# Patient Record
Sex: Female | Born: 1970 | Race: White | Hispanic: No | Marital: Married | Smoking: Never smoker
Health system: Southern US, Community
[De-identification: ages and names within clinical notes are randomized; demographics above are authoritative.]

## PROBLEM LIST (undated history)

## (undated) DIAGNOSIS — C189 Malignant neoplasm of colon, unspecified: Secondary | ICD-10-CM

## (undated) DIAGNOSIS — R51 Headache: Secondary | ICD-10-CM

## (undated) DIAGNOSIS — Z973 Presence of spectacles and contact lenses: Secondary | ICD-10-CM

## (undated) DIAGNOSIS — T8859XA Other complications of anesthesia, initial encounter: Secondary | ICD-10-CM

## (undated) DIAGNOSIS — G620 Drug-induced polyneuropathy: Secondary | ICD-10-CM

## (undated) DIAGNOSIS — K219 Gastro-esophageal reflux disease without esophagitis: Secondary | ICD-10-CM

## (undated) DIAGNOSIS — T4145XA Adverse effect of unspecified anesthetic, initial encounter: Secondary | ICD-10-CM

## (undated) DIAGNOSIS — R519 Headache, unspecified: Secondary | ICD-10-CM

## (undated) DIAGNOSIS — E559 Vitamin D deficiency, unspecified: Secondary | ICD-10-CM

## (undated) DIAGNOSIS — K716 Toxic liver disease with hepatitis, not elsewhere classified: Secondary | ICD-10-CM

## (undated) DIAGNOSIS — Z9889 Other specified postprocedural states: Secondary | ICD-10-CM

## (undated) DIAGNOSIS — N182 Chronic kidney disease, stage 2 (mild): Secondary | ICD-10-CM

## (undated) DIAGNOSIS — N939 Abnormal uterine and vaginal bleeding, unspecified: Secondary | ICD-10-CM

## (undated) DIAGNOSIS — T451X5A Adverse effect of antineoplastic and immunosuppressive drugs, initial encounter: Secondary | ICD-10-CM

## (undated) DIAGNOSIS — N840 Polyp of corpus uteri: Secondary | ICD-10-CM

## (undated) DIAGNOSIS — C801 Malignant (primary) neoplasm, unspecified: Secondary | ICD-10-CM

## (undated) DIAGNOSIS — E782 Mixed hyperlipidemia: Secondary | ICD-10-CM

## (undated) DIAGNOSIS — F411 Generalized anxiety disorder: Secondary | ICD-10-CM

## (undated) DIAGNOSIS — F419 Anxiety disorder, unspecified: Secondary | ICD-10-CM

## (undated) DIAGNOSIS — R112 Nausea with vomiting, unspecified: Secondary | ICD-10-CM

## (undated) HISTORY — PX: TONSILLECTOMY: SUR1361

## (undated) HISTORY — PX: BREAST BIOPSY: SHX20

## (undated) HISTORY — DX: Mixed hyperlipidemia: E78.2

## (undated) HISTORY — DX: Hypomagnesemia: E83.42

## (undated) HISTORY — PX: KNEE ARTHROSCOPY: SUR90

## (undated) HISTORY — DX: Toxic liver disease with hepatitis, not elsewhere classified: K71.6

## (undated) HISTORY — DX: Vitamin D deficiency, unspecified: E55.9

## (undated) HISTORY — DX: Anxiety disorder, unspecified: F41.9

## (undated) HISTORY — PX: CHOLECYSTECTOMY: SHX55

---

## 1998-02-08 ENCOUNTER — Other Ambulatory Visit: Admission: RE | Admit: 1998-02-08 | Discharge: 1998-02-08 | Payer: Self-pay | Admitting: Family Medicine

## 1999-10-07 ENCOUNTER — Encounter: Payer: Self-pay | Admitting: Gastroenterology

## 1999-10-07 ENCOUNTER — Ambulatory Visit (HOSPITAL_COMMUNITY): Admission: RE | Admit: 1999-10-07 | Discharge: 1999-10-07 | Payer: Self-pay | Admitting: Gastroenterology

## 2000-07-26 ENCOUNTER — Other Ambulatory Visit: Admission: RE | Admit: 2000-07-26 | Discharge: 2000-07-26 | Payer: Self-pay | Admitting: Family Medicine

## 2001-05-15 ENCOUNTER — Encounter: Payer: Self-pay | Admitting: Gastroenterology

## 2001-05-15 ENCOUNTER — Ambulatory Visit (HOSPITAL_COMMUNITY): Admission: RE | Admit: 2001-05-15 | Discharge: 2001-05-15 | Payer: Self-pay | Admitting: Gastroenterology

## 2001-07-02 ENCOUNTER — Observation Stay (HOSPITAL_COMMUNITY): Admission: RE | Admit: 2001-07-02 | Discharge: 2001-07-03 | Payer: Self-pay | Admitting: General Surgery

## 2001-09-19 ENCOUNTER — Other Ambulatory Visit: Admission: RE | Admit: 2001-09-19 | Discharge: 2001-09-19 | Payer: Self-pay | Admitting: Obstetrics & Gynecology

## 2002-04-09 ENCOUNTER — Inpatient Hospital Stay (HOSPITAL_COMMUNITY): Admission: AD | Admit: 2002-04-09 | Discharge: 2002-04-09 | Payer: Self-pay | Admitting: Obstetrics & Gynecology

## 2002-04-09 ENCOUNTER — Encounter: Payer: Self-pay | Admitting: Obstetrics and Gynecology

## 2002-04-20 ENCOUNTER — Inpatient Hospital Stay (HOSPITAL_COMMUNITY): Admission: AD | Admit: 2002-04-20 | Discharge: 2002-04-23 | Payer: Self-pay | Admitting: Obstetrics & Gynecology

## 2002-04-24 ENCOUNTER — Encounter: Admission: RE | Admit: 2002-04-24 | Discharge: 2002-05-24 | Payer: Self-pay | Admitting: Obstetrics & Gynecology

## 2002-05-27 ENCOUNTER — Other Ambulatory Visit: Admission: RE | Admit: 2002-05-27 | Discharge: 2002-05-27 | Payer: Self-pay | Admitting: Obstetrics & Gynecology

## 2006-01-12 ENCOUNTER — Encounter: Admission: RE | Admit: 2006-01-12 | Discharge: 2006-01-12 | Payer: Self-pay | Admitting: Obstetrics & Gynecology

## 2006-01-25 ENCOUNTER — Encounter: Admission: RE | Admit: 2006-01-25 | Discharge: 2006-01-25 | Payer: Self-pay | Admitting: Obstetrics & Gynecology

## 2011-02-23 ENCOUNTER — Other Ambulatory Visit: Payer: Self-pay | Admitting: Obstetrics & Gynecology

## 2011-02-23 DIAGNOSIS — Z1231 Encounter for screening mammogram for malignant neoplasm of breast: Secondary | ICD-10-CM

## 2011-03-06 ENCOUNTER — Ambulatory Visit
Admission: RE | Admit: 2011-03-06 | Discharge: 2011-03-06 | Disposition: A | Discharge status: Home / Self Care | Payer: PRIVATE HEALTH INSURANCE | Source: Ambulatory Visit | Attending: Obstetrics & Gynecology | Admitting: Obstetrics & Gynecology

## 2011-03-06 DIAGNOSIS — Z1231 Encounter for screening mammogram for malignant neoplasm of breast: Secondary | ICD-10-CM

## 2012-03-15 ENCOUNTER — Other Ambulatory Visit: Payer: Self-pay

## 2012-03-15 DIAGNOSIS — Z1231 Encounter for screening mammogram for malignant neoplasm of breast: Secondary | ICD-10-CM

## 2012-03-19 ENCOUNTER — Ambulatory Visit: Payer: PRIVATE HEALTH INSURANCE

## 2012-04-12 ENCOUNTER — Ambulatory Visit
Admission: RE | Admit: 2012-04-12 | Discharge: 2012-04-12 | Disposition: A | Discharge status: Home / Self Care | Payer: PRIVATE HEALTH INSURANCE | Source: Ambulatory Visit

## 2012-04-12 DIAGNOSIS — Z1231 Encounter for screening mammogram for malignant neoplasm of breast: Secondary | ICD-10-CM

## 2013-03-06 ENCOUNTER — Other Ambulatory Visit: Payer: Self-pay

## 2013-03-06 DIAGNOSIS — Z1231 Encounter for screening mammogram for malignant neoplasm of breast: Secondary | ICD-10-CM

## 2013-04-14 ENCOUNTER — Ambulatory Visit
Admission: RE | Admit: 2013-04-14 | Discharge: 2013-04-14 | Disposition: A | Discharge status: Home / Self Care | Payer: PRIVATE HEALTH INSURANCE | Source: Ambulatory Visit

## 2013-04-14 DIAGNOSIS — Z1231 Encounter for screening mammogram for malignant neoplasm of breast: Secondary | ICD-10-CM

## 2014-01-02 DIAGNOSIS — C801 Malignant (primary) neoplasm, unspecified: Secondary | ICD-10-CM

## 2014-01-02 DIAGNOSIS — C184 Malignant neoplasm of transverse colon: Secondary | ICD-10-CM

## 2014-01-02 HISTORY — DX: Malignant neoplasm of transverse colon: C18.4

## 2014-01-02 HISTORY — DX: Malignant (primary) neoplasm, unspecified: C80.1

## 2014-01-02 HISTORY — PX: COLON SURGERY: SHX602

## 2014-10-19 ENCOUNTER — Other Ambulatory Visit: Payer: Self-pay

## 2014-10-19 HISTORY — PX: COLONOSCOPY: SHX174

## 2014-10-19 LAB — HM COLONOSCOPY

## 2014-10-26 ENCOUNTER — Other Ambulatory Visit (HOSPITAL_COMMUNITY): Payer: Self-pay | Admitting: Gastroenterology

## 2014-10-26 DIAGNOSIS — C184 Malignant neoplasm of transverse colon: Secondary | ICD-10-CM

## 2014-10-30 ENCOUNTER — Encounter (HOSPITAL_COMMUNITY)
Admission: RE | Admit: 2014-10-30 | Discharge: 2014-10-30 | Disposition: A | Discharge status: Home / Self Care | Payer: PRIVATE HEALTH INSURANCE | Source: Ambulatory Visit | Attending: Gastroenterology | Admitting: Gastroenterology

## 2014-10-30 DIAGNOSIS — C184 Malignant neoplasm of transverse colon: Secondary | ICD-10-CM | POA: Insufficient documentation

## 2014-10-30 LAB — GLUCOSE, CAPILLARY: Glucose-Capillary: 101 mg/dL — ABNORMAL HIGH (ref 65–99)

## 2014-10-30 MED ORDER — FLUDEOXYGLUCOSE F - 18 (FDG) INJECTION
10.5000 | Freq: Once | INTRAVENOUS | Status: DC | PRN
  Administered 2014-10-30: 10.5 via INTRAVENOUS
  Filled 2014-10-30: qty 10.5

## 2014-11-04 ENCOUNTER — Ambulatory Visit (HOSPITAL_COMMUNITY): Payer: PRIVATE HEALTH INSURANCE

## 2014-11-08 DIAGNOSIS — C189 Malignant neoplasm of colon, unspecified: Secondary | ICD-10-CM | POA: Insufficient documentation

## 2014-11-16 HISTORY — PX: COLON SURGERY: SHX602

## 2014-12-11 ENCOUNTER — Ambulatory Visit (HOSPITAL_BASED_OUTPATIENT_CLINIC_OR_DEPARTMENT_OTHER): Payer: PRIVATE HEALTH INSURANCE

## 2014-12-11 ENCOUNTER — Encounter: Payer: Self-pay | Admitting: *Deleted

## 2014-12-11 ENCOUNTER — Ambulatory Visit (HOSPITAL_BASED_OUTPATIENT_CLINIC_OR_DEPARTMENT_OTHER): Payer: PRIVATE HEALTH INSURANCE | Admitting: Oncology

## 2014-12-11 ENCOUNTER — Encounter: Payer: Self-pay | Admitting: Oncology

## 2014-12-11 ENCOUNTER — Telehealth: Payer: Self-pay | Admitting: Oncology

## 2014-12-11 VITALS — BP 133/75 | HR 77 | Temp 98.8°F | Resp 16 | Ht 65.0 in | Wt 216.9 lb

## 2014-12-11 DIAGNOSIS — D509 Iron deficiency anemia, unspecified: Secondary | ICD-10-CM

## 2014-12-11 DIAGNOSIS — Z803 Family history of malignant neoplasm of breast: Secondary | ICD-10-CM | POA: Diagnosis not present

## 2014-12-11 DIAGNOSIS — C779 Secondary and unspecified malignant neoplasm of lymph node, unspecified: Secondary | ICD-10-CM

## 2014-12-11 DIAGNOSIS — C184 Malignant neoplasm of transverse colon: Secondary | ICD-10-CM | POA: Diagnosis not present

## 2014-12-11 DIAGNOSIS — Z808 Family history of malignant neoplasm of other organs or systems: Secondary | ICD-10-CM

## 2014-12-11 DIAGNOSIS — C189 Malignant neoplasm of colon, unspecified: Secondary | ICD-10-CM | POA: Insufficient documentation

## 2014-12-11 LAB — CBC WITH DIFFERENTIAL/PLATELET
BASO%: 0.6 % (ref 0.0–2.0)
Basophils Absolute: 0 10*3/uL (ref 0.0–0.1)
EOS ABS: 0.3 10*3/uL (ref 0.0–0.5)
EOS%: 5.7 % (ref 0.0–7.0)
HEMATOCRIT: 40.7 % (ref 34.8–46.6)
HEMOGLOBIN: 12.9 g/dL (ref 11.6–15.9)
LYMPH#: 2.3 10*3/uL (ref 0.9–3.3)
LYMPH%: 41.3 % (ref 14.0–49.7)
MCH: 25.7 pg (ref 25.1–34.0)
MCHC: 31.7 g/dL (ref 31.5–36.0)
MCV: 81.2 fL (ref 79.5–101.0)
MONO#: 0.5 10*3/uL (ref 0.1–0.9)
MONO%: 8.8 % (ref 0.0–14.0)
NEUT%: 43.6 % (ref 38.4–76.8)
NEUTROS ABS: 2.4 10*3/uL (ref 1.5–6.5)
PLATELETS: 279 10*3/uL (ref 145–400)
RBC: 5.01 10*6/uL (ref 3.70–5.45)
RDW: 13.9 % (ref 11.2–14.5)
WBC: 5.5 10*3/uL (ref 3.9–10.3)

## 2014-12-11 LAB — COMPREHENSIVE METABOLIC PANEL
ALBUMIN: 3.9 g/dL (ref 3.5–5.0)
ALT: 15 U/L (ref 0–55)
ANION GAP: 13 meq/L — AB (ref 3–11)
AST: 16 U/L (ref 5–34)
Alkaline Phosphatase: 89 U/L (ref 40–150)
BILIRUBIN TOTAL: 0.32 mg/dL (ref 0.20–1.20)
BUN: 6.8 mg/dL — ABNORMAL LOW (ref 7.0–26.0)
CHLORIDE: 108 meq/L (ref 98–109)
CO2: 21 mEq/L — ABNORMAL LOW (ref 22–29)
CREATININE: 1 mg/dL (ref 0.6–1.1)
Calcium: 9.6 mg/dL (ref 8.4–10.4)
EGFR: 73 mL/min/{1.73_m2} — AB (ref >90)
Glucose: 113 mg/dl (ref 70–140)
Potassium: 4.1 mEq/L (ref 3.5–5.1)
Sodium: 142 mEq/L (ref 136–145)
TOTAL PROTEIN: 7.9 g/dL (ref 6.4–8.3)

## 2014-12-11 NOTE — Progress Notes (Signed)
[place] New Patient Consult   Referring MD: Lenise Arena   Brenda Becker 44 y.o.  [birth year]    Reason for Referral: Colon cancer   HPI: She reports being diagnosed with anemia at the time of a company wellness check in May of this year. She was referred to her gynecologist and the anemia improved with iron therapy. She was found to have a Hemoccult positive stool at another wellness check and was referred to Dr. Lyndel Safe. A colonoscopy revealed a mass in the mid transverse colon (we do not have the colonoscopy report available today). The pathology from a biopsy ([id number]) revealed ulcerated colonic mucosa with at least intramucosal adenocarcinoma. She underwent CT scans at [facility] ( I do not have these reports available today) and a PET scan 10/30/2014 at [facility] confirmed an annular hypermetabolic mass in the transverse colon consistent with a known primary colon cancer. Mild adjacent pericolonic lymphadenopathy was seen with hypermetabolism consistent with lymph node metastases. A right uterine fibroid had mild metabolic activity. No evidence of distant metastatic disease.  She was referred to Dr. Morton Stall at [facility] and was taken to the operating room 11/16/2014. A bulky tumor was noted just distal to the mid point of the transverse colon with bulky lymph nodes in the mesentery. A plaque-like area in the peritoneum was adjacent to a hard lymph node felt to potentially represent a lymph node breaking through the visceral peritoneum overlying the mesentery. Anastomosis was created between the proximal transverse and sigmoid colon.  The pathology at [facility] [phone]) confirmed a well-differentiated adenocarcinoma extending into the pericolonic fat. Tumor extended to within 0.1 cm of the serosa. Resection margins were negative. Tenderness 17 lymph nodes are positive for metastatic adenocarcinoma. The separate sutured serosal area identified at the time of  surgery represented a metastatic lymph node. Lymphovascular and perineural invasion were not identified. Tumor deposits were present.  Her bowels are functioning following surgery. She has noted constipation since beginning iron therapy.  Past medical history: 1. G2 P1, 1 miscarriage   Past surgical history: 1. Cholecystectomy age 11 2. Tonsillectomy age 62 3.  Right knee arthroscopy for a meniscus tear   Medications: Reviewed  Allergies: Not on File  Family history: Her paternal grandmother had breast cancer. A paternal aunt had head and neck cancer. A paternal aunt had ovarian cancer in her 50s. No other family history of cancer.  Social History:   She lives with her husband and daughter in [place]. She works as a Engineer, building services. She does not use tobacco or alcohol. No transfusion history. No risk factor for HIV or hepatitis.     ROS:   Positives include: Blood in the stool on one occasion prior to the colonoscopy, exertional dyspnea when going up steps at work prior to being diagnosed with colon cancer  A complete ROS was otherwise negative.  Physical Exam:  Blood pressure 133/75, pulse 77, temperature 98.8 F (37.1 C), temperature source Oral, resp. rate 16, height _0  (1.651 m), weight 216 lb 14.4 oz (98.385 kg), SpO2 100 %.  HEENT: Oropharynx without visible mass, neck without mass Lungs: Clear bilaterally Cardiac: Regular rate and rhythm Abdomen: No hepatosplenomegaly, no mass, nontender, healed surgical incision  Vascular: No leg edema Lymph nodes: No cervical, supraclavicular, axillary, or inguinal nodes Neurologic: Alert and oriented, the motor exam appears intact in the upper and lower extremities Skin: No rash, multiple tattoos Musculoskeletal: No spine tenderness   LAB:  CBC  Lab Results  Component Value Date   WBC 5.5 12/11/2014   HGB 12.9 12/11/2014   HCT 40.7 12/11/2014   MCV 81.2 12/11/2014   PLT 279 12/11/2014   NEUTROABS 2.4 12/11/2014         Imaging:  As per history of present illness, PET scan from 10/30/2014 reviewed   Assessment/Plan:   1. 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   Microcytic anemia-likely iron deficiency anemia secondary to #1 and ongoing menses  3.   Status post cholecystectomy  4.    Family history of breast and ovarian cancer   Disposition:   Brenda Becker has been diagnosed with stage III colon cancer. I discussed the prognosis and reviewed the details of the surgical pathology report with her today. She understands the colon surgery may have been curative, but she is at high risk of developing recurrent colon cancer over the next few years. I explained the data supporting adjuvant chemotherapy in this setting. I recommend adjuvant 5-fluorouracil and oxaliplatin chemotherapy.  We reviewed the treatment schedule and toxicities associated with the FOLFOX and CAPOX regimens. She feels most comfortable with CAPOX. I reviewed the potential toxicities associated with this regimen including the chance for nausea/vomiting, mucositis, diarrhea, an allergic reaction, and hematologic toxicity. We discussed the sun sensitivity, hyperpigmentation, and hand/foot syndrome associated with capecitabine. We reviewed the various types of neuropathy seen with oxaliplatin. She agrees to proceed.  Brenda Becker will be referred for placement of a Port-A-Cath prior to beginning a first cycle of CAPOX on 12/25/2014. She will return for an office visit and CBC on 01/07/2014. We obtained a baseline CEA today.  She will be referred to the genetics screening clinic. We will request MSI and mismatch repair protein IHC testing on the resected colon cancer.  We discussed diet and exercise  maneuvers that may decrease the chance of developing colon cancer. We also discussed preliminary data suggesting a benefit for the use of aspirin therapy in patients with resected colon cancer, though this is not yet a standard recommendation.    Approximately 50 minutes were spent with the patient today. The majority of the time was used for counseling and coordination of care.  [place], [place] 12/11/2014, 4:20 PM

## 2014-12-11 NOTE — Telephone Encounter (Signed)
Gave and pritned appt sched and avs for pt for DEc and Jan

## 2014-12-11 NOTE — Progress Notes (Signed)
Oncology Nurse Navigator Documentation  Oncology Nurse Navigator Flowsheets 12/11/2014  Referral date to RadOnc/MedOnc 12/11/2014  Navigator Encounter Type Initial MedOnc  Patient Visit Type Medonc  Treatment Phase Treatment  Barriers/Navigation Needs Education  Education Understanding Cancer/ Treatment Options;Preparing for Upcoming Surgery/ Treatment;Newly Diagnosed Cancer Education  Interventions Education Method  Education Method Verbal;Written  Support Groups/Services GI;Other  Time Spent with Patient 15   Met with patient and her sister briefly after her  new patient visit. Explained the role of the GI Nurse Navigator and provided New Patient Packet with information on: 1. Colon cancer and PAC  2. Support groups 3. Advanced Directives 4. Fall Safety Plan Answered questions, reviewed current treatment plan using TEACH back and provided emotional support. Went over her CBC results with her and instructed her per Dr. Sherrill to continue her ferrous sulfate daily till she gets tx started. She is recovering, but he wants her on it longer. Encouraged her to call with any questions or concerns she may have.  Susan Coward, RN, BSN GI Oncology Navigator [facility] 

## 2014-12-12 LAB — CEA

## 2014-12-14 ENCOUNTER — Other Ambulatory Visit: Payer: Self-pay | Admitting: Oncology

## 2014-12-14 MED ORDER — CAPECITABINE 500 MG PO TABS: 1000.0000 mg/m2 | ORAL_TABLET | Freq: Two times a day (BID) | ORAL | Status: DC

## 2014-12-15 ENCOUNTER — Encounter: Payer: Self-pay | Admitting: *Deleted

## 2014-12-15 NOTE — Progress Notes (Signed)
Faxed request to [facility] [phone] requesting MSI/IHC testing on case #[id number], dated 11/20/14 at request of Dr. [name]

## 2014-12-17 ENCOUNTER — Encounter: Payer: Self-pay | Admitting: *Deleted

## 2014-12-17 ENCOUNTER — Telehealth: Payer: Self-pay | Admitting: *Deleted

## 2014-12-17 ENCOUNTER — Telehealth: Payer: Self-pay | Admitting: Pharmacist

## 2014-12-17 ENCOUNTER — Other Ambulatory Visit: Payer: Self-pay | Admitting: Oncology

## 2014-12-17 ENCOUNTER — Ambulatory Visit: Payer: PRIVATE HEALTH INSURANCE

## 2014-12-17 ENCOUNTER — Encounter: Payer: Self-pay | Admitting: Pharmacist

## 2014-12-17 MED ORDER — CAPECITABINE 500 MG PO TABS: 1000.0000 mg/m2 | ORAL_TABLET | Freq: Two times a day (BID) | ORAL | Status: DC

## 2014-12-17 NOTE — Telephone Encounter (Signed)
Tc to [place] to give them ICD 10 code for filling Xeloda.

## 2014-12-17 NOTE — Progress Notes (Signed)
Consent for xeloda use signed in education class.

## 2014-12-17 NOTE — Progress Notes (Signed)
Oral Chemotherapy Pharmacist Encounter   I spoke with patient for overview of new oral chemotherapy medication: Xeloda. Pt is doing well. The prescriptions have been sent to the [facility] for benefit analysis and approval.   Counseled patient on administration, dosing, side effects, safe handling, and monitoring. Side effects include but not limited to: Fatigue, diarrhea, mouth sores, and skin issues such as hand/foot syndrome.  Brenda Becker voiced understanding and appreciation.   All questions answered.  Will follow up with patient regarding insurance and pharmacy. Will follow up in 1-2 weeks for adherence and toxicity management.   Thank you,  Montel Clock, PharmD, [facility]

## 2014-12-17 NOTE — Telephone Encounter (Signed)
12/15 - Xeloda Rx requires specialty pharmacy per Insurance. Faxed to [facility].

## 2014-12-18 ENCOUNTER — Other Ambulatory Visit: Payer: Self-pay | Admitting: Radiology

## 2014-12-18 ENCOUNTER — Telehealth: Payer: Self-pay | Admitting: *Deleted

## 2014-12-18 DIAGNOSIS — C184 Malignant neoplasm of transverse colon: Secondary | ICD-10-CM

## 2014-12-18 MED ORDER — LIDOCAINE-PRILOCAINE 2.5-2.5 % EX CREA: TOPICAL_CREAM | CUTANEOUS | Status: DC

## 2014-12-18 MED ORDER — PROCHLORPERAZINE MALEATE 10 MG PO TABS: 10.0000 mg | ORAL_TABLET | Freq: Four times a day (QID) | ORAL | Status: DC | PRN

## 2014-12-18 NOTE — Telephone Encounter (Signed)
Left message on voicemail for pt to call office to review medications: Pt should discontinue Prilosec while on Xeloda. OK to take Tums, Zantac or Pepcid PRN.  Dr. [name] ordered Compazine PRN for nausea. EMLA cream for port. Prescriptions e-scribed to local pharmacy.

## 2014-12-21 ENCOUNTER — Encounter (HOSPITAL_COMMUNITY): Payer: Self-pay

## 2014-12-21 ENCOUNTER — Other Ambulatory Visit: Payer: Self-pay | Admitting: Radiology

## 2014-12-21 ENCOUNTER — Ambulatory Visit (HOSPITAL_COMMUNITY)
Admission: RE | Admit: 2014-12-21 | Discharge: 2014-12-21 | Disposition: A | Discharge status: Home / Self Care | Payer: PRIVATE HEALTH INSURANCE | Source: Ambulatory Visit | Attending: Oncology | Admitting: Oncology

## 2014-12-21 DIAGNOSIS — Z538 Procedure and treatment not carried out for other reasons: Secondary | ICD-10-CM | POA: Insufficient documentation

## 2014-12-21 DIAGNOSIS — C184 Malignant neoplasm of transverse colon: Secondary | ICD-10-CM

## 2014-12-21 HISTORY — DX: Malignant (primary) neoplasm, unspecified: C80.1

## 2014-12-21 MED ORDER — SODIUM CHLORIDE 0.9 % IV SOLN
INTRAVENOUS | Status: DC
Start: 2014-12-21 — End: 2014-12-22

## 2014-12-21 MED ORDER — CEFAZOLIN SODIUM-DEXTROSE 2-3 GM-% IV SOLR: 2.0000 g | Freq: Once | INTRAVENOUS | Status: DC

## 2014-12-21 NOTE — Telephone Encounter (Signed)
Pt confirmed she did receive information re: medication.  "Brenda Becker spoke to me in chemo class about stopping Prilosec"  Pt has also p/u compazine and emla cream "just waiting to hear about the chemo pill"  Pt verbalized understanding to call if any questions.

## 2014-12-21 NOTE — Progress Notes (Signed)
Due to emergent add on cases with over 2 hour delay in schedule, the patient was given the option to reschedule her port a catheter placement for 12/20 at 9 am, she would like to reschedule. She was given NPO instructions and will arrive at 7 am for 9 am port placement procedure.    Tsosie Billing PA-C Interventional Radiology  12/21/14  1:43 PM

## 2014-12-22 ENCOUNTER — Encounter: Payer: Self-pay | Admitting: Oncology

## 2014-12-22 ENCOUNTER — Other Ambulatory Visit: Payer: Self-pay | Admitting: Oncology

## 2014-12-22 ENCOUNTER — Encounter (HOSPITAL_COMMUNITY): Payer: Self-pay

## 2014-12-22 ENCOUNTER — Ambulatory Visit (HOSPITAL_COMMUNITY)
Admission: RE | Admit: 2014-12-22 | Discharge: 2014-12-22 | Disposition: A | Discharge status: Home / Self Care | Payer: PRIVATE HEALTH INSURANCE | Source: Ambulatory Visit | Attending: Oncology | Admitting: Oncology

## 2014-12-22 DIAGNOSIS — Z79899 Other long term (current) drug therapy: Secondary | ICD-10-CM | POA: Diagnosis not present

## 2014-12-22 DIAGNOSIS — C184 Malignant neoplasm of transverse colon: Secondary | ICD-10-CM

## 2014-12-22 DIAGNOSIS — C189 Malignant neoplasm of colon, unspecified: Secondary | ICD-10-CM | POA: Insufficient documentation

## 2014-12-22 HISTORY — DX: Adverse effect of unspecified anesthetic, initial encounter: T41.45XA

## 2014-12-22 HISTORY — DX: Other specified postprocedural states: R11.2

## 2014-12-22 HISTORY — DX: Other specified postprocedural states: Z98.890

## 2014-12-22 HISTORY — PX: PORTA CATH INSERTION: CATH118285

## 2014-12-22 HISTORY — DX: Other complications of anesthesia, initial encounter: T88.59XA

## 2014-12-22 LAB — CBC WITH DIFFERENTIAL/PLATELET
BASOS ABS: 0 10*3/uL (ref 0.0–0.1)
Basophils Relative: 1 %
Eosinophils Absolute: 0.3 10*3/uL (ref 0.0–0.7)
Eosinophils Relative: 5 %
HCT: 42.1 % (ref 36.0–46.0)
HEMOGLOBIN: 13.5 g/dL (ref 12.0–15.0)
LYMPHS ABS: 2.5 10*3/uL (ref 0.7–4.0)
LYMPHS PCT: 39 %
MCH: 25.9 pg — AB (ref 26.0–34.0)
MCHC: 32.1 g/dL (ref 30.0–36.0)
MCV: 80.7 fL (ref 78.0–100.0)
Monocytes Absolute: 0.6 10*3/uL (ref 0.1–1.0)
Monocytes Relative: 9 %
NEUTROS PCT: 46 %
Neutro Abs: 2.9 10*3/uL (ref 1.7–7.7)
PLATELETS: 251 10*3/uL (ref 150–400)
RBC: 5.22 MIL/uL — AB (ref 3.87–5.11)
RDW: 14.2 % (ref 11.5–15.5)
WBC: 6.3 10*3/uL (ref 4.0–10.5)

## 2014-12-22 LAB — PROTIME-INR
INR: 0.94 (ref 0.00–1.49)
PROTHROMBIN TIME: 12.8 s (ref 11.6–15.2)

## 2014-12-22 MED ORDER — CEFAZOLIN SODIUM-DEXTROSE 2-3 GM-% IV SOLR
2.0000 g | Freq: Once | INTRAVENOUS | Status: AC
  Administered 2014-12-22: 2 g via INTRAVENOUS

## 2014-12-22 MED ORDER — MIDAZOLAM HCL 2 MG/2ML IJ SOLN
INTRAMUSCULAR | Status: AC | PRN
  Administered 2014-12-22 (×4): 1 mg via INTRAVENOUS

## 2014-12-22 MED ORDER — HEPARIN SOD (PORK) LOCK FLUSH 100 UNIT/ML IV SOLN
INTRAVENOUS | Status: AC
  Filled 2014-12-22: qty 5

## 2014-12-22 MED ORDER — LIDOCAINE-EPINEPHRINE 2 %-1:100000 IJ SOLN
INTRAMUSCULAR | Status: AC
  Filled 2014-12-22: qty 1

## 2014-12-22 MED ORDER — FENTANYL CITRATE (PF) 100 MCG/2ML IJ SOLN
INTRAMUSCULAR | Status: AC
  Filled 2014-12-22: qty 2

## 2014-12-22 MED ORDER — SODIUM CHLORIDE 0.9 % IV SOLN
INTRAVENOUS | Status: DC
  Administered 2014-12-22: 08:00:00 via INTRAVENOUS

## 2014-12-22 MED ORDER — FENTANYL CITRATE (PF) 100 MCG/2ML IJ SOLN
INTRAMUSCULAR | Status: AC | PRN
  Administered 2014-12-22: 25 ug via INTRAVENOUS
  Administered 2014-12-22: 50 ug via INTRAVENOUS
  Administered 2014-12-22: 25 ug via INTRAVENOUS

## 2014-12-22 MED ORDER — MIDAZOLAM HCL 2 MG/2ML IJ SOLN
INTRAMUSCULAR | Status: AC
  Filled 2014-12-22: qty 4

## 2014-12-22 MED ORDER — CEFAZOLIN SODIUM-DEXTROSE 2-3 GM-% IV SOLR
INTRAVENOUS | Status: AC
  Filled 2014-12-22: qty 50

## 2014-12-22 MED ORDER — HEPARIN SOD (PORK) LOCK FLUSH 100 UNIT/ML IV SOLN
INTRAVENOUS | Status: AC | PRN
  Administered 2014-12-22: 500 [IU]

## 2014-12-22 NOTE — H&P (Signed)
Chief Complaint: Patient was seen in consultation today for port placement at the request of Sherrill,Gary B  Referring Physician(s): Sherrill,Gary B  History of Present Illness: Brenda Becker is a 44 y.o. female with colon cancer. She is set to start chemotherapy later this week and is scheduled for port placement today PMHx, meds, labs, allergies reviewed. Has been NPO this morning Feels well, no recent fevers chills, illness  Past Medical History  Diagnosis Date  . Cancer ([place])     colon ca  . Complication of anesthesia     N/V  . PONV (postoperative nausea and vomiting)     vomit with Gallbladder surgery    Past Surgical History  Procedure Laterality Date  . Tonsillectomy    . Colon surgery    . Cholecystectomy    . Knee arthroscopy Right     Allergies: Review of patient's allergies indicates no known allergies.  Medications: Prior to Admission medications   Medication Sig Start Date End Date Taking? Authorizing Provider  docusate sodium (COLACE) 100 MG capsule Take 100 mg by mouth daily as needed for mild constipation.   Yes Historical Provider, MD  norethindrone-ethinyl estradiol (JUNEL FE,GILDESS FE,LOESTRIN FE) 1-20 MG-MCG tablet Take 1 tablet by mouth daily.   Yes Historical Provider, MD  polyethylene glycol (MIRALAX / GLYCOLAX) packet Take 17 g by mouth as needed. 11/21/14  Yes Historical Provider, MD  atorvastatin (LIPITOR) 20 MG tablet Take 20 mg by mouth. At bettime    Historical Provider, MD  capecitabine (XELODA) 500 MG tablet Take 4 tablets (2,000 mg total) by mouth 2 (two) times daily after a meal. Take for 14 days then off for 7 days. Repeat every 21 days 12/25/14   Ladell Pier, MD  ferrous sulfate 325 (65 FE) MG tablet Take 325 mg by mouth. 3 times a week    Historical Provider, MD  lidocaine-prilocaine (EMLA) cream Apply to port site one hour prior to use. Do not rub in. Cover with plastic. 12/18/14   Ladell Pier, MD  prochlorperazine  (COMPAZINE) 10 MG tablet Take 1 tablet (10 mg total) by mouth every 6 (six) hours as needed for nausea or vomiting. 12/18/14   Ladell Pier, MD  ranitidine (ZANTAC) 150 MG capsule Take 150 mg by mouth 1 day or 1 dose.    Historical Provider, MD     History reviewed. No pertinent family history.  Social History   Social History  . Marital Status: Married    Spouse Name: N/A  . Number of Children: N/A  . Years of Education: N/A   Social History Main Topics  . Smoking status: Never Smoker   . Smokeless tobacco: None  . Alcohol Use: No  . Drug Use: None  . Sexual Activity: Not Asked   Other Topics Concern  . None   Social History Narrative     Review of Systems: A 12 point ROS discussed and pertinent positives are indicated in the HPI above.  All other systems are negative.  Review of Systems  Vital Signs: BP 134/89 mmHg  Pulse 73  Temp(Src) 99.1 F (37.3 C) (Oral)  Resp 16  SpO2 100%  LMP 11/26/2014  Physical Exam  Constitutional: She is oriented to person, place, and time. She appears well-developed and well-nourished. No distress.  HENT:  Head: Normocephalic.  Mouth/Throat: Oropharynx is clear and moist.  Neck: Normal range of motion. No tracheal deviation present.  Cardiovascular: Normal rate, regular rhythm and normal heart  sounds.   Pulmonary/Chest: Effort normal and breath sounds normal. No respiratory distress.  Abdominal: Soft. She exhibits no distension. There is no tenderness.  Neurological: She is alert and oriented to person, place, and time.  Psychiatric: She has a normal mood and affect. Judgment normal.    Mallampati Score:  MD Evaluation Airway: WNL Heart: WNL Abdomen: WNL Chest/ Lungs: WNL ASA  Classification: 2 Mallampati/Airway Score: One  Imaging: No results found.  Labs:  CBC:  Recent Labs  12/11/14 1558 12/22/14 0725  WBC 5.5 6.3  HGB 12.9 13.5  HCT 40.7 42.1  PLT 279 251    COAGS:  Recent Labs  12/22/14 0725    INR 0.94    BMP:  Recent Labs  12/11/14 1558  NA 142  K 4.1  CO2 21*  GLUCOSE 113  BUN 6.8*  CALCIUM 9.6  CREATININE 1.0    LIVER FUNCTION TESTS:  Recent Labs  12/11/14 1558  BILITOT 0.32  AST 16  ALT 15  ALKPHOS 89  PROT 7.9  ALBUMIN 3.9    TUMOR MARKERS:  Recent Labs  12/11/14 1557  CEA <0.5    Assessment and Plan: Colon cancer For Port placement Labs reviewed, ok Risks and Benefits discussed with the patient including, but not limited to bleeding, infection, pneumothorax, or fibrin sheath development and need for additional procedures. All of the patient's questions were answered, patient is agreeable to proceed. Consent signed and in chart.    Thank you for this interesting consult.  A copy of this report was sent to the requesting provider on this date.  SignedAscencion Dike 12/22/2014, 8:23 AM   I spent a total of 18 minutes in face to face in clinical consultation, greater than 50% of which was counseling/coordinating care for port palcement       \

## 2014-12-22 NOTE — Progress Notes (Signed)
Called pt. To advise FMLA paperwork ready for pick-up, no answer left message to call me back. Paperwork will be in the front lobby. Forwarded to medical records.

## 2014-12-22 NOTE — Progress Notes (Signed)
Faxed xeloda pa form to [facility] @ [phone] phone # [phone]

## 2014-12-22 NOTE — Progress Notes (Signed)
Pt actually discharged at 91 from short stay rm 11with husband driving.

## 2014-12-22 NOTE — Procedures (Signed)
R IJ Port cathter placement with US and fluoroscopy No complication No blood loss. See complete dictation in [facility] PACS.  

## 2014-12-22 NOTE — Discharge Instructions (Signed)
Moderate Conscious Sedation, Adult Sedation is the use of medicines to promote relaxation and relieve discomfort and anxiety. Moderate conscious sedation is a type of sedation. Under moderate conscious sedation you are less alert than normal but are still able to respond to instructions or stimulation. Moderate conscious sedation is used during short medical and dental procedures. It is milder than deep sedation or general anesthesia and allows you to return to your regular activities sooner. LET [facility] CARE PROVIDER KNOW ABOUT:   Any allergies you have.  All medicines you are taking, including vitamins, herbs, eye drops, creams, and over-the-counter medicines.  Use of steroids (by mouth or creams).  Previous problems you or members of your family have had with the use of anesthetics.  Any blood disorders you have.  Previous surgeries you have had.  Medical conditions you have.  Possibility of pregnancy, if this applies.  Use of cigarettes, alcohol, or illegal drugs. RISKS AND COMPLICATIONS Generally, this is a safe procedure. However, as with any procedure, problems can occur. Possible problems include:  Oversedation.  Trouble breathing on your own. You may need to have a breathing tube until you are awake and breathing on your own.  Allergic reaction to any of the medicines used for the procedure. BEFORE THE PROCEDURE  You may have blood tests done. These tests can help show how well your kidneys and liver are working. They can also show how well your blood clots.  A physical exam will be done.  Only take medicines as directed by your health care provider. You may need to stop taking medicines (such as blood thinners, aspirin, or nonsteroidal anti-inflammatory drugs) before the procedure.   Do not eat or drink at least 6 hours before the procedure or as directed by your health care provider.  Arrange for a responsible adult, family member, or friend to take you home  after the procedure. He or she should stay with you for at least 24 hours after the procedure, until the medicine has worn off. PROCEDURE   An intravenous (IV) catheter will be inserted into one of your veins. Medicine will be able to flow directly into your body through this catheter. You may be given medicine through this tube to help prevent pain and help you relax.  The medical or dental procedure will be done. AFTER THE PROCEDURE  You will stay in a recovery area until the medicine has worn off. Your blood pressure and pulse will be checked.   Depending on the procedure you had, you may be allowed to go home when you can tolerate liquids and your pain is under control.   This information is not intended to replace advice given to you by your health care provider. Make sure you discuss any questions you have with your health care provider.   Document Released: 09/13/2000 Document Revised: 01/09/2014 Document Reviewed: 08/26/2012 Elsevier Interactive Patient Education 2016 [employer]  Moderate Conscious Sedation, Adult, Care After Refer to this sheet in the next few weeks. These instructions provide you with information on caring for yourself after your procedure. Your health care provider may also give you more specific instructions. Your treatment has been planned according to current medical practices, but problems sometimes occur. Call your health care provider if you have any problems or questions after your procedure. WHAT TO EXPECT AFTER THE PROCEDURE  After your procedure:  You may feel sleepy, clumsy, and have poor balance for several hours.  Vomiting may occur if you eat too  soon after the procedure. HOME CARE INSTRUCTIONS  Do not participate in any activities where you could become injured for at least 24 hours. Do not:  Drive.  Swim.  Ride a bicycle.  Operate heavy machinery.  Cook.  Use power tools.  Climb ladders.  Work from a high place.  Do not  make important decisions or sign legal documents until you are improved.  If you vomit, drink water, juice, or soup when you can drink without vomiting. Make sure you have little or no nausea before eating solid foods.  Only take over-the-counter or prescription medicines for pain, discomfort, or fever as directed by your health care provider.  Make sure you and your family fully understand everything about the medicines given to you, including what side effects may occur.  You should not drink alcohol, take sleeping pills, or take medicines that cause drowsiness for at least 24 hours.  If you smoke, do not smoke without supervision.  If you are feeling better, you may resume normal activities 24 hours after you were sedated.  Keep all appointments with your health care provider. SEEK MEDICAL CARE IF:  Your skin is pale or bluish in color.  You continue to feel nauseous or vomit.  Your pain is getting worse and is not helped by medicine.  You have bleeding or swelling.  You are still sleepy or feeling clumsy after 24 hours. SEEK IMMEDIATE MEDICAL CARE IF:  You develop a rash.  You have difficulty breathing.  You develop any type of allergic problem.  You have a fever. MAKE SURE YOU:  Understand these instructions.  Will watch your condition.  Will get help right away if you are not doing well or get worse.   This information is not intended to replace advice given to you by your health care provider. Make sure you discuss any questions you have with your health care provider.   Document Released: 10/09/2012 Document Revised: 01/09/2014 Document Reviewed: 10/09/2012 Elsevier Interactive Patient Education 2016 [place] Insertion, Care After Refer to this sheet in the next few weeks. These instructions provide you with information on caring for yourself after your procedure. Your health care provider may also give you more specific instructions.  Your treatment has been planned according to current medical practices, but problems sometimes occur. Call your health care provider if you have any problems or questions after your procedure. WHAT TO EXPECT AFTER THE PROCEDURE After your procedure, it is typical to have the following:   Discomfort at the port insertion site. Ice packs to the area will help.  Bruising on the skin over the port. This will subside in 3-4 days. HOME CARE INSTRUCTIONS  After your port is placed, you will get a manufacturer's information card. The card has information about your port. Keep this card with you at all times.   Know what kind of port you have. There are many types of ports available.   Wear a medical alert bracelet in case of an emergency. This can help alert health care workers that you have a port.   The port can stay in for as long as your health care provider believes it is necessary.   A home health care nurse may give medicines and take care of the port.   You or a family member can get special training and directions for giving medicine and taking care of the port at home.  SEEK MEDICAL CARE IF:   Your port does not  flush or you are unable to get a blood return.   You have a fever or chills. SEEK IMMEDIATE MEDICAL CARE IF:  You have new fluid or pus coming from your incision.   You notice a bad smell coming from your incision site.   You have swelling, pain, or more redness at the incision or port site.   You have chest pain or shortness of breath.   This information is not intended to replace advice given to you by your health care provider. Make sure you discuss any questions you have with your health care provider.   Document Released: 10/09/2012 Document Revised: 12/24/2012 Document Reviewed: 10/09/2012 Elsevier Interactive Patient Education 2016 [place] An implanted port is a type of central line that is placed under the skin.  Central lines are used to provide IV access when treatment or nutrition needs to be given through a person's veins. Implanted ports are used for long-term IV access. An implanted port may be placed because:   You need IV medicine that would be irritating to the small veins in your hands or arms.   You need long-term IV medicines, such as antibiotics.   You need IV nutrition for a long period.   You need frequent blood draws for lab tests.   You need dialysis.  Implanted ports are usually placed in the chest area, but they can also be placed in the upper arm, the abdomen, or the leg. An implanted port has two main parts:   Reservoir. The reservoir is round and will appear as a small, raised area under your skin. The reservoir is the part where a needle is inserted to give medicines or draw blood.   Catheter. The catheter is a thin, flexible tube that extends from the reservoir. The catheter is placed into a large vein. Medicine that is inserted into the reservoir goes into the catheter and then into the vein.  HOW WILL I CARE FOR MY INCISION SITE? Do not get the incision site wet. Bathe or shower as directed by your health care provider.  HOW IS MY PORT ACCESSED? Special steps must be taken to access the port:   Before the port is accessed, a numbing cream can be placed on the skin. This helps numb the skin over the port site.   Your health care provider uses a sterile technique to access the port.  Your health care provider must put on a mask and sterile gloves.  The skin over your port is cleaned carefully with an antiseptic and allowed to dry.  The port is gently pinched between sterile gloves, and a needle is inserted into the port.  Only "non-coring" port needles should be used to access the port. Once the port is accessed, a blood return should be checked. This helps ensure that the port is in the vein and is not clogged.   If your port needs to remain accessed for a  constant infusion, a clear (transparent) bandage will be placed over the needle site. The bandage and needle will need to be changed every week, or as directed by your health care provider.   Keep the bandage covering the needle clean and dry. Do not get it wet. Follow your health care provider's instructions on how to take a shower or bath while the port is accessed.   If your port does not need to stay accessed, no bandage is needed over the port.  WHAT IS FLUSHING? Flushing helps  keep the port from getting clogged. Follow your health care provider's instructions on how and when to flush the port. Ports are usually flushed with saline solution or a medicine called heparin. The need for flushing will depend on how the port is used.   If the port is used for intermittent medicines or blood draws, the port will need to be flushed:   After medicines have been given.   After blood has been drawn.   As part of routine maintenance.   If a constant infusion is running, the port may not need to be flushed.  HOW LONG WILL MY PORT STAY IMPLANTED? The port can stay in for as long as your health care provider thinks it is needed. When it is time for the port to come out, surgery will be done to remove it. The procedure is similar to the one performed when the port was put in.  WHEN SHOULD I SEEK IMMEDIATE MEDICAL CARE? When you have an implanted port, you should seek immediate medical care if:   You notice a bad smell coming from the incision site.   You have swelling, redness, or drainage at the incision site.   You have more swelling or pain at the port site or the surrounding area.   You have a fever that is not controlled with medicine.   This information is not intended to replace advice given to you by your health care provider. Make sure you discuss any questions you have with your health care provider.   Document Released: 12/19/2004 Document Revised: 10/09/2012 Document  Reviewed: 08/26/2012 Elsevier Interactive Patient Education [employer].

## 2014-12-23 ENCOUNTER — Telehealth: Payer: Self-pay

## 2014-12-23 ENCOUNTER — Encounter: Payer: Self-pay | Admitting: Oncology

## 2014-12-23 NOTE — Telephone Encounter (Signed)
Pt called stating that when she went to [facility] outpatient pharmacy they told her Dr [name] needs to call her insurance co. [employer]. I followed up on this and her Rx went to [facility] and her PA form is in review. I called pt back with this information

## 2014-12-23 NOTE — Progress Notes (Signed)
Called pt to introduce myself as Estate manager/land agent and to see if she had any financial questions or concerns. No answer, left voicemail with contact information if she would like to return call.

## 2014-12-25 ENCOUNTER — Other Ambulatory Visit: Payer: Self-pay | Admitting: Oncology

## 2014-12-25 ENCOUNTER — Telehealth: Payer: Self-pay | Admitting: *Deleted

## 2014-12-25 ENCOUNTER — Ambulatory Visit: Payer: PRIVATE HEALTH INSURANCE

## 2014-12-25 NOTE — Progress Notes (Signed)
Patient has not received Xeloda delivery as of yet. MD/RN working with outpatient pharmacy to override and dispense doses to cover patient until her delivery is received at home. Due to wait time and as per discussion with Dr. Gearldine Shown desk RN, patient rescheduled to Wednesday, 12/29 at 2 pm. New schedule given. Delivery is supposed to occur on Wednesday. She is told to call [facility] if she does not have Xeloda by then as she lives 30 minutes away and wants to avoid this situation in the future. Rn reviewed EMLA cream application for future PAC access. She verbalizes understanding and discharged home NAD with spouse.

## 2014-12-25 NOTE — Telephone Encounter (Signed)
Pt called to [facility] states she does not have the " 4 pills I am supposed to take in the morning and then in the evening - do I still need to come in for my treatment today ?"  Noted pt is to receive oxiplatin with po xeloda.  This RN informed pt to plan on coming in as scheduled.  MD immediately notified- collaborative nurse will follow up per concern.

## 2014-12-29 ENCOUNTER — Telehealth: Payer: Self-pay | Admitting: *Deleted

## 2014-12-29 ENCOUNTER — Telehealth: Payer: Self-pay | Admitting: Pharmacist

## 2014-12-29 NOTE — Telephone Encounter (Signed)
  Oncology Nurse Navigator Documentation    Navigator Encounter Type: Telephone (12/29/14 0856) : Spoke with [facility] to follow up on status of IHC/MSI testing requested on 12/22/14. She will check and call back w/status.

## 2014-12-29 NOTE — Telephone Encounter (Signed)
12/27 - Spoke with [facility]. Rx for Xeloda has been shipped out today and will be delivered on 12/30/14.  Thank you,  Montel Clock, PharmD, [facility]

## 2014-12-30 ENCOUNTER — Encounter: Payer: Self-pay | Admitting: *Deleted

## 2014-12-30 ENCOUNTER — Ambulatory Visit (HOSPITAL_BASED_OUTPATIENT_CLINIC_OR_DEPARTMENT_OTHER): Payer: PRIVATE HEALTH INSURANCE

## 2014-12-30 VITALS — BP 159/87 | HR 86 | Temp 97.9°F | Resp 16

## 2014-12-30 DIAGNOSIS — Z5111 Encounter for antineoplastic chemotherapy: Secondary | ICD-10-CM

## 2014-12-30 DIAGNOSIS — C184 Malignant neoplasm of transverse colon: Secondary | ICD-10-CM

## 2014-12-30 MED ORDER — OXALIPLATIN CHEMO INJECTION 100 MG/20ML
130.0000 mg/m2 | Freq: Once | INTRAVENOUS | Status: AC
  Administered 2014-12-30: 275 mg via INTRAVENOUS
  Filled 2014-12-30: qty 55

## 2014-12-30 MED ORDER — DEXTROSE 5 % IV SOLN
Freq: Once | INTRAVENOUS | Status: AC
  Administered 2014-12-30: 15:00:00 via INTRAVENOUS

## 2014-12-30 MED ORDER — SODIUM CHLORIDE 0.9 % IJ SOLN
10.0000 mL | INTRAMUSCULAR | Status: DC | PRN
  Administered 2014-12-30: 10 mL
  Filled 2014-12-30: qty 10

## 2014-12-30 MED ORDER — HEPARIN SOD (PORK) LOCK FLUSH 100 UNIT/ML IV SOLN
500.0000 [IU] | Freq: Once | INTRAVENOUS | Status: AC | PRN
  Administered 2014-12-30: 500 [IU]
  Filled 2014-12-30: qty 5

## 2014-12-30 MED ORDER — DEXAMETHASONE SODIUM PHOSPHATE 100 MG/10ML IJ SOLN
Freq: Once | INTRAMUSCULAR | Status: AC
  Administered 2014-12-30: 15:00:00 via INTRAVENOUS
  Filled 2014-12-30: qty 4

## 2014-12-30 NOTE — Patient Instructions (Signed)
[facility] Discharge Instructions for Patients Receiving Chemotherapy  Today you received the following chemotherapy agents Oxaliplatin  To help prevent nausea and vomiting after your treatment, we encourage you to take your nausea medication Compazine 10 mg every 6 hours as needed.   If you develop nausea and vomiting that is not controlled by your nausea medication, call the clinic.   BELOW ARE SYMPTOMS THAT SHOULD BE REPORTED IMMEDIATELY:  *FEVER GREATER THAN 100.5 F  *CHILLS WITH OR WITHOUT FEVER  NAUSEA AND VOMITING THAT IS NOT CONTROLLED WITH YOUR NAUSEA MEDICATION  *UNUSUAL SHORTNESS OF BREATH  *UNUSUAL BRUISING OR BLEEDING  TENDERNESS IN MOUTH AND THROAT WITH OR WITHOUT PRESENCE OF ULCERS  *URINARY PROBLEMS  *BOWEL PROBLEMS  UNUSUAL RASH Items with * indicate a potential emergency and should be followed up as soon as possible.  Feel free to call the clinic you have any questions or concerns. The clinic phone number is [phone].  Please show the CHEMO ALERT CARD at check-in to the Emergency Department and triage nurse.   

## 2014-12-30 NOTE — Progress Notes (Signed)
Oncology Nurse Navigator Documentation  Oncology Nurse Navigator Flowsheets 12/30/2014  Referral date to RadOnc/MedOnc -  Navigator Encounter Type Treatment  Patient Visit Type Medonc  Treatment Phase First Chemo Tx-[id number]  Barriers/Navigation Needs Family concerns  Education Other;Concerns with Insurance Coverage  Interventions Referrals;F/U with MD regarding schedule change  Referrals Oral chemo pharmacist  Education Method Verbal  Support Groups/Services -  Time Spent with Patient 15  Marisal reports her co pay for 1 cycle of Xeloda would have been $500 if she had not met her out of pocket deductible. Unsure what will happen at next fill. Will relay this to our oral chemo pharmacist to investigate. OK from Dr. [name] to move her 1/13 chemo to 1/20. Wishes to stay on Friday to help her be able to work. POF to scheduler.

## 2014-12-31 ENCOUNTER — Other Ambulatory Visit: Payer: PRIVATE HEALTH INSURANCE

## 2014-12-31 ENCOUNTER — Encounter: Payer: Self-pay | Admitting: Genetic Counselor

## 2014-12-31 ENCOUNTER — Telehealth: Payer: Self-pay | Admitting: Oncology

## 2014-12-31 ENCOUNTER — Ambulatory Visit (HOSPITAL_BASED_OUTPATIENT_CLINIC_OR_DEPARTMENT_OTHER): Payer: PRIVATE HEALTH INSURANCE | Admitting: Genetic Counselor

## 2014-12-31 DIAGNOSIS — Z800 Family history of malignant neoplasm of digestive organs: Secondary | ICD-10-CM | POA: Diagnosis not present

## 2014-12-31 DIAGNOSIS — Z808 Family history of malignant neoplasm of other organs or systems: Secondary | ICD-10-CM

## 2014-12-31 DIAGNOSIS — C184 Malignant neoplasm of transverse colon: Secondary | ICD-10-CM

## 2014-12-31 DIAGNOSIS — Z315 Encounter for genetic counseling: Secondary | ICD-10-CM

## 2014-12-31 DIAGNOSIS — Z840 Family history of diseases of the skin and subcutaneous tissue: Secondary | ICD-10-CM

## 2014-12-31 DIAGNOSIS — Z803 Family history of malignant neoplasm of breast: Secondary | ICD-10-CM | POA: Diagnosis not present

## 2014-12-31 DIAGNOSIS — Z8049 Family history of malignant neoplasm of other genital organs: Secondary | ICD-10-CM | POA: Diagnosis not present

## 2014-12-31 DIAGNOSIS — Z8042 Family history of malignant neoplasm of prostate: Secondary | ICD-10-CM

## 2014-12-31 NOTE — Progress Notes (Signed)
REFERRING PROVIDER: Ladell Pier, MD [address] [place], [place] [address]  PRIMARY PROVIDER:  No PCP Per Patient  PRIMARY REASON FOR VISIT:  1. 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 Family history of colon cancer   3. Family history of stomach cancer   4. Family history of cancer of female genital organ   5. Family history of breast cancer in female   47. Family history of prostate cancer   7. Family history of nonmelanoma skin cancer   8. Family history of throat cancer      HISTORY OF PRESENT ILLNESS:   Ms. Bearse, a 44 y.o. female, was seen for a [place] cancer genetics consultation at the request of Dr. [name] due to a personal history of colon cancer at 74 and family history of colon, stomach, and other cancers.  Ms. Drumgoole presents to clinic today with her mother to discuss the possibility of a hereditary predisposition to cancer, genetic testing, and to further clarify her future cancer risks, as well as potential cancer risks for family members.   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 This was treated with surgical resection.  Tumor studies were performed at [facility] where the colonic tumor was reportedly MSI stable and had normal immunohistochemistry upon staining.       CANCER HISTORY:   No history exists.     HORMONAL RISK FACTORS:  Menarche was at age 29.  First live birth at age 71.  OCP use for approximately 20+ years.  Ovaries intact: yes.  Hysterectomy: no.  Menopausal status: premenopausal.  HRT use: 0 years. Colonoscopy: yes, this was her first; abnormal for colon tumor. Mammogram within the last year: yes. Number of breast biopsies: 1, but follow-up was normal. Up to date with pelvic exams:  yes. Any excessive radiation exposure in the past:  no  Past Medical History  Diagnosis Date  . Cancer ([place])     colon ca  . Complication of  anesthesia     N/V  . PONV (postoperative nausea and vomiting)     vomit with Gallbladder surgery    Past Surgical History  Procedure Laterality Date  . Tonsillectomy    . Colon surgery    . Cholecystectomy    . Knee arthroscopy Right     Social History   Social History  . Marital Status: Married    Spouse Name: N/A  . Number of Children: N/A  . Years of Education: N/A   Social History Main Topics  . Smoking status: Never Smoker   . Smokeless tobacco: Never Used  . Alcohol Use: No  . Drug Use: None  . Sexual Activity: Not Asked   Other Topics Concern  . None   Social History Narrative     FAMILY HISTORY:  We obtained a detailed, 4-generation family history.  Significant diagnoses are listed below: Family History  Problem Relation Age of Onset  . Other Father     doesn't go to doctor or get colonoscopies  . Other Brother     stomach issues; has had early colonoscopy  . Other Maternal Aunt     won't go to doctor; probably has not had colonoscopy  . Cancer Cousin     "some kind of stomach cancer" dx. early 42s  . Skin cancer Maternal Grandmother     non-melanoma; dx. late 60s  . Prostate cancer Maternal Grandfather  dx. late 59s  . Stomach cancer Other     dx. "older"  . Colon cancer Other     dx. 38s  . Cancer Other     "female cancer" dx. "later age";   Marland Kitchen Breast cancer Other     dx. 50s-60s  . Breast cancer Paternal Grandmother     dx. 56s  . Cervical cancer Paternal Aunt 36    treated at [facility]  . Throat cancer Paternal Aunt 42    smoker    Ms. Zawistowski has one daughter, Lanelle Bal, who is currently 30 years of age.  Ms. Bahe has no full siblings but has one maternal half-sister and two maternal half-brothers, all between the ages of 70 and 29.  None of her siblings have ever been diagnosed with cancer.  One of Ms. Matsuo's brothers has a history of stomach issues and so has had a colonoscopy before the age of 27.  Ms. Wolfman mother is currently  47 and has never been diagnosed with cancer and has never had any colon polyps on colonoscopy.  Ms. Balducci father is currently 61 and has never been diagnosed with cancer, but he does not go to the doctor very often, so has not had any colonoscopies.  Ms. Mascaro mother has one full sister who is currently 38.  This sister also will not go to the doctor and so has probably not had a colonoscopy.  This sister has three sons and three daughters.  One of her sons died of "some kind of stomach cancer" in his early 56s.  Ms. Noyes maternal grandmother died in her 57s; she had a history of non-melanoma skin cancer in her late 85s.  She had around seven siblings.  Of these siblings, Ms. Bronkema mother was aware that one, a brother, had a history of colon cancer in his 90s.  Another, a sister, had a history of breast cancer in her 55s-60s and a "female cancer" diagnosed at a "later age".  Ms. Sealey maternal grandfather died at 51 of a stroke; he also had a history of prostate cancer diagnosed in his 83s.  This grandfather had no full siblings, but had multiple maternal half-siblings and one brother was diagnosed with stomach cancer at an "older" age.  Ms. Guynes and her mother had no further information for other maternal great aunts/uncles or great grandparents.  They are unaware of any previous genetic testing for family members.    Ms. Beyersdorf father has one full brother and three full sisters.  One of his sisters has passed away; she was a smoker and was diagnosed with throat cancer at the age of 36.  She had two daughters, neither of whom have had cancer.  This sister had an identical twin sister who has a history of cervical cancer diagnosed at the age of 30; she was treated at [facility] for this cancer.  She had two sons who have not had cancer.  The third paternal aunt is currently 80-60 and has never had cancer; the paternal uncle is currently 44 and has not had cancer.  Ms. Venard paternal  grandparents both passed away in their 38s.  Her paternal grandmother died of breast cancer.  She has no further information for any paternal great aunts/uncles or great grandparents.    Patient's maternal ancestors are of [place] and Native American descent, and paternal ancestors are of Caucasian descent. There is no reported Ashkenazi Jewish ancestry. There is no known consanguinity.  GENETIC COUNSELING ASSESSMENT: Jenniferann L  Cino is a 44 y.o. female with a personal and family history of cancer which is somewhat suggestive of a hereditary colon cancer syndrome and predisposition to cancer. We, therefore, discussed and recommended the following at today's visit.   DISCUSSION: We reviewed the characteristics, features and inheritance patterns of hereditary cancer syndromes, particularly those caused by mutations within the Lynch syndrome, APC, and MUTYH genes. We also discussed genetic testing, including the appropriate family members to test, the process of testing, insurance coverage and turn-around-time for results. We discussed the implications of a negative, positive and/or variant of uncertain significant result. We recommended Ms. Fouch pursue genetic testing for the 19-gene Colorectal Cancer Panel with MSH2 Exons 1-7 Inversion Analysis through [facility].  The Colorectal Cancer Panel offered by [facility] includes sequencing and/or duplication/deletion testing of the following 19 genes: APC, ATM, AXIN2, BMPR1A, CDH1, CHEK2, EPCAM, MLH1, MSH2, MSH6, MUTYH, PMS2, POLD1, POLE, PTEN, SCG5/GREM1, SMAD4, STK11, and TP53.     Based on Ms. Edelstein's personal and family history of cancer, she meets medical criteria for genetic testing. Despite that she meets criteria, she may still have an out of pocket cost. We discussed that if her out of pocket cost for testing is over $100, the laboratory will call and confirm whether she wants to proceed with testing.  If the out of pocket cost of testing is less  than $100 she will be billed by the genetic testing laboratory.   PLAN: After considering the risks, benefits, and limitations, Ms. Lashway  provided informed consent to pursue genetic testing and the blood sample was sent to [facility] for analysis of the 19-gene Colorectal Cancer Panel with MSH2 Exons 1-7 Inversion Analysis. Results should be available within approximately 3 weeks' time, at which point they will be disclosed by telephone to Ms. Argote, as will any additional recommendations warranted by these results. Ms. Huckeba will receive a summary of her genetic counseling visit and a copy of her results once available. This information will also be available in Epic. We encouraged Ms. Ziehm to remain in contact with cancer genetics annually so that we can continuously update the family history and inform her of any changes in cancer genetics and testing that may be of benefit for her family. Ms. Sprenkle questions were answered to her satisfaction today. Our contact information was provided should additional questions or concerns arise.  Thank you for the referral and allowing [place] to share in the care of your patient.   Jeanine Luz, MS Genetic Counselor [email or web address] Phone: [phone]  The patient was seen for a total of 60 minutes in face-to-face genetic counseling.  This patient was discussed with Drs. Magrinat, Lindi Adie and/or Burr Medico who agrees with the above.    _______________________________________________________________________ For Office Staff:  Number of people involved in session: 2 Was an Intern/ student involved with case: no

## 2014-12-31 NOTE — Telephone Encounter (Signed)
Pt came in says her chemo appt needs to be fixed. Says she was told that she would have 1 week off between the chemo and oral drugs so 1/13 appt should be 1/20. Searched for order and found order from RN requesting 1/13 be moved to 1/20. Moved appt and calendar given to pt.

## 2015-01-06 ENCOUNTER — Other Ambulatory Visit: Payer: Self-pay | Admitting: Oncology

## 2015-01-06 DIAGNOSIS — C184 Malignant neoplasm of transverse colon: Secondary | ICD-10-CM

## 2015-01-08 ENCOUNTER — Other Ambulatory Visit: Payer: PRIVATE HEALTH INSURANCE

## 2015-01-08 ENCOUNTER — Ambulatory Visit: Payer: PRIVATE HEALTH INSURANCE | Admitting: Nurse Practitioner

## 2015-01-08 ENCOUNTER — Ambulatory Visit (HOSPITAL_BASED_OUTPATIENT_CLINIC_OR_DEPARTMENT_OTHER): Payer: PRIVATE HEALTH INSURANCE | Admitting: Nurse Practitioner

## 2015-01-08 ENCOUNTER — Telehealth: Payer: Self-pay | Admitting: Oncology

## 2015-01-08 ENCOUNTER — Other Ambulatory Visit (HOSPITAL_BASED_OUTPATIENT_CLINIC_OR_DEPARTMENT_OTHER): Payer: PRIVATE HEALTH INSURANCE

## 2015-01-08 VITALS — BP 146/83 | HR 89 | Temp 99.0°F | Resp 18 | Ht 65.0 in | Wt 216.1 lb

## 2015-01-08 DIAGNOSIS — C779 Secondary and unspecified malignant neoplasm of lymph node, unspecified: Secondary | ICD-10-CM

## 2015-01-08 DIAGNOSIS — D509 Iron deficiency anemia, unspecified: Secondary | ICD-10-CM

## 2015-01-08 DIAGNOSIS — Z803 Family history of malignant neoplasm of breast: Secondary | ICD-10-CM | POA: Diagnosis not present

## 2015-01-08 DIAGNOSIS — C184 Malignant neoplasm of transverse colon: Secondary | ICD-10-CM

## 2015-01-08 DIAGNOSIS — Z8041 Family history of malignant neoplasm of ovary: Secondary | ICD-10-CM

## 2015-01-08 LAB — CBC WITH DIFFERENTIAL/PLATELET
BASO%: 0.8 % (ref 0.0–2.0)
Basophils Absolute: 0.1 10*3/uL (ref 0.0–0.1)
EOS%: 2 % (ref 0.0–7.0)
Eosinophils Absolute: 0.1 10*3/uL (ref 0.0–0.5)
HCT: 41.9 % (ref 34.8–46.6)
HGB: 13.8 g/dL (ref 11.6–15.9)
LYMPH%: 34.1 % (ref 14.0–49.7)
MCH: 25.7 pg (ref 25.1–34.0)
MCHC: 32.9 g/dL (ref 31.5–36.0)
MCV: 77.9 fL — ABNORMAL LOW (ref 79.5–101.0)
MONO#: 0.8 10*3/uL (ref 0.1–0.9)
MONO%: 12.2 % (ref 0.0–14.0)
NEUT#: 3.3 10*3/uL (ref 1.5–6.5)
NEUT%: 50.9 % (ref 38.4–76.8)
NRBC: 0 % (ref 0–0)
PLATELETS: 216 10*3/uL (ref 145–400)
RBC: 5.38 10*6/uL (ref 3.70–5.45)
RDW: 14 % (ref 11.2–14.5)
WBC: 6.5 10*3/uL (ref 3.9–10.3)
lymph#: 2.2 10*3/uL (ref 0.9–3.3)

## 2015-01-08 NOTE — Progress Notes (Signed)
  [facility] OFFICE PROGRESS NOTE   Diagnosis:   Colon cancer  INTERVAL HISTORY:   Brenda Becker returns as scheduled. She began cycle 1 CAPOX 12/30/2014. She had nausea/vomiting beginning day 3. The nausea lasted about 3 days. No mouth sores. No diarrhea. Cold sensitivity is improving. No persistent neuropathy symptoms. No hand or foot pain or redness.  Objective:  Vital signs in last 24 hours:  Blood pressure 146/83, pulse 89, temperature 99 F (37.2 C), temperature source Oral, resp. rate 18, height 5\' 5"  (1.651 m), weight 216 lb 1.6 oz (98.022 kg), last menstrual period 11/26/2014, SpO2 100 %.    HEENT: No thrush or ulcers. Resp: Lungs clear bilaterally. Cardio: Regular rate and rhythm. GI: Abdomen soft and nontender. No hepatomegaly. Vascular: No leg edema. Calves soft and nontender. Neuro: Vibratory sense intact over the fingertips per tuning fork exam.  Skin: Palms with mild erythema. No skin breakdown. Port-A-Cath without erythema.    Lab Results:  Lab Results  Component Value Date   WBC 6.5 01/08/2015   HGB 13.8 01/08/2015   HCT 41.9 01/08/2015   MCV 77.9* 01/08/2015   PLT 216 01/08/2015   NEUTROABS 3.3 01/08/2015    Imaging:  No results found.  Medications: I have reviewed the patient's current medications.  Assessment/Plan: 1. 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 2. Microcytic anemia-likely iron deficiency anemia secondary to #1 and ongoing menses 3. Status post cholecystectomy 4. Family history of breast and ovarian cancer. She has seen the genetics counselor. 5. Delayed nausea following cycle 1 CAPOX. Aloxi and Emend added beginning with cycle 2.    Disposition: Brenda Becker  appears stable. She is completing cycle 1 CAPOX. She had delayed nausea following the oxaliplatin. We will adjust the premedications to include Aloxi and Emend beginning with cycle 2.  She will return for follow-up visit and cycle 2 CAPOX on 01/22/2015. She will contact the office in the interim with any problems.  Plan reviewed with Dr. [name].    [name]   01/08/2015  11:39 AM

## 2015-01-08 NOTE — Telephone Encounter (Signed)
Gave patient avs report and appointments for January and February  °

## 2015-01-15 ENCOUNTER — Ambulatory Visit: Payer: PRIVATE HEALTH INSURANCE | Admitting: Oncology

## 2015-01-15 ENCOUNTER — Ambulatory Visit: Payer: PRIVATE HEALTH INSURANCE

## 2015-01-22 ENCOUNTER — Ambulatory Visit (HOSPITAL_BASED_OUTPATIENT_CLINIC_OR_DEPARTMENT_OTHER): Payer: PRIVATE HEALTH INSURANCE

## 2015-01-22 ENCOUNTER — Other Ambulatory Visit: Payer: PRIVATE HEALTH INSURANCE

## 2015-01-22 ENCOUNTER — Ambulatory Visit (HOSPITAL_BASED_OUTPATIENT_CLINIC_OR_DEPARTMENT_OTHER): Payer: PRIVATE HEALTH INSURANCE | Admitting: Nurse Practitioner

## 2015-01-22 ENCOUNTER — Ambulatory Visit: Payer: PRIVATE HEALTH INSURANCE

## 2015-01-22 ENCOUNTER — Other Ambulatory Visit (HOSPITAL_BASED_OUTPATIENT_CLINIC_OR_DEPARTMENT_OTHER): Payer: PRIVATE HEALTH INSURANCE

## 2015-01-22 ENCOUNTER — Telehealth: Payer: Self-pay | Admitting: Nurse Practitioner

## 2015-01-22 ENCOUNTER — Telehealth: Payer: Self-pay | Admitting: Genetic Counselor

## 2015-01-22 VITALS — BP 127/80 | HR 92 | Temp 98.6°F | Resp 18 | Ht 65.0 in | Wt 220.3 lb

## 2015-01-22 DIAGNOSIS — C779 Secondary and unspecified malignant neoplasm of lymph node, unspecified: Secondary | ICD-10-CM

## 2015-01-22 DIAGNOSIS — C184 Malignant neoplasm of transverse colon: Secondary | ICD-10-CM

## 2015-01-22 DIAGNOSIS — R110 Nausea: Secondary | ICD-10-CM

## 2015-01-22 DIAGNOSIS — D509 Iron deficiency anemia, unspecified: Secondary | ICD-10-CM | POA: Diagnosis not present

## 2015-01-22 DIAGNOSIS — Z5111 Encounter for antineoplastic chemotherapy: Secondary | ICD-10-CM | POA: Diagnosis not present

## 2015-01-22 LAB — COMPREHENSIVE METABOLIC PANEL
ALBUMIN: 3.9 g/dL (ref 3.5–5.0)
ALK PHOS: 94 U/L (ref 40–150)
ALT: 27 U/L (ref 0–55)
AST: 22 U/L (ref 5–34)
Anion Gap: 9 mEq/L (ref 3–11)
BUN: 7.5 mg/dL (ref 7.0–26.0)
CO2: 26 mEq/L (ref 22–29)
Calcium: 9.6 mg/dL (ref 8.4–10.4)
Chloride: 107 mEq/L (ref 98–109)
Creatinine: 0.9 mg/dL (ref 0.6–1.1)
EGFR: 81 mL/min/{1.73_m2} — AB (ref >90)
GLUCOSE: 106 mg/dL (ref 70–140)
POTASSIUM: 4.4 meq/L (ref 3.5–5.1)
SODIUM: 141 meq/L (ref 136–145)
Total Bilirubin: 0.53 mg/dL (ref 0.20–1.20)
Total Protein: 8.1 g/dL (ref 6.4–8.3)

## 2015-01-22 LAB — CBC WITH DIFFERENTIAL/PLATELET
BASO%: 0.8 % (ref 0.0–2.0)
BASOS ABS: 0.1 10*3/uL (ref 0.0–0.1)
EOS ABS: 0.2 10*3/uL (ref 0.0–0.5)
EOS%: 3.4 % (ref 0.0–7.0)
HCT: 43.2 % (ref 34.8–46.6)
HEMOGLOBIN: 13.8 g/dL (ref 11.6–15.9)
LYMPH%: 38.4 % (ref 14.0–49.7)
MCH: 25.4 pg (ref 25.1–34.0)
MCHC: 32 g/dL (ref 31.5–36.0)
MCV: 79.4 fL — AB (ref 79.5–101.0)
MONO#: 0.6 10*3/uL (ref 0.1–0.9)
MONO%: 9.4 % (ref 0.0–14.0)
NEUT#: 3 10*3/uL (ref 1.5–6.5)
NEUT%: 48 % (ref 38.4–76.8)
Platelets: 221 10*3/uL (ref 145–400)
RBC: 5.45 10*6/uL (ref 3.70–5.45)
RDW: 16 % — ABNORMAL HIGH (ref 11.2–14.5)
WBC: 6.3 10*3/uL (ref 3.9–10.3)
lymph#: 2.4 10*3/uL (ref 0.9–3.3)

## 2015-01-22 MED ORDER — SODIUM CHLORIDE 0.9 % IV SOLN
Freq: Once | INTRAVENOUS | Status: AC
  Administered 2015-01-22: 12:00:00 via INTRAVENOUS
  Filled 2015-01-22: qty 5

## 2015-01-22 MED ORDER — DEXTROSE 5 % IV SOLN
130.0000 mg/m2 | Freq: Once | INTRAVENOUS | Status: AC
  Administered 2015-01-22: 275 mg via INTRAVENOUS
  Filled 2015-01-22: qty 55

## 2015-01-22 MED ORDER — PALONOSETRON HCL INJECTION 0.25 MG/5ML
INTRAVENOUS | Status: AC
  Filled 2015-01-22: qty 5

## 2015-01-22 MED ORDER — HEPARIN SOD (PORK) LOCK FLUSH 100 UNIT/ML IV SOLN
500.0000 [IU] | Freq: Once | INTRAVENOUS | Status: AC | PRN
  Administered 2015-01-22: 500 [IU]
  Filled 2015-01-22: qty 5

## 2015-01-22 MED ORDER — DEXTROSE 5 % IV SOLN
Freq: Once | INTRAVENOUS | Status: AC
  Administered 2015-01-22: 12:00:00 via INTRAVENOUS

## 2015-01-22 MED ORDER — SODIUM CHLORIDE 0.9 % IJ SOLN
10.0000 mL | INTRAMUSCULAR | Status: DC | PRN
  Administered 2015-01-22: 10 mL
  Filled 2015-01-22: qty 10

## 2015-01-22 MED ORDER — PALONOSETRON HCL INJECTION 0.25 MG/5ML
0.2500 mg | Freq: Once | INTRAVENOUS | Status: AC
  Administered 2015-01-22: 0.25 mg via INTRAVENOUS

## 2015-01-22 NOTE — Progress Notes (Signed)
  [facility] OFFICE PROGRESS NOTE   Diagnosis:  Colon cancer  INTERVAL HISTORY:   Brenda Becker returns as scheduled. She completed cycle 1 CAPOX beginning 12/30/2014. She had delayed nausea following cycle #1. No nausea or vomiting at present. No mouth sores. No diarrhea. No hand or foot pain or redness. She denies cold sensitivity. No numbness in the hands or feet. She has a good appetite.  Objective:  Vital signs in last 24 hours:  Blood pressure 127/80, pulse 92, temperature 98.6 F (37 C), temperature source Oral, resp. rate 18, height 5\' 5"  (1.651 m), weight 220 lb 4.8 oz (99.927 kg), SpO2 100 %.    HEENT: No thrush or ulcers. Resp: Lungs clear bilaterally. Cardio: Regular rate and rhythm. GI: Abdomen soft and nontender. No hepatomegaly. Vascular: No leg edema. Calves soft and nontender. Neuro: Vibratory sense intact over the fingertips per tuning fork exam.  Port-A-Cath without erythema.    Lab Results:  Lab Results  Component Value Date   WBC 6.3 01/22/2015   HGB 13.8 01/22/2015   HCT 43.2 01/22/2015   MCV 79.4* 01/22/2015   PLT 221 01/22/2015   NEUTROABS 3.0 01/22/2015    Imaging:  No results found.  Medications: I have reviewed the patient's current medications.  Assessment/Plan: 1. 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  Cycle 2 CAPOX 01/22/2015 2. Microcytic anemia-likely iron deficiency anemia secondary to #1 and ongoing menses 3. Status post cholecystectomy 4. Family history of breast and ovarian cancer. She has seen the genetics counselor. 5. Delayed nausea following cycle 1 CAPOX. Aloxi and Emend added beginning with cycle 2.   Disposition: Brenda Becker appears stable. She  has completed 1 cycle of CAPOX. Plan to proceed with cycle 2 today as scheduled. She will receive Aloxi and Emend with treatment today due to delayed nausea following cycle 1. She has Compazine for as needed use at home. She understands to contact the office if she has poorly controlled nausea/vomiting.  She will return for follow-up visit and cycle 3 CAPOX in 3 weeks.    [name]   01/22/2015  11:28 AM

## 2015-01-22 NOTE — Telephone Encounter (Signed)
Discussed with [name] that her genetic test results were negative for pathogenic mutations within any of 19 genes that would cause her to be at an increased risk for colorectal polyps, colorectal cancers, or other related cancers.  Additionally, no variants of uncertain significance (VUSes) were found.  Discussed that this is most likely a reassuring result, since neither of [name]'s parents have had cancer and they are both in their late 31s.  Additionally, many of the other cancers in the family were diagnosed at later ages.  Encouraged [name] to keep in touch with [place], to update the family history if anyone else is diagnosed with cancer in the future and we can re-evaluate her risks and potential additional testing options at that point in time.  Discussed that her siblings and parents should have colonoscopy screening.  Her daughter can begin colonoscopy screening at the age of 33 based on [name]'s age of diagnosis.  Encouraged [name] to call or email me with any questions that she may have.

## 2015-01-22 NOTE — Patient Instructions (Signed)
[facility] Discharge Instructions for Patients Receiving Chemotherapy  Today you received the following chemotherapy agents Oxaliplatin  To help prevent nausea and vomiting after your treatment, we encourage you to take your nausea medication Compazine 10 mg every 6 hours as needed.   If you develop nausea and vomiting that is not controlled by your nausea medication, call the clinic.   BELOW ARE SYMPTOMS THAT SHOULD BE REPORTED IMMEDIATELY:  *FEVER GREATER THAN 100.5 F  *CHILLS WITH OR WITHOUT FEVER  NAUSEA AND VOMITING THAT IS NOT CONTROLLED WITH YOUR NAUSEA MEDICATION  *UNUSUAL SHORTNESS OF BREATH  *UNUSUAL BRUISING OR BLEEDING  TENDERNESS IN MOUTH AND THROAT WITH OR WITHOUT PRESENCE OF ULCERS  *URINARY PROBLEMS  *BOWEL PROBLEMS  UNUSUAL RASH Items with * indicate a potential emergency and should be followed up as soon as possible.  Feel free to call the clinic you have any questions or concerns. The clinic phone number is [phone].  Please show the CHEMO ALERT CARD at check-in to the Emergency Department and triage nurse.   

## 2015-01-22 NOTE — Telephone Encounter (Signed)
Appointments added per pof and patient will get a new avs in chemo °

## 2015-01-25 ENCOUNTER — Ambulatory Visit: Payer: Self-pay | Admitting: Genetic Counselor

## 2015-01-25 DIAGNOSIS — Z809 Family history of malignant neoplasm, unspecified: Secondary | ICD-10-CM

## 2015-01-25 DIAGNOSIS — C184 Malignant neoplasm of transverse colon: Secondary | ICD-10-CM

## 2015-01-25 DIAGNOSIS — Z1379 Encounter for other screening for genetic and chromosomal anomalies: Secondary | ICD-10-CM | POA: Insufficient documentation

## 2015-01-25 HISTORY — DX: Family history of malignant neoplasm, unspecified: Z80.9

## 2015-01-25 NOTE — Progress Notes (Signed)
GENETIC TEST RESULT  HPI: Brenda Becker was previously seen in the [facility] clinic due to a personal history of colon cancer at 59, family history of colon, stomach, and other cancers, and concerns regarding a hereditary predisposition to cancer. Please refer to our prior cancer genetics clinic note from December 31, 2014 for more information regarding Brenda Becker's medical, social and family histories, and our assessment and recommendations, at the time. Brenda Becker recent genetic test results were disclosed to her, as were recommendations warranted by these results. These results and recommendations are discussed in more detail below.  GENETIC TEST RESULTS: At the time of Brenda Becker's visit on 12/31/14, we recommended she pursue genetic testing of the 19-gene Colorectal Cancer Panel through [facility] Brenda Pigeon, MD).  The Colorectal Cancer Panel offered by [facility] includes sequencing and/or duplication/deletion testing of the following 19 genes: APC, ATM, AXIN2, BMPR1A, CDH1, CHEK2, EPCAM, MLH1, MSH2, MSH6, MUTYH, PMS2, POLD1, POLE, PTEN, SCG5/GREM1, SMAD4, STK11, and TP53.  Those results are now back, the report date for which is January 19, 2015.  MSH2 Exons 1-7 Inversion Analysis was also performed.  Date of report is January 21, 2015. Genetic testing was normal, and did not reveal a deleterious mutation in these genes.  Additionally, no variants of uncertain significance (VUSes) were found.  The test report will be scanned into EPIC and will be located under the Results Review tab in the Pathology>Molecular Pathology section.   We discussed with Brenda Becker that since the current genetic testing is not perfect, it is possible there may be a gene mutation in one of these genes that current testing cannot detect, but that chance is small. We also discussed, that it is possible that another gene that has not yet been discovered, or that we have not yet tested, is  responsible for the cancer diagnoses in the family, and it is, therefore, important to remain in touch with cancer genetics in the future so that we can continue to offer Brenda Becker the most up to date genetic testing.   CANCER SCREENING RECOMMENDATIONS: While we still do not have an explanation for the personal and family history of cancer, this result is reassuring and indicates that Brenda Becker likely does not have an increased risk for a future cancer due to a mutation in one of these genes. This normal test also suggests that Brenda Becker's cancer was most likely not due to an inherited predisposition associated with one of these genes.  Additionally, many of Brenda Becker's relatives who have been diagnosed with cancer are her more distant relatives and her parents are currently in their late 67s and have never had cancer.  Most cancers happen by chance and this negative test suggests that her cancer falls into this category.  We, therefore, recommended she continue to follow the cancer management and screening guidelines provided by her oncology and primary healthcare providers.   RECOMMENDATIONS FOR FAMILY MEMBERS: Women in this family might be at some increased risk of developing cancer, over the general population risk, simply due to the family history of cancer. We recommended women in this family have a yearly mammogram beginning at age 18, or 5 years younger than the earliest onset of cancer, an an annual clinical breast exam, and perform monthly breast self-exams. Women in this family should also have a gynecological exam as recommended by their primary provider. All family members should have a colonoscopy by age 27, except for Brenda Becker's first degree relatives (parents,  siblings, children) who can begin colonoscopy screening at an earlier age based on her young age of diagnosis.  Many of these relatives are may already be having colonoscopy screening and they should do so every 5 years or  more often with positive findings.  Brenda Becker daughter, Brenda Becker, who is currently 45 years old, can begin colonoscopy screening at the age of 28 based on current guidelines and her mother's age of diagnosis.  She will need to make her future primary doctor aware of this family history, so she can continue to receive the most appropriate cancer screening even as guidelines evolve in the future.      FOLLOW-UP: Lastly, we discussed with Brenda Becker that cancer genetics is a rapidly advancing field and it is possible that new genetic tests will be appropriate for her and/or her family members in the future. We encouraged her to remain in contact with cancer genetics on an annual basis so we can update her personal and family histories and let her know of advances in cancer genetics that may benefit this family.   Our contact number was provided. Brenda Becker questions were answered to her satisfaction, and she knows she is welcome to call us at anytime with additional questions or concerns.   Brenda Luz, MS Genetic Counselor [email or web address] Phone: [phone]

## 2015-01-27 ENCOUNTER — Other Ambulatory Visit: Payer: Self-pay | Admitting: Oncology

## 2015-01-27 DIAGNOSIS — C184 Malignant neoplasm of transverse colon: Secondary | ICD-10-CM

## 2015-02-07 ENCOUNTER — Other Ambulatory Visit: Payer: Self-pay | Admitting: Oncology

## 2015-02-12 ENCOUNTER — Other Ambulatory Visit (HOSPITAL_BASED_OUTPATIENT_CLINIC_OR_DEPARTMENT_OTHER): Payer: PRIVATE HEALTH INSURANCE

## 2015-02-12 ENCOUNTER — Ambulatory Visit (HOSPITAL_BASED_OUTPATIENT_CLINIC_OR_DEPARTMENT_OTHER): Payer: PRIVATE HEALTH INSURANCE

## 2015-02-12 ENCOUNTER — Ambulatory Visit (HOSPITAL_BASED_OUTPATIENT_CLINIC_OR_DEPARTMENT_OTHER): Payer: PRIVATE HEALTH INSURANCE | Admitting: Oncology

## 2015-02-12 ENCOUNTER — Telehealth: Payer: Self-pay | Admitting: Oncology

## 2015-02-12 VITALS — BP 141/78 | HR 87 | Temp 98.6°F | Resp 17 | Ht 65.0 in | Wt 224.5 lb

## 2015-02-12 DIAGNOSIS — R110 Nausea: Secondary | ICD-10-CM | POA: Diagnosis not present

## 2015-02-12 DIAGNOSIS — C779 Secondary and unspecified malignant neoplasm of lymph node, unspecified: Secondary | ICD-10-CM

## 2015-02-12 DIAGNOSIS — Z5111 Encounter for antineoplastic chemotherapy: Secondary | ICD-10-CM

## 2015-02-12 DIAGNOSIS — C184 Malignant neoplasm of transverse colon: Secondary | ICD-10-CM

## 2015-02-12 DIAGNOSIS — D509 Iron deficiency anemia, unspecified: Secondary | ICD-10-CM

## 2015-02-12 LAB — COMPREHENSIVE METABOLIC PANEL
ALT: 20 U/L (ref 0–55)
AST: 20 U/L (ref 5–34)
Albumin: 3.6 g/dL (ref 3.5–5.0)
Alkaline Phosphatase: 84 U/L (ref 40–150)
Anion Gap: 13 mEq/L — ABNORMAL HIGH (ref 3–11)
BUN: 9 mg/dL (ref 7.0–26.0)
CHLORIDE: 107 meq/L (ref 98–109)
CO2: 21 mEq/L — ABNORMAL LOW (ref 22–29)
Calcium: 9.4 mg/dL (ref 8.4–10.4)
Creatinine: 0.9 mg/dL (ref 0.6–1.1)
EGFR: 83 mL/min/{1.73_m2} — AB (ref >90)
GLUCOSE: 144 mg/dL — AB (ref 70–140)
POTASSIUM: 3.9 meq/L (ref 3.5–5.1)
SODIUM: 141 meq/L (ref 136–145)
Total Bilirubin: 0.49 mg/dL (ref 0.20–1.20)
Total Protein: 7.6 g/dL (ref 6.4–8.3)

## 2015-02-12 LAB — CBC WITH DIFFERENTIAL/PLATELET
BASO%: 0.8 % (ref 0.0–2.0)
BASOS ABS: 0 10*3/uL (ref 0.0–0.1)
EOS ABS: 0.1 10*3/uL (ref 0.0–0.5)
EOS%: 2.6 % (ref 0.0–7.0)
HEMATOCRIT: 42.1 % (ref 34.8–46.6)
HEMOGLOBIN: 13.5 g/dL (ref 11.6–15.9)
LYMPH%: 36.5 % (ref 14.0–49.7)
MCH: 26.1 pg (ref 25.1–34.0)
MCHC: 31.9 g/dL (ref 31.5–36.0)
MCV: 81.8 fL (ref 79.5–101.0)
MONO#: 0.4 10*3/uL (ref 0.1–0.9)
MONO%: 8.3 % (ref 0.0–14.0)
NEUT#: 2.5 10*3/uL (ref 1.5–6.5)
NEUT%: 51.8 % (ref 38.4–76.8)
Platelets: 215 10*3/uL (ref 145–400)
RBC: 5.15 10*6/uL (ref 3.70–5.45)
RDW: 19.4 % — AB (ref 11.2–14.5)
WBC: 4.8 10*3/uL (ref 3.9–10.3)
lymph#: 1.8 10*3/uL (ref 0.9–3.3)

## 2015-02-12 MED ORDER — PALONOSETRON HCL INJECTION 0.25 MG/5ML
INTRAVENOUS | Status: AC
  Filled 2015-02-12: qty 5

## 2015-02-12 MED ORDER — OXALIPLATIN CHEMO INJECTION 100 MG/20ML
130.0000 mg/m2 | Freq: Once | INTRAVENOUS | Status: AC
  Administered 2015-02-12: 275 mg via INTRAVENOUS
  Filled 2015-02-12: qty 40

## 2015-02-12 MED ORDER — DEXTROSE 5 % IV SOLN
Freq: Once | INTRAVENOUS | Status: AC
  Administered 2015-02-12: 10:00:00 via INTRAVENOUS

## 2015-02-12 MED ORDER — SODIUM CHLORIDE 0.9 % IJ SOLN
10.0000 mL | INTRAMUSCULAR | Status: DC | PRN
  Administered 2015-02-12: 10 mL
  Filled 2015-02-12: qty 10

## 2015-02-12 MED ORDER — HEPARIN SOD (PORK) LOCK FLUSH 100 UNIT/ML IV SOLN
500.0000 [IU] | Freq: Once | INTRAVENOUS | Status: AC | PRN
  Administered 2015-02-12: 500 [IU]
  Filled 2015-02-12: qty 5

## 2015-02-12 MED ORDER — SODIUM CHLORIDE 0.9 % IV SOLN
Freq: Once | INTRAVENOUS | Status: AC
  Administered 2015-02-12: 11:00:00 via INTRAVENOUS
  Filled 2015-02-12: qty 5

## 2015-02-12 MED ORDER — PALONOSETRON HCL INJECTION 0.25 MG/5ML
0.2500 mg | Freq: Once | INTRAVENOUS | Status: AC
  Administered 2015-02-12: 0.25 mg via INTRAVENOUS

## 2015-02-12 NOTE — Patient Instructions (Signed)
Cancer Center Discharge Instructions for Patients Receiving Chemotherapy  Today you received the following chemotherapy agents Oxaliplatin  To help prevent nausea and vomiting after your treatment, we encourage you to take your nausea medication Compazine 10 mg every 6 hours as needed.   If you develop nausea and vomiting that is not controlled by your nausea medication, call the clinic.   BELOW ARE SYMPTOMS THAT SHOULD BE REPORTED IMMEDIATELY:  *FEVER GREATER THAN 100.5 F  *CHILLS WITH OR WITHOUT FEVER  NAUSEA AND VOMITING THAT IS NOT CONTROLLED WITH YOUR NAUSEA MEDICATION  *UNUSUAL SHORTNESS OF BREATH  *UNUSUAL BRUISING OR BLEEDING  TENDERNESS IN MOUTH AND THROAT WITH OR WITHOUT PRESENCE OF ULCERS  *URINARY PROBLEMS  *BOWEL PROBLEMS  UNUSUAL RASH Items with * indicate a potential emergency and should be followed up as soon as possible.  Feel free to call the clinic you have any questions or concerns. The clinic phone number is [phone].  Please show the CHEMO ALERT CARD at check-in to the Emergency Department and triage nurse.   

## 2015-02-12 NOTE — Progress Notes (Signed)
  [facility] OFFICE PROGRESS NOTE   Diagnosis: Colon cancer  INTERVAL HISTORY:   Brenda Becker returns as scheduled. She completed another cycle of CAPOX beginning 01/22/2015. She reports tolerating the chemotherapy well. Nausea was improved following this cycle. No mouth sores, diarrhea, or hand/foot pain. Hold sensitivity lasted 7-8 days following chemotherapy. No other neuropathy symptoms.  Objective:  Vital signs in last 24 hours:  Blood pressure 141/78, pulse 87, temperature 98.6 F (37 C), temperature source Oral, resp. rate 17, height 5\' 5"  (1.651 m), weight 224 lb 8 oz (101.833 kg), SpO2 100 %.    HEENT: No thrush or ulcers Resp: Lungs clear bilaterally Cardio: Regular rate and rhythm GI: No hepatosplenomegaly, nontender Vascular: No leg edema Neuro: The vibratory sense is intact at the fingertips bilaterally  Skin: Palms and soles without erythema or skin breakdown   Portacath/PICC-without erythema  Lab Results:  Lab Results  Component Value Date   WBC 4.8 02/12/2015   HGB 13.5 02/12/2015   HCT 42.1 02/12/2015   MCV 81.8 02/12/2015   PLT 215 02/12/2015   NEUTROABS 2.5 02/12/2015     Medications: I have reviewed the patient's current medications.  Assessment/Plan: 1. 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  Cycle 2 CAPOX 01/22/2015  Cycle 3 CAPOX 02/12/2015 2. Microcytic anemia-likely iron deficiency anemia secondary to #1 and ongoing menses 3. Status post cholecystectomy 4. Family history of breast and ovarian cancer. She has seen the genetics counselor. 5. Delayed nausea following cycle 1 CAPOX. Aloxi and Emend added beginning with cycle 2.   Disposition:  Brenda Becker has  completed 2 cycles of CAPOX. She has tolerated chemotherapy well. The plan is to proceed with cycle 3 today. She will return for an office visit and chemotherapy in 3 weeks.  Brenda Coder, MD  02/12/2015  10:23 AM

## 2015-02-12 NOTE — Telephone Encounter (Signed)
Gave patient avs report and appointments for March  °

## 2015-02-17 ENCOUNTER — Other Ambulatory Visit: Payer: Self-pay | Admitting: Oncology

## 2015-02-18 ENCOUNTER — Encounter: Payer: Self-pay | Admitting: Oncology

## 2015-02-18 NOTE — Progress Notes (Signed)
recd via fax.

## 2015-02-19 ENCOUNTER — Encounter: Payer: Self-pay | Admitting: Oncology

## 2015-02-19 NOTE — Progress Notes (Signed)
.  placed for dr [name] to sign

## 2015-02-22 ENCOUNTER — Encounter: Payer: Self-pay | Admitting: Oncology

## 2015-02-22 NOTE — Progress Notes (Signed)
Faxed [facility] [phone] and let patient know copy for her. Sent to medical records and noted sharepoint

## 2015-02-23 ENCOUNTER — Telehealth: Payer: Self-pay | Admitting: *Deleted

## 2015-02-23 NOTE — Telephone Encounter (Signed)
Spoke with patient. Per Dr. [name]- instructed her to skip pills that she missed and continue on as normal. Do not add on extra days at the end. Rest seven days as usual. Pt repeated instructions and verbalized understanding.

## 2015-02-23 NOTE — Telephone Encounter (Signed)
"  I missed day eight of my Xeloda.  Called the pharmacy but was instructed to call Dr. [name].  Today is day twelve of this cycle.  What do I need to do with the four pills I missed?  Do I skip, take at end, rest six days or rest seven days.  Return number [phone].

## 2015-02-25 ENCOUNTER — Other Ambulatory Visit: Payer: Self-pay | Admitting: *Deleted

## 2015-02-25 DIAGNOSIS — C184 Malignant neoplasm of transverse colon: Secondary | ICD-10-CM

## 2015-02-25 MED ORDER — CAPECITABINE 500 MG PO TABS: ORAL_TABLET | ORAL | Status: DC

## 2015-02-28 ENCOUNTER — Other Ambulatory Visit: Payer: Self-pay | Admitting: Oncology

## 2015-03-05 ENCOUNTER — Telehealth: Payer: Self-pay | Admitting: *Deleted

## 2015-03-05 ENCOUNTER — Ambulatory Visit (HOSPITAL_BASED_OUTPATIENT_CLINIC_OR_DEPARTMENT_OTHER): Payer: PRIVATE HEALTH INSURANCE

## 2015-03-05 ENCOUNTER — Ambulatory Visit (HOSPITAL_BASED_OUTPATIENT_CLINIC_OR_DEPARTMENT_OTHER): Payer: PRIVATE HEALTH INSURANCE | Admitting: Nurse Practitioner

## 2015-03-05 ENCOUNTER — Other Ambulatory Visit (HOSPITAL_BASED_OUTPATIENT_CLINIC_OR_DEPARTMENT_OTHER): Payer: PRIVATE HEALTH INSURANCE

## 2015-03-05 VITALS — BP 146/81 | HR 85 | Temp 98.5°F | Resp 18 | Ht 65.0 in | Wt 229.6 lb

## 2015-03-05 DIAGNOSIS — C779 Secondary and unspecified malignant neoplasm of lymph node, unspecified: Secondary | ICD-10-CM

## 2015-03-05 DIAGNOSIS — C184 Malignant neoplasm of transverse colon: Secondary | ICD-10-CM

## 2015-03-05 DIAGNOSIS — Z5111 Encounter for antineoplastic chemotherapy: Secondary | ICD-10-CM

## 2015-03-05 DIAGNOSIS — D509 Iron deficiency anemia, unspecified: Secondary | ICD-10-CM | POA: Diagnosis not present

## 2015-03-05 LAB — COMPREHENSIVE METABOLIC PANEL
ALT: 22 U/L (ref 0–55)
ANION GAP: 9 meq/L (ref 3–11)
AST: 23 U/L (ref 5–34)
Albumin: 3.6 g/dL (ref 3.5–5.0)
Alkaline Phosphatase: 80 U/L (ref 40–150)
BUN: 7.5 mg/dL (ref 7.0–26.0)
CHLORIDE: 108 meq/L (ref 98–109)
CO2: 24 meq/L (ref 22–29)
CREATININE: 0.9 mg/dL (ref 0.6–1.1)
Calcium: 9.1 mg/dL (ref 8.4–10.4)
EGFR: 82 mL/min/{1.73_m2} — ABNORMAL LOW (ref >90)
Glucose: 114 mg/dl (ref 70–140)
Potassium: 3.7 mEq/L (ref 3.5–5.1)
Sodium: 141 mEq/L (ref 136–145)
Total Bilirubin: 0.52 mg/dL (ref 0.20–1.20)
Total Protein: 7.4 g/dL (ref 6.4–8.3)

## 2015-03-05 LAB — CBC WITH DIFFERENTIAL/PLATELET
BASO%: 1 % (ref 0.0–2.0)
Basophils Absolute: 0 10*3/uL (ref 0.0–0.1)
EOS%: 3.6 % (ref 0.0–7.0)
Eosinophils Absolute: 0.2 10*3/uL (ref 0.0–0.5)
HCT: 40.4 % (ref 34.8–46.6)
HGB: 13.2 g/dL (ref 11.6–15.9)
LYMPH%: 39.3 % (ref 14.0–49.7)
MCH: 27.7 pg (ref 25.1–34.0)
MCHC: 32.7 g/dL (ref 31.5–36.0)
MCV: 84.8 fL (ref 79.5–101.0)
MONO#: 0.6 10*3/uL (ref 0.1–0.9)
MONO%: 12.6 % (ref 0.0–14.0)
NEUT#: 2.2 10*3/uL (ref 1.5–6.5)
NEUT%: 43.5 % (ref 38.4–76.8)
PLATELETS: 190 10*3/uL (ref 145–400)
RBC: 4.77 10*6/uL (ref 3.70–5.45)
RDW: 24.5 % — ABNORMAL HIGH (ref 11.2–14.5)
WBC: 4.9 10*3/uL (ref 3.9–10.3)
lymph#: 1.9 10*3/uL (ref 0.9–3.3)

## 2015-03-05 MED ORDER — HEPARIN SOD (PORK) LOCK FLUSH 100 UNIT/ML IV SOLN
500.0000 [IU] | Freq: Once | INTRAVENOUS | Status: AC | PRN
  Administered 2015-03-05: 500 [IU]
  Filled 2015-03-05: qty 5

## 2015-03-05 MED ORDER — SODIUM CHLORIDE 0.9 % IJ SOLN
10.0000 mL | INTRAMUSCULAR | Status: DC | PRN
  Administered 2015-03-05: 10 mL
  Filled 2015-03-05: qty 10

## 2015-03-05 MED ORDER — PALONOSETRON HCL INJECTION 0.25 MG/5ML
INTRAVENOUS | Status: AC
  Filled 2015-03-05: qty 5

## 2015-03-05 MED ORDER — FOSAPREPITANT DIMEGLUMINE INJECTION 150 MG
Freq: Once | INTRAVENOUS | Status: AC
  Administered 2015-03-05: 11:00:00 via INTRAVENOUS
  Filled 2015-03-05: qty 5

## 2015-03-05 MED ORDER — OXALIPLATIN CHEMO INJECTION 100 MG/20ML
130.0000 mg/m2 | Freq: Once | INTRAVENOUS | Status: AC
  Administered 2015-03-05: 275 mg via INTRAVENOUS
  Filled 2015-03-05: qty 55

## 2015-03-05 MED ORDER — DEXTROSE 5 % IV SOLN
Freq: Once | INTRAVENOUS | Status: AC
  Administered 2015-03-05: 11:00:00 via INTRAVENOUS

## 2015-03-05 MED ORDER — PALONOSETRON HCL INJECTION 0.25 MG/5ML
0.2500 mg | Freq: Once | INTRAVENOUS | Status: AC
  Administered 2015-03-05: 0.25 mg via INTRAVENOUS

## 2015-03-05 NOTE — Patient Instructions (Signed)
[facility] Discharge Instructions for Patients Receiving Chemotherapy  Today you received the following chemotherapy agents:  Oxaliplatin  To help prevent nausea and vomiting after your treatment, we encourage you to take your nausea medication as prescribed.   If you develop nausea and vomiting that is not controlled by your nausea medication, call the clinic.   BELOW ARE SYMPTOMS THAT SHOULD BE REPORTED IMMEDIATELY:  *FEVER GREATER THAN 100.5 F  *CHILLS WITH OR WITHOUT FEVER  NAUSEA AND VOMITING THAT IS NOT CONTROLLED WITH YOUR NAUSEA MEDICATION  *UNUSUAL SHORTNESS OF BREATH  *UNUSUAL BRUISING OR BLEEDING  TENDERNESS IN MOUTH AND THROAT WITH OR WITHOUT PRESENCE OF ULCERS  *URINARY PROBLEMS  *BOWEL PROBLEMS  UNUSUAL RASH Items with * indicate a potential emergency and should be followed up as soon as possible.  Feel free to call the clinic you have any questions or concerns. The clinic phone number is [phone].  Please show the CHEMO ALERT CARD at check-in to the Emergency Department and triage nurse.   

## 2015-03-05 NOTE — Progress Notes (Signed)
  [place] OFFICE PROGRESS NOTE   Diagnosis:  Colon cancer  INTERVAL HISTORY:   Brenda Becker returns as scheduled. She completed cycle 3 CAPOX beginning 02/12/2015. She denies nausea/vomiting. No mouth sores. No diarrhea. No hand or foot pain or redness. Cold sensitivity lasted 6 or 7 days. No persistent neuropathy symptoms.  Objective:  Vital signs in last 24 hours:  Blood pressure 146/81, pulse 85, temperature 98.5 F (36.9 C), temperature source Oral, resp. rate 18, height 5\' 5"  (1.651 m), weight 229 lb 9.6 oz (104.146 kg), SpO2 100 %.    HEENT: No thrush or ulcers. Resp: Lungs clear bilaterally. Cardio: Regular rate and rhythm. GI: Abdomen soft and nontender. No hepatomegaly. Vascular: No leg edema. Neuro: Vibratory sense intact over the fingertips per tuning fork exam.  Skin: Palms with very mild erythema. No skin breakdown.  Port-A-Cath without erythema.  Lab Results:  Lab Results  Component Value Date   WBC 4.9 03/05/2015   HGB 13.2 03/05/2015   HCT 40.4 03/05/2015   MCV 84.8 03/05/2015   PLT 190 03/05/2015   NEUTROABS 2.2 03/05/2015    Imaging:  No results found.  Medications: I have reviewed the patient's current medications.  Assessment/Plan: 1. 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  Cycle 2 CAPOX 01/22/2015  Cycle 3 CAPOX 02/12/2015  Cycle 4 CAPOX 03/05/2015 2. Microcytic anemia-likely iron deficiency anemia secondary to #1 and ongoing menses 3. Status post cholecystectomy 4. Family history of breast and ovarian cancer. She has seen the genetics counselor. 5. Delayed nausea following cycle 1 CAPOX. Aloxi and Emend added beginning with cycle 2.    Disposition: [name] appears stable. She has completed 3 cycles of adjuvant CAPOX. Plan to proceed with cycle 4 today as scheduled. She will return for a follow-up visit and cycle 5 in 3 weeks.    [name]   03/05/2015  10:39 AM

## 2015-03-05 NOTE — Telephone Encounter (Signed)
Per Lattie Haw APP I have moved 3/24 appts to earlier in the day

## 2015-03-21 ENCOUNTER — Other Ambulatory Visit: Payer: Self-pay | Admitting: Oncology

## 2015-03-26 ENCOUNTER — Ambulatory Visit: Payer: PRIVATE HEALTH INSURANCE | Admitting: Nurse Practitioner

## 2015-03-26 ENCOUNTER — Ambulatory Visit: Payer: PRIVATE HEALTH INSURANCE

## 2015-03-26 ENCOUNTER — Other Ambulatory Visit (HOSPITAL_BASED_OUTPATIENT_CLINIC_OR_DEPARTMENT_OTHER): Payer: PRIVATE HEALTH INSURANCE

## 2015-03-26 ENCOUNTER — Telehealth: Payer: Self-pay | Admitting: Oncology

## 2015-03-26 ENCOUNTER — Telehealth: Payer: Self-pay | Admitting: Nurse Practitioner

## 2015-03-26 ENCOUNTER — Other Ambulatory Visit: Payer: PRIVATE HEALTH INSURANCE

## 2015-03-26 ENCOUNTER — Ambulatory Visit (HOSPITAL_BASED_OUTPATIENT_CLINIC_OR_DEPARTMENT_OTHER): Payer: PRIVATE HEALTH INSURANCE

## 2015-03-26 ENCOUNTER — Ambulatory Visit (HOSPITAL_BASED_OUTPATIENT_CLINIC_OR_DEPARTMENT_OTHER): Payer: PRIVATE HEALTH INSURANCE | Admitting: Nurse Practitioner

## 2015-03-26 VITALS — BP 135/82 | HR 82 | Temp 98.5°F | Resp 19 | Ht 65.0 in | Wt 233.6 lb

## 2015-03-26 DIAGNOSIS — C184 Malignant neoplasm of transverse colon: Secondary | ICD-10-CM

## 2015-03-26 DIAGNOSIS — Z5111 Encounter for antineoplastic chemotherapy: Secondary | ICD-10-CM

## 2015-03-26 DIAGNOSIS — D509 Iron deficiency anemia, unspecified: Secondary | ICD-10-CM | POA: Diagnosis not present

## 2015-03-26 DIAGNOSIS — C779 Secondary and unspecified malignant neoplasm of lymph node, unspecified: Secondary | ICD-10-CM

## 2015-03-26 LAB — COMPREHENSIVE METABOLIC PANEL
ALT: 25 U/L (ref 0–55)
AST: 26 U/L (ref 5–34)
Albumin: 3.7 g/dL (ref 3.5–5.0)
Alkaline Phosphatase: 78 U/L (ref 40–150)
Anion Gap: 9 mEq/L (ref 3–11)
BILIRUBIN TOTAL: 0.63 mg/dL (ref 0.20–1.20)
BUN: 9.3 mg/dL (ref 7.0–26.0)
CHLORIDE: 109 meq/L (ref 98–109)
CO2: 25 meq/L (ref 22–29)
CREATININE: 0.9 mg/dL (ref 0.6–1.1)
Calcium: 9.4 mg/dL (ref 8.4–10.4)
EGFR: 83 mL/min/{1.73_m2} — ABNORMAL LOW (ref >90)
GLUCOSE: 110 mg/dL (ref 70–140)
Potassium: 3.9 mEq/L (ref 3.5–5.1)
SODIUM: 143 meq/L (ref 136–145)
TOTAL PROTEIN: 7.5 g/dL (ref 6.4–8.3)

## 2015-03-26 LAB — CBC WITH DIFFERENTIAL/PLATELET
BASO%: 0.9 % (ref 0.0–2.0)
Basophils Absolute: 0 10*3/uL (ref 0.0–0.1)
EOS ABS: 0.2 10*3/uL (ref 0.0–0.5)
EOS%: 3.9 % (ref 0.0–7.0)
HCT: 40.5 % (ref 34.8–46.6)
HEMOGLOBIN: 13.4 g/dL (ref 11.6–15.9)
LYMPH#: 1.8 10*3/uL (ref 0.9–3.3)
LYMPH%: 36.8 % (ref 14.0–49.7)
MCH: 29.7 pg (ref 25.1–34.0)
MCHC: 33.1 g/dL (ref 31.5–36.0)
MCV: 89.6 fL (ref 79.5–101.0)
MONO#: 0.6 10*3/uL (ref 0.1–0.9)
MONO%: 12.9 % (ref 0.0–14.0)
NEUT%: 45.5 % (ref 38.4–76.8)
NEUTROS ABS: 2.2 10*3/uL (ref 1.5–6.5)
Platelets: 165 10*3/uL (ref 145–400)
RBC: 4.53 10*6/uL (ref 3.70–5.45)
RDW: 26.4 % — ABNORMAL HIGH (ref 11.2–14.5)
WBC: 4.8 10*3/uL (ref 3.9–10.3)

## 2015-03-26 MED ORDER — SODIUM CHLORIDE 0.9 % IV SOLN
Freq: Once | INTRAVENOUS | Status: AC
  Administered 2015-03-26: 10:00:00 via INTRAVENOUS
  Filled 2015-03-26: qty 5

## 2015-03-26 MED ORDER — HEPARIN SOD (PORK) LOCK FLUSH 100 UNIT/ML IV SOLN
500.0000 [IU] | Freq: Once | INTRAVENOUS | Status: AC | PRN
  Administered 2015-03-26: 500 [IU]
  Filled 2015-03-26: qty 5

## 2015-03-26 MED ORDER — OXALIPLATIN CHEMO INJECTION 100 MG/20ML
130.0000 mg/m2 | Freq: Once | INTRAVENOUS | Status: AC
  Administered 2015-03-26: 275 mg via INTRAVENOUS
  Filled 2015-03-26: qty 55

## 2015-03-26 MED ORDER — DEXTROSE 5 % IV SOLN
Freq: Once | INTRAVENOUS | Status: AC
  Administered 2015-03-26: 10:00:00 via INTRAVENOUS

## 2015-03-26 MED ORDER — PALONOSETRON HCL INJECTION 0.25 MG/5ML
0.2500 mg | Freq: Once | INTRAVENOUS | Status: AC
  Administered 2015-03-26: 0.25 mg via INTRAVENOUS

## 2015-03-26 MED ORDER — PALONOSETRON HCL INJECTION 0.25 MG/5ML
INTRAVENOUS | Status: AC
  Filled 2015-03-26: qty 5

## 2015-03-26 MED ORDER — SODIUM CHLORIDE 0.9 % IJ SOLN
10.0000 mL | INTRAMUSCULAR | Status: DC | PRN
  Administered 2015-03-26: 10 mL
  Filled 2015-03-26: qty 10

## 2015-03-26 NOTE — Telephone Encounter (Signed)
per pof to sch-sent MW emailt o sch trmt-pt to look on MY CHART for update

## 2015-03-26 NOTE — Patient Instructions (Signed)
[facility] Discharge Instructions for Patients Receiving Chemotherapy  Today you received the following chemotherapy agents oxaliplatin   To help prevent nausea and vomiting after your treatment, we encourage you to take your nausea medication as directed  If you develop nausea and vomiting that is not controlled by your nausea medication, call the clinic.   BELOW ARE SYMPTOMS THAT SHOULD BE REPORTED IMMEDIATELY:  *FEVER GREATER THAN 100.5 F  *CHILLS WITH OR WITHOUT FEVER  NAUSEA AND VOMITING THAT IS NOT CONTROLLED WITH YOUR NAUSEA MEDICATION  *UNUSUAL SHORTNESS OF BREATH  *UNUSUAL BRUISING OR BLEEDING  TENDERNESS IN MOUTH AND THROAT WITH OR WITHOUT PRESENCE OF ULCERS  *URINARY PROBLEMS  *BOWEL PROBLEMS  UNUSUAL RASH Items with * indicate a potential emergency and should be followed up as soon as possible.  Feel free to call the clinic you have any questions or concerns. The clinic phone number is [phone].  

## 2015-03-26 NOTE — Telephone Encounter (Signed)
Gave and printed appt sched and avs for pt for Brenda Becker and may °

## 2015-03-26 NOTE — Progress Notes (Signed)
  [place] OFFICE PROGRESS NOTE   Diagnosis:  Colon cancer  INTERVAL HISTORY:   Ms. Neally returns as scheduled. She completed cycle 4 CAPOX beginning 03/05/2015. She denies nausea/vomiting. No mouth sores. No diarrhea. The cold sensitivity lasted just under 2 weeks. No persistent neuropathy symptoms. No hand or foot pain or redness.  Objective:  Vital signs in last 24 hours:  Blood pressure 135/82, pulse 82, temperature 98.5 F (36.9 C), temperature source Oral, resp. rate 19, height 5\' 5"  (1.651 m), weight 233 lb 9.6 oz (105.96 kg), SpO2 99 %.    HEENT: No thrush or ulcers. Resp: Lungs clear bilaterally. Cardio: Regular rate and rhythm. GI: Abdomen soft and nontender. No hepatomegaly. Vascular: No leg edema. Neuro: Vibratory sense intact to very minimally decreased over the fingertips per tuning fork exam.  Skin: No rash.  Port-A-Cath without erythema.  Lab Results:  Lab Results  Component Value Date   WBC 4.8 03/26/2015   HGB 13.4 03/26/2015   HCT 40.5 03/26/2015   MCV 89.6 03/26/2015   PLT 165 03/26/2015   NEUTROABS 2.2 03/26/2015    Imaging:  No results found.  Medications: I have reviewed the patient's current medications.  Assessment/Plan: 1. 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  Cycle 2 CAPOX 01/22/2015  Cycle 3 CAPOX 02/12/2015  Cycle 4 CAPOX 03/05/2015 2. Microcytic anemia-likely iron deficiency anemia secondary to #1 and ongoing menses 3. Status post cholecystectomy 4. Family history of breast and ovarian cancer. She has seen the genetics counselor. 5. Delayed nausea following cycle 1 CAPOX. Aloxi and Emend added beginning with cycle 2.   Disposition: Ms. Cantrelle  appears stable. She has completed 4 cycles of adjuvant CAPOX. Plan to proceed with cycle 5 today as scheduled. She will return for a follow-up visit and cycle 6 in 3 weeks. She will contact the office in the interim with any problems.    [name]   03/26/2015  9:38 AM

## 2015-04-02 ENCOUNTER — Other Ambulatory Visit: Payer: Self-pay | Admitting: Oncology

## 2015-04-11 ENCOUNTER — Other Ambulatory Visit: Payer: Self-pay | Admitting: Oncology

## 2015-04-16 ENCOUNTER — Encounter: Payer: Self-pay | Admitting: *Deleted

## 2015-04-16 ENCOUNTER — Ambulatory Visit (HOSPITAL_BASED_OUTPATIENT_CLINIC_OR_DEPARTMENT_OTHER): Payer: PRIVATE HEALTH INSURANCE | Admitting: Oncology

## 2015-04-16 ENCOUNTER — Other Ambulatory Visit (HOSPITAL_BASED_OUTPATIENT_CLINIC_OR_DEPARTMENT_OTHER): Payer: PRIVATE HEALTH INSURANCE

## 2015-04-16 ENCOUNTER — Ambulatory Visit (HOSPITAL_BASED_OUTPATIENT_CLINIC_OR_DEPARTMENT_OTHER): Payer: PRIVATE HEALTH INSURANCE

## 2015-04-16 VITALS — BP 143/94 | HR 82 | Temp 99.0°F | Resp 18 | Ht 65.0 in | Wt 235.5 lb

## 2015-04-16 DIAGNOSIS — C184 Malignant neoplasm of transverse colon: Secondary | ICD-10-CM

## 2015-04-16 DIAGNOSIS — C779 Secondary and unspecified malignant neoplasm of lymph node, unspecified: Secondary | ICD-10-CM

## 2015-04-16 DIAGNOSIS — Z5111 Encounter for antineoplastic chemotherapy: Secondary | ICD-10-CM | POA: Diagnosis not present

## 2015-04-16 LAB — CBC WITH DIFFERENTIAL/PLATELET
BASO%: 0.3 % (ref 0.0–2.0)
BASOS ABS: 0 10*3/uL (ref 0.0–0.1)
EOS%: 4.6 % (ref 0.0–7.0)
Eosinophils Absolute: 0.2 10*3/uL (ref 0.0–0.5)
HEMATOCRIT: 40.6 % (ref 34.8–46.6)
HEMOGLOBIN: 13.4 g/dL (ref 11.6–15.9)
LYMPH#: 1.7 10*3/uL (ref 0.9–3.3)
LYMPH%: 39.4 % (ref 14.0–49.7)
MCH: 31.2 pg (ref 25.1–34.0)
MCHC: 32.9 g/dL (ref 31.5–36.0)
MCV: 95 fL (ref 79.5–101.0)
MONO#: 0.5 10*3/uL (ref 0.1–0.9)
MONO%: 11.8 % (ref 0.0–14.0)
NEUT%: 43.9 % (ref 38.4–76.8)
NEUTROS ABS: 1.9 10*3/uL (ref 1.5–6.5)
Platelets: 135 10*3/uL — ABNORMAL LOW (ref 145–400)
RBC: 4.28 10*6/uL (ref 3.70–5.45)
RDW: 23.6 % — AB (ref 11.2–14.5)
WBC: 4.2 10*3/uL (ref 3.9–10.3)

## 2015-04-16 LAB — COMPREHENSIVE METABOLIC PANEL
ALBUMIN: 3.6 g/dL (ref 3.5–5.0)
ALK PHOS: 71 U/L (ref 40–150)
ALT: 15 U/L (ref 0–55)
AST: 20 U/L (ref 5–34)
Anion Gap: 11 mEq/L (ref 3–11)
BILIRUBIN TOTAL: 0.73 mg/dL (ref 0.20–1.20)
BUN: 6.4 mg/dL — AB (ref 7.0–26.0)
CALCIUM: 9.5 mg/dL (ref 8.4–10.4)
CO2: 22 mEq/L (ref 22–29)
CREATININE: 0.9 mg/dL (ref 0.6–1.1)
Chloride: 108 mEq/L (ref 98–109)
EGFR: 79 mL/min/{1.73_m2} — ABNORMAL LOW (ref >90)
GLUCOSE: 145 mg/dL — AB (ref 70–140)
Potassium: 3.6 mEq/L (ref 3.5–5.1)
Sodium: 140 mEq/L (ref 136–145)
TOTAL PROTEIN: 7.3 g/dL (ref 6.4–8.3)

## 2015-04-16 MED ORDER — SODIUM CHLORIDE 0.9 % IV SOLN
Freq: Once | INTRAVENOUS | Status: DC
Start: 2015-04-16 — End: 2015-04-16

## 2015-04-16 MED ORDER — PALONOSETRON HCL INJECTION 0.25 MG/5ML
INTRAVENOUS | Status: AC
  Filled 2015-04-16: qty 5

## 2015-04-16 MED ORDER — SODIUM CHLORIDE 0.9 % IV SOLN
Freq: Once | INTRAVENOUS | Status: AC
  Administered 2015-04-16: 09:00:00 via INTRAVENOUS

## 2015-04-16 MED ORDER — SODIUM CHLORIDE 0.9 % IV SOLN
Freq: Once | INTRAVENOUS | Status: AC
  Administered 2015-04-16: 10:00:00 via INTRAVENOUS
  Filled 2015-04-16: qty 5

## 2015-04-16 MED ORDER — PALONOSETRON HCL INJECTION 0.25 MG/5ML
0.2500 mg | Freq: Once | INTRAVENOUS | Status: AC
  Administered 2015-04-16: 0.25 mg via INTRAVENOUS

## 2015-04-16 MED ORDER — DEXTROSE 5 % IV SOLN
130.0000 mg/m2 | Freq: Once | INTRAVENOUS | Status: AC
  Administered 2015-04-16: 275 mg via INTRAVENOUS
  Filled 2015-04-16: qty 55

## 2015-04-16 MED ORDER — SODIUM CHLORIDE 0.9 % IJ SOLN
10.0000 mL | INTRAMUSCULAR | Status: DC | PRN
  Administered 2015-04-16: 10 mL
  Filled 2015-04-16: qty 10

## 2015-04-16 MED ORDER — DEXTROSE 5 % IV SOLN
Freq: Once | INTRAVENOUS | Status: AC
  Administered 2015-04-16: 10:00:00 via INTRAVENOUS

## 2015-04-16 MED ORDER — HEPARIN SOD (PORK) LOCK FLUSH 100 UNIT/ML IV SOLN
500.0000 [IU] | Freq: Once | INTRAVENOUS | Status: AC | PRN
  Administered 2015-04-16: 500 [IU]
  Filled 2015-04-16: qty 5

## 2015-04-16 NOTE — Progress Notes (Signed)
Oncology Nurse Navigator Documentation  Oncology Nurse Navigator Flowsheets 04/16/2015  Navigator Location [facility]  Navigator Encounter Type Treatment  Treatment Initiated Date 12/30/2014  Patient Visit Type MedOnc  Treatment Phase Active Tx  Barriers/Navigation Needs No barriers at this time--xeloda copay after 2nd month was zero  Education -confirmed that fatigue can be cumulative in nature  Interventions None required  Referrals -  Education Method -  Support Groups/Services GI Support Group;Other--[facility] Support Calendar   Acuity Level 1  Time Spent with Patient -

## 2015-04-16 NOTE — Progress Notes (Signed)
  [facility] OFFICE PROGRESS NOTE   Diagnosis: Colon cancer  INTERVAL HISTORY:   Brenda Becker returns as scheduled. She completed another cycle of CAPOX beginning 03/26/2015. She has cold sensitivity for 2 weeks following oxaliplatin. This has resolved. No neuropathy symptoms at present. She had dyspnea lasting for a few minutes immediately after leaving the cancer Center 03/26/2015. The dyspnea spontaneously resolved. She had a similar episode after the previous cycle of chemotherapy when walking her dog in the cold weather. Brenda Becker reports feeling "dizzy "yesterday morning for a few minutes after getting out of bed. She had ataxia that resolved after lane down. No neurologic symptoms today.  No mouth sores, diarrhea, or hand/foot pain.  Objective:  Vital signs in last 24 hours:  Blood pressure 143/94, pulse 82, temperature 99 F (37.2 C), temperature source Oral, resp. rate 18, height 5\' 5"  (1.651 m), weight 235 lb 8 oz (106.822 kg), SpO2 99 %.    HEENT: No thrush or ulcers Resp: Lungs clear bilaterally Cardio: Regular rate and rhythm GI: No hepatosplenomegaly, nontender Vascular: No leg edema Neuro: Mild decrease in vibratory sense at the fingertips bilaterally  Skin: Mild hyperpigmentation of the hands, no erythema   Portacath/PICC-without erythema  Lab Results:  Lab Results  Component Value Date   WBC 4.2 04/16/2015   HGB 13.4 04/16/2015   HCT 40.6 04/16/2015   MCV 95.0 04/16/2015   PLT 135* 04/16/2015   NEUTROABS 1.9 04/16/2015    Medications: I have reviewed the patient's current medications.  Assessment/Plan: 1. 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  Cycle 2 CAPOX  01/22/2015  Cycle 3 CAPOX 02/12/2015  Cycle 4 CAPOX 03/05/2015  Cycle 5 CAPOX 03/26/2015 2. History of Microcytic anemia-likely iron deficiency anemia secondary to #1 and ongoing menses 3. Status post cholecystectomy 4. Family history of breast and ovarian cancer. She has seen the genetics counselor. 5. Delayed nausea following cycle 1 CAPOX. Aloxi and Emend added beginning with cycle 2. 6. Transient episodes of dyspnea following cycle 4 and cycle 5 CAPOX, likely neurotoxicity from oxaliplatin 7. Mild loss of vibratory sense at the fingertips   Disposition:  Brenda Becker has completed 5 cycles of CAPOX. The plan is to proceed with cycle 6 today. The episodes of transient dyspnea are likely related to oxaliplatin neurotoxicity. She will try to stay warm as she leaves the Cancer center today. I cannot relate the single episode of "dizziness "to the chemotherapy. She will contact us if this happens again.  Brenda Becker will return for an office visit and chemotherapy in 3 weeks. We will hold the oxaliplatin if she develops progressive neuropathy symptoms.  Betsy Coder, MD  04/16/2015  8:43 AM

## 2015-04-16 NOTE — Patient Instructions (Signed)
Oxaliplatin Injection What is this medicine? OXALIPLATIN (ox AL i PLA tin) is a chemotherapy drug. It targets fast dividing cells, like cancer cells, and causes these cells to die. This medicine is used to treat cancers of the colon and rectum, and many other cancers. This medicine may be used for other purposes; ask your health care provider or pharmacist if you have questions. What should I tell my health care provider before I take this medicine? They need to know if you have any of these conditions: -kidney disease -an unusual or allergic reaction to oxaliplatin, other chemotherapy, other medicines, foods, dyes, or preservatives -pregnant or trying to get pregnant -breast-feeding How should I use this medicine? This drug is given as an infusion into a vein. It is administered in a hospital or clinic by a specially trained health care professional. Talk to your pediatrician regarding the use of this medicine in children. Special care may be needed. Overdosage: If you think you have taken too much of this medicine contact a poison control center or emergency room at once. NOTE: This medicine is only for you. Do not share this medicine with others. What if I miss a dose? It is important not to miss a dose. Call your doctor or health care professional if you are unable to keep an appointment. What may interact with this medicine? -medicines to increase blood counts like filgrastim, pegfilgrastim, sargramostim -probenecid -some antibiotics like amikacin, gentamicin, neomycin, polymyxin B, streptomycin, tobramycin -zalcitabine Talk to your doctor or health care professional before taking any of these medicines: -acetaminophen -aspirin -ibuprofen -ketoprofen -naproxen This list may not describe all possible interactions. Give your health care provider a list of all the medicines, herbs, non-prescription drugs, or dietary supplements you use. Also tell them if you smoke, drink alcohol, or use  illegal drugs. Some items may interact with your medicine. What should I watch for while using this medicine? Your condition will be monitored carefully while you are receiving this medicine. You will need important blood work done while you are taking this medicine. This medicine can make you more sensitive to cold. Do not drink cold drinks or use ice. Cover exposed skin before coming in contact with cold temperatures or cold objects. When out in cold weather wear warm clothing and cover your mouth and nose to warm the air that goes into your lungs. Tell your doctor if you get sensitive to the cold. This drug may make you feel generally unwell. This is not uncommon, as chemotherapy can affect healthy cells as well as cancer cells. Report any side effects. Continue your course of treatment even though you feel ill unless your doctor tells you to stop. In some cases, you may be given additional medicines to help with side effects. Follow all directions for their use. Call your doctor or health care professional for advice if you get a fever, chills or sore throat, or other symptoms of a cold or flu. Do not treat yourself. This drug decreases your body's ability to fight infections. Try to avoid being around people who are sick. This medicine may increase your risk to bruise or bleed. Call your doctor or health care professional if you notice any unusual bleeding. Be careful brushing and flossing your teeth or using a toothpick because you may get an infection or bleed more easily. If you have any dental work done, tell your dentist you are receiving this medicine. Avoid taking products that contain aspirin, acetaminophen, ibuprofen, naproxen, or ketoprofen unless instructed  may get an infection or bleed more easily. If you have any dental work done, tell your dentist you are receiving this medicine.  Avoid taking products that contain aspirin, acetaminophen, ibuprofen, naproxen, or ketoprofen unless instructed by your doctor. These medicines may hide a fever.  Do not become pregnant while taking this medicine. Women should inform their doctor if they wish to become pregnant or think they might be pregnant. There is a potential for serious side  effects to an unborn child. Talk to your health care professional or pharmacist for more information. Do not breast-feed an infant while taking this medicine.  Call your doctor or health care professional if you get diarrhea. Do not treat yourself.  What side effects may I notice from receiving this medicine?  Side effects that you should report to your doctor or health care professional as soon as possible:  -allergic reactions like skin rash, itching or hives, swelling of the face, lips, or tongue  -low blood counts - This drug may decrease the number of white blood cells, red blood cells and platelets. You may be at increased risk for infections and bleeding.  -signs of infection - fever or chills, cough, sore throat, pain or difficulty passing urine  -signs of decreased platelets or bleeding - bruising, pinpoint red spots on the skin, black, tarry stools, nosebleeds  -signs of decreased red blood cells - unusually weak or tired, fainting spells, lightheadedness  -breathing problems  -chest pain, pressure  -cough  -diarrhea  -jaw tightness  -mouth sores  -nausea and vomiting  -pain, swelling, redness or irritation at the injection site  -pain, tingling, numbness in the hands or feet  -problems with balance, talking, walking  -redness, blistering, peeling or loosening of the skin, including inside the mouth  -trouble passing urine or change in the amount of urine  Side effects that usually do not require medical attention (report to your doctor or health care professional if they continue or are bothersome):  -changes in vision  -constipation  -hair loss  -loss of appetite  -metallic taste in the mouth or changes in taste  -stomach pain  This list may not describe all possible side effects. Call your doctor for medical advice about side effects. You may report side effects to FDA at 1-800-FDA-1088.  Where should I keep my medicine?  This drug is given in a hospital or clinic and will not be stored at home.  NOTE:  This sheet is a summary. It may not cover all possible information. If you have questions about this medicine, talk to your doctor, pharmacist, or health care provider.      2016, Elsevier/Gold Standard. (2007-07-16 17:22:47)

## 2015-05-02 ENCOUNTER — Other Ambulatory Visit: Payer: Self-pay | Admitting: Oncology

## 2015-05-05 ENCOUNTER — Encounter: Payer: Self-pay | Admitting: Oncology

## 2015-05-05 NOTE — Progress Notes (Signed)
Fax from 12/11/14 I sent to medical records--fmla

## 2015-05-07 ENCOUNTER — Other Ambulatory Visit (HOSPITAL_BASED_OUTPATIENT_CLINIC_OR_DEPARTMENT_OTHER): Payer: PRIVATE HEALTH INSURANCE

## 2015-05-07 ENCOUNTER — Ambulatory Visit: Payer: PRIVATE HEALTH INSURANCE

## 2015-05-07 ENCOUNTER — Ambulatory Visit (HOSPITAL_BASED_OUTPATIENT_CLINIC_OR_DEPARTMENT_OTHER): Payer: PRIVATE HEALTH INSURANCE | Admitting: Nurse Practitioner

## 2015-05-07 VITALS — BP 142/82 | HR 83 | Temp 98.7°F | Resp 17 | Ht 65.0 in | Wt 237.7 lb

## 2015-05-07 DIAGNOSIS — C184 Malignant neoplasm of transverse colon: Secondary | ICD-10-CM

## 2015-05-07 DIAGNOSIS — G629 Polyneuropathy, unspecified: Secondary | ICD-10-CM

## 2015-05-07 LAB — COMPREHENSIVE METABOLIC PANEL
ALK PHOS: 69 U/L (ref 40–150)
ALT: 19 U/L (ref 0–55)
AST: 24 U/L (ref 5–34)
Albumin: 3.7 g/dL (ref 3.5–5.0)
Anion Gap: 7 mEq/L (ref 3–11)
BUN: 5.7 mg/dL — AB (ref 7.0–26.0)
CHLORIDE: 110 meq/L — AB (ref 98–109)
CO2: 25 mEq/L (ref 22–29)
CREATININE: 0.9 mg/dL (ref 0.6–1.1)
Calcium: 9.3 mg/dL (ref 8.4–10.4)
EGFR: 80 mL/min/{1.73_m2} — ABNORMAL LOW (ref >90)
Glucose: 151 mg/dl — ABNORMAL HIGH (ref 70–140)
Potassium: 4.1 mEq/L (ref 3.5–5.1)
SODIUM: 141 meq/L (ref 136–145)
TOTAL PROTEIN: 7.4 g/dL (ref 6.4–8.3)
Total Bilirubin: 0.61 mg/dL (ref 0.20–1.20)

## 2015-05-07 LAB — CBC WITH DIFFERENTIAL/PLATELET
BASO%: 0.7 % (ref 0.0–2.0)
Basophils Absolute: 0 10*3/uL (ref 0.0–0.1)
EOS%: 3.1 % (ref 0.0–7.0)
Eosinophils Absolute: 0.2 10*3/uL (ref 0.0–0.5)
HCT: 40.9 % (ref 34.8–46.6)
HGB: 13.3 g/dL (ref 11.6–15.9)
LYMPH%: 40.8 % (ref 14.0–49.7)
MCH: 31.7 pg (ref 25.1–34.0)
MCHC: 32.4 g/dL (ref 31.5–36.0)
MCV: 97.7 fL (ref 79.5–101.0)
MONO#: 0.6 10*3/uL (ref 0.1–0.9)
MONO%: 12.8 % (ref 0.0–14.0)
NEUT%: 42.6 % (ref 38.4–76.8)
NEUTROS ABS: 2.1 10*3/uL (ref 1.5–6.5)
Platelets: 111 10*3/uL — ABNORMAL LOW (ref 145–400)
RBC: 4.18 10*6/uL (ref 3.70–5.45)
RDW: 20.2 % — ABNORMAL HIGH (ref 11.2–14.5)
WBC: 4.8 10*3/uL (ref 3.9–10.3)
lymph#: 2 10*3/uL (ref 0.9–3.3)

## 2015-05-07 NOTE — Progress Notes (Signed)
  [facility] OFFICE PROGRESS NOTE   Diagnosis:  Colon cancer  INTERVAL HISTORY:   Brenda Becker returns as scheduled. She completed cycle 6 CAPOX beginning 04/16/2015. She denies nausea/vomiting. No mouth sores. No diarrhea. She has persistent cold sensitivity. She has a persistent cold sensation in both feet unrelated to cold exposure. She notes tingling with movement of her toes. No numbness or tingling in her hands. No dyspnea following the last treatment.  Objective:  Vital signs in last 24 hours:  Blood pressure 142/82, pulse 83, temperature 98.7 F (37.1 C), temperature source Oral, resp. rate 17, height 5\' 5"  (1.651 m), weight 237 lb 11.2 oz (107.82 kg), SpO2 100 %.    HEENT: No thrush or ulcers. Resp: Lungs clear bilaterally. Cardio: Regular rate and rhythm. GI: Abdomen soft and nontender. No hepatomegaly. Vascular: No leg edema. Neuro: Vibratory sense mildly decreased over the fingertips per tuning fork exam. Vibratory sense moderately to severely decreased over the toes per tuning fork exam.  Skin: No rash.  Port-A-Cath without erythema.  Lab Results:  Lab Results  Component Value Date   WBC 4.8 05/07/2015   HGB 13.3 05/07/2015   HCT 40.9 05/07/2015   MCV 97.7 05/07/2015   PLT 111* 05/07/2015   NEUTROABS 2.1 05/07/2015    Imaging:  No results found.  Medications: I have reviewed the patient's current medications.  Assessment/Plan: 1. 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 History of Microcytic anemia-likely iron deficiency anemia secondary to #1 and ongoing menses 3. Status post cholecystectomy 4. Family history of breast and ovarian cancer. She has seen the genetics counselor. 5. Delayed nausea following cycle 1 CAPOX. Aloxi and Emend added beginning with cycle 2. 6. Transient episodes of dyspnea following cycle 4 and cycle 5 CAPOX, likely neurotoxicity from oxaliplatin 7. Mild loss of vibratory sense at the fingertips 05/07/2015; moderate to severe loss of vibratory sense at the toes/persistent cold sensation in both feet 05/07/2015    Disposition: [name] has completed 6 cycles of CAPOX chemotherapy. She has progressive neuropathy symptoms today. We will hold oxaliplatin with cycle 7. She will complete the Xeloda portion of cycle 7 as planned.  She will return for a follow-up visit and cycle 8 in 3 weeks. We will consider resuming the oxaliplatin with cycle 8 pending evaluation of neuropathy symptoms.    [name]   05/07/2015  10:17 AM

## 2015-05-14 ENCOUNTER — Other Ambulatory Visit: Payer: Self-pay | Admitting: Oncology

## 2015-05-14 ENCOUNTER — Other Ambulatory Visit: Payer: Self-pay | Admitting: *Deleted

## 2015-05-18 ENCOUNTER — Encounter: Payer: Self-pay | Admitting: Oncology

## 2015-05-18 NOTE — Progress Notes (Signed)
Fax sent 02/2015 I sent to medical records

## 2015-05-23 ENCOUNTER — Other Ambulatory Visit: Payer: Self-pay | Admitting: Oncology

## 2015-05-28 ENCOUNTER — Ambulatory Visit: Payer: PRIVATE HEALTH INSURANCE

## 2015-05-28 ENCOUNTER — Encounter: Payer: Self-pay | Admitting: *Deleted

## 2015-05-28 ENCOUNTER — Telehealth: Payer: Self-pay | Admitting: Oncology

## 2015-05-28 ENCOUNTER — Other Ambulatory Visit (HOSPITAL_BASED_OUTPATIENT_CLINIC_OR_DEPARTMENT_OTHER): Payer: PRIVATE HEALTH INSURANCE

## 2015-05-28 ENCOUNTER — Ambulatory Visit (HOSPITAL_BASED_OUTPATIENT_CLINIC_OR_DEPARTMENT_OTHER): Payer: PRIVATE HEALTH INSURANCE | Admitting: Oncology

## 2015-05-28 VITALS — BP 156/89 | HR 77 | Temp 98.7°F | Resp 18 | Ht 65.0 in | Wt 238.9 lb

## 2015-05-28 DIAGNOSIS — C184 Malignant neoplasm of transverse colon: Secondary | ICD-10-CM | POA: Diagnosis not present

## 2015-05-28 DIAGNOSIS — G620 Drug-induced polyneuropathy: Secondary | ICD-10-CM | POA: Diagnosis not present

## 2015-05-28 LAB — COMPREHENSIVE METABOLIC PANEL
ALT: 18 U/L (ref 0–55)
AST: 19 U/L (ref 5–34)
Albumin: 3.9 g/dL (ref 3.5–5.0)
Alkaline Phosphatase: 76 U/L (ref 40–150)
Anion Gap: 11 mEq/L (ref 3–11)
BUN: 5.1 mg/dL — AB (ref 7.0–26.0)
CHLORIDE: 107 meq/L (ref 98–109)
CO2: 24 meq/L (ref 22–29)
CREATININE: 0.8 mg/dL (ref 0.6–1.1)
Calcium: 9.5 mg/dL (ref 8.4–10.4)
EGFR: 88 mL/min/{1.73_m2} — ABNORMAL LOW (ref >90)
GLUCOSE: 99 mg/dL (ref 70–140)
Potassium: 3.8 mEq/L (ref 3.5–5.1)
SODIUM: 141 meq/L (ref 136–145)
Total Bilirubin: 0.78 mg/dL (ref 0.20–1.20)
Total Protein: 7.6 g/dL (ref 6.4–8.3)

## 2015-05-28 LAB — CBC WITH DIFFERENTIAL/PLATELET
BASO%: 0.6 % (ref 0.0–2.0)
Basophils Absolute: 0 10*3/uL (ref 0.0–0.1)
EOS%: 5 % (ref 0.0–7.0)
Eosinophils Absolute: 0.3 10*3/uL (ref 0.0–0.5)
HEMATOCRIT: 38.9 % (ref 34.8–46.6)
HGB: 13.3 g/dL (ref 11.6–15.9)
LYMPH#: 2.1 10*3/uL (ref 0.9–3.3)
LYMPH%: 38 % (ref 14.0–49.7)
MCH: 32.6 pg (ref 25.1–34.0)
MCHC: 34.2 g/dL (ref 31.5–36.0)
MCV: 95.3 fL (ref 79.5–101.0)
MONO#: 0.5 10*3/uL (ref 0.1–0.9)
MONO%: 8.8 % (ref 0.0–14.0)
NEUT#: 2.6 10*3/uL (ref 1.5–6.5)
NEUT%: 47.6 % (ref 38.4–76.8)
Platelets: 165 10*3/uL (ref 145–400)
RBC: 4.08 10*6/uL (ref 3.70–5.45)
RDW: 17.1 % — ABNORMAL HIGH (ref 11.2–14.5)
WBC: 5.5 10*3/uL (ref 3.9–10.3)

## 2015-05-28 MED ORDER — HEPARIN SOD (PORK) LOCK FLUSH 100 UNIT/ML IV SOLN
500.0000 [IU] | Freq: Once | INTRAVENOUS | Status: AC
  Administered 2015-05-28: 500 [IU] via INTRAVENOUS
  Filled 2015-05-28: qty 5

## 2015-05-28 MED ORDER — SODIUM CHLORIDE 0.9% FLUSH
10.0000 mL | INTRAVENOUS | Status: DC | PRN
  Administered 2015-05-28: 10 mL via INTRAVENOUS
  Filled 2015-05-28: qty 10

## 2015-05-28 NOTE — Telephone Encounter (Signed)
per pof to sch pt appt-gave pt copy of avs °

## 2015-05-28 NOTE — Progress Notes (Signed)
  Brenda Becker   Diagnosis: Colon cancer  INTERVAL HISTORY:   Brenda Becker returns as scheduled. She completed another cycle of capecitabine beginning on 05/07/2015. No mouth sores or diarrhea. She reports mild erythema at the hands and feet. This is not painful. She has noted progressive neuropathy symptoms with tingling of the feet and numbness in the hands. This does not interfere with activity at present.  Objective:  Vital signs in last 24 hours:  Blood pressure 156/89, pulse 77, temperature 98.7 F (37.1 C), temperature source Oral, resp. rate 18, height 5\' 5"  (1.651 m), weight 238 lb 14.4 oz (108.364 kg), SpO2 100 %.    HEENT: No thrush or ulcers Lymphatics: No cervical, supraclavicular, axillary, or inguinal nodes Resp: Lungs clear bilaterally Cardio: Regular rate and rhythm GI: No hepatosplenic the, no mass, nontender Vascular: No leg edema Neuro: Mild loss of vibratory sense at the fingertips bilaterally  Skin: Mild hyperpigmentation of the palms and soles. No skin breakdown.   Portacath/PICC-without erythema  Lab Results:  Lab Results  Component Value Date   WBC 5.5 05/28/2015   HGB 13.3 05/28/2015   HCT 38.9 05/28/2015   MCV 95.3 05/28/2015   PLT 165 05/28/2015   NEUTROABS 2.6 05/28/2015     Medications: I have reviewed the patient's current medications.  Assessment/Plan: 1. 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  Cycle 2 CAPOX 01/22/2015  Cycle 3 CAPOX 02/12/2015  Cycle 4 CAPOX 03/05/2015  Cycle 5 CAPOX 03/26/2015  Cycle 6 CAPOX 04/16/2015  Cycle 7 CAPOX 05/07/2015 (oxaliplatin held due to progressive neuropathy)  Cycle 8 CAPOX 05/28/2015  (oxaliplatin held secondary to neuropathy) 2. History of Microcytic anemia-likely iron deficiency anemia secondary to #1 and ongoing menses 3. Status post cholecystectomy 4. Family history of breast and ovarian cancer. She has seen the genetics counselor. 5. Delayed nausea following cycle 1 CAPOX. Aloxi and Emend added beginning with cycle 2. 6. Transient episodes of dyspnea following cycle 4 and cycle 5 CAPOX, likely neurotoxicity from oxaliplatin 7. Oxaliplatin neuropathy   Disposition:  Brenda Becker has progressive neuropathy symptoms. She will complete cycle 8 adjuvant chemotherapy today. Oxaliplatin will be deleted from this cycle.  We will follow-up on the CEA from today. She will return for an office visit and Port-A-Cath flush in 6 weeks. We will decide on removal of the Port-A-Cath at the 6 week visit.  She will be due for a restaging CT evaluation in October.   Brenda Coder, MD  05/28/2015  9:42 AM

## 2015-05-28 NOTE — Addendum Note (Signed)
Addended by: San Morelle on: 05/28/2015 09:59 AM   Modules accepted: Orders, SmartSet

## 2015-05-28 NOTE — Progress Notes (Signed)
  Oncology Nurse Navigator Documentation  Navigator Location: [facility] (05/28/15 1600) Navigator Encounter Type: Letter/Fax/Email (05/28/15 1600)    Was unable to see patient today at MD visit. Her oxaliplatin was discontinued, but will complete 2 weeks of Xeloda. This is her last treatment cycle. Mailed her information on [facility] with start date of 9/11 for next set of sessions with note encouraging her to register.

## 2015-05-29 LAB — CEA: CEA1: 1.3 ng/mL (ref 0.0–4.7)

## 2015-05-29 LAB — CEA (PARALLEL TESTING)

## 2015-06-07 ENCOUNTER — Encounter: Payer: Self-pay | Admitting: Oncology

## 2015-06-07 NOTE — Progress Notes (Signed)
left xeloda prior auth form for dr. Ammie Dalton to sign

## 2015-06-10 ENCOUNTER — Encounter: Payer: Self-pay | Admitting: Oncology

## 2015-06-10 NOTE — Progress Notes (Signed)
left xeloda prior auth form for dr. Ammie Dalton to sign-. faxed   [phone]

## 2015-06-16 ENCOUNTER — Encounter: Payer: Self-pay | Admitting: Oncology

## 2015-06-16 NOTE — Progress Notes (Signed)
Per [facility] xeloda denied. I sent medical notes for appeal   [phone]

## 2015-06-21 ENCOUNTER — Encounter: Payer: Self-pay | Admitting: Oncology

## 2015-06-21 NOTE — Progress Notes (Signed)
left xeloda prior auth form for dr. Ammie Dalton to sign-. faxed&nbsp;&nbsp; [phone]- faxed appeal notes 06/16/15- approved expires 12/13/15-sent to medical records

## 2015-06-29 ENCOUNTER — Other Ambulatory Visit: Payer: Self-pay | Admitting: Nurse Practitioner

## 2015-07-02 ENCOUNTER — Telehealth: Payer: Self-pay | Admitting: *Deleted

## 2015-07-02 NOTE — Telephone Encounter (Signed)
  Oncology Nurse Navigator Documentation  Navigator Location: [facility] (07/02/15 1131) Navigator Encounter Type: Telephone (07/02/15 1131) Telephone: Outgoing Call;Patient Update (07/02/15 1131)   Attempted to reach Brenda Becker to f/u on her status and confirm she received the [facility] information that was mailed to her. Provided direct contact # for navigator for any questions or concerns.

## 2015-07-08 ENCOUNTER — Encounter: Payer: Self-pay | Admitting: Obstetrics & Gynecology

## 2015-07-09 ENCOUNTER — Ambulatory Visit: Payer: PRIVATE HEALTH INSURANCE

## 2015-07-09 ENCOUNTER — Telehealth: Payer: Self-pay | Admitting: Nurse Practitioner

## 2015-07-09 ENCOUNTER — Ambulatory Visit (HOSPITAL_BASED_OUTPATIENT_CLINIC_OR_DEPARTMENT_OTHER): Payer: PRIVATE HEALTH INSURANCE | Admitting: Nurse Practitioner

## 2015-07-09 VITALS — BP 126/79 | HR 82 | Temp 98.8°F | Resp 20 | Ht 65.0 in | Wt 239.4 lb

## 2015-07-09 DIAGNOSIS — C779 Secondary and unspecified malignant neoplasm of lymph node, unspecified: Secondary | ICD-10-CM

## 2015-07-09 DIAGNOSIS — C184 Malignant neoplasm of transverse colon: Secondary | ICD-10-CM | POA: Diagnosis not present

## 2015-07-09 DIAGNOSIS — Z95828 Presence of other vascular implants and grafts: Secondary | ICD-10-CM

## 2015-07-09 LAB — HM MAMMOGRAPHY: HM Mammogram: NORMAL (ref 0–4)

## 2015-07-09 LAB — HM PAP SMEAR: HM Pap smear: NORMAL

## 2015-07-09 MED ORDER — HEPARIN SOD (PORK) LOCK FLUSH 100 UNIT/ML IV SOLN
500.0000 [IU] | Freq: Once | INTRAVENOUS | Status: AC
  Administered 2015-07-09: 500 [IU] via INTRAVENOUS
  Filled 2015-07-09: qty 5

## 2015-07-09 MED ORDER — SODIUM CHLORIDE 0.9% FLUSH
10.0000 mL | INTRAVENOUS | Status: DC | PRN
  Administered 2015-07-09: 10 mL via INTRAVENOUS
  Filled 2015-07-09: qty 10

## 2015-07-09 NOTE — Patient Instructions (Signed)

## 2015-07-09 NOTE — Progress Notes (Signed)
  [place] OFFICE PROGRESS NOTE   Diagnosis:  Colon cancer  INTERVAL HISTORY:   Brenda Becker returns as scheduled. She completed cycle 8 of adjuvant CAPOX beginning 05/28/2015. She denies nausea/vomiting. No mouth sores. Bowels overall moving regularly. She had an episode of loose stools yesterday. No bleeding. No abdominal pain. She has stable tingling in the fingertips and toes. No interference with activity. She reports a good appetite.  Objective:  Vital signs in last 24 hours:  Blood pressure 126/79, pulse 82, temperature 98.8 F (37.1 C), temperature source Oral, resp. rate 20, height 5\' 5"  (1.651 m), weight 239 lb 6.4 oz (108.591 kg), SpO2 100 %.    HEENT: No thrush or ulcers. Lymphatics: No palpable cervical, supra clavicular, axillary or inguinal lymph nodes. Resp: Lungs clear bilaterally. Cardio: Regular rate and rhythm. GI: Abdomen soft and nontender. No hepatomegaly. No mass. Vascular: No leg edema. Neuro: Mild decrease in vibratory sense over the fingertips per tuning fork exam.  Skin: No rash. Port-A-Cath without erythema.    Lab Results:  Lab Results  Component Value Date   WBC 5.5 05/28/2015   HGB 13.3 05/28/2015   HCT 38.9 05/28/2015   MCV 95.3 05/28/2015   PLT 165 05/28/2015   NEUTROABS 2.6 05/28/2015    Imaging:  No results found.  Medications: I have reviewed the patient's current medications.  Assessment/Plan: 1. 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  Cycle 2 CAPOX 01/22/2015  Cycle 3 CAPOX 02/12/2015  Cycle 4 CAPOX 03/05/2015  Cycle 5 CAPOX 03/26/2015  Cycle 6 CAPOX 04/16/2015  Cycle 7 CAPOX 05/07/2015 (oxaliplatin held due to progressive  neuropathy)  Cycle 8 CAPOX 05/28/2015 (oxaliplatin held secondary to neuropathy) 2. History of Microcytic anemia-likely iron deficiency anemia secondary to #1 and ongoing menses 3. Status post cholecystectomy 4. Family history of breast and ovarian cancer. She has seen the genetics counselor. 5. Delayed nausea following cycle 1 CAPOX. Aloxi and Emend added beginning with cycle 2. 6. Transient episodes of dyspnea following cycle 4 and cycle 5 CAPOX, likely neurotoxicity from oxaliplatin 7. Oxaliplatin neuropathy   Disposition:Brenda Becker appears well. She has completed the course of adjuvant chemotherapy. She will return in 6 and 12 weeks for a Port-A-Cath flush. We will obtain restaging CT scans 11/01/2015. She will return for a follow-up visit a few days after the scans to review the results. She reports she will be having a colonoscopy with Dr. Morton Stall in November of this year.  She will contact the office prior to her next visit with any problems.  Plan reviewed with Dr. [name].    [name]   07/09/2015  9:43 AM

## 2015-07-09 NOTE — Telephone Encounter (Signed)
per pof to sch pt appt-gave pt copy of avs °

## 2015-08-20 ENCOUNTER — Ambulatory Visit (HOSPITAL_BASED_OUTPATIENT_CLINIC_OR_DEPARTMENT_OTHER): Payer: PRIVATE HEALTH INSURANCE

## 2015-08-20 DIAGNOSIS — Z95828 Presence of other vascular implants and grafts: Secondary | ICD-10-CM | POA: Insufficient documentation

## 2015-08-20 DIAGNOSIS — Z452 Encounter for adjustment and management of vascular access device: Secondary | ICD-10-CM | POA: Diagnosis not present

## 2015-08-20 DIAGNOSIS — C184 Malignant neoplasm of transverse colon: Secondary | ICD-10-CM

## 2015-08-20 DIAGNOSIS — C779 Secondary and unspecified malignant neoplasm of lymph node, unspecified: Secondary | ICD-10-CM | POA: Diagnosis not present

## 2015-08-20 HISTORY — DX: Presence of other vascular implants and grafts: Z95.828

## 2015-08-20 MED ORDER — HEPARIN SOD (PORK) LOCK FLUSH 100 UNIT/ML IV SOLN
500.0000 [IU] | Freq: Once | INTRAVENOUS | Status: AC | PRN
  Administered 2015-08-20: 500 [IU] via INTRAVENOUS
  Filled 2015-08-20: qty 5

## 2015-08-20 MED ORDER — SODIUM CHLORIDE 0.9 % IJ SOLN
10.0000 mL | INTRAMUSCULAR | Status: DC | PRN
  Administered 2015-08-20: 10 mL via INTRAVENOUS
  Filled 2015-08-20: qty 10

## 2015-10-01 ENCOUNTER — Ambulatory Visit (HOSPITAL_BASED_OUTPATIENT_CLINIC_OR_DEPARTMENT_OTHER): Payer: PRIVATE HEALTH INSURANCE

## 2015-10-01 DIAGNOSIS — Z452 Encounter for adjustment and management of vascular access device: Secondary | ICD-10-CM | POA: Diagnosis not present

## 2015-10-01 DIAGNOSIS — C779 Secondary and unspecified malignant neoplasm of lymph node, unspecified: Secondary | ICD-10-CM

## 2015-10-01 DIAGNOSIS — C184 Malignant neoplasm of transverse colon: Secondary | ICD-10-CM

## 2015-10-01 DIAGNOSIS — Z95828 Presence of other vascular implants and grafts: Secondary | ICD-10-CM

## 2015-10-01 MED ORDER — SODIUM CHLORIDE 0.9 % IJ SOLN
10.0000 mL | INTRAMUSCULAR | Status: DC | PRN
  Administered 2015-10-01: 10 mL via INTRAVENOUS
  Filled 2015-10-01: qty 10

## 2015-10-01 MED ORDER — HEPARIN SOD (PORK) LOCK FLUSH 100 UNIT/ML IV SOLN
500.0000 [IU] | Freq: Once | INTRAVENOUS | Status: AC | PRN
  Administered 2015-10-01: 500 [IU] via INTRAVENOUS
  Filled 2015-10-01: qty 5

## 2015-11-01 ENCOUNTER — Ambulatory Visit (HOSPITAL_COMMUNITY)
Admission: RE | Admit: 2015-11-01 | Discharge: 2015-11-01 | Disposition: A | Discharge status: Home / Self Care | Payer: PRIVATE HEALTH INSURANCE | Source: Ambulatory Visit | Attending: Nurse Practitioner | Admitting: Nurse Practitioner

## 2015-11-01 ENCOUNTER — Encounter (HOSPITAL_COMMUNITY): Payer: Self-pay

## 2015-11-01 ENCOUNTER — Ambulatory Visit: Payer: PRIVATE HEALTH INSURANCE

## 2015-11-01 ENCOUNTER — Other Ambulatory Visit (HOSPITAL_BASED_OUTPATIENT_CLINIC_OR_DEPARTMENT_OTHER): Payer: PRIVATE HEALTH INSURANCE

## 2015-11-01 DIAGNOSIS — C779 Secondary and unspecified malignant neoplasm of lymph node, unspecified: Secondary | ICD-10-CM

## 2015-11-01 DIAGNOSIS — C184 Malignant neoplasm of transverse colon: Secondary | ICD-10-CM | POA: Insufficient documentation

## 2015-11-01 DIAGNOSIS — D259 Leiomyoma of uterus, unspecified: Secondary | ICD-10-CM | POA: Diagnosis not present

## 2015-11-01 DIAGNOSIS — Z95828 Presence of other vascular implants and grafts: Secondary | ICD-10-CM

## 2015-11-01 DIAGNOSIS — K760 Fatty (change of) liver, not elsewhere classified: Secondary | ICD-10-CM | POA: Diagnosis not present

## 2015-11-01 LAB — CEA (IN HOUSE-CHCC): CEA (CHCC-In House): <1 ng/mL (ref 0.00–5.00)

## 2015-11-01 LAB — COMPREHENSIVE METABOLIC PANEL
ALT: 22 U/L (ref 0–55)
ANION GAP: 12 meq/L — AB (ref 3–11)
AST: 22 U/L (ref 5–34)
Albumin: 3.7 g/dL (ref 3.5–5.0)
Alkaline Phosphatase: 91 U/L (ref 40–150)
BILIRUBIN TOTAL: 0.59 mg/dL (ref 0.20–1.20)
BUN: 6.8 mg/dL — ABNORMAL LOW (ref 7.0–26.0)
CALCIUM: 9.3 mg/dL (ref 8.4–10.4)
CHLORIDE: 105 meq/L (ref 98–109)
CO2: 22 meq/L (ref 22–29)
CREATININE: 0.8 mg/dL (ref 0.6–1.1)
EGFR: 84 mL/min/{1.73_m2} — AB (ref >90)
Glucose: 143 mg/dl — ABNORMAL HIGH (ref 70–140)
Potassium: 3.4 mEq/L — ABNORMAL LOW (ref 3.5–5.1)
Sodium: 139 mEq/L (ref 136–145)
TOTAL PROTEIN: 7.5 g/dL (ref 6.4–8.3)

## 2015-11-01 MED ORDER — HEPARIN SOD (PORK) LOCK FLUSH 100 UNIT/ML IV SOLN
500.0000 [IU] | Freq: Once | INTRAVENOUS | Status: AC | PRN
  Administered 2015-11-01: 500 [IU] via INTRAVENOUS
  Filled 2015-11-01: qty 5

## 2015-11-01 MED ORDER — IOPAMIDOL (ISOVUE-300) INJECTION 61%
100.0000 mL | Freq: Once | INTRAVENOUS | Status: AC | PRN
  Administered 2015-11-01: 100 mL via INTRAVENOUS

## 2015-11-01 MED ORDER — SODIUM CHLORIDE 0.9 % IJ SOLN
10.0000 mL | INTRAMUSCULAR | Status: DC | PRN
Start: 2015-11-01 — End: 2015-11-01
  Administered 2015-11-01: 10 mL via INTRAVENOUS
  Filled 2015-11-01: qty 10

## 2015-11-02 LAB — CEA: CEA1: 0.9 ng/mL (ref 0.0–4.7)

## 2015-11-04 ENCOUNTER — Ambulatory Visit (HOSPITAL_BASED_OUTPATIENT_CLINIC_OR_DEPARTMENT_OTHER): Payer: PRIVATE HEALTH INSURANCE | Admitting: Oncology

## 2015-11-04 ENCOUNTER — Telehealth: Payer: Self-pay | Admitting: Oncology

## 2015-11-04 VITALS — BP 128/86 | HR 97 | Temp 98.5°F | Resp 16 | Ht 65.0 in | Wt 239.9 lb

## 2015-11-04 DIAGNOSIS — R599 Enlarged lymph nodes, unspecified: Secondary | ICD-10-CM | POA: Diagnosis not present

## 2015-11-04 DIAGNOSIS — C184 Malignant neoplasm of transverse colon: Secondary | ICD-10-CM | POA: Diagnosis not present

## 2015-11-04 NOTE — Progress Notes (Signed)
[place] OFFICE PROGRESS NOTE   Diagnosis: Colon cancer  INTERVAL HISTORY:   Brenda Becker returns as scheduled. She feels well. She is working. The neuropathy symptoms have improved, especially in the left hand. She is scheduled to see Dr. Morton Stall within the next few weeks.  Objective:  Vital signs in last 24 hours:  Blood pressure 128/86, pulse 97, temperature 98.5 F (36.9 C), temperature source Oral, resp. rate 16, height 5\' 5"  (1.651 m), weight 239 lb 14.4 oz (108.8 kg), last menstrual period 10/05/2015, SpO2 100 %.    HEENT: Neck without mass Lymphatics: No cervical, supraclavicular, axillary, or inguinal nodes Resp: Lungs clear bilaterally Cardio: Regular rate and rhythm GI: No hepatomegaly, no mass, nontender Vascular: No leg edema    Portacath/PICC-without erythema  Lab Results:    Lab Results  Component Value Date   CEA1 0.9 11/01/2015    Imaging:  Ct Chest W Contrast  Result Date: 11/01/2015 CLINICAL DATA:  Followup stage III colon adenocarcinoma. Status post surgery and chemotherapy. EXAM: CT CHEST, ABDOMEN, AND PELVIS WITH CONTRAST TECHNIQUE: Multidetector CT imaging of the chest, abdomen and pelvis was performed following the standard protocol during bolus administration of intravenous contrast. CONTRAST:  13mL ISOVUE-300 IOPAMIDOL (ISOVUE-300) INJECTION 61% COMPARISON:  10/21/2014 FINDINGS: CT CHEST FINDINGS Cardiovascular: No acute findings. Right-sided Port-A-Cath in appropriate position. Mediastinum/Lymph Nodes: No masses or pathologically enlarged lymph nodes identified. Lungs/Pleura: No pulmonary infiltrate or mass identified. No effusion present. Small subpleural blebs again noted within the lingula. Musculoskeletal: No suspicious bone lesions or other significant abnormality. CT ABDOMEN AND PELVIS FINDINGS Hepatobiliary: No masses identified. Increased diffuse hepatic steatosis noted. Prior cholecystectomy noted. No evidence of biliary  dilatation. Pancreas:  No mass or inflammatory changes. Spleen:  Within normal limits in size and appearance. Adrenals/Urinary tract:  No masses or hydronephrosis. Stomach/Bowel: Interval surgical resection of previously seen mass in the transverse colon. Otherwise unremarkable. Vascular/Lymphatic: 11 mm partially calcified lymph node is seen within the upper abdominal mesenteric on image 55/2. This shows no significant change in size, but shows new partial calcification most consistent with treated lymph node metastasis. No other pathologically enlarged lymph nodes identified within the abdomen or pelvis. No abdominal aortic aneurysm. Aortic atherosclerosis. Reproductive: Fibroid is again seen within the uterine fundus measuring approximately 4 cm. Adnexal regions are unremarkable. Other:  None. Musculoskeletal:  No suspicious bone lesions identified. IMPRESSION: No definite evidence of recurrent or metastatic carcinoma. No significant change in 11 mm upper abdominal mesenteric lymph node which now shows partial calcification, consistent with treated lymph node metastasis. Mild increase in diffuse hepatic steatosis. 4 cm uterine fibroid Electronically Signed   By: Earle Gell M.D.   On: 11/01/2015 11:30   Ct Abdomen Pelvis W Contrast  Result Date: 11/01/2015 CLINICAL DATA:  Followup stage III colon adenocarcinoma. Status post surgery and chemotherapy. EXAM: CT CHEST, ABDOMEN, AND PELVIS WITH CONTRAST TECHNIQUE: Multidetector CT imaging of the chest, abdomen and pelvis was performed following the standard protocol during bolus administration of intravenous contrast. CONTRAST:  161mL ISOVUE-300 IOPAMIDOL (ISOVUE-300) INJECTION 61% COMPARISON:  10/21/2014 FINDINGS: CT CHEST FINDINGS Cardiovascular: No acute findings. Right-sided Port-A-Cath in appropriate position. Mediastinum/Lymph Nodes: No masses or pathologically enlarged lymph nodes identified. Lungs/Pleura: No pulmonary infiltrate or mass identified. No  effusion present. Small subpleural blebs again noted within the lingula. Musculoskeletal: No suspicious bone lesions or other significant abnormality. CT ABDOMEN AND PELVIS FINDINGS Hepatobiliary: No masses identified. Increased diffuse hepatic steatosis noted. Prior cholecystectomy noted. No evidence of  biliary dilatation. Pancreas:  No mass or inflammatory changes. Spleen:  Within normal limits in size and appearance. Adrenals/Urinary tract:  No masses or hydronephrosis. Stomach/Bowel: Interval surgical resection of previously seen mass in the transverse colon. Otherwise unremarkable. Vascular/Lymphatic: 11 mm partially calcified lymph node is seen within the upper abdominal mesenteric on image 55/2. This shows no significant change in size, but shows new partial calcification most consistent with treated lymph node metastasis. No other pathologically enlarged lymph nodes identified within the abdomen or pelvis. No abdominal aortic aneurysm. Aortic atherosclerosis. Reproductive: Fibroid is again seen within the uterine fundus measuring approximately 4 cm. Adnexal regions are unremarkable. Other:  None. Musculoskeletal:  No suspicious bone lesions identified. IMPRESSION: No definite evidence of recurrent or metastatic carcinoma. No significant change in 11 mm upper abdominal mesenteric lymph node which now shows partial calcification, consistent with treated lymph node metastasis. Mild increase in diffuse hepatic steatosis. 4 cm uterine fibroid Electronically Signed   By: Earle Gell M.D.   On: 11/01/2015 11:30    Medications: I have reviewed the patient's current medications.  Assessment/Plan: 1. 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  Cycle 2  CAPOX 01/22/2015  Cycle 3 CAPOX 02/12/2015  Cycle 4 CAPOX 03/05/2015  Cycle 5 CAPOX 03/26/2015  Cycle 6 CAPOX 04/16/2015  Cycle 7 CAPOX 05/07/2015 (oxaliplatin held due to progressive neuropathy)  Cycle 8 CAPOX 05/28/2015 (oxaliplatin held secondary to neuropathy)  CTs 11/01/2015-no significant change and an 11 mm upper abdomen mesenteric lymph node with partial calcification-treated metastasis?, No other evidence of metastatic disease 2. History of Microcytic anemia-likely iron deficiency anemia secondary to #1 and ongoing menses 3. Status post cholecystectomy 4. Family history of breast and ovarian cancer. She has seen the genetics counselor. 5. Delayed nausea following cycle 1 CAPOX. Aloxi and Emend added beginning with cycle 2. 6. Transient episodes of dyspnea following cycle 4 and cycle 5 CAPOX, likely neurotoxicity from oxaliplatin 7. Oxaliplatin neuropathy-improved   Disposition:  Ms. Whisonant remains in clinical remission from colon cancer. I reviewed the CT images with her today. The significance of the persistent mesenteric lymph node is unclear. This could represent a benign lymph node or treated metastasis. I will present her case at the GI tumor conference.  She will keep the Port-A-Cath in place for now. She will return for a Port-A-Cath flush in 6 weeks and an office visit in 3 months. We will plan for a restaging CT in approximately 6 months.  Betsy Coder, MD  11/04/2015  12:33 PM

## 2015-11-04 NOTE — Telephone Encounter (Signed)
Appointments scheduled per 11/2 LOS. Patient given AVS report and calendars with future scheduled appointments °

## 2015-11-10 ENCOUNTER — Telehealth: Payer: Self-pay | Admitting: *Deleted

## 2015-11-10 ENCOUNTER — Encounter: Payer: Self-pay | Admitting: Genetic Counselor

## 2015-11-10 NOTE — Telephone Encounter (Signed)
Case discussion in tumor board today. Recommendations are to repeat CEA and CT scan 3 months from last scan. Per Dr. [name], have labs/scan few days prior to her Jan office visit. Left VM for patient to call navigator to discuss plans.

## 2015-11-11 ENCOUNTER — Telehealth: Payer: Self-pay | Admitting: *Deleted

## 2015-11-11 DIAGNOSIS — C184 Malignant neoplasm of transverse colon: Secondary | ICD-10-CM

## 2015-11-11 NOTE — Telephone Encounter (Signed)
Oncology Nurse Navigator Documentation  Oncology Nurse Navigator Flowsheets 11/11/2015  Navigator Location [facility]  Referral date to RadOnc/MedOnc -  Navigator Encounter Type Telephone  Telephone Patient Update--made her aware of tumor board discussion and recommendations. She understands and agrees. Wants appointment on a Friday late am/early afternoon.  Treatment Initiated Date -  Patient Visit Type -  Treatment Phase Survivorship  Barriers/Navigation Needs Coordination of Care  Education -  Interventions Coordination of Care--LOS to scheduler for lab/flush on 01/21/16   Referrals -  Coordination of Care Radiology-ordered scan per Dr. [name]   Education Method -  Support Groups/Services -  Acuity Level 1  Time Spent with Patient 15

## 2015-12-16 ENCOUNTER — Ambulatory Visit (HOSPITAL_BASED_OUTPATIENT_CLINIC_OR_DEPARTMENT_OTHER): Payer: PRIVATE HEALTH INSURANCE

## 2015-12-16 DIAGNOSIS — Z95828 Presence of other vascular implants and grafts: Secondary | ICD-10-CM

## 2015-12-16 DIAGNOSIS — C184 Malignant neoplasm of transverse colon: Secondary | ICD-10-CM

## 2015-12-16 DIAGNOSIS — Z452 Encounter for adjustment and management of vascular access device: Secondary | ICD-10-CM

## 2015-12-16 MED ORDER — SODIUM CHLORIDE 0.9 % IJ SOLN
10.0000 mL | INTRAMUSCULAR | Status: DC | PRN
  Administered 2015-12-16: 10 mL via INTRAVENOUS
  Filled 2015-12-16: qty 10

## 2015-12-16 MED ORDER — HEPARIN SOD (PORK) LOCK FLUSH 100 UNIT/ML IV SOLN
500.0000 [IU] | Freq: Once | INTRAVENOUS | Status: AC | PRN
  Administered 2015-12-16: 500 [IU] via INTRAVENOUS
  Filled 2015-12-16: qty 5

## 2016-01-21 ENCOUNTER — Encounter (HOSPITAL_COMMUNITY): Payer: Self-pay

## 2016-01-21 ENCOUNTER — Ambulatory Visit (HOSPITAL_COMMUNITY)
Admission: RE | Admit: 2016-01-21 | Discharge: 2016-01-21 | Disposition: A | Discharge status: Home / Self Care | Payer: PRIVATE HEALTH INSURANCE | Source: Ambulatory Visit | Attending: Oncology | Admitting: Oncology

## 2016-01-21 ENCOUNTER — Ambulatory Visit (HOSPITAL_BASED_OUTPATIENT_CLINIC_OR_DEPARTMENT_OTHER): Payer: PRIVATE HEALTH INSURANCE

## 2016-01-21 DIAGNOSIS — Z95828 Presence of other vascular implants and grafts: Secondary | ICD-10-CM

## 2016-01-21 DIAGNOSIS — Z9889 Other specified postprocedural states: Secondary | ICD-10-CM | POA: Diagnosis not present

## 2016-01-21 DIAGNOSIS — K760 Fatty (change of) liver, not elsewhere classified: Secondary | ICD-10-CM | POA: Diagnosis not present

## 2016-01-21 DIAGNOSIS — C184 Malignant neoplasm of transverse colon: Secondary | ICD-10-CM

## 2016-01-21 DIAGNOSIS — D259 Leiomyoma of uterus, unspecified: Secondary | ICD-10-CM | POA: Diagnosis not present

## 2016-01-21 DIAGNOSIS — C779 Secondary and unspecified malignant neoplasm of lymph node, unspecified: Secondary | ICD-10-CM | POA: Diagnosis not present

## 2016-01-21 DIAGNOSIS — Z452 Encounter for adjustment and management of vascular access device: Secondary | ICD-10-CM

## 2016-01-21 MED ORDER — IOPAMIDOL (ISOVUE-300) INJECTION 61%
100.0000 mL | Freq: Once | INTRAVENOUS | Status: AC | PRN
  Administered 2016-01-21: 100 mL via INTRAVENOUS

## 2016-01-21 MED ORDER — SODIUM CHLORIDE 0.9 % IJ SOLN
10.0000 mL | INTRAMUSCULAR | Status: DC | PRN
  Administered 2016-01-21: 10 mL via INTRAVENOUS
  Filled 2016-01-21: qty 10

## 2016-01-27 ENCOUNTER — Ambulatory Visit (HOSPITAL_BASED_OUTPATIENT_CLINIC_OR_DEPARTMENT_OTHER): Payer: PRIVATE HEALTH INSURANCE | Admitting: Nurse Practitioner

## 2016-01-27 ENCOUNTER — Encounter: Payer: Self-pay | Admitting: *Deleted

## 2016-01-27 ENCOUNTER — Telehealth: Payer: Self-pay | Admitting: Nurse Practitioner

## 2016-01-27 ENCOUNTER — Ambulatory Visit (HOSPITAL_BASED_OUTPATIENT_CLINIC_OR_DEPARTMENT_OTHER): Payer: PRIVATE HEALTH INSURANCE

## 2016-01-27 ENCOUNTER — Telehealth: Payer: Self-pay

## 2016-01-27 VITALS — BP 149/96 | HR 86 | Temp 98.8°F | Resp 18 | Ht 65.0 in | Wt 227.7 lb

## 2016-01-27 DIAGNOSIS — C184 Malignant neoplasm of transverse colon: Secondary | ICD-10-CM

## 2016-01-27 DIAGNOSIS — R599 Enlarged lymph nodes, unspecified: Secondary | ICD-10-CM

## 2016-01-27 DIAGNOSIS — C779 Secondary and unspecified malignant neoplasm of lymph node, unspecified: Secondary | ICD-10-CM

## 2016-01-27 LAB — COMPREHENSIVE METABOLIC PANEL
ALT: 56 U/L — AB (ref 0–55)
ANION GAP: 9 meq/L (ref 3–11)
AST: 40 U/L — AB (ref 5–34)
Albumin: 4.4 g/dL (ref 3.5–5.0)
Alkaline Phosphatase: 88 U/L (ref 40–150)
BILIRUBIN TOTAL: 0.53 mg/dL (ref 0.20–1.20)
BUN: 8.6 mg/dL (ref 7.0–26.0)
CHLORIDE: 107 meq/L (ref 98–109)
CO2: 25 meq/L (ref 22–29)
CREATININE: 0.9 mg/dL (ref 0.6–1.1)
Calcium: 10 mg/dL (ref 8.4–10.4)
EGFR: 82 mL/min/{1.73_m2} — ABNORMAL LOW (ref >90)
Glucose: 94 mg/dl (ref 70–140)
Potassium: 3.8 mEq/L (ref 3.5–5.1)
Sodium: 141 mEq/L (ref 136–145)
TOTAL PROTEIN: 8.1 g/dL (ref 6.4–8.3)

## 2016-01-27 LAB — CEA (IN HOUSE-CHCC): CEA (CHCC-In House): <1 ng/mL (ref 0.00–5.00)

## 2016-01-27 NOTE — Telephone Encounter (Signed)
Appointments scheduled per 1/25 LOS. Patient given AVS report and calendars with future scheduled appointments. During scheduling the patient received a call from Dr. Eugenia Pancoast office to schedule an appointment.

## 2016-01-27 NOTE — Telephone Encounter (Signed)
-----   Message from Milus Banister, MD sent at 01/27/2016  2:19 PM EST ----- Regarding: RE: EUS biopsy Kirtland Bouchard get her on for EUS. Thanks  Brenda Becker, She needs upper EUS, radial +/- linear for abnormal CT scan abdomen.  Next available MAC Thursday.  DJ    ----- Message ----- From: Barron Alvine, RN Sent: 01/27/2016  12:31 PM To: Milus Banister, MD Subject: [name]: EUS biopsy                                   ----- Message ----- From: Tania Ade, RN Sent: 01/27/2016  11:45 AM To: Milus Banister, MD, Barron Alvine, RN, # Subject: EUS biopsy                                     This case was discussed in tumor board on 01/26/16 and Dr. [name], you felt you could biopsy this upper central abdomen node by EUS. Dr. [name] saw her today and wants to proceed with EUS biopsy as soon as you are available. Thanks, [name]

## 2016-01-27 NOTE — Progress Notes (Addendum)
[place] OFFICE PROGRESS NOTE   Diagnosis:  Colon cancer  INTERVAL HISTORY:   [name] returns as scheduled. She feels well. She has recently lost weight. She attributes this to a change in her diet and joining a gym. No abdominal pain. No nausea or vomiting. Bowels moving regularly.  Objective:  Vital signs in last 24 hours:  Blood pressure (!) 149/96, pulse 86, temperature 98.8 F (37.1 C), temperature source Oral, resp. rate 18, height 5\' 5"  (1.651 m), weight 227 lb 11.2 oz (103.3 kg), last menstrual period 12/29/2015, SpO2 99 %.    HEENT: No neck mass. Lymphatics: No palpable cervical, supraclavicular, axillary or inguinal lymph nodes. Resp: Lungs clear bilaterally. Cardio: Regular rate and rhythm. GI: Abdomen soft and nontender. No hepatomegaly. No mass. Vascular: No leg edema. Port a cath without erythema.  Lab Results:  Lab Results  Component Value Date   WBC 5.5 05/28/2015   HGB 13.3 05/28/2015   HCT 38.9 05/28/2015   MCV 95.3 05/28/2015   PLT 165 05/28/2015   NEUTROABS 2.6 05/28/2015    Imaging:  No results found.  Medications: I have reviewed the patient's current medications.  Assessment/Plan: 1. 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  Cycle 2 CAPOX 01/22/2015  Cycle 3 CAPOX 02/12/2015  Cycle 4 CAPOX 03/05/2015  Cycle 5 CAPOX 03/26/2015  Cycle 6 CAPOX 04/16/2015  Cycle 7 CAPOX 05/07/2015 (oxaliplatin held due to progressive neuropathy)  Cycle 8 CAPOX 05/28/2015 (oxaliplatin held secondary to neuropathy)  CTs 11/01/2015-no significant change and an 11 mm upper abdomen mesenteric lymph node with partial calcification-treated metastasis?, No other evidence of  metastatic disease  CTs 01/21/2016-mild interval progression of previously identified calcified nodal tissue in the upper central abdomen. Development of small adjacent lymph nodes in the interval. 2. History of Microcytic anemia-likely iron deficiency anemia secondary to #1 and ongoing menses 3. Status post cholecystectomy 4. Family history of breast and ovarian cancer. She has seen the genetics counselor. 5. Delayed nausea following cycle 1 CAPOX. Aloxi and Emend added beginning with cycle 2. 6. Transient episodes of dyspnea following cycle 4 and cycle 5 CAPOX, likely neurotoxicity from oxaliplatin 7. Oxaliplatin neuropathy-improved   Disposition: Brenda Becker appears stable. The recent follow-up CT scans showed mild increase in the size of an upper mesenteric abdominal lymph node and development of some small adjacent lymph nodes. Her case was presented at the recent GI tumor conference. The recommendation is to proceed with a biopsy of the mesenteric adenopathy. We made a referral to Dr. [name]. If the biopsy confirms colon cancer we will make a referral to Dr. Morton Stall to consider resection.  She will return for a follow-up visit here in approximately 3 weeks.  Patient seen with Dr. [name]. CT images reviewed on the computer with Brenda Becker and her mother. 25 minutes were spent face-to-face at today's visit with the majority of that time involved in counseling/coordination of care.    [name]   01/27/2016  11:12 AM  This was a shared visit with Ned Card. We reviewed the CT images with Brenda Becker. I presented her case at the GI tumor conference yesterday. The plan is to refer her to Dr. [name] for an EUS guided biopsy of the mesenteric lymph node. I referred her to Dr. Morton Stall  to see if the node(s)is potentially resectable.  She will return for an office visit and further discussion in 3 weeks.  Julieanne Manson, M.D.

## 2016-01-27 NOTE — Progress Notes (Signed)
Oncology Nurse Navigator Documentation  Oncology Nurse Navigator Flowsheets 01/27/2016  Navigator Location [facility]  Referral date to RadOnc/MedOnc -  Navigator Encounter Type Letter/Fax/Email  Telephone -  Treatment Initiated Date -  Patient Visit Type -  Treatment Phase -  Barriers/Navigation Needs Coordination of Care---EUS biopsy  Education -  Interventions Coordination of Care--in basket message to Dr. [name] and his nurse for EUS bx as soon as able to schedule. Case was discussed at tumor board on 1/24.  Referrals -  Coordination of Care -  Education Method -  Support Groups/Services -  Acuity -  Time Spent with Patient 15

## 2016-01-28 ENCOUNTER — Other Ambulatory Visit: Payer: Self-pay

## 2016-01-28 DIAGNOSIS — R9389 Abnormal findings on diagnostic imaging of other specified body structures: Secondary | ICD-10-CM

## 2016-01-28 LAB — CEA: CEA: 1.3 ng/mL (ref 0.0–4.7)

## 2016-01-28 NOTE — Telephone Encounter (Signed)
EUS scheduled, pt instructed and medications reviewed.  Patient instructions mailed to home.  Patient to call with any questions or concerns.  

## 2016-01-31 ENCOUNTER — Telehealth: Payer: Self-pay | Admitting: *Deleted

## 2016-01-31 NOTE — Telephone Encounter (Signed)
Oncology Nurse Navigator Documentation  Oncology Nurse Navigator Flowsheets 01/31/2016  Navigator Location [facility]  Referral date to RadOnc/MedOnc -  Navigator Encounter Type Telephone  Telephone Appt Confirmation/Clarification  Treatment Initiated Date -  Patient Visit Type -  Treatment Phase -  Barriers/Navigation Needs Coordination of Care--EUS on 2/15--reschedule office visit   Education -  Interventions Coordination of Care--rescheduled OV and port flush and patient aware and agrees.  Referrals -  Coordination of Care Appts--OV 02/22/16 with flush (0930)  Education Method -  Support Groups/Services -  Acuity -  Time Spent with Patient 15

## 2016-02-16 ENCOUNTER — Encounter (HOSPITAL_COMMUNITY): Payer: Self-pay | Admitting: *Deleted

## 2016-02-17 ENCOUNTER — Encounter (HOSPITAL_COMMUNITY)
Admission: RE | Disposition: A | Discharge status: Home / Self Care | Payer: Self-pay | Source: Ambulatory Visit | Attending: Gastroenterology

## 2016-02-17 ENCOUNTER — Ambulatory Visit (HOSPITAL_COMMUNITY): Payer: PRIVATE HEALTH INSURANCE | Admitting: Anesthesiology

## 2016-02-17 ENCOUNTER — Ambulatory Visit: Payer: PRIVATE HEALTH INSURANCE | Admitting: Oncology

## 2016-02-17 ENCOUNTER — Ambulatory Visit (HOSPITAL_COMMUNITY)
Admission: RE | Admit: 2016-02-17 | Discharge: 2016-02-17 | Disposition: A | Discharge status: Home / Self Care | Payer: PRIVATE HEALTH INSURANCE | Source: Ambulatory Visit | Attending: Gastroenterology | Admitting: Gastroenterology

## 2016-02-17 ENCOUNTER — Encounter (HOSPITAL_COMMUNITY): Payer: Self-pay

## 2016-02-17 DIAGNOSIS — Z9049 Acquired absence of other specified parts of digestive tract: Secondary | ICD-10-CM | POA: Diagnosis not present

## 2016-02-17 DIAGNOSIS — G620 Drug-induced polyneuropathy: Secondary | ICD-10-CM | POA: Diagnosis not present

## 2016-02-17 DIAGNOSIS — Z8041 Family history of malignant neoplasm of ovary: Secondary | ICD-10-CM | POA: Diagnosis not present

## 2016-02-17 DIAGNOSIS — C494 Malignant neoplasm of connective and soft tissue of abdomen: Secondary | ICD-10-CM | POA: Insufficient documentation

## 2016-02-17 DIAGNOSIS — C779 Secondary and unspecified malignant neoplasm of lymph node, unspecified: Secondary | ICD-10-CM | POA: Insufficient documentation

## 2016-02-17 DIAGNOSIS — T451X5A Adverse effect of antineoplastic and immunosuppressive drugs, initial encounter: Secondary | ICD-10-CM | POA: Insufficient documentation

## 2016-02-17 DIAGNOSIS — I899 Noninfective disorder of lymphatic vessels and lymph nodes, unspecified: Secondary | ICD-10-CM | POA: Diagnosis not present

## 2016-02-17 DIAGNOSIS — Z803 Family history of malignant neoplasm of breast: Secondary | ICD-10-CM | POA: Diagnosis not present

## 2016-02-17 DIAGNOSIS — Z85038 Personal history of other malignant neoplasm of large intestine: Secondary | ICD-10-CM | POA: Insufficient documentation

## 2016-02-17 DIAGNOSIS — R9389 Abnormal findings on diagnostic imaging of other specified body structures: Secondary | ICD-10-CM

## 2016-02-17 DIAGNOSIS — R938 Abnormal findings on diagnostic imaging of other specified body structures: Secondary | ICD-10-CM | POA: Diagnosis not present

## 2016-02-17 DIAGNOSIS — R1909 Other intra-abdominal and pelvic swelling, mass and lump: Secondary | ICD-10-CM | POA: Diagnosis present

## 2016-02-17 HISTORY — PX: EUS: SHX5427

## 2016-02-17 SURGERY — UPPER ENDOSCOPIC ULTRASOUND (EUS) RADIAL
Anesthesia: Monitor Anesthesia Care

## 2016-02-17 MED ORDER — SODIUM CHLORIDE 0.9 % IV SOLN: INTRAVENOUS | Status: DC

## 2016-02-17 MED ORDER — PROPOFOL 10 MG/ML IV BOLUS
INTRAVENOUS | Status: AC
  Filled 2016-02-17: qty 40

## 2016-02-17 MED ORDER — MIDAZOLAM HCL 5 MG/5ML IJ SOLN
INTRAMUSCULAR | Status: DC | PRN
  Administered 2016-02-17: 2 mg via INTRAVENOUS

## 2016-02-17 MED ORDER — PROPOFOL 500 MG/50ML IV EMUL
INTRAVENOUS | Status: DC | PRN
  Administered 2016-02-17 (×4): 50 mg via INTRAVENOUS

## 2016-02-17 MED ORDER — PROPOFOL 500 MG/50ML IV EMUL
INTRAVENOUS | Status: DC | PRN
  Administered 2016-02-17: 125 ug/kg/min via INTRAVENOUS

## 2016-02-17 MED ORDER — PROPOFOL 10 MG/ML IV BOLUS
INTRAVENOUS | Status: AC
  Filled 2016-02-17: qty 20

## 2016-02-17 MED ORDER — MIDAZOLAM HCL 2 MG/2ML IJ SOLN
INTRAMUSCULAR | Status: AC
  Filled 2016-02-17: qty 2

## 2016-02-17 MED ORDER — ONDANSETRON HCL 4 MG/2ML IJ SOLN
INTRAMUSCULAR | Status: DC | PRN
  Administered 2016-02-17: 4 mg via INTRAVENOUS

## 2016-02-17 MED ORDER — ONDANSETRON HCL 4 MG/2ML IJ SOLN
INTRAMUSCULAR | Status: AC
  Filled 2016-02-17: qty 2

## 2016-02-17 MED ORDER — LACTATED RINGERS IV SOLN
INTRAVENOUS | Status: DC | PRN
  Administered 2016-02-17: 12:00:00 via INTRAVENOUS

## 2016-02-17 NOTE — Transfer of Care (Signed)
Immediate Anesthesia Transfer of Care Note  Patient: Brenda Becker  Procedure(s) Performed: Procedure(s): UPPER ENDOSCOPIC ULTRASOUND (EUS) RADIAL (N/A)  Patient Location: PACU  Anesthesia Type:MAC  Level of Consciousness:  sedated, patient cooperative and responds to stimulation  Airway & Oxygen Therapy:Patient Spontanous Breathing and Patient connected to face mask oxgen  Post-op Assessment:  Report given to PACU RN and Post -op Vital signs reviewed and stable  Post vital signs:  Reviewed and stable  Last Vitals:  Vitals:   02/17/16 1147  BP: (!) 192/96  Pulse: 81  Resp: 18  Temp: 43.3 C    Complications: No apparent anesthesia complications  Anesthesia Transfer of Care Note  Patient: Brenda Becker  Procedure(s) Performed: Procedure(s): UPPER ENDOSCOPIC ULTRASOUND (EUS) RADIAL (N/A)  Patient Location: Endoscopy Unit  Anesthesia Type:MAC  Level of Consciousness: awake and alert   Airway & Oxygen Therapy: Patient Spontanous Breathing  Post-op Assessment: Report given to RN  Post vital signs: Reviewed and stable  Last Vitals:  Vitals:   02/17/16 1147  BP: (!) 192/96  Pulse: 81  Resp: 18  Temp: 36.7 C    Last Pain:  Vitals:   02/17/16 1147  TempSrc: Oral         Complications: No apparent anesthesia complications

## 2016-02-17 NOTE — H&P (View-Only) (Signed)
[place] OFFICE PROGRESS NOTE   Diagnosis:  Colon cancer  INTERVAL HISTORY:   [name] returns as scheduled. She feels well. She has recently lost weight. She attributes this to a change in her diet and joining a gym. No abdominal pain. No nausea or vomiting. Bowels moving regularly.  Objective:  Vital signs in last 24 hours:  Blood pressure (!) 149/96, pulse 86, temperature 98.8 F (37.1 C), temperature source Oral, resp. rate 18, height 5\' 5"  (1.651 m), weight 227 lb 11.2 oz (103.3 kg), last menstrual period 12/29/2015, SpO2 99 %.    HEENT: No neck mass. Lymphatics: No palpable cervical, supraclavicular, axillary or inguinal lymph nodes. Resp: Lungs clear bilaterally. Cardio: Regular rate and rhythm. GI: Abdomen soft and nontender. No hepatomegaly. No mass. Vascular: No leg edema. Port a cath without erythema.  Lab Results:  Lab Results  Component Value Date   WBC 5.5 05/28/2015   HGB 13.3 05/28/2015   HCT 38.9 05/28/2015   MCV 95.3 05/28/2015   PLT 165 05/28/2015   NEUTROABS 2.6 05/28/2015    Imaging:  No results found.  Medications: I have reviewed the patient's current medications.  Assessment/Plan: 1. 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  Cycle 2 CAPOX 01/22/2015  Cycle 3 CAPOX 02/12/2015  Cycle 4 CAPOX 03/05/2015  Cycle 5 CAPOX 03/26/2015  Cycle 6 CAPOX 04/16/2015  Cycle 7 CAPOX 05/07/2015 (oxaliplatin held due to progressive neuropathy)  Cycle 8 CAPOX 05/28/2015 (oxaliplatin held secondary to neuropathy)  CTs 11/01/2015-no significant change and an 11 mm upper abdomen mesenteric lymph node with partial calcification-treated metastasis?, No other evidence of  metastatic disease  CTs 01/21/2016-mild interval progression of previously identified calcified nodal tissue in the upper central abdomen. Development of small adjacent lymph nodes in the interval. 2. History of Microcytic anemia-likely iron deficiency anemia secondary to #1 and ongoing menses 3. Status post cholecystectomy 4. Family history of breast and ovarian cancer. She has seen the genetics counselor. 5. Delayed nausea following cycle 1 CAPOX. Aloxi and Emend added beginning with cycle 2. 6. Transient episodes of dyspnea following cycle 4 and cycle 5 CAPOX, likely neurotoxicity from oxaliplatin 7. Oxaliplatin neuropathy-improved   Disposition: [name] appears stable. The recent follow-up CT scans showed mild increase in the size of an upper mesenteric abdominal lymph node and development of some small adjacent lymph nodes. Her case was presented at the recent GI tumor conference. The recommendation is to proceed with a biopsy of the mesenteric adenopathy. We made a referral to Dr. [name]. If the biopsy confirms colon cancer we will make a referral to Dr. Morton Stall to consider resection.  She will return for a follow-up visit here in approximately 3 weeks.  Patient seen with Dr. [name]. CT images reviewed on the computer with Ms. Brettschneider and her mother. 25 minutes were spent face-to-face at today's visit with the majority of that time involved in counseling/coordination of care.    [name]   01/27/2016  11:12 AM  This was a shared visit with Ned Card. We reviewed the CT images with Ms. Carlise. I presented her case at the GI tumor conference yesterday. The plan is to refer her to Dr. [name] for an EUS guided biopsy of the mesenteric lymph node. I referred her to Dr. Morton Stall  to see if the node(s)is potentially resectable.  She will return for an office visit and further discussion in 3 weeks.  Julieanne Manson, M.D.

## 2016-02-17 NOTE — Anesthesia Postprocedure Evaluation (Addendum)
Anesthesia Post Note  Patient: Brenda Becker  Procedure(s) Performed: Procedure(s) (LRB): UPPER ENDOSCOPIC ULTRASOUND (EUS) RADIAL (N/A)  Patient location during evaluation: Endoscopy Anesthesia Type: MAC Level of consciousness: awake and alert Pain management: pain level controlled Vital Signs Assessment: post-procedure vital signs reviewed and stable Respiratory status: spontaneous breathing, nonlabored ventilation, respiratory function stable and patient connected to nasal cannula oxygen Cardiovascular status: stable and blood pressure returned to baseline Anesthetic complications: no       Last Vitals:  Vitals:   02/17/16 1147 02/17/16 1246  BP: (!) 192/96 (!) 145/80  Pulse: 81 81  Resp: 18 20  Temp: 36.7 C 36.6 C    Last Pain:  Vitals:   02/17/16 1246  TempSrc: Oral                 Malaya Cagley,JAMES TERRILL

## 2016-02-17 NOTE — Anesthesia Preprocedure Evaluation (Signed)
Anesthesia Evaluation  Patient identified by MRN, date of birth, ID band Patient awake    Reviewed: Allergy & Precautions, NPO status , Patient's Chart, lab work & pertinent test results  History of Anesthesia Complications (+) PONV  Airway Mallampati: II  TM Distance: >3 FB Neck ROM: Full    Dental  (+) Teeth Intact, Dental Advisory Given   Pulmonary neg pulmonary ROS,    breath sounds clear to auscultation       Cardiovascular  Rhythm:Regular Rate:Normal     Neuro/Psych    GI/Hepatic Neg liver ROS, Hx of colon ca   Endo/Other  negative endocrine ROS  Renal/GU negative Renal ROS     Musculoskeletal negative musculoskeletal ROS (+)   Abdominal   Peds  Hematology   Anesthesia Other Findings   Reproductive/Obstetrics                             Anesthesia Physical Anesthesia Plan  ASA: II  Anesthesia Plan: MAC   Post-op Pain Management:    Induction: Intravenous  Airway Management Planned: Natural Airway and Simple Face Mask  Additional Equipment:   Intra-op Plan:   Post-operative Plan:   Informed Consent: I have reviewed the patients History and Physical, chart, labs and discussed the procedure including the risks, benefits and alternatives for the proposed anesthesia with the patient or authorized representative who has indicated his/her understanding and acceptance.     Plan Discussed with:   Anesthesia Plan Comments:         Anesthesia Quick Evaluation

## 2016-02-17 NOTE — Discharge Instructions (Signed)
YOU HAD AN ENDOSCOPIC PROCEDURE TODAY: Refer to the procedure report and other information in the discharge instructions given to you for any specific questions about what was found during the examination. If this information does not answer your questions, please call [place] office at [phone] to clarify.  ° °YOU SHOULD EXPECT: Some feelings of bloating in the abdomen. Passage of more gas than usual. Walking can help get rid of the air that was put into your GI tract during the procedure and reduce the bloating. If you had a lower endoscopy (such as a colonoscopy or flexible sigmoidoscopy) you may notice spotting of blood in your stool or on the toilet paper. Some abdominal soreness may be present for a day or two, also. ° °DIET: Your first meal following the procedure should be a light meal and then it is ok to progress to your normal diet. A half-sandwich or bowl of soup is an example of a good first meal. Heavy or fried foods are harder to digest and may make you feel nauseous or bloated. Drink plenty of fluids but you should avoid alcoholic beverages for 24 hours. If you had a esophageal dilation, please see attached instructions for diet.   ° °ACTIVITY: Your care partner should take you home directly after the procedure. You should plan to take it easy, moving slowly for the rest of the day. You can resume normal activity the day after the procedure however YOU SHOULD NOT DRIVE, use power tools, machinery or perform tasks that involve climbing or major physical exertion for 24 hours (because of the sedation medicines used during the test).  ° °SYMPTOMS TO REPORT IMMEDIATELY: °A gastroenterologist can be reached at any hour. Please call [phone]  for any of the following symptoms:  °Following lower endoscopy (colonoscopy, flexible sigmoidoscopy) °Excessive amounts of blood in the stool  °Significant tenderness, worsening of abdominal pains  °Swelling of the abdomen that is new, acute  °Fever of 100° or  higher  °Following upper endoscopy (EGD, EUS, ERCP, esophageal dilation) °Vomiting of blood or coffee ground material  °New, significant abdominal pain  °New, significant chest pain or pain under the shoulder blades  °Painful or persistently difficult swallowing  °New shortness of breath  °Black, tarry-looking or red, bloody stools ° °FOLLOW UP:  °If any biopsies were taken you will be contacted by phone or by letter within the next 1-3 weeks. Call [phone]  if you have not heard about the biopsies in 3 weeks.  °Please also call with any specific questions about appointments or follow up tests. ° °

## 2016-02-17 NOTE — Interval H&P Note (Signed)
History and Physical Interval Note:  02/17/2016 11:11 AM  Brenda Becker  has presented today for surgery, with the diagnosis of abnormal CT  The various methods of treatment have been discussed with the patient and family. After consideration of risks, benefits and other options for treatment, the patient has consented to  Procedure(s): UPPER ENDOSCOPIC ULTRASOUND (EUS) RADIAL (N/A) as a surgical intervention .  The patient's history has been reviewed, patient examined, no change in status, stable for surgery.  I have reviewed the patient's chart and labs.  Questions were answered to the patient's satisfaction.     Milus Banister

## 2016-02-17 NOTE — Op Note (Signed)
[facility] Patient Name: Brenda Becker Procedure Date: 02/17/2016 MRN: [id number] Attending MD: Milus Banister , MD Date of Birth: 02-Feb-1970 CSN: [id number] Age: 46 Admit Type: Outpatient Procedure:                Upper EUS Indications:              Abnormal mass in "upper central abdomen",                            increasing in size on interval CT: 1. Stage IIIc                            (T3N2b) well-differentiated adenocarcinoma the                            transverse colon, status post a partial colectomy                            11/16/2014; 10/17 lymph node positive for                            metastatic adenocarcinoma, positive tumor deposits Providers:                Milus Banister, MD, Cleda Daub, RN, Alfonso Patten, Technician, Arnoldo Hooker, CRNA Referring MD:             Julieanne Manson, MD Medicines:                Monitored Anesthesia Care Complications:            No immediate complications. Estimated blood loss:                            None. Estimated Blood Loss:     Estimated blood loss: none. Procedure:                Pre-Anesthesia Assessment:                           - Prior to the procedure, a History and Physical                            was performed, and patient medications and                            allergies were reviewed. The patient's tolerance of                            previous anesthesia was also reviewed. The risks                            and benefits of the procedure and the sedation  options and risks were discussed with the patient.                            All questions were answered, and informed consent                            was obtained. Prior Anticoagulants: The patient has                            taken no previous anticoagulant or antiplatelet                            agents. ASA Grade Assessment: II - A patient with   mild systemic disease. After reviewing the risks                            and benefits, the patient was deemed in                            satisfactory condition to undergo the procedure.                           After obtaining informed consent, the endoscope was                            passed under direct vision. Throughout the                            procedure, the patient's blood pressure, pulse, and                            oxygen saturations were monitored continuously. The                            [id number]) scope was introduced through                            the mouth, and advanced to the antrum of the                            stomach. The upper EUS was accomplished without                            difficulty. The patient tolerated the procedure                            well. Findings:      Endoscopic Finding :      1. Normal UGI tract      Endosonographic Finding :      1. The peripancreatic, "upper central abdomen" soft tissue mass       described on recent CT was located. This measured 2cm across, one region       contained shadowing calcifications. The hypoechoic lesion was sampled       with fine needle aspiration.  Color Doppler imaging was utilized prior to       needle puncture to confirm a lack of significant vascular structures       within the needle path. Two passes were made with the 25 gauge needle       using a transgastric approach. A cytotechnologist was present to       evaluate the adequacy of the specimen. Final cytology results are       pending.      2. Limited EUS examination was otherwie normal. Impression:               - "Upper central abdomen" soft tissue mass that is                            described on recent CT scan was located and sampled                            with transgastric EUS FNA.                           - Preliminary cytology review is positive for                            maligancy  (adenocarcinoma). Moderate Sedation:      N/A- Per Anesthesia Care Recommendation:           - Discharge patient to home (ambulatory).                           - Await final cytology results. Procedure Code(s):        --- Professional ---                           [id number], 5, Esophagogastroduodenoscopy, flexible,                            transoral; with transendoscopic ultrasound-guided                            intramural or transmural fine needle                            aspiration/biopsy(s), (includes endoscopic                            ultrasound examination limited to the esophagus,                            stomach or duodenum, and adjacent structures) Diagnosis Code(s):        --- Professional ---                           I89.9, Noninfective disorder of lymphatic vessels                            and lymph nodes, unspecified  R93.5, Abnormal findings on diagnostic imaging of                            other abdominal regions, including retroperitoneum CPT copyright 2016 American [employer] Association. All rights reserved. The codes documented in this report are preliminary and upon coder review may  be revised to meet current compliance requirements. Milus Banister, MD 02/17/2016 12:39:50 PM This report has been signed electronically. Number of Addenda: 0

## 2016-02-18 ENCOUNTER — Encounter (HOSPITAL_COMMUNITY): Payer: Self-pay | Admitting: Gastroenterology

## 2016-02-22 ENCOUNTER — Telehealth: Payer: Self-pay | Admitting: Oncology

## 2016-02-22 ENCOUNTER — Ambulatory Visit (HOSPITAL_BASED_OUTPATIENT_CLINIC_OR_DEPARTMENT_OTHER): Payer: PRIVATE HEALTH INSURANCE | Admitting: Oncology

## 2016-02-22 ENCOUNTER — Ambulatory Visit: Payer: PRIVATE HEALTH INSURANCE

## 2016-02-22 VITALS — BP 127/90 | HR 98 | Temp 98.4°F | Resp 18 | Ht 65.0 in | Wt 222.0 lb

## 2016-02-22 DIAGNOSIS — C184 Malignant neoplasm of transverse colon: Secondary | ICD-10-CM

## 2016-02-22 DIAGNOSIS — C7989 Secondary malignant neoplasm of other specified sites: Secondary | ICD-10-CM

## 2016-02-22 DIAGNOSIS — Z95828 Presence of other vascular implants and grafts: Secondary | ICD-10-CM

## 2016-02-22 MED ORDER — HEPARIN SOD (PORK) LOCK FLUSH 100 UNIT/ML IV SOLN
500.0000 [IU] | Freq: Once | INTRAVENOUS | Status: AC | PRN
  Administered 2016-02-22: 500 [IU] via INTRAVENOUS
  Filled 2016-02-22: qty 5

## 2016-02-22 MED ORDER — SODIUM CHLORIDE 0.9 % IJ SOLN
10.0000 mL | INTRAMUSCULAR | Status: DC | PRN
  Administered 2016-02-22: 10 mL via INTRAVENOUS
  Filled 2016-02-22: qty 10

## 2016-02-22 NOTE — Progress Notes (Signed)
  [place] OFFICE PROGRESS NOTE   Diagnosis: Colon cancer  INTERVAL HISTORY:   Ms. Braman returns as scheduled. She feels well. No complaint. She underwent an EUS guided biopsy by Dr. [name] on 02/17/2016. The upper GI tract appeared normal. The 2 cm mass in the upper abdomen was located and sampled with a fine-needle aspiration with a transgastric approach. The cytology confirmed malignant cells consistent with metastatic adenocarcinoma.  She saw Dr. Morton Stall prior to the biopsy procedure to discuss the possibility of surgery.  She reports intentional weight loss.  Objective:  Vital signs in last 24 hours:  Blood pressure 127/90, pulse 98, temperature 98.4 F (36.9 C), temperature source Oral, resp. rate 18, height 5\' 5"  (1.651 m), weight 222 lb (100.7 kg), last menstrual period 02/03/2016, SpO2 100 %.    HEENT: Neck without mass Lymphatics: No cervical, supraclavicular, axillary, or inguinal nodes Resp: Lungs clear bilaterally Cardio: Regular rate and rhythm GI: No hepatosplenomegaly, no mass, nontender Vascular: No leg edema   Portacath/PICC-without erythema   Medications: I have reviewed the patient's current medications.  Assessment/Plan: 1. 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  Cycle 2 CAPOX 01/22/2015  Cycle 3 CAPOX 02/12/2015  Cycle 4 CAPOX 03/05/2015  Cycle 5 CAPOX 03/26/2015  Cycle 6 CAPOX 04/16/2015  Cycle 7 CAPOX 05/07/2015 (oxaliplatin held due to progressive neuropathy)  Cycle 8 CAPOX 05/28/2015 (oxaliplatin held secondary to neuropathy)  CTs 11/01/2015-no significant change and an 11 mm upper abdomen mesenteric lymph node with partial calcification-treated metastasis?,  No other evidence of metastatic disease  CTs 01/21/2016-mild interval progression of previously identified calcified nodal tissue in the upper central abdomen. Development of small adjacent lymph nodes in the interval.  EUS guided biopsy of the calcified upper abdominal mass on 02/17/2016 confirmed metastatic adenocarcinoma 2. History of Microcytic anemia-likely iron deficiency anemia secondary to #1 and ongoing menses 3. Status post cholecystectomy 4. Family history of breast and ovarian cancer. She has seen the genetics counselor. 5. Delayed nausea following cycle 1 CAPOX. Aloxi and Emend added beginning with cycle 2. 6. Transient episodes of dyspnea following cycle 4 and cycle 5 CAPOX, likely neurotoxicity from oxaliplatin 7. Oxaliplatin neuropathy-improved    Disposition:  Ms. Baltes appears stable. I discussed the biopsy finding and treatment options with her. I discussed the case with Dr. Morton Stall. He recommends a staging PET scan. He will arrange for a surgical appointment at [facility] to discuss resection of the nodal mass with consideration of HIPEC.  She will return for an office visit and Port-A-Cath flush in 6 weeks. I am available to see her in the interim as needed.  25 minutes were spent with the patient today. The majority of the time was used for counseling and coordination of care.  Betsy Coder, MD  02/22/2016  9:48 AM

## 2016-02-22 NOTE — Telephone Encounter (Signed)
GAVE PATIENT AVS REPORT AND APPOINTMENTS FOR Brenda Becker. °

## 2016-02-28 ENCOUNTER — Telehealth: Payer: Self-pay | Admitting: *Deleted

## 2016-02-28 NOTE — Telephone Encounter (Signed)
Call placed to patient to inform her that Dr. [name] can see her on Friday, 03/03/16 at 0800.  Patient appreciative of call and will be here for appt on 03/03/16.

## 2016-03-03 ENCOUNTER — Telehealth: Payer: Self-pay | Admitting: Hematology and Oncology

## 2016-03-03 ENCOUNTER — Ambulatory Visit (HOSPITAL_BASED_OUTPATIENT_CLINIC_OR_DEPARTMENT_OTHER): Payer: PRIVATE HEALTH INSURANCE | Admitting: Oncology

## 2016-03-03 ENCOUNTER — Other Ambulatory Visit: Payer: Self-pay | Admitting: *Deleted

## 2016-03-03 ENCOUNTER — Encounter: Payer: Self-pay | Admitting: *Deleted

## 2016-03-03 VITALS — BP 145/70 | HR 82 | Temp 98.9°F | Resp 18 | Ht 65.0 in | Wt 221.7 lb

## 2016-03-03 DIAGNOSIS — C184 Malignant neoplasm of transverse colon: Secondary | ICD-10-CM | POA: Diagnosis not present

## 2016-03-03 DIAGNOSIS — C779 Secondary and unspecified malignant neoplasm of lymph node, unspecified: Secondary | ICD-10-CM

## 2016-03-03 MED ORDER — PROCHLORPERAZINE MALEATE 10 MG PO TABS: 10.0000 mg | ORAL_TABLET | Freq: Four times a day (QID) | ORAL | 0 refills | Status: DC | PRN

## 2016-03-03 MED ORDER — LIDOCAINE-PRILOCAINE 2.5-2.5 % EX CREA: TOPICAL_CREAM | CUTANEOUS | 1 refills | Status: DC

## 2016-03-03 NOTE — Progress Notes (Signed)
Tammy at [facility]ology notified to order Foundation One testing for patient per Dr. [name].

## 2016-03-03 NOTE — Progress Notes (Signed)
  [place] OFFICE PROGRESS NOTE   Diagnosis: Colon cancer Ms.   INTERVAL HISTORY:   Brenda Becker returns prior to a scheduled visit. She saw Dr. Johney Maine on 02/25/2016. He recommends neoadjuvant chemotherapy due to proximity of the nodal recurrence to the abdominal vasculature. She feels well. No new complaint.  Objective:  Vital signs in last 24 hours:  Blood pressure (!) 145/70, pulse 82, temperature 98.9 F (37.2 C), temperature source Oral, resp. rate 18, height 5\' 5"  (1.651 m), weight 221 lb 11.2 oz (100.6 kg), last menstrual period 02/03/2016, SpO2 99 %.  Resp: Lungs clear bilaterally Cardio: Regular rate and rhythm GI: No hepatosplenomegaly, no mass, nontender Vascular: No leg edema   Portacath/PICC-without erythema  Lab Results:  Lab Results  Component Value Date   WBC 5.5 05/28/2015   HGB 13.3 05/28/2015   HCT 38.9 05/28/2015   MCV 95.3 05/28/2015   PLT 165 05/28/2015   NEUTROABS 2.6 05/28/2015     Medications: I have reviewed the patient's current medications.  Assessment/Plan: 1. 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  Cycle 2 CAPOX 01/22/2015  Cycle 3 CAPOX 02/12/2015  Cycle 4 CAPOX 03/05/2015  Cycle 5 CAPOX 03/26/2015  Cycle 6 CAPOX 04/16/2015  Cycle 7 CAPOX 05/07/2015 (oxaliplatin held due to progressive neuropathy)  Cycle 8 CAPOX 05/28/2015 (oxaliplatin held secondary to neuropathy)  CTs 11/01/2015-no significant change and an 11 mm upper abdomen mesenteric lymph node with partial calcification-treated metastasis?, No other evidence of metastatic disease  CTs 01/21/2016-mild interval progression of previously identified calcified nodal tissue in the upper  central abdomen. Development of small adjacent lymph nodes in the interval.  EUS guided biopsy of the calcified upper abdominal mass on 02/17/2016 confirmed metastatic adenocarcinoma 2. History of Microcytic anemia-likely iron deficiency anemia secondary to #1 and ongoing menses 3. Status post cholecystectomy 4. Family history of breast and ovarian cancer. She has seen the genetics counselor. 5. Delayed nausea following cycle 1 CAPOX. Aloxi and Emend added beginning with cycle 2. 6. Transient episodes of dyspnea following cycle 4 and cycle 5 CAPOX, likely neurotoxicity from oxaliplatin 7. Oxaliplatin neuropathy-improved    Disposition:  Brenda Becker appears stable. She was seen in consultation by the surgical oncology service at [facility]. They recommend a course of systemic therapy prior to considering resection of the recurrent tumor in the abdominal lymph nodes.  She was treated with CAPOX in the adjuvant setting. I recommend FOLFIRI/Avastin. We reviewed the potential toxicities associated with this regimen including the chance for nausea/vomiting, mucositis, diarrhea, and alopecia. We discussed the reaction, CNS toxicity, thromboembolic disease, bleeding, bowel perforation, renal toxicity, and delayed wound healing associated with Avastin. She agrees to proceed. I discussed the [facility] irinotecan pharmacogenomic study with her. She does not wish to enroll on the clinical trial.  The plan is to begin a first treatment with FOLFIRI/Avastin on 03/09/2016. She will return for an office visit and cycle 2 on 03/23/2016.  Approximately 30 minutes were spent with the patient today. The majority of the time was used for counseling and coordination of care. A chemotherapy plan was entered.  Brenda Coder, MD  03/03/2016  8:38 AM

## 2016-03-03 NOTE — Progress Notes (Signed)
START OFF PATHWAY REGIMEN - Colorectal   [id number]:FOLFIRI + Bevacizumab (q14d):   A cycle is every 14 days:     Irinotecan        Dose Mod: None     Leucovorin        Dose Mod: None     5-Fluorouracil        Dose Mod: None     5-Fluorouracil        Dose Mod: None     Bevacizumab        Dose Mod: None  **Always confirm dose/schedule in your pharmacy ordering system**    Patient Characteristics: Metastatic Colorectal, First Line, Potentially Resectable, KRAS Mutation Positive/Unknown, BRAF Wild-Type/Unknown Current evidence of distant metastases? Yes AJCC T Category: Staged < 8th Ed. AJCC N Category: Staged < 8th Ed. AJCC M Category: M1a AJCC 8 Stage Grouping: IVA BRAF Mutation Status: Awaiting Test Results KRAS/NRAS Mutation Status: Awaiting Test Results Line of therapy: First Line Would you be surprised if this patient died  in the next year? I would be surprised if this patient died in the next year  Intent of Therapy: Curative Intent, Discussed with Patient

## 2016-03-03 NOTE — Telephone Encounter (Signed)
Gave patient AVS and calender . Scheduled per 03/03/2016 los

## 2016-03-05 ENCOUNTER — Other Ambulatory Visit: Payer: Self-pay | Admitting: Oncology

## 2016-03-09 ENCOUNTER — Other Ambulatory Visit (HOSPITAL_BASED_OUTPATIENT_CLINIC_OR_DEPARTMENT_OTHER): Payer: PRIVATE HEALTH INSURANCE

## 2016-03-09 ENCOUNTER — Ambulatory Visit (HOSPITAL_BASED_OUTPATIENT_CLINIC_OR_DEPARTMENT_OTHER): Payer: PRIVATE HEALTH INSURANCE

## 2016-03-09 VITALS — BP 134/80 | HR 81 | Temp 98.5°F | Resp 17

## 2016-03-09 DIAGNOSIS — C184 Malignant neoplasm of transverse colon: Secondary | ICD-10-CM | POA: Diagnosis not present

## 2016-03-09 DIAGNOSIS — Z5112 Encounter for antineoplastic immunotherapy: Secondary | ICD-10-CM

## 2016-03-09 DIAGNOSIS — C779 Secondary and unspecified malignant neoplasm of lymph node, unspecified: Secondary | ICD-10-CM

## 2016-03-09 DIAGNOSIS — Z5111 Encounter for antineoplastic chemotherapy: Secondary | ICD-10-CM

## 2016-03-09 LAB — COMPREHENSIVE METABOLIC PANEL
ALT: 25 U/L (ref 0–55)
ANION GAP: 9 meq/L (ref 3–11)
AST: 19 U/L (ref 5–34)
Albumin: 3.9 g/dL (ref 3.5–5.0)
Alkaline Phosphatase: 98 U/L (ref 40–150)
BUN: 8.1 mg/dL (ref 7.0–26.0)
CALCIUM: 9.5 mg/dL (ref 8.4–10.4)
CHLORIDE: 109 meq/L (ref 98–109)
CO2: 25 mEq/L (ref 22–29)
Creatinine: 0.8 mg/dL (ref 0.6–1.1)
EGFR: 86 mL/min/{1.73_m2} — AB (ref >90)
Glucose: 125 mg/dl (ref 70–140)
POTASSIUM: 4.1 meq/L (ref 3.5–5.1)
Sodium: 143 mEq/L (ref 136–145)
Total Bilirubin: 0.54 mg/dL (ref 0.20–1.20)
Total Protein: 7.4 g/dL (ref 6.4–8.3)

## 2016-03-09 LAB — CBC WITH DIFFERENTIAL/PLATELET
BASO%: 0.5 % (ref 0.0–2.0)
BASOS ABS: 0 10*3/uL (ref 0.0–0.1)
EOS%: 2.3 % (ref 0.0–7.0)
Eosinophils Absolute: 0.2 10*3/uL (ref 0.0–0.5)
HEMATOCRIT: 39.8 % (ref 34.8–46.6)
HGB: 13.3 g/dL (ref 11.6–15.9)
LYMPH#: 1.9 10*3/uL (ref 0.9–3.3)
LYMPH%: 28 % (ref 14.0–49.7)
MCH: 28.5 pg (ref 25.1–34.0)
MCHC: 33.4 g/dL (ref 31.5–36.0)
MCV: 85.2 fL (ref 79.5–101.0)
MONO#: 0.4 10*3/uL (ref 0.1–0.9)
MONO%: 6.1 % (ref 0.0–14.0)
NEUT#: 4.2 10*3/uL (ref 1.5–6.5)
NEUT%: 63.1 % (ref 38.4–76.8)
PLATELETS: 220 10*3/uL (ref 145–400)
RBC: 4.67 10*6/uL (ref 3.70–5.45)
RDW: 14.1 % (ref 11.2–14.5)
WBC: 6.6 10*3/uL (ref 3.9–10.3)

## 2016-03-09 LAB — UA PROTEIN, DIPSTICK - CHCC: PROTEIN: NEGATIVE mg/dL

## 2016-03-09 LAB — CEA (IN HOUSE-CHCC): CEA (CHCC-In House): 1.75 ng/mL (ref 0.00–5.00)

## 2016-03-09 MED ORDER — SODIUM CHLORIDE 0.9 % IV SOLN
2325.0000 mg/m2 | INTRAVENOUS | Status: DC
  Administered 2016-03-09: 5000 mg via INTRAVENOUS
  Filled 2016-03-09: qty 100

## 2016-03-09 MED ORDER — IRINOTECAN HCL CHEMO INJECTION 100 MG/5ML
180.0000 mg/m2 | Freq: Once | INTRAVENOUS | Status: AC
  Administered 2016-03-09: 380 mg via INTRAVENOUS
  Filled 2016-03-09: qty 15

## 2016-03-09 MED ORDER — DEXAMETHASONE SODIUM PHOSPHATE 10 MG/ML IJ SOLN
10.0000 mg | Freq: Once | INTRAMUSCULAR | Status: AC
  Administered 2016-03-09: 10 mg via INTRAVENOUS

## 2016-03-09 MED ORDER — LEUCOVORIN CALCIUM INJECTION 350 MG
400.0000 mg/m2 | Freq: Once | INTRAVENOUS | Status: AC
  Administered 2016-03-09: 860 mg via INTRAVENOUS
  Filled 2016-03-09: qty 43

## 2016-03-09 MED ORDER — FLUOROURACIL CHEMO INJECTION 2.5 GM/50ML
400.0000 mg/m2 | Freq: Once | INTRAVENOUS | Status: AC
  Administered 2016-03-09: 850 mg via INTRAVENOUS
  Filled 2016-03-09: qty 17

## 2016-03-09 MED ORDER — PALONOSETRON HCL INJECTION 0.25 MG/5ML
INTRAVENOUS | Status: AC
  Filled 2016-03-09: qty 5

## 2016-03-09 MED ORDER — SODIUM CHLORIDE 0.9 % IV SOLN
5.0000 mg/kg | Freq: Once | INTRAVENOUS | Status: AC
  Administered 2016-03-09: 500 mg via INTRAVENOUS
  Filled 2016-03-09: qty 16

## 2016-03-09 MED ORDER — DEXAMETHASONE SODIUM PHOSPHATE 10 MG/ML IJ SOLN
INTRAMUSCULAR | Status: AC
  Filled 2016-03-09: qty 1

## 2016-03-09 MED ORDER — SODIUM CHLORIDE 0.9 % IV SOLN
Freq: Once | INTRAVENOUS | Status: AC
  Administered 2016-03-09: 10:00:00 via INTRAVENOUS

## 2016-03-09 MED ORDER — PALONOSETRON HCL INJECTION 0.25 MG/5ML
0.2500 mg | Freq: Once | INTRAVENOUS | Status: AC
  Administered 2016-03-09: 0.25 mg via INTRAVENOUS

## 2016-03-09 NOTE — Patient Instructions (Signed)
[facility] Discharge Instructions for Patients Receiving Chemotherapy  Today you received the following chemotherapy agents: Avastin, Leucovorin, Irinotecan, Adrucil   To help prevent nausea and vomiting after your treatment, we encourage you to take your nausea medication {as prescribed.    If you develop nausea and vomiting that is not controlled by your nausea medication, call the clinic.   BELOW ARE SYMPTOMS THAT SHOULD BE REPORTED IMMEDIATELY:  *FEVER GREATER THAN 100.5 F  *CHILLS WITH OR WITHOUT FEVER  NAUSEA AND VOMITING THAT IS NOT CONTROLLED WITH YOUR NAUSEA MEDICATION  *UNUSUAL SHORTNESS OF BREATH  *UNUSUAL BRUISING OR BLEEDING  TENDERNESS IN MOUTH AND THROAT WITH OR WITHOUT PRESENCE OF ULCERS  *URINARY PROBLEMS  *BOWEL PROBLEMS  UNUSUAL RASH Items with * indicate a potential emergency and should be followed up as soon as possible.  Feel free to call the clinic you have any questions or concerns. The clinic phone number is [phone].  Please show the [facility] at check-in to the Emergency Department and triage nurse.  Bevacizumab injection What is this medicine? BEVACIZUMAB (be va SIZ yoo mab) is a monoclonal antibody. It is used to treat many types of cancer. This medicine may be used for other purposes; ask your health care provider or pharmacist if you have questions. COMMON BRAND NAME(S): Avastin What should I tell my health care provider before I take this medicine? They need to know if you have any of these conditions: -diabetes -heart disease -high blood pressure -history of coughing up blood -prior anthracycline chemotherapy (e.g., doxorubicin, daunorubicin, epirubicin) -recent or ongoing radiation therapy -recent or planning to have surgery -stroke -an unusual or allergic reaction to bevacizumab, hamster proteins, mouse proteins, other medicines, foods, dyes, or preservatives -pregnant or trying to get  pregnant -breast-feeding How should I use this medicine? This medicine is for infusion into a vein. It is given by a health care professional in a hospital or clinic setting. Talk to your pediatrician regarding the use of this medicine in children. Special care may be needed. Overdosage: If you think you have taken too much of this medicine contact a poison control center or emergency room at once. NOTE: This medicine is only for you. Do not share this medicine with others. What if I miss a dose? It is important not to miss your dose. Call your doctor or health care professional if you are unable to keep an appointment. What may interact with this medicine? Interactions are not expected. This list may not describe all possible interactions. Give your health care provider a list of all the medicines, herbs, non-prescription drugs, or dietary supplements you use. Also tell them if you smoke, drink alcohol, or use illegal drugs. Some items may interact with your medicine. What should I watch for while using this medicine? Your condition will be monitored carefully while you are receiving this medicine. You will need important blood work and urine testing done while you are taking this medicine. This medicine may increase your risk to bruise or bleed. Call your doctor or health care professional if you notice any unusual bleeding. This medicine should be started at least 28 days following major surgery and the site of the surgery should be totally healed. Check with your doctor before scheduling dental work or surgery while you are receiving this treatment. Talk to your doctor if you have recently had surgery or if you have a wound that has not healed. Do not become pregnant while taking this medicine  or for 6 months after stopping it. Women should inform their doctor if they wish to become pregnant or think they might be pregnant. There is a potential for serious side effects to an unborn child. Talk to  your health care professional or pharmacist for more information. Do not breast-feed an infant while taking this medicine and for 6 months after the last dose. This medicine has caused ovarian failure in some women. This medicine may interfere with the ability to have a child. You should talk to your doctor or health care professional if you are concerned about your fertility. What side effects may I notice from receiving this medicine? Side effects that you should report to your doctor or health care professional as soon as possible: -allergic reactions like skin rash, itching or hives, swelling of the face, lips, or tongue -chest pain or chest tightness -chills -coughing up blood -high fever -seizures -severe constipation -signs and symptoms of bleeding such as bloody or black, tarry stools; red or dark-brown urine; spitting up blood or brown material that looks like coffee grounds; red spots on the skin; unusual bruising or bleeding from the eye, gums, or nose -signs and symptoms of a blood clot such as breathing problems; chest pain; severe, sudden headache; pain, swelling, warmth in the leg -signs and symptoms of a stroke like changes in vision; confusion; trouble speaking or understanding; severe headaches; sudden numbness or weakness of the face, arm or leg; trouble walking; dizziness; loss of balance or coordination -stomach pain -sweating -swelling of legs or ankles -vomiting -weight gain Side effects that usually do not require medical attention (report to your doctor or health care professional if they continue or are bothersome): -back pain -changes in taste -decreased appetite -dry skin -nausea -tiredness This list may not describe all possible side effects. Call your doctor for medical advice about side effects. You may report side effects to FDA at 1-800-FDA-1088. Where should I keep my medicine? This drug is given in a hospital or clinic and will not be stored at  home. NOTE: This sheet is a summary. It may not cover all possible information. If you have questions about this medicine, talk to your doctor, pharmacist, or health care provider.  2018 Elsevier/Gold Standard (2015-12-17 14:33:29) Irinotecan injection What is this medicine? IRINOTECAN (ir in oh TEE kan ) is a chemotherapy drug. It is used to treat colon and rectal cancer. This medicine may be used for other purposes; ask your health care provider or pharmacist if you have questions. COMMON BRAND NAME(S): Camptosar What should I tell my health care provider before I take this medicine? They need to know if you have any of these conditions: -blood disorders -dehydration -diarrhea -infection (especially a virus infection such as chickenpox, cold sores, or herpes) -liver disease -low blood counts, like low white cell, platelet, or red cell counts -recent or ongoing radiation therapy -an unusual or allergic reaction to irinotecan, sorbitol, other chemotherapy, other medicines, foods, dyes, or preservatives -pregnant or trying to get pregnant -breast-feeding How should I use this medicine? This drug is given as an infusion into a vein. It is administered in a hospital or clinic by a specially trained health care professional. Talk to your pediatrician regarding the use of this medicine in children. Special care may be needed. Overdosage: If you think you have taken too much of this medicine contact a poison control center or emergency room at once. NOTE: This medicine is only for you. Do not share this medicine  with others. What if I miss a dose? It is important not to miss your dose. Call your doctor or health care professional if you are unable to keep an appointment. What may interact with this medicine? Do not take this medicine with any of the following medications: -atazanavir -certain medicines for fungal infections like itraconazole and ketoconazole -St. John's Wort This medicine  may also interact with the following medications: -dexamethasone -diuretics -laxatives -medicines for seizures like carbamazepine, mephobarbital, phenobarbital, phenytoin, primidone -medicines to increase blood counts like filgrastim, pegfilgrastim, sargramostim -prochlorperazine -vaccines This list may not describe all possible interactions. Give your health care provider a list of all the medicines, herbs, non-prescription drugs, or dietary supplements you use. Also tell them if you smoke, drink alcohol, or use illegal drugs. Some items may interact with your medicine. What should I watch for while using this medicine? Your condition will be monitored carefully while you are receiving this medicine. You will need important blood work done while you are taking this medicine. This drug may make you feel generally unwell. This is not uncommon, as chemotherapy can affect healthy cells as well as cancer cells. Report any side effects. Continue your course of treatment even though you feel ill unless your doctor tells you to stop. In some cases, you may be given additional medicines to help with side effects. Follow all directions for their use. You may get drowsy or dizzy. Do not drive, use machinery, or do anything that needs mental alertness until you know how this medicine affects you. Do not stand or sit up quickly, especially if you are an older patient. This reduces the risk of dizzy or fainting spells. Call your doctor or health care professional for advice if you get a fever, chills or sore throat, or other symptoms of a cold or flu. Do not treat yourself. This drug decreases your body's ability to fight infections. Try to avoid being around people who are sick. This medicine may increase your risk to bruise or bleed. Call your doctor or health care professional if you notice any unusual bleeding. Be careful brushing and flossing your teeth or using a toothpick because you may get an infection  or bleed more easily. If you have any dental work done, tell your dentist you are receiving this medicine. Avoid taking products that contain aspirin, acetaminophen, ibuprofen, naproxen, or ketoprofen unless instructed by your doctor. These medicines may hide a fever. Do not become pregnant while taking this medicine. Women should inform their doctor if they wish to become pregnant or think they might be pregnant. There is a potential for serious side effects to an unborn child. Talk to your health care professional or pharmacist for more information. Do not breast-feed an infant while taking this medicine. What side effects may I notice from receiving this medicine? Side effects that you should report to your doctor or health care professional as soon as possible: -allergic reactions like skin rash, itching or hives, swelling of the face, lips, or tongue -low blood counts - this medicine may decrease the number of white blood cells, red blood cells and platelets. You may be at increased risk for infections and bleeding. -signs of infection - fever or chills, cough, sore throat, pain or difficulty passing urine -signs of decreased platelets or bleeding - bruising, pinpoint red spots on the skin, black, tarry stools, blood in the urine -signs of decreased red blood cells - unusually weak or tired, fainting spells, lightheadedness -breathing problems -chest pain -diarrhea -  feeling faint or lightheaded, falls -flushing, runny nose, sweating during infusion -mouth sores or pain -pain, swelling, redness or irritation where injected -pain, swelling, warmth in the leg -pain, tingling, numbness in the hands or feet -problems with balance, talking, walking -stomach cramps, pain -trouble passing urine or change in the amount of urine -vomiting as to be unable to hold down drinks or food -yellowing of the eyes or skin Side effects that usually do not require medical attention (report to your doctor or  health care professional if they continue or are bothersome): -constipation -hair loss -headache -loss of appetite -nausea, vomiting -stomach upset This list may not describe all possible side effects. Call your doctor for medical advice about side effects. You may report side effects to FDA at 1-800-FDA-1088. Where should I keep my medicine? This drug is given in a hospital or clinic and will not be stored at home. NOTE: This sheet is a summary. It may not cover all possible information. If you have questions about this medicine, talk to your doctor, pharmacist, or health care provider.  2018 Elsevier/Gold Standard (2012-06-17 16:29:32) Leucovorin injection What is this medicine? LEUCOVORIN (loo koe VOR in) is used to prevent or treat the harmful effects of some medicines. This medicine is used to treat anemia caused by a low amount of folic acid in the body. It is also used with 5-fluorouracil (5-FU) to treat colon cancer. This medicine may be used for other purposes; ask your health care provider or pharmacist if you have questions. What should I tell my health care provider before I take this medicine? They need to know if you have any of these conditions: -anemia from low levels of vitamin B-12 in the blood -an unusual or allergic reaction to leucovorin, folic acid, other medicines, foods, dyes, or preservatives -pregnant or trying to get pregnant -breast-feeding How should I use this medicine? This medicine is for injection into a muscle or into a vein. It is given by a health care professional in a hospital or clinic setting. Talk to your pediatrician regarding the use of this medicine in children. Special care may be needed. Overdosage: If you think you have taken too much of this medicine contact a poison control center or emergency room at once. NOTE: This medicine is only for you. Do not share this medicine with others. What if I miss a dose? This does not apply. What may  interact with this medicine? -capecitabine -fluorouracil -phenobarbital -phenytoin -primidone -trimethoprim-sulfamethoxazole This list may not describe all possible interactions. Give your health care provider a list of all the medicines, herbs, non-prescription drugs, or dietary supplements you use. Also tell them if you smoke, drink alcohol, or use illegal drugs. Some items may interact with your medicine. What should I watch for while using this medicine? Your condition will be monitored carefully while you are receiving this medicine. This medicine may increase the side effects of 5-fluorouracil, 5-FU. Tell your doctor or health care professional if you have diarrhea or mouth sores that do not get better or that get worse. What side effects may I notice from receiving this medicine? Side effects that you should report to your doctor or health care professional as soon as possible: -allergic reactions like skin rash, itching or hives, swelling of the face, lips, or tongue -breathing problems -fever, infection -mouth sores -unusual bleeding or bruising -unusually weak or tired Side effects that usually do not require medical attention (report to your doctor or health care professional if they  continue or are bothersome): -constipation or diarrhea -loss of appetite -nausea, vomiting This list may not describe all possible side effects. Call your doctor for medical advice about side effects. You may report side effects to FDA at 1-800-FDA-1088. Where should I keep my medicine? This drug is given in a hospital or clinic and will not be stored at home. NOTE: This sheet is a summary. It may not cover all possible information. If you have questions about this medicine, talk to your doctor, pharmacist, or health care provider.  2018 Elsevier/Gold Standard (2007-06-25 16:50:29) Fluorouracil, 5-FU injection What is this medicine? FLUOROURACIL, 5-FU (flure oh YOOR a sil) is a chemotherapy  drug. It slows the growth of cancer cells. This medicine is used to treat many types of cancer like breast cancer, colon or rectal cancer, pancreatic cancer, and stomach cancer. This medicine may be used for other purposes; ask your health care provider or pharmacist if you have questions. COMMON BRAND NAME(S): Adrucil What should I tell my health care provider before I take this medicine? They need to know if you have any of these conditions: -blood disorders -dihydropyrimidine dehydrogenase (DPD) deficiency -infection (especially a virus infection such as chickenpox, cold sores, or herpes) -kidney disease -liver disease -malnourished, poor nutrition -recent or ongoing radiation therapy -an unusual or allergic reaction to fluorouracil, other chemotherapy, other medicines, foods, dyes, or preservatives -pregnant or trying to get pregnant -breast-feeding How should I use this medicine? This drug is given as an infusion or injection into a vein. It is administered in a hospital or clinic by a specially trained health care professional. Talk to your pediatrician regarding the use of this medicine in children. Special care may be needed. Overdosage: If you think you have taken too much of this medicine contact a poison control center or emergency room at once. NOTE: This medicine is only for you. Do not share this medicine with others. What if I miss a dose? It is important not to miss your dose. Call your doctor or health care professional if you are unable to keep an appointment. What may interact with this medicine? -allopurinol -cimetidine -dapsone -digoxin -hydroxyurea -leucovorin -levamisole -medicines for seizures like ethotoin, fosphenytoin, phenytoin -medicines to increase blood counts like filgrastim, pegfilgrastim, sargramostim -medicines that treat or prevent blood clots like warfarin, enoxaparin, and dalteparin -methotrexate -metronidazole -pyrimethamine -some other  chemotherapy drugs like busulfan, cisplatin, estramustine, vinblastine -trimethoprim -trimetrexate -vaccines Talk to your doctor or health care professional before taking any of these medicines: -acetaminophen -aspirin -ibuprofen -ketoprofen -naproxen This list may not describe all possible interactions. Give your health care provider a list of all the medicines, herbs, non-prescription drugs, or dietary supplements you use. Also tell them if you smoke, drink alcohol, or use illegal drugs. Some items may interact with your medicine. What should I watch for while using this medicine? Visit your doctor for checks on your progress. This drug may make you feel generally unwell. This is not uncommon, as chemotherapy can affect healthy cells as well as cancer cells. Report any side effects. Continue your course of treatment even though you feel ill unless your doctor tells you to stop. In some cases, you may be given additional medicines to help with side effects. Follow all directions for their use. Call your doctor or health care professional for advice if you get a fever, chills or sore throat, or other symptoms of a cold or flu. Do not treat yourself. This drug decreases your body's ability to fight infections.  Try to avoid being around people who are sick. This medicine may increase your risk to bruise or bleed. Call your doctor or health care professional if you notice any unusual bleeding. Be careful brushing and flossing your teeth or using a toothpick because you may get an infection or bleed more easily. If you have any dental work done, tell your dentist you are receiving this medicine. Avoid taking products that contain aspirin, acetaminophen, ibuprofen, naproxen, or ketoprofen unless instructed by your doctor. These medicines may hide a fever. Do not become pregnant while taking this medicine. Women should inform their doctor if they wish to become pregnant or think they might be pregnant.  There is a potential for serious side effects to an unborn child. Talk to your health care professional or pharmacist for more information. Do not breast-feed an infant while taking this medicine. Men should inform their doctor if they wish to father a child. This medicine may lower sperm counts. Do not treat diarrhea with over the counter products. Contact your doctor if you have diarrhea that lasts more than 2 days or if it is severe and watery. This medicine can make you more sensitive to the sun. Keep out of the sun. If you cannot avoid being in the sun, wear protective clothing and use sunscreen. Do not use sun lamps or tanning beds/booths. What side effects may I notice from receiving this medicine? Side effects that you should report to your doctor or health care professional as soon as possible: -allergic reactions like skin rash, itching or hives, swelling of the face, lips, or tongue -low blood counts - this medicine may decrease the number of white blood cells, red blood cells and platelets. You may be at increased risk for infections and bleeding. -signs of infection - fever or chills, cough, sore throat, pain or difficulty passing urine -signs of decreased platelets or bleeding - bruising, pinpoint red spots on the skin, black, tarry stools, blood in the urine -signs of decreased red blood cells - unusually weak or tired, fainting spells, lightheadedness -breathing problems -changes in vision -chest pain -mouth sores -nausea and vomiting -pain, swelling, redness at site where injected -pain, tingling, numbness in the hands or feet -redness, swelling, or sores on hands or feet -stomach pain -unusual bleeding Side effects that usually do not require medical attention (report to your doctor or health care professional if they continue or are bothersome): -changes in finger or toe nails -diarrhea -dry or itchy skin -hair loss -headache -loss of appetite -sensitivity of eyes to  the light -stomach upset -unusually teary eyes This list may not describe all possible side effects. Call your doctor for medical advice about side effects. You may report side effects to FDA at 1-800-FDA-1088. Where should I keep my medicine? This drug is given in a hospital or clinic and will not be stored at home. NOTE: This sheet is a summary. It may not cover all possible information. If you have questions about this medicine, talk to your doctor, pharmacist, or health care provider.  2018 Elsevier/Gold Standard (2007-04-24 13:53:16)

## 2016-03-10 ENCOUNTER — Ambulatory Visit (HOSPITAL_COMMUNITY): Payer: PRIVATE HEALTH INSURANCE

## 2016-03-11 ENCOUNTER — Ambulatory Visit (HOSPITAL_BASED_OUTPATIENT_CLINIC_OR_DEPARTMENT_OTHER): Payer: PRIVATE HEALTH INSURANCE

## 2016-03-11 VITALS — BP 148/88 | HR 71 | Temp 98.8°F | Resp 16

## 2016-03-11 DIAGNOSIS — C184 Malignant neoplasm of transverse colon: Secondary | ICD-10-CM | POA: Diagnosis not present

## 2016-03-11 DIAGNOSIS — Z452 Encounter for adjustment and management of vascular access device: Secondary | ICD-10-CM

## 2016-03-11 MED ORDER — SODIUM CHLORIDE 0.9% FLUSH
10.0000 mL | INTRAVENOUS | Status: DC | PRN
  Administered 2016-03-11: 10 mL
  Filled 2016-03-11: qty 10

## 2016-03-11 MED ORDER — HEPARIN SOD (PORK) LOCK FLUSH 100 UNIT/ML IV SOLN
500.0000 [IU] | Freq: Once | INTRAVENOUS | Status: AC | PRN
  Administered 2016-03-11: 500 [IU]
  Filled 2016-03-11: qty 5

## 2016-03-19 ENCOUNTER — Other Ambulatory Visit: Payer: Self-pay | Admitting: Oncology

## 2016-03-20 ENCOUNTER — Encounter: Payer: Self-pay | Admitting: *Deleted

## 2016-03-20 NOTE — Progress Notes (Signed)
Fax received from [facility] stating that they have obtained patients specimen for [facility] test and began working on request on 03/13/16.

## 2016-03-23 ENCOUNTER — Ambulatory Visit: Payer: PRIVATE HEALTH INSURANCE

## 2016-03-23 ENCOUNTER — Other Ambulatory Visit (HOSPITAL_BASED_OUTPATIENT_CLINIC_OR_DEPARTMENT_OTHER): Payer: PRIVATE HEALTH INSURANCE

## 2016-03-23 ENCOUNTER — Ambulatory Visit (HOSPITAL_BASED_OUTPATIENT_CLINIC_OR_DEPARTMENT_OTHER): Payer: PRIVATE HEALTH INSURANCE | Admitting: Nurse Practitioner

## 2016-03-23 ENCOUNTER — Ambulatory Visit (HOSPITAL_BASED_OUTPATIENT_CLINIC_OR_DEPARTMENT_OTHER): Payer: PRIVATE HEALTH INSURANCE

## 2016-03-23 ENCOUNTER — Telehealth: Payer: Self-pay | Admitting: Oncology

## 2016-03-23 VITALS — BP 136/92

## 2016-03-23 VITALS — BP 149/97 | HR 73 | Temp 99.1°F | Resp 18 | Ht 65.0 in | Wt 221.0 lb

## 2016-03-23 DIAGNOSIS — R197 Diarrhea, unspecified: Secondary | ICD-10-CM

## 2016-03-23 DIAGNOSIS — R030 Elevated blood-pressure reading, without diagnosis of hypertension: Secondary | ICD-10-CM

## 2016-03-23 DIAGNOSIS — Z5111 Encounter for antineoplastic chemotherapy: Secondary | ICD-10-CM

## 2016-03-23 DIAGNOSIS — Z5112 Encounter for antineoplastic immunotherapy: Secondary | ICD-10-CM

## 2016-03-23 DIAGNOSIS — K649 Unspecified hemorrhoids: Secondary | ICD-10-CM

## 2016-03-23 DIAGNOSIS — C184 Malignant neoplasm of transverse colon: Secondary | ICD-10-CM

## 2016-03-23 DIAGNOSIS — Z95828 Presence of other vascular implants and grafts: Secondary | ICD-10-CM

## 2016-03-23 LAB — CBC WITH DIFFERENTIAL/PLATELET
BASO%: 1.3 % (ref 0.0–2.0)
Basophils Absolute: 0.1 10*3/uL (ref 0.0–0.1)
EOS%: 3.7 % (ref 0.0–7.0)
Eosinophils Absolute: 0.1 10*3/uL (ref 0.0–0.5)
HCT: 39.2 % (ref 34.8–46.6)
HEMOGLOBIN: 13.2 g/dL (ref 11.6–15.9)
LYMPH%: 41.7 % (ref 14.0–49.7)
MCH: 28.2 pg (ref 25.1–34.0)
MCHC: 33.6 g/dL (ref 31.5–36.0)
MCV: 84 fL (ref 79.5–101.0)
MONO#: 0.3 10*3/uL (ref 0.1–0.9)
MONO%: 8.9 % (ref 0.0–14.0)
NEUT%: 44.4 % (ref 38.4–76.8)
NEUTROS ABS: 1.7 10*3/uL (ref 1.5–6.5)
PLATELETS: 198 10*3/uL (ref 145–400)
RBC: 4.66 10*6/uL (ref 3.70–5.45)
RDW: 14.2 % (ref 11.2–14.5)
WBC: 3.9 10*3/uL (ref 3.9–10.3)
lymph#: 1.6 10*3/uL (ref 0.9–3.3)

## 2016-03-23 LAB — COMPREHENSIVE METABOLIC PANEL
ALBUMIN: 3.7 g/dL (ref 3.5–5.0)
ALK PHOS: 99 U/L (ref 40–150)
ALT: 56 U/L — AB (ref 0–55)
AST: 32 U/L (ref 5–34)
Anion Gap: 8 mEq/L (ref 3–11)
BUN: 10.9 mg/dL (ref 7.0–26.0)
CALCIUM: 9.3 mg/dL (ref 8.4–10.4)
CO2: 25 mEq/L (ref 22–29)
CREATININE: 0.7 mg/dL (ref 0.6–1.1)
Chloride: 109 mEq/L (ref 98–109)
EGFR: >90 mL/min/{1.73_m2} (ref >90)
Glucose: 96 mg/dl (ref 70–140)
Potassium: 4.1 mEq/L (ref 3.5–5.1)
Sodium: 141 mEq/L (ref 136–145)
Total Bilirubin: 0.64 mg/dL (ref 0.20–1.20)
Total Protein: 7.1 g/dL (ref 6.4–8.3)

## 2016-03-23 MED ORDER — ATROPINE SULFATE 1 MG/ML IJ SOLN
0.5000 mg | Freq: Once | INTRAMUSCULAR | Status: AC | PRN
  Administered 2016-03-23: 0.5 mg via INTRAVENOUS

## 2016-03-23 MED ORDER — DEXAMETHASONE SODIUM PHOSPHATE 10 MG/ML IJ SOLN
10.0000 mg | Freq: Once | INTRAMUSCULAR | Status: AC
Start: 2016-03-23 — End: 2016-03-23
  Administered 2016-03-23: 10 mg via INTRAVENOUS

## 2016-03-23 MED ORDER — FLUOROURACIL CHEMO INJECTION 2.5 GM/50ML
400.0000 mg/m2 | Freq: Once | INTRAVENOUS | Status: AC
  Administered 2016-03-23: 850 mg via INTRAVENOUS
  Filled 2016-03-23: qty 17

## 2016-03-23 MED ORDER — DIPHENOXYLATE-ATROPINE 2.5-0.025 MG PO TABS: 2.0000 | ORAL_TABLET | Freq: Four times a day (QID) | ORAL | 0 refills | Status: DC | PRN

## 2016-03-23 MED ORDER — DEXTROSE 5 % IV SOLN
180.0000 mg/m2 | Freq: Once | INTRAVENOUS | Status: AC
  Administered 2016-03-23: 380 mg via INTRAVENOUS
  Filled 2016-03-23: qty 4

## 2016-03-23 MED ORDER — SODIUM CHLORIDE 0.9 % IJ SOLN
10.0000 mL | INTRAMUSCULAR | Status: DC | PRN
  Administered 2016-03-23: 10 mL via INTRAVENOUS
  Filled 2016-03-23: qty 10

## 2016-03-23 MED ORDER — BEVACIZUMAB CHEMO INJECTION 400 MG/16ML
5.0000 mg/kg | Freq: Once | INTRAVENOUS | Status: AC
  Administered 2016-03-23: 500 mg via INTRAVENOUS
  Filled 2016-03-23: qty 16

## 2016-03-23 MED ORDER — SODIUM CHLORIDE 0.9% FLUSH
10.0000 mL | INTRAVENOUS | Status: DC | PRN
  Filled 2016-03-23: qty 10

## 2016-03-23 MED ORDER — PALONOSETRON HCL INJECTION 0.25 MG/5ML
0.2500 mg | Freq: Once | INTRAVENOUS | Status: AC
  Administered 2016-03-23: 0.25 mg via INTRAVENOUS

## 2016-03-23 MED ORDER — ATROPINE SULFATE 1 MG/ML IJ SOLN
INTRAMUSCULAR | Status: AC
  Filled 2016-03-23: qty 1

## 2016-03-23 MED ORDER — LEUCOVORIN CALCIUM INJECTION 350 MG
400.0000 mg/m2 | Freq: Once | INTRAMUSCULAR | Status: AC
  Administered 2016-03-23: 860 mg via INTRAVENOUS
  Filled 2016-03-23: qty 43

## 2016-03-23 MED ORDER — SODIUM CHLORIDE 0.9 % IV SOLN
2325.0000 mg/m2 | INTRAVENOUS | Status: DC
  Administered 2016-03-23: 5000 mg via INTRAVENOUS
  Filled 2016-03-23: qty 100

## 2016-03-23 MED ORDER — PALONOSETRON HCL INJECTION 0.25 MG/5ML
INTRAVENOUS | Status: AC
  Filled 2016-03-23: qty 5

## 2016-03-23 MED ORDER — HEPARIN SOD (PORK) LOCK FLUSH 100 UNIT/ML IV SOLN
500.0000 [IU] | Freq: Once | INTRAVENOUS | Status: DC | PRN
  Filled 2016-03-23: qty 5

## 2016-03-23 MED ORDER — SODIUM CHLORIDE 0.9 % IV SOLN
Freq: Once | INTRAVENOUS | Status: AC
  Administered 2016-03-23: 14:00:00 via INTRAVENOUS

## 2016-03-23 MED ORDER — DEXAMETHASONE SODIUM PHOSPHATE 10 MG/ML IJ SOLN
INTRAMUSCULAR | Status: AC
  Filled 2016-03-23: qty 1

## 2016-03-23 NOTE — Telephone Encounter (Signed)
appts made and avs printed per LOS

## 2016-03-23 NOTE — Patient Instructions (Signed)
[facility] Discharge Instructions for Patients Receiving Chemotherapy  Today you received the following chemotherapy agents: Avastin, Irinotecan, Leucovorin and Adrucil   To help prevent nausea and vomiting after your treatment, we encourage you to take your nausea medication as directed.    If you develop nausea and vomiting that is not controlled by your nausea medication, call the clinic.   BELOW ARE SYMPTOMS THAT SHOULD BE REPORTED IMMEDIATELY:  *FEVER GREATER THAN 100.5 F  *CHILLS WITH OR WITHOUT FEVER  NAUSEA AND VOMITING THAT IS NOT CONTROLLED WITH YOUR NAUSEA MEDICATION  *UNUSUAL SHORTNESS OF BREATH  *UNUSUAL BRUISING OR BLEEDING  TENDERNESS IN MOUTH AND THROAT WITH OR WITHOUT PRESENCE OF ULCERS  *URINARY PROBLEMS  *BOWEL PROBLEMS  UNUSUAL RASH Items with * indicate a potential emergency and should be followed up as soon as possible.  Feel free to call the clinic you have any questions or concerns. The clinic phone number is [phone].  Please show the CHEMO ALERT CARD at check-in to the Emergency Department and triage nurse.   

## 2016-03-23 NOTE — Patient Instructions (Signed)
Implanted Port Home Guide An implanted port is a type of central line that is placed under the skin. Central lines are used to provide IV access when treatment or nutrition needs to be given through a person's veins. Implanted ports are used for long-term IV access. An implanted port may be placed because:  You need IV medicine that would be irritating to the small veins in your hands or arms.  You need long-term IV medicines, such as antibiotics.  You need IV nutrition for a long period.  You need frequent blood draws for lab tests.  You need dialysis.  Implanted ports are usually placed in the chest area, but they can also be placed in the upper arm, the abdomen, or the leg. An implanted port has two main parts:  Reservoir. The reservoir is round and will appear as a small, raised area under your skin. The reservoir is the part where a needle is inserted to give medicines or draw blood.  Catheter. The catheter is a thin, flexible tube that extends from the reservoir. The catheter is placed into a large vein. Medicine that is inserted into the reservoir goes into the catheter and then into the vein.  How will I care for my incision site? Do not get the incision site wet. Bathe or shower as directed by your health care provider. How is my port accessed? Special steps must be taken to access the port:  Before the port is accessed, a numbing cream can be placed on the skin. This helps numb the skin over the port site.  Your health care provider uses a sterile technique to access the port. ? Your health care provider must put on a mask and sterile gloves. ? The skin over your port is cleaned carefully with an antiseptic and allowed to dry. ? The port is gently pinched between sterile gloves, and a needle is inserted into the port.  Only "non-coring" port needles should be used to access the port. Once the port is accessed, a blood return should be checked. This helps ensure that the port  is in the vein and is not clogged.  If your port needs to remain accessed for a constant infusion, a clear (transparent) bandage will be placed over the needle site. The bandage and needle will need to be changed every week, or as directed by your health care provider.  Keep the bandage covering the needle clean and dry. Do not get it wet. Follow your health care provider's instructions on how to take a shower or bath while the port is accessed.  If your port does not need to stay accessed, no bandage is needed over the port.  What is flushing? Flushing helps keep the port from getting clogged. Follow your health care provider's instructions on how and when to flush the port. Ports are usually flushed with saline solution or a medicine called heparin. The need for flushing will depend on how the port is used.  If the port is used for intermittent medicines or blood draws, the port will need to be flushed: ? After medicines have been given. ? After blood has been drawn. ? As part of routine maintenance.  If a constant infusion is running, the port may not need to be flushed.  How long will my port stay implanted? The port can stay in for as long as your health care provider thinks it is needed. When it is time for the port to come out, surgery will be   done to remove it. The procedure is similar to the one performed when the port was put in. When should I seek immediate medical care? When you have an implanted port, you should seek immediate medical care if:  You notice a bad smell coming from the incision site.  You have swelling, redness, or drainage at the incision site.  You have more swelling or pain at the port site or the surrounding area.  You have a fever that is not controlled with medicine.  This information is not intended to replace advice given to you by your health care provider. Make sure you discuss any questions you have with your health care provider. Document  Released: 12/19/2004 Document Revised: 05/27/2015 Document Reviewed: 08/26/2012 Elsevier Interactive Patient Education  2017 Elsevier Inc.  

## 2016-03-23 NOTE — Progress Notes (Signed)
[place] OFFICE PROGRESS NOTE   Diagnosis:  Colon cancer  INTERVAL HISTORY:   [name] returns as scheduled. She completed cycle 1 FOLFIRI/Avastin 03/09/2016. She had a single episode of nausea earlier this week. No vomiting. No mouth sores. She developed a fever blister at the lower lip. This is resolving. She is having intermittent diarrhea. At the most 3-4 times a day. Imodium helps. Since having the frequent bowel movements hemorrhoids have "flared". She notes intermittent rectal bleeding related to the hemorrhoids. She is using Tucks pads with improvement in the hemorrhoids. No shortness of breath. No chest pain. No leg swelling or calf pain.  Objective:  Vital signs in last 24 hours:  Blood pressure (!) 149/97, pulse 73, temperature 99.1 F (37.3 C), resp. rate 18, height 5\' 5"  (1.651 m), weight 221 lb (100.2 kg), SpO2 100 %.    HEENT: Healing lesion at the mid lower lip. No thrush or ulcers within the oral cavity. Resp: Lungs clear bilaterally. Cardio: Regular rate and rhythm. GI: Abdomen soft and nontender. No hepatomegaly. She declined examination of the hemorrhoids. Vascular: No leg edema. Port-A-Cath without erythema.    Lab Results:  Lab Results  Component Value Date   WBC 3.9 03/23/2016   HGB 13.2 03/23/2016   HCT 39.2 03/23/2016   MCV 84.0 03/23/2016   PLT 198 03/23/2016   NEUTROABS 1.7 03/23/2016    Imaging:  No results found.  Medications: I have reviewed the patient's current medications.  Assessment/Plan: 1. 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  Cycle 2 CAPOX 01/22/2015  Cycle 3 CAPOX 02/12/2015  Cycle 4 CAPOX 03/05/2015  Cycle 5 CAPOX 03/26/2015  Cycle 6  CAPOX 04/16/2015  Cycle 7 CAPOX 05/07/2015 (oxaliplatin held due to progressive neuropathy)  Cycle 8 CAPOX 05/28/2015 (oxaliplatin held secondary to neuropathy)  CTs 11/01/2015-no significant change and an 11 mm upper abdomen mesenteric lymph node with partial calcification-treated metastasis?, No other evidence of metastatic disease  CTs 01/21/2016-mild interval progression of previously identified calcified nodal tissue in the upper central abdomen. Development of small adjacent lymph nodes in the interval.  EUS guided biopsy of the calcified upper abdominal mass on 02/17/2016 confirmed metastatic adenocarcinoma  Cycle 1 FOLFIRI/Avastin 03/09/2016  Cycle 2 FOLFIRI/Avastin 03/23/2016 2. History of Microcytic anemia-likely iron deficiency anemia secondary to #1 and ongoing menses 3. Status post cholecystectomy 4. Family history of breast and ovarian cancer. She has seen the genetics counselor. 5. Delayed nausea following cycle 1 CAPOX. Aloxi and Emend added beginning with cycle 2. 6. Transient episodes of dyspnea following cycle 4 and cycle 5 CAPOX, likely neurotoxicity from oxaliplatin 7. Oxaliplatin neuropathy-improved   Disposition: Brenda Becker appears stable. She has completed 1 cycle of FOLFIRI/Avastin. Plan to proceed with cycle 2 today as scheduled.  She is having intermittent loose stools. She notes improvement with Imodium. I also gave her a prescription for Lomotil if the diarrhea worsens. She understands to contact the office if the diarrhea is not controlled with either Imodium or Lomotil.  For the hemorrhoids she will continue use of the Tucks pads. She will call us or Dr. Morton Stall if the hemorrhoids are a persistent problem.  Blood pressure is elevated. We will repeat prior to Avastin.  She will return for a follow-up visit and cycle 3 FOLFIRI/Avastin in  2 weeks. She will contact the office in the interim as outlined above or with any other problems.  Plan reviewed with  Dr. [name]. 25 minutes were spent face-to-face at today's visit with the majority of that time involved in counseling/coordination of care.    Brenda Becker ANP/GNP-BC   03/23/2016  12:27 PM

## 2016-03-25 ENCOUNTER — Ambulatory Visit (HOSPITAL_BASED_OUTPATIENT_CLINIC_OR_DEPARTMENT_OTHER): Payer: PRIVATE HEALTH INSURANCE

## 2016-03-25 VITALS — BP 143/100 | HR 78 | Temp 98.3°F | Resp 18

## 2016-03-25 DIAGNOSIS — Z452 Encounter for adjustment and management of vascular access device: Secondary | ICD-10-CM

## 2016-03-25 DIAGNOSIS — C184 Malignant neoplasm of transverse colon: Secondary | ICD-10-CM

## 2016-03-25 MED ORDER — HEPARIN SOD (PORK) LOCK FLUSH 100 UNIT/ML IV SOLN
500.0000 [IU] | Freq: Once | INTRAVENOUS | Status: AC | PRN
  Administered 2016-03-25: 500 [IU]
  Filled 2016-03-25: qty 5

## 2016-03-25 MED ORDER — SODIUM CHLORIDE 0.9% FLUSH
10.0000 mL | INTRAVENOUS | Status: DC | PRN
  Administered 2016-03-25: 10 mL
  Filled 2016-03-25: qty 10

## 2016-03-27 ENCOUNTER — Telehealth: Payer: Self-pay | Admitting: *Deleted

## 2016-03-27 NOTE — Telephone Encounter (Signed)
Fax from [facility] stating they are still working on obtaining specimen from [facility].  Left message on voicemail for [facility] pathology to determine whether specimen has been sent for testing.

## 2016-03-30 NOTE — Telephone Encounter (Signed)
Left message on voicemail at [facility] pathology to follow up on specimen to be sent to [facility].

## 2016-04-02 ENCOUNTER — Other Ambulatory Visit: Payer: Self-pay | Admitting: Oncology

## 2016-04-03 ENCOUNTER — Encounter: Payer: Self-pay | Admitting: *Deleted

## 2016-04-03 NOTE — Progress Notes (Signed)
Fax received from [facility] to inform Dr. [name] that they are still working on obtaining the specimen for the [facility] testing Dr. [name] has requested for patient.

## 2016-04-06 ENCOUNTER — Other Ambulatory Visit (HOSPITAL_BASED_OUTPATIENT_CLINIC_OR_DEPARTMENT_OTHER): Payer: PRIVATE HEALTH INSURANCE

## 2016-04-06 ENCOUNTER — Ambulatory Visit: Payer: PRIVATE HEALTH INSURANCE

## 2016-04-06 ENCOUNTER — Telehealth: Payer: Self-pay | Admitting: *Deleted

## 2016-04-06 ENCOUNTER — Ambulatory Visit (HOSPITAL_BASED_OUTPATIENT_CLINIC_OR_DEPARTMENT_OTHER): Payer: PRIVATE HEALTH INSURANCE | Admitting: Oncology

## 2016-04-06 ENCOUNTER — Ambulatory Visit (HOSPITAL_BASED_OUTPATIENT_CLINIC_OR_DEPARTMENT_OTHER): Payer: PRIVATE HEALTH INSURANCE

## 2016-04-06 VITALS — BP 137/76 | HR 74 | Temp 98.2°F | Resp 19 | Ht 65.0 in | Wt 222.7 lb

## 2016-04-06 DIAGNOSIS — C779 Secondary and unspecified malignant neoplasm of lymph node, unspecified: Secondary | ICD-10-CM | POA: Diagnosis not present

## 2016-04-06 DIAGNOSIS — R200 Anesthesia of skin: Secondary | ICD-10-CM

## 2016-04-06 DIAGNOSIS — C184 Malignant neoplasm of transverse colon: Secondary | ICD-10-CM

## 2016-04-06 DIAGNOSIS — D701 Agranulocytosis secondary to cancer chemotherapy: Secondary | ICD-10-CM | POA: Diagnosis not present

## 2016-04-06 DIAGNOSIS — D509 Iron deficiency anemia, unspecified: Secondary | ICD-10-CM | POA: Diagnosis not present

## 2016-04-06 DIAGNOSIS — Z5112 Encounter for antineoplastic immunotherapy: Secondary | ICD-10-CM

## 2016-04-06 DIAGNOSIS — Z5111 Encounter for antineoplastic chemotherapy: Secondary | ICD-10-CM

## 2016-04-06 DIAGNOSIS — Z95828 Presence of other vascular implants and grafts: Secondary | ICD-10-CM

## 2016-04-06 LAB — CBC WITH DIFFERENTIAL/PLATELET
BASO%: 1 % (ref 0.0–2.0)
BASOS ABS: 0 10*3/uL (ref 0.0–0.1)
EOS%: 2.8 % (ref 0.0–7.0)
Eosinophils Absolute: 0.1 10*3/uL (ref 0.0–0.5)
HEMATOCRIT: 37.1 % (ref 34.8–46.6)
HGB: 12.9 g/dL (ref 11.6–15.9)
LYMPH%: 48.9 % (ref 14.0–49.7)
MCH: 29.1 pg (ref 25.1–34.0)
MCHC: 34.8 g/dL (ref 31.5–36.0)
MCV: 83.5 fL (ref 79.5–101.0)
MONO#: 0.3 10*3/uL (ref 0.1–0.9)
MONO%: 9.1 % (ref 0.0–14.0)
NEUT#: 1.4 10*3/uL — ABNORMAL LOW (ref 1.5–6.5)
NEUT%: 38.2 % — AB (ref 38.4–76.8)
Platelets: 201 10*3/uL (ref 145–400)
RBC: 4.44 10*6/uL (ref 3.70–5.45)
RDW: 14.7 % — ABNORMAL HIGH (ref 11.2–14.5)
WBC: 3.6 10*3/uL — ABNORMAL LOW (ref 3.9–10.3)
lymph#: 1.8 10*3/uL (ref 0.9–3.3)

## 2016-04-06 LAB — COMPREHENSIVE METABOLIC PANEL
ALT: 38 U/L (ref 0–55)
AST: 22 U/L (ref 5–34)
Albumin: 3.5 g/dL (ref 3.5–5.0)
Alkaline Phosphatase: 91 U/L (ref 40–150)
Anion Gap: 9 mEq/L (ref 3–11)
BUN: 8.4 mg/dL (ref 7.0–26.0)
CALCIUM: 9.1 mg/dL (ref 8.4–10.4)
CHLORIDE: 111 meq/L — AB (ref 98–109)
CO2: 23 meq/L (ref 22–29)
Creatinine: 0.8 mg/dL (ref 0.6–1.1)
EGFR: 88 mL/min/{1.73_m2} — ABNORMAL LOW (ref >90)
Glucose: 115 mg/dl (ref 70–140)
POTASSIUM: 3.7 meq/L (ref 3.5–5.1)
Sodium: 142 mEq/L (ref 136–145)
Total Bilirubin: 0.47 mg/dL (ref 0.20–1.20)
Total Protein: 6.9 g/dL (ref 6.4–8.3)

## 2016-04-06 LAB — UA PROTEIN, DIPSTICK - CHCC: PROTEIN: NEGATIVE mg/dL

## 2016-04-06 MED ORDER — ATROPINE SULFATE 1 MG/ML IJ SOLN
INTRAMUSCULAR | Status: AC
  Filled 2016-04-06: qty 1

## 2016-04-06 MED ORDER — PALONOSETRON HCL INJECTION 0.25 MG/5ML
0.2500 mg | Freq: Once | INTRAVENOUS | Status: AC
  Administered 2016-04-06: 0.25 mg via INTRAVENOUS

## 2016-04-06 MED ORDER — PALONOSETRON HCL INJECTION 0.25 MG/5ML
INTRAVENOUS | Status: AC
  Filled 2016-04-06: qty 5

## 2016-04-06 MED ORDER — FLUOROURACIL CHEMO INJECTION 2.5 GM/50ML
400.0000 mg/m2 | Freq: Once | INTRAVENOUS | Status: AC
  Administered 2016-04-06: 850 mg via INTRAVENOUS
  Filled 2016-04-06: qty 17

## 2016-04-06 MED ORDER — SODIUM CHLORIDE 0.9 % IV SOLN
5.0000 mg/kg | Freq: Once | INTRAVENOUS | Status: AC
  Administered 2016-04-06: 500 mg via INTRAVENOUS
  Filled 2016-04-06: qty 16

## 2016-04-06 MED ORDER — SODIUM CHLORIDE 0.9 % IV SOLN
Freq: Once | INTRAVENOUS | Status: AC
  Administered 2016-04-06: 09:00:00 via INTRAVENOUS

## 2016-04-06 MED ORDER — DEXAMETHASONE SODIUM PHOSPHATE 10 MG/ML IJ SOLN
INTRAMUSCULAR | Status: AC
  Filled 2016-04-06: qty 1

## 2016-04-06 MED ORDER — IRINOTECAN HCL CHEMO INJECTION 100 MG/5ML
180.0000 mg/m2 | Freq: Once | INTRAVENOUS | Status: AC
  Administered 2016-04-06: 380 mg via INTRAVENOUS
  Filled 2016-04-06: qty 15

## 2016-04-06 MED ORDER — ATROPINE SULFATE 1 MG/ML IJ SOLN
0.5000 mg | Freq: Once | INTRAMUSCULAR | Status: AC | PRN
  Administered 2016-04-06: 0.5 mg via INTRAVENOUS

## 2016-04-06 MED ORDER — SODIUM CHLORIDE 0.9 % IV SOLN
2325.0000 mg/m2 | INTRAVENOUS | Status: DC
  Administered 2016-04-06: 5000 mg via INTRAVENOUS
  Filled 2016-04-06: qty 100

## 2016-04-06 MED ORDER — LEUCOVORIN CALCIUM INJECTION 350 MG
400.0000 mg/m2 | Freq: Once | INTRAVENOUS | Status: AC
  Administered 2016-04-06: 860 mg via INTRAVENOUS
  Filled 2016-04-06: qty 43

## 2016-04-06 MED ORDER — DEXAMETHASONE SODIUM PHOSPHATE 10 MG/ML IJ SOLN
10.0000 mg | Freq: Once | INTRAMUSCULAR | Status: AC
  Administered 2016-04-06: 10 mg via INTRAVENOUS

## 2016-04-06 MED ORDER — HEPARIN SOD (PORK) LOCK FLUSH 100 UNIT/ML IV SOLN
500.0000 [IU] | Freq: Once | INTRAVENOUS | Status: DC | PRN
  Filled 2016-04-06: qty 5

## 2016-04-06 MED ORDER — SODIUM CHLORIDE 0.9 % IJ SOLN
10.0000 mL | INTRAMUSCULAR | Status: DC | PRN
  Administered 2016-04-06: 10 mL via INTRAVENOUS
  Filled 2016-04-06: qty 10

## 2016-04-06 MED ORDER — SODIUM CHLORIDE 0.9% FLUSH
10.0000 mL | INTRAVENOUS | Status: DC | PRN
  Administered 2016-04-06: 10 mL
  Filled 2016-04-06: qty 10

## 2016-04-06 NOTE — Progress Notes (Signed)
  [place] OFFICE PROGRESS NOTE   Diagnosis:  Colon cancer  INTERVAL HISTORY:   Brenda Becker returns as scheduled. She completed cycle 2 FOLFIRI/Avastin 03/23/2016. No nausea/vomiting, mouth sores, or diarrhea following chemotherapy. She is working. She is walking 45 minutes each day. Good appetite. She reports "tingling" at the soles of the feet. She thinks this could be related to walking on a treadmill.  Objective:  Vital signs in last 24 hours:  Blood pressure 137/76, pulse 74, temperature 98.2 F (36.8 C), temperature source Oral, resp. rate 19, height 5\' 5"  (1.651 m), weight 222 lb 11.2 oz (101 kg), SpO2 100 %.    HEENT: No thrush or ulcers Resp: Lungs clear bilaterally Cardio: Regular rate and rhythm GI: No hepatosplenomegaly, nontender Vascular: No leg edema  Skin: Palms without erythema   Portacath/PICC-without erythema  Lab Results:  Lab Results  Component Value Date   WBC 3.6 (L) 04/06/2016   HGB 12.9 04/06/2016   HCT 37.1 04/06/2016   MCV 83.5 04/06/2016   PLT 201 04/06/2016   NEUTROABS 1.4 (L) 04/06/2016     Medications: I have reviewed the patient's current medications.  Assessment/Plan: 1. 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  Cycle 2 CAPOX 01/22/2015  Cycle 3 CAPOX 02/12/2015  Cycle 4 CAPOX 03/05/2015  Cycle 5 CAPOX 03/26/2015  Cycle 6 CAPOX 04/16/2015  Cycle 7 CAPOX 05/07/2015 (oxaliplatin held due to progressive neuropathy)  Cycle 8 CAPOX 05/28/2015 (oxaliplatin held secondary to neuropathy)  CTs 11/01/2015-no significant change and an 11 mm upper abdomen mesenteric lymph node with partial calcification-treated metastasis?, No other evidence of metastatic  disease  CTs 01/21/2016-mild interval progression of previously identified calcified nodal tissue in the upper central abdomen. Development of small adjacent lymph nodes in the interval.  EUS guided biopsy of the calcified upper abdominal mass on 02/17/2016 confirmed metastatic adenocarcinoma  Cycle 1 FOLFIRI/Avastin 03/09/2016  Cycle 2 FOLFIRI/Avastin 03/23/2016  Cycle 3 FOLFIRI/Avastin 04/06/2016 (Neulasta added) 2. History of Microcytic anemia-likely iron deficiency anemia secondary to #1 and ongoing menses 3. Status post cholecystectomy 4. Family history of breast and ovarian cancer. She has seen the genetics counselor. 5. Delayed nausea following cycle 1 CAPOX. Aloxi and Emend added beginning with cycle 2. 6. Transient episodes of dyspnea following cycle 4 and cycle 5 CAPOX, likely neurotoxicity from oxaliplatin 7. Oxaliplatin neuropathy-improved 8. Mild neutropenia secondary to chemotherapy-Neulasta added beginning with cycle 3 FOLFIRI/Avastin     Disposition:  She is tolerating the chemotherapy well. The plan is to proceed with cycle 3 FOLFIRI/Avastin today. Brenda Becker has developed mild neutropenia secondary to chemotherapy. We discussed a delay of this cycle versus adding Neulasta. She understands the potential risks for infection and would like to proceed with chemotherapy today. Neulasta will be added on day 3. I reviewed the potential toxicities associated with Neulasta and she agrees to proceed.  Brenda Becker will return for an office visit and chemotherapy in 2 weeks. The plan is to schedule a restaging evaluation after 5 cycles of FOLFIRI/Avastin.  Betsy Coder, MD  04/06/2016  8:57 AM

## 2016-04-06 NOTE — Telephone Encounter (Signed)
Call from [place] with [facility] Pathology: they are awaiting shipping kits from [facility] to send pt's specimen. Kristeen Miss thinks it should go out in the next few days.

## 2016-04-06 NOTE — Patient Instructions (Signed)
[facility] Discharge Instructions for Patients Receiving Chemotherapy  Today you received the following chemotherapy agents: Avastin, Irinotecan, Leucovorin and Adrucil  To help prevent nausea and vomiting after your treatment, we encourage you to take your nausea medication as directed.    If you develop nausea and vomiting that is not controlled by your nausea medication, call the clinic.   BELOW ARE SYMPTOMS THAT SHOULD BE REPORTED IMMEDIATELY:  *FEVER GREATER THAN 100.5 F  *CHILLS WITH OR WITHOUT FEVER  NAUSEA AND VOMITING THAT IS NOT CONTROLLED WITH YOUR NAUSEA MEDICATION  *UNUSUAL SHORTNESS OF BREATH  *UNUSUAL BRUISING OR BLEEDING  TENDERNESS IN MOUTH AND THROAT WITH OR WITHOUT PRESENCE OF ULCERS  *URINARY PROBLEMS  *BOWEL PROBLEMS  UNUSUAL RASH Items with * indicate a potential emergency and should be followed up as soon as possible.  Feel free to call the clinic you have any questions or concerns. The clinic phone number is [phone].  Please show the [facility] at check-in to the Emergency Department and triage nurse.   Neutropenia Neutropenia is a condition that occurs when you have a lower-than-normal level of a type of white blood cell (neutrophil) in your body. Neutrophils are made in the spongy center of large bones (bone marrow) and they fight infections. Neutrophils are your body's main defense against bacterial and fungal infections. The fewer neutrophils you have and the longer your body remains without them, the greater your risk of getting a severe infection. What are the causes? This condition can occur if your body uses up or destroys neutrophils faster than your bone marrow can make them. This problem may happen because of:  Bacterial or fungal infection.  Allergic disorders.  Reactions to some medicines.  Autoimmune disease.  An enlarged spleen. This condition can also occur if your bone marrow does not produce enough  neutrophils. This problem may be caused by:  Cancer.  Cancer treatments, such as radiation or chemotherapy.  Viral infections.  Medicines, such as phenytoin.  Vitamin B12 deficiency.  Diseases of the bone marrow.  Environmental toxins, such as insecticides. What are the signs or symptoms? This condition does not usually cause symptoms. If symptoms are present, they are usually caused by an underlying infection. Symptoms of an infection may include:  Fever.  Chills.  Swollen glands.  Oral or anal ulcers.  Cough and shortness of breath.  Rash.  Skin infection.  Fatigue. How is this diagnosed? Your health care provider may suspect neutropenia if you have:  A condition that may cause neutropenia.  Symptoms of infection, especially fever.  Frequent and unusual infections. You will have a medical history and physical exam. Tests will also be done, such as:  A complete blood count (CBC).  A procedure to collect a sample of bone marrow for examination (bone marrow biopsy).  A chest X-ray.  A urine culture.  A blood culture. How is this treated? Treatment depends on the underlying cause and severity of your condition. Mild neutropenia may not require treatment. Treatment may include medicines, such as:  Antibiotic medicine given through an IV tube.  Antiviral medicines.  Antifungal medicines.  A medicine to increase neutrophil production (colony-stimulating factor). You may get this drug through an IV tube or by injection.  Steroids given through an IV tube. If an underlying condition is causing neutropenia, you may need treatment for that condition. If medicines you are taking are causing neutropenia, your health care provider may have you stop taking those medicines. Follow  these instructions at home: Medicines   Take over-the-counter and prescription medicines only as told by your health care provider.  Get a seasonal flu shot (influenza  vaccine). Lifestyle   Do not eat unpasteurized foods.Do not eat unwashed raw fruits or vegetables.  Avoid exposure to groups of people or children.  Avoid being around people who are sick.  Avoid being around dirt or dust, such as in construction areas or gardens.  Do not provide direct care for pets. Avoid animal droppings. Do not clean litter boxes and bird cages. Hygiene    Bathe daily.  Clean the area between the genitals and the anus (perineal area) after you urinate or have a bowel movement. If you are female, wipe from front to back.  Brush your teeth with a soft toothbrush before and after meals.  Do not use a razor that has a blade. Use an electric razor to remove hair.  Wash your hands often. Make sure others who come in contact with you also wash their hands. If soap and water are not available, use hand sanitizer. General instructions   Do not have sex unless your health care provider has approved.  Take actions to avoid cuts and burns. For example:  Be cautious when you use knives. Always cut away from yourself.  Keep knives in protective sheaths or guards when not in use.  Use oven mitts when you cook with a hot stove, oven, or grill.  Stand a safe distance away from open fires.  Avoid people who received a vaccine in the past 30 days if that vaccine contained a live version of the germ (live vaccine). You should not get a live vaccine. Common live vaccines are varicella, measles, mumps, and rubella.  Do not share food utensils.  Do not use tampons, enemas, or rectal suppositories unless your health care provider has approved.  Keep all appointments as told by your health care provider. This is important. Contact a health care provider if:  You have a fever.  You have chills or you start to shake.  You have:  A sore throat.  A warm, red, or tender area on your skin.  A cough.  Frequent or painful urination.  Vaginal discharge or  itching.  You develop:  Sores in your mouth or anus.  Swollen lymph nodes.  Red streaks on the skin.  A rash.  You feel:  Nauseous or you vomit.  Very fatigued.  Short of breath. This information is not intended to replace advice given to you by your health care provider. Make sure you discuss any questions you have with your health care provider. Document Released: 06/10/2001 Document Revised: 05/27/2015 Document Reviewed: 07/01/2014 Elsevier Interactive Patient Education  2017 [employer].

## 2016-04-06 NOTE — Patient Instructions (Signed)
Implanted Port Home Guide An implanted port is a type of central line that is placed under the skin. Central lines are used to provide IV access when treatment or nutrition needs to be given through a person's veins. Implanted ports are used for long-term IV access. An implanted port may be placed because:  You need IV medicine that would be irritating to the small veins in your hands or arms.  You need long-term IV medicines, such as antibiotics.  You need IV nutrition for a long period.  You need frequent blood draws for lab tests.  You need dialysis.  Implanted ports are usually placed in the chest area, but they can also be placed in the upper arm, the abdomen, or the leg. An implanted port has two main parts:  Reservoir. The reservoir is round and will appear as a small, raised area under your skin. The reservoir is the part where a needle is inserted to give medicines or draw blood.  Catheter. The catheter is a thin, flexible tube that extends from the reservoir. The catheter is placed into a large vein. Medicine that is inserted into the reservoir goes into the catheter and then into the vein.  How will I care for my incision site? Do not get the incision site wet. Bathe or shower as directed by your health care provider. How is my port accessed? Special steps must be taken to access the port:  Before the port is accessed, a numbing cream can be placed on the skin. This helps numb the skin over the port site.  Your health care provider uses a sterile technique to access the port. ? Your health care provider must put on a mask and sterile gloves. ? The skin over your port is cleaned carefully with an antiseptic and allowed to dry. ? The port is gently pinched between sterile gloves, and a needle is inserted into the port.  Only "non-coring" port needles should be used to access the port. Once the port is accessed, a blood return should be checked. This helps ensure that the port  is in the vein and is not clogged.  If your port needs to remain accessed for a constant infusion, a clear (transparent) bandage will be placed over the needle site. The bandage and needle will need to be changed every week, or as directed by your health care provider.  Keep the bandage covering the needle clean and dry. Do not get it wet. Follow your health care provider's instructions on how to take a shower or bath while the port is accessed.  If your port does not need to stay accessed, no bandage is needed over the port.  What is flushing? Flushing helps keep the port from getting clogged. Follow your health care provider's instructions on how and when to flush the port. Ports are usually flushed with saline solution or a medicine called heparin. The need for flushing will depend on how the port is used.  If the port is used for intermittent medicines or blood draws, the port will need to be flushed: ? After medicines have been given. ? After blood has been drawn. ? As part of routine maintenance.  If a constant infusion is running, the port may not need to be flushed.  How long will my port stay implanted? The port can stay in for as long as your health care provider thinks it is needed. When it is time for the port to come out, surgery will be   done to remove it. The procedure is similar to the one performed when the port was put in. When should I seek immediate medical care? When you have an implanted port, you should seek immediate medical care if:  You notice a bad smell coming from the incision site.  You have swelling, redness, or drainage at the incision site.  You have more swelling or pain at the port site or the surrounding area.  You have a fever that is not controlled with medicine.  This information is not intended to replace advice given to you by your health care provider. Make sure you discuss any questions you have with your health care provider. Document  Released: 12/19/2004 Document Revised: 05/27/2015 Document Reviewed: 08/26/2012 Elsevier Interactive Patient Education  2017 Elsevier Inc.  

## 2016-04-07 ENCOUNTER — Telehealth: Payer: Self-pay | Admitting: *Deleted

## 2016-04-07 NOTE — Telephone Encounter (Signed)
Call received from [facility] with [facility] to inform Dr. [name] that [facility] request has been sent out today.  Dr. [name] notified.

## 2016-04-08 ENCOUNTER — Ambulatory Visit (HOSPITAL_BASED_OUTPATIENT_CLINIC_OR_DEPARTMENT_OTHER): Payer: PRIVATE HEALTH INSURANCE

## 2016-04-08 VITALS — BP 149/95 | HR 63 | Temp 98.5°F | Resp 20

## 2016-04-08 DIAGNOSIS — C184 Malignant neoplasm of transverse colon: Secondary | ICD-10-CM

## 2016-04-08 DIAGNOSIS — D701 Agranulocytosis secondary to cancer chemotherapy: Secondary | ICD-10-CM

## 2016-04-08 MED ORDER — PEGFILGRASTIM INJECTION 6 MG/0.6ML ~~LOC~~
6.0000 mg | PREFILLED_SYRINGE | Freq: Once | SUBCUTANEOUS | Status: AC
  Administered 2016-04-08: 6 mg via SUBCUTANEOUS

## 2016-04-08 MED ORDER — SODIUM CHLORIDE 0.9% FLUSH
10.0000 mL | INTRAVENOUS | Status: DC | PRN
Start: 2016-04-08 — End: 2016-04-08
  Administered 2016-04-08: 10 mL
  Filled 2016-04-08: qty 10

## 2016-04-08 MED ORDER — HEPARIN SOD (PORK) LOCK FLUSH 100 UNIT/ML IV SOLN
500.0000 [IU] | Freq: Once | INTRAVENOUS | Status: AC | PRN
  Administered 2016-04-08: 500 [IU]
  Filled 2016-04-08: qty 5

## 2016-04-08 NOTE — Patient Instructions (Signed)
Pegfilgrastim injection What is this medicine? PEGFILGRASTIM (PEG fil gra stim) is a long-acting granulocyte colony-stimulating factor that stimulates the growth of neutrophils, a type of white blood cell important in the body's fight against infection. It is used to reduce the incidence of fever and infection in patients with certain types of cancer who are receiving chemotherapy that affects the bone marrow, and to increase survival after being exposed to high doses of radiation. This medicine may be used for other purposes; ask your health care provider or pharmacist if you have questions. COMMON BRAND NAME(S): Neulasta What should I tell my health care provider before I take this medicine? They need to know if you have any of these conditions: -kidney disease -latex allergy -ongoing radiation therapy -sickle cell disease -skin reactions to acrylic adhesives (On-Body Injector only) -an unusual or allergic reaction to pegfilgrastim, filgrastim, other medicines, foods, dyes, or preservatives -pregnant or trying to get pregnant -breast-feeding How should I use this medicine? This medicine is for injection under the skin. If you get this medicine at home, you will be taught how to prepare and give the pre-filled syringe or how to use the On-body Injector. Refer to the patient Instructions for Use for detailed instructions. Use exactly as directed. Tell your healthcare provider immediately if you suspect that the On-body Injector may not have performed as intended or if you suspect the use of the On-body Injector resulted in a missed or partial dose. It is important that you put your used needles and syringes in a special sharps container. Do not put them in a trash can. If you do not have a sharps container, call your pharmacist or healthcare provider to get one. Talk to your pediatrician regarding the use of this medicine in children. While this drug may be prescribed for selected conditions,  precautions do apply. Overdosage: If you think you have taken too much of this medicine contact a poison control center or emergency room at once. NOTE: This medicine is only for you. Do not share this medicine with others. What if I miss a dose? It is important not to miss your dose. Call your doctor or health care professional if you miss your dose. If you miss a dose due to an On-body Injector failure or leakage, a new dose should be administered as soon as possible using a single prefilled syringe for manual use. What may interact with this medicine? Interactions have not been studied. Give your health care provider a list of all the medicines, herbs, non-prescription drugs, or dietary supplements you use. Also tell them if you smoke, drink alcohol, or use illegal drugs. Some items may interact with your medicine. This list may not describe all possible interactions. Give your health care provider a list of all the medicines, herbs, non-prescription drugs, or dietary supplements you use. Also tell them if you smoke, drink alcohol, or use illegal drugs. Some items may interact with your medicine. What should I watch for while using this medicine? You may need blood work done while you are taking this medicine. If you are going to need a MRI, CT scan, or other procedure, tell your doctor that you are using this medicine (On-Body Injector only). What side effects may I notice from receiving this medicine? Side effects that you should report to your doctor or health care professional as soon as possible: -allergic reactions like skin rash, itching or hives, swelling of the face, lips, or tongue -dizziness -fever -pain, redness, or irritation at site   where injected -pinpoint red spots on the skin -red or dark-brown urine -shortness of breath or breathing problems -stomach or side pain, or pain at the shoulder -swelling -tiredness -trouble passing urine or change in the amount of urine Side  effects that usually do not require medical attention (report to your doctor or health care professional if they continue or are bothersome): -bone pain -muscle pain This list may not describe all possible side effects. Call your doctor for medical advice about side effects. You may report side effects to FDA at 1-800-FDA-1088. Where should I keep my medicine? Keep out of the reach of children. Store pre-filled syringes in a refrigerator between 2 and 8 degrees C (36 and 46 degrees F). Do not freeze. Keep in carton to protect from light. Throw away this medicine if it is left out of the refrigerator for more than 48 hours. Throw away any unused medicine after the expiration date. NOTE: This sheet is a summary. It may not cover all possible information. If you have questions about this medicine, talk to your doctor, pharmacist, or health care provider.  2018 Elsevier/Gold Standard (2015-12-16 12:58:03)  

## 2016-04-10 ENCOUNTER — Telehealth: Payer: Self-pay | Admitting: Oncology

## 2016-04-10 ENCOUNTER — Ambulatory Visit: Payer: PRIVATE HEALTH INSURANCE | Admitting: Nurse Practitioner

## 2016-04-10 NOTE — Telephone Encounter (Signed)
Received call from Operator Caryl Pina) regarding scheduling chemo appointment for patient. Returned patient call. Appointments was scheduled per 04/06/16 los. Appointments confirmed with patient. 04/10/16

## 2016-04-16 ENCOUNTER — Other Ambulatory Visit: Payer: Self-pay | Admitting: Oncology

## 2016-04-20 ENCOUNTER — Other Ambulatory Visit (HOSPITAL_BASED_OUTPATIENT_CLINIC_OR_DEPARTMENT_OTHER): Payer: PRIVATE HEALTH INSURANCE

## 2016-04-20 ENCOUNTER — Ambulatory Visit (HOSPITAL_BASED_OUTPATIENT_CLINIC_OR_DEPARTMENT_OTHER): Payer: PRIVATE HEALTH INSURANCE

## 2016-04-20 ENCOUNTER — Telehealth: Payer: Self-pay | Admitting: Nurse Practitioner

## 2016-04-20 ENCOUNTER — Ambulatory Visit: Payer: PRIVATE HEALTH INSURANCE

## 2016-04-20 ENCOUNTER — Ambulatory Visit (HOSPITAL_BASED_OUTPATIENT_CLINIC_OR_DEPARTMENT_OTHER): Payer: PRIVATE HEALTH INSURANCE | Admitting: Nurse Practitioner

## 2016-04-20 VITALS — BP 132/80 | HR 76

## 2016-04-20 VITALS — BP 149/91 | HR 85 | Temp 98.6°F | Resp 18

## 2016-04-20 DIAGNOSIS — Z5111 Encounter for antineoplastic chemotherapy: Secondary | ICD-10-CM

## 2016-04-20 DIAGNOSIS — C184 Malignant neoplasm of transverse colon: Secondary | ICD-10-CM

## 2016-04-20 DIAGNOSIS — C779 Secondary and unspecified malignant neoplasm of lymph node, unspecified: Secondary | ICD-10-CM | POA: Diagnosis not present

## 2016-04-20 DIAGNOSIS — D509 Iron deficiency anemia, unspecified: Secondary | ICD-10-CM | POA: Diagnosis not present

## 2016-04-20 DIAGNOSIS — Z5112 Encounter for antineoplastic immunotherapy: Secondary | ICD-10-CM | POA: Diagnosis not present

## 2016-04-20 DIAGNOSIS — D701 Agranulocytosis secondary to cancer chemotherapy: Secondary | ICD-10-CM | POA: Diagnosis not present

## 2016-04-20 DIAGNOSIS — Z95828 Presence of other vascular implants and grafts: Secondary | ICD-10-CM

## 2016-04-20 LAB — COMPREHENSIVE METABOLIC PANEL
ALK PHOS: 87 U/L (ref 40–150)
ALT: 48 U/L (ref 0–55)
AST: 35 U/L — AB (ref 5–34)
Albumin: 3.7 g/dL (ref 3.5–5.0)
Anion Gap: 11 mEq/L (ref 3–11)
BUN: 11.7 mg/dL (ref 7.0–26.0)
CALCIUM: 9.4 mg/dL (ref 8.4–10.4)
CO2: 23 mEq/L (ref 22–29)
CREATININE: 0.9 mg/dL (ref 0.6–1.1)
Chloride: 109 mEq/L (ref 98–109)
EGFR: 80 mL/min/{1.73_m2} — ABNORMAL LOW (ref >90)
Glucose: 115 mg/dl (ref 70–140)
Potassium: 4.2 mEq/L (ref 3.5–5.1)
SODIUM: 143 meq/L (ref 136–145)
Total Bilirubin: 0.47 mg/dL (ref 0.20–1.20)
Total Protein: 7.1 g/dL (ref 6.4–8.3)

## 2016-04-20 LAB — CBC WITH DIFFERENTIAL/PLATELET
BASO%: 1.2 % (ref 0.0–2.0)
BASOS ABS: 0.1 10*3/uL (ref 0.0–0.1)
EOS%: 2.4 % (ref 0.0–7.0)
Eosinophils Absolute: 0.2 10*3/uL (ref 0.0–0.5)
HCT: 40.1 % (ref 34.8–46.6)
HGB: 13.6 g/dL (ref 11.6–15.9)
LYMPH%: 34.6 % (ref 14.0–49.7)
MCH: 28.6 pg (ref 25.1–34.0)
MCHC: 33.9 g/dL (ref 31.5–36.0)
MCV: 84.5 fL (ref 79.5–101.0)
MONO#: 0.4 10*3/uL (ref 0.1–0.9)
MONO%: 5.9 % (ref 0.0–14.0)
NEUT#: 3.7 10*3/uL (ref 1.5–6.5)
NEUT%: 55.9 % (ref 38.4–76.8)
Platelets: 164 10*3/uL (ref 145–400)
RBC: 4.75 10*6/uL (ref 3.70–5.45)
RDW: 15.7 % — ABNORMAL HIGH (ref 11.2–14.5)
WBC: 6.6 10*3/uL (ref 3.9–10.3)
lymph#: 2.3 10*3/uL (ref 0.9–3.3)

## 2016-04-20 LAB — UA PROTEIN, DIPSTICK - CHCC: PROTEIN: NEGATIVE mg/dL

## 2016-04-20 MED ORDER — IRINOTECAN HCL CHEMO INJECTION 100 MG/5ML
180.0000 mg/m2 | Freq: Once | INTRAVENOUS | Status: AC
  Administered 2016-04-20: 380 mg via INTRAVENOUS
  Filled 2016-04-20: qty 15

## 2016-04-20 MED ORDER — FLUOROURACIL CHEMO INJECTION 2.5 GM/50ML
400.0000 mg/m2 | Freq: Once | INTRAVENOUS | Status: AC
  Administered 2016-04-20: 850 mg via INTRAVENOUS
  Filled 2016-04-20: qty 17

## 2016-04-20 MED ORDER — DEXAMETHASONE SODIUM PHOSPHATE 10 MG/ML IJ SOLN
10.0000 mg | Freq: Once | INTRAMUSCULAR | Status: AC
  Administered 2016-04-20: 10 mg via INTRAVENOUS

## 2016-04-20 MED ORDER — DEXTROSE 5 % IV SOLN
400.0000 mg/m2 | Freq: Once | INTRAVENOUS | Status: AC
  Administered 2016-04-20: 860 mg via INTRAVENOUS
  Filled 2016-04-20: qty 43

## 2016-04-20 MED ORDER — FLUOROURACIL CHEMO INJECTION 5 GM/100ML
2325.0000 mg/m2 | INTRAVENOUS | Status: DC
  Administered 2016-04-20: 5000 mg via INTRAVENOUS
  Filled 2016-04-20: qty 100

## 2016-04-20 MED ORDER — PALONOSETRON HCL INJECTION 0.25 MG/5ML
0.2500 mg | Freq: Once | INTRAVENOUS | Status: AC
  Administered 2016-04-20: 0.25 mg via INTRAVENOUS

## 2016-04-20 MED ORDER — ALTEPLASE 2 MG IJ SOLR
2.0000 mg | Freq: Once | INTRAMUSCULAR | Status: DC | PRN
  Filled 2016-04-20: qty 2

## 2016-04-20 MED ORDER — SODIUM CHLORIDE 0.9 % IV SOLN
Freq: Once | INTRAVENOUS | Status: AC
  Administered 2016-04-20: 11:00:00 via INTRAVENOUS

## 2016-04-20 MED ORDER — SODIUM CHLORIDE 0.9 % IJ SOLN
10.0000 mL | INTRAMUSCULAR | Status: DC | PRN
  Administered 2016-04-20: 10 mL via INTRAVENOUS
  Filled 2016-04-20: qty 10

## 2016-04-20 MED ORDER — ATROPINE SULFATE 1 MG/ML IJ SOLN
0.5000 mg | Freq: Once | INTRAMUSCULAR | Status: AC | PRN
  Administered 2016-04-20: 0.5 mg via INTRAVENOUS

## 2016-04-20 MED ORDER — PALONOSETRON HCL INJECTION 0.25 MG/5ML
INTRAVENOUS | Status: AC
  Filled 2016-04-20: qty 5

## 2016-04-20 MED ORDER — SODIUM CHLORIDE 0.9 % IV SOLN
5.0000 mg/kg | Freq: Once | INTRAVENOUS | Status: AC
  Administered 2016-04-20: 500 mg via INTRAVENOUS
  Filled 2016-04-20: qty 16

## 2016-04-20 MED ORDER — ATROPINE SULFATE 1 MG/ML IJ SOLN
INTRAMUSCULAR | Status: AC
  Filled 2016-04-20: qty 1

## 2016-04-20 MED ORDER — DEXAMETHASONE SODIUM PHOSPHATE 10 MG/ML IJ SOLN
INTRAMUSCULAR | Status: AC
  Filled 2016-04-20: qty 1

## 2016-04-20 NOTE — Patient Instructions (Signed)
[facility] Discharge Instructions for Patients Receiving Chemotherapy  Today you received the following chemotherapy agents: Avastin, Camptosar, Leucovorin and Fluorouracil. To help prevent nausea and vomiting after your treatment, we encourage you to take your nausea medication: Compazine. Take one every 6 hours as needed.  If you develop nausea and vomiting that is not controlled by your nausea medication, call the clinic.   BELOW ARE SYMPTOMS THAT SHOULD BE REPORTED IMMEDIATELY:  *FEVER GREATER THAN 100.5 F  *CHILLS WITH OR WITHOUT FEVER  NAUSEA AND VOMITING THAT IS NOT CONTROLLED WITH YOUR NAUSEA MEDICATION  *UNUSUAL SHORTNESS OF BREATH  *UNUSUAL BRUISING OR BLEEDING  TENDERNESS IN MOUTH AND THROAT WITH OR WITHOUT PRESENCE OF ULCERS  *URINARY PROBLEMS  *BOWEL PROBLEMS  UNUSUAL RASH Items with * indicate a potential emergency and should be followed up as soon as possible.  Feel free to call the clinic should you have any questions or concerns. The clinic phone number is [phone].  Please show the [place] at check-in to the Emergency Department and triage nurse.

## 2016-04-20 NOTE — Patient Instructions (Signed)
Implanted Port Home Guide An implanted port is a type of central line that is placed under the skin. Central lines are used to provide IV access when treatment or nutrition needs to be given through a person's veins. Implanted ports are used for long-term IV access. An implanted port may be placed because:  You need IV medicine that would be irritating to the small veins in your hands or arms.  You need long-term IV medicines, such as antibiotics.  You need IV nutrition for a long period.  You need frequent blood draws for lab tests.  You need dialysis.  Implanted ports are usually placed in the chest area, but they can also be placed in the upper arm, the abdomen, or the leg. An implanted port has two main parts:  Reservoir. The reservoir is round and will appear as a small, raised area under your skin. The reservoir is the part where a needle is inserted to give medicines or draw blood.  Catheter. The catheter is a thin, flexible tube that extends from the reservoir. The catheter is placed into a large vein. Medicine that is inserted into the reservoir goes into the catheter and then into the vein.  How will I care for my incision site? Do not get the incision site wet. Bathe or shower as directed by your health care provider. How is my port accessed? Special steps must be taken to access the port:  Before the port is accessed, a numbing cream can be placed on the skin. This helps numb the skin over the port site.  Your health care provider uses a sterile technique to access the port. ? Your health care provider must put on a mask and sterile gloves. ? The skin over your port is cleaned carefully with an antiseptic and allowed to dry. ? The port is gently pinched between sterile gloves, and a needle is inserted into the port.  Only "non-coring" port needles should be used to access the port. Once the port is accessed, a blood return should be checked. This helps ensure that the port  is in the vein and is not clogged.  If your port needs to remain accessed for a constant infusion, a clear (transparent) bandage will be placed over the needle site. The bandage and needle will need to be changed every week, or as directed by your health care provider.  Keep the bandage covering the needle clean and dry. Do not get it wet. Follow your health care provider's instructions on how to take a shower or bath while the port is accessed.  If your port does not need to stay accessed, no bandage is needed over the port.  What is flushing? Flushing helps keep the port from getting clogged. Follow your health care provider's instructions on how and when to flush the port. Ports are usually flushed with saline solution or a medicine called heparin. The need for flushing will depend on how the port is used.  If the port is used for intermittent medicines or blood draws, the port will need to be flushed: ? After medicines have been given. ? After blood has been drawn. ? As part of routine maintenance.  If a constant infusion is running, the port may not need to be flushed.  How long will my port stay implanted? The port can stay in for as long as your health care provider thinks it is needed. When it is time for the port to come out, surgery will be   done to remove it. The procedure is similar to the one performed when the port was put in. When should I seek immediate medical care? When you have an implanted port, you should seek immediate medical care if:  You notice a bad smell coming from the incision site.  You have swelling, redness, or drainage at the incision site.  You have more swelling or pain at the port site or the surrounding area.  You have a fever that is not controlled with medicine.  This information is not intended to replace advice given to you by your health care provider. Make sure you discuss any questions you have with your health care provider. Document  Released: 12/19/2004 Document Revised: 05/27/2015 Document Reviewed: 08/26/2012 Elsevier Interactive Patient Education  2017 Elsevier Inc.  

## 2016-04-20 NOTE — Progress Notes (Signed)
  [place] OFFICE PROGRESS NOTE   Diagnosis:  Colon cancer  INTERVAL HISTORY:   Brenda Becker returns as scheduled. She completed cycle 3 FOLFIRI/Avastin 04/06/2016. She denies nausea/vomiting. No mouth sores. No diarrhea. She denies any bleeding. No shortness of breath or chest pain. No abdominal pain. No rash. No leg swelling or calf pain. No pain following the Neulasta injection.  Objective:  Vital signs in last 24 hours:  Blood pressure (!) 149/91, pulse 85, temperature 98.6 F (37 C), temperature source Oral, resp. rate 18, SpO2 100 %.    HEENT: No thrush or ulcers. Resp: Lungs clear bilaterally. Cardio: Regular rate and rhythm. GI: Abdomen soft and nontender. No hepatomegaly. Vascular: No leg edema. Calves soft and nontender. Skin: No rash. Port-A-Cath without erythema.    Lab Results:  Lab Results  Component Value Date   WBC 6.6 04/20/2016   HGB 13.6 04/20/2016   HCT 40.1 04/20/2016   MCV 84.5 04/20/2016   PLT 164 04/20/2016   NEUTROABS 3.7 04/20/2016    Imaging:  No results found.  Medications: I have reviewed the patient's current medications.  Assessment/Plan: 1. 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  Cycle 2 CAPOX 01/22/2015  Cycle 3 CAPOX 02/12/2015  Cycle 4 CAPOX 03/05/2015  Cycle 5 CAPOX 03/26/2015  Cycle 6 CAPOX 04/16/2015  Cycle 7 CAPOX 05/07/2015 (oxaliplatin held due to progressive neuropathy)  Cycle 8 CAPOX 05/28/2015 (oxaliplatin held secondary to neuropathy)  CTs 11/01/2015-no significant change and an 11 mm upper abdomen mesenteric lymph node with partial calcification-treated metastasis?, No other evidence of metastatic disease  CTs 01/21/2016-mild  interval progression of previously identified calcified nodal tissue in the upper central abdomen. Development of small adjacent lymph nodes in the interval.  EUS guided biopsy of the calcified upper abdominal mass on 02/17/2016 confirmed metastatic adenocarcinoma  Cycle 1 FOLFIRI/Avastin 03/09/2016  Cycle 2 FOLFIRI/Avastin 03/23/2016  Cycle 3 FOLFIRI/Avastin 04/06/2016 (Neulasta added)  Cycle 4 FOLFIRI/Avastin 04/20/2016 2. History of Microcytic anemia-likely iron deficiency anemia secondary to #1 and ongoing menses 3. Status post cholecystectomy 4. Family history of breast and ovarian cancer. She has seen the genetics counselor. 5. Delayed nausea following cycle 1 CAPOX. Aloxi and Emend added beginning with cycle 2. 6. Transient episodes of dyspnea following cycle 4 and cycle 5 CAPOX, likely neurotoxicity from oxaliplatin 7. Oxaliplatin neuropathy-improved 8. Mild neutropenia secondary to chemotherapy-Neulasta added beginning with cycle 3 FOLFIRI/Avastin   Disposition: Brenda Becker appears stable. She has completed 3 cycles of FOLFIRI/Avastin. Plan to proceed with cycle 4 today as scheduled. The plan is to obtain restaging CT scans after cycle 6. She will return for a follow-up visit in 2 weeks.  Plan reviewed with Dr. [name].    [name]   04/20/2016  9:39 AM

## 2016-04-20 NOTE — Telephone Encounter (Signed)
Appointments scheduled per 4.19.18 LOS. Patient given AVS report and calendars with future scheduled appointments. °

## 2016-04-22 ENCOUNTER — Ambulatory Visit: Payer: PRIVATE HEALTH INSURANCE

## 2016-04-22 ENCOUNTER — Ambulatory Visit (HOSPITAL_BASED_OUTPATIENT_CLINIC_OR_DEPARTMENT_OTHER): Payer: PRIVATE HEALTH INSURANCE

## 2016-04-22 VITALS — BP 139/89 | HR 71 | Temp 98.9°F | Resp 19

## 2016-04-22 DIAGNOSIS — Z5189 Encounter for other specified aftercare: Secondary | ICD-10-CM | POA: Diagnosis not present

## 2016-04-22 DIAGNOSIS — C184 Malignant neoplasm of transverse colon: Secondary | ICD-10-CM | POA: Diagnosis not present

## 2016-04-22 MED ORDER — PEGFILGRASTIM INJECTION 6 MG/0.6ML ~~LOC~~
6.0000 mg | PREFILLED_SYRINGE | Freq: Once | SUBCUTANEOUS | Status: AC
  Administered 2016-04-22: 6 mg via SUBCUTANEOUS

## 2016-04-22 MED ORDER — SODIUM CHLORIDE 0.9% FLUSH
10.0000 mL | INTRAVENOUS | Status: DC | PRN
  Administered 2016-04-22: 10 mL
  Filled 2016-04-22: qty 10

## 2016-04-22 MED ORDER — HEPARIN SOD (PORK) LOCK FLUSH 100 UNIT/ML IV SOLN
500.0000 [IU] | Freq: Once | INTRAVENOUS | Status: AC | PRN
  Administered 2016-04-22: 500 [IU]
  Filled 2016-04-22: qty 5

## 2016-04-22 NOTE — Patient Instructions (Signed)
Pegfilgrastim injection What is this medicine? PEGFILGRASTIM (PEG fil gra stim) is a long-acting granulocyte colony-stimulating factor that stimulates the growth of neutrophils, a type of white blood cell important in the body's fight against infection. It is used to reduce the incidence of fever and infection in patients with certain types of cancer who are receiving chemotherapy that affects the bone marrow, and to increase survival after being exposed to high doses of radiation. This medicine may be used for other purposes; ask your health care provider or pharmacist if you have questions. COMMON BRAND NAME(S): Neulasta What should I tell my health care provider before I take this medicine? They need to know if you have any of these conditions: -kidney disease -latex allergy -ongoing radiation therapy -sickle cell disease -skin reactions to acrylic adhesives (On-Body Injector only) -an unusual or allergic reaction to pegfilgrastim, filgrastim, other medicines, foods, dyes, or preservatives -pregnant or trying to get pregnant -breast-feeding How should I use this medicine? This medicine is for injection under the skin. If you get this medicine at home, you will be taught how to prepare and give the pre-filled syringe or how to use the On-body Injector. Refer to the patient Instructions for Use for detailed instructions. Use exactly as directed. Tell your healthcare provider immediately if you suspect that the On-body Injector may not have performed as intended or if you suspect the use of the On-body Injector resulted in a missed or partial dose. It is important that you put your used needles and syringes in a special sharps container. Do not put them in a trash can. If you do not have a sharps container, call your pharmacist or healthcare provider to get one. Talk to your pediatrician regarding the use of this medicine in children. While this drug may be prescribed for selected conditions,  precautions do apply. Overdosage: If you think you have taken too much of this medicine contact a poison control center or emergency room at once. NOTE: This medicine is only for you. Do not share this medicine with others. What if I miss a dose? It is important not to miss your dose. Call your doctor or health care professional if you miss your dose. If you miss a dose due to an On-body Injector failure or leakage, a new dose should be administered as soon as possible using a single prefilled syringe for manual use. What may interact with this medicine? Interactions have not been studied. Give your health care provider a list of all the medicines, herbs, non-prescription drugs, or dietary supplements you use. Also tell them if you smoke, drink alcohol, or use illegal drugs. Some items may interact with your medicine. This list may not describe all possible interactions. Give your health care provider a list of all the medicines, herbs, non-prescription drugs, or dietary supplements you use. Also tell them if you smoke, drink alcohol, or use illegal drugs. Some items may interact with your medicine. What should I watch for while using this medicine? You may need blood work done while you are taking this medicine. If you are going to need a MRI, CT scan, or other procedure, tell your doctor that you are using this medicine (On-Body Injector only). What side effects may I notice from receiving this medicine? Side effects that you should report to your doctor or health care professional as soon as possible: -allergic reactions like skin rash, itching or hives, swelling of the face, lips, or tongue -dizziness -fever -pain, redness, or irritation at site   where injected -pinpoint red spots on the skin -red or dark-brown urine -shortness of breath or breathing problems -stomach or side pain, or pain at the shoulder -swelling -tiredness -trouble passing urine or change in the amount of urine Side  effects that usually do not require medical attention (report to your doctor or health care professional if they continue or are bothersome): -bone pain -muscle pain This list may not describe all possible side effects. Call your doctor for medical advice about side effects. You may report side effects to FDA at 1-800-FDA-1088. Where should I keep my medicine? Keep out of the reach of children. Store pre-filled syringes in a refrigerator between 2 and 8 degrees C (36 and 46 degrees F). Do not freeze. Keep in carton to protect from light. Throw away this medicine if it is left out of the refrigerator for more than 48 hours. Throw away any unused medicine after the expiration date. NOTE: This sheet is a summary. It may not cover all possible information. If you have questions about this medicine, talk to your doctor, pharmacist, or health care provider.  2018 Elsevier/Gold Standard (2015-12-16 12:58:03)  

## 2016-04-30 ENCOUNTER — Other Ambulatory Visit: Payer: Self-pay | Admitting: Oncology

## 2016-05-04 ENCOUNTER — Ambulatory Visit: Payer: PRIVATE HEALTH INSURANCE

## 2016-05-04 ENCOUNTER — Ambulatory Visit (HOSPITAL_BASED_OUTPATIENT_CLINIC_OR_DEPARTMENT_OTHER): Payer: PRIVATE HEALTH INSURANCE | Admitting: Oncology

## 2016-05-04 ENCOUNTER — Ambulatory Visit (HOSPITAL_BASED_OUTPATIENT_CLINIC_OR_DEPARTMENT_OTHER): Payer: PRIVATE HEALTH INSURANCE

## 2016-05-04 ENCOUNTER — Telehealth: Payer: Self-pay | Admitting: Oncology

## 2016-05-04 ENCOUNTER — Other Ambulatory Visit (HOSPITAL_BASED_OUTPATIENT_CLINIC_OR_DEPARTMENT_OTHER): Payer: PRIVATE HEALTH INSURANCE

## 2016-05-04 VITALS — BP 132/90 | HR 81 | Temp 98.9°F | Resp 17 | Ht 65.0 in | Wt 225.5 lb

## 2016-05-04 VITALS — BP 124/80

## 2016-05-04 DIAGNOSIS — C184 Malignant neoplasm of transverse colon: Secondary | ICD-10-CM | POA: Diagnosis not present

## 2016-05-04 DIAGNOSIS — Z5112 Encounter for antineoplastic immunotherapy: Secondary | ICD-10-CM

## 2016-05-04 DIAGNOSIS — G620 Drug-induced polyneuropathy: Secondary | ICD-10-CM

## 2016-05-04 DIAGNOSIS — Z5111 Encounter for antineoplastic chemotherapy: Secondary | ICD-10-CM

## 2016-05-04 DIAGNOSIS — Z95828 Presence of other vascular implants and grafts: Secondary | ICD-10-CM

## 2016-05-04 DIAGNOSIS — D701 Agranulocytosis secondary to cancer chemotherapy: Secondary | ICD-10-CM | POA: Diagnosis not present

## 2016-05-04 DIAGNOSIS — K1230 Oral mucositis (ulcerative), unspecified: Secondary | ICD-10-CM | POA: Diagnosis not present

## 2016-05-04 LAB — CBC WITH DIFFERENTIAL/PLATELET
BASO%: 0.4 % (ref 0.0–2.0)
Basophils Absolute: 0 10*3/uL (ref 0.0–0.1)
EOS%: 2 % (ref 0.0–7.0)
Eosinophils Absolute: 0.2 10*3/uL (ref 0.0–0.5)
HCT: 39.9 % (ref 34.8–46.6)
HGB: 13 g/dL (ref 11.6–15.9)
LYMPH%: 30.4 % (ref 14.0–49.7)
MCH: 28.4 pg (ref 25.1–34.0)
MCHC: 32.6 g/dL (ref 31.5–36.0)
MCV: 87.1 fL (ref 79.5–101.0)
MONO#: 0.5 10*3/uL (ref 0.1–0.9)
MONO%: 6.5 % (ref 0.0–14.0)
NEUT#: 4.6 10*3/uL (ref 1.5–6.5)
NEUT%: 60.7 % (ref 38.4–76.8)
Platelets: 169 10*3/uL (ref 145–400)
RBC: 4.58 10*6/uL (ref 3.70–5.45)
RDW: 15.7 % — ABNORMAL HIGH (ref 11.2–14.5)
WBC: 7.5 10*3/uL (ref 3.9–10.3)
lymph#: 2.3 10*3/uL (ref 0.9–3.3)

## 2016-05-04 LAB — COMPREHENSIVE METABOLIC PANEL
ALT: 38 U/L (ref 0–55)
ANION GAP: 9 meq/L (ref 3–11)
AST: 24 U/L (ref 5–34)
Albumin: 3.7 g/dL (ref 3.5–5.0)
Alkaline Phosphatase: 99 U/L (ref 40–150)
BUN: 6.3 mg/dL — ABNORMAL LOW (ref 7.0–26.0)
CHLORIDE: 111 meq/L — AB (ref 98–109)
CO2: 22 meq/L (ref 22–29)
Calcium: 9.1 mg/dL (ref 8.4–10.4)
Creatinine: 0.9 mg/dL (ref 0.6–1.1)
EGFR: 82 mL/min/{1.73_m2} — AB (ref >90)
Glucose: 117 mg/dl (ref 70–140)
Potassium: 3.7 mEq/L (ref 3.5–5.1)
SODIUM: 142 meq/L (ref 136–145)
Total Bilirubin: 0.4 mg/dL (ref 0.20–1.20)
Total Protein: 6.9 g/dL (ref 6.4–8.3)

## 2016-05-04 LAB — UA PROTEIN, DIPSTICK - CHCC: Protein, ur: NEGATIVE mg/dL

## 2016-05-04 MED ORDER — SODIUM CHLORIDE 0.9 % IV SOLN
2325.0000 mg/m2 | INTRAVENOUS | Status: DC
  Administered 2016-05-04: 5000 mg via INTRAVENOUS
  Filled 2016-05-04: qty 100

## 2016-05-04 MED ORDER — SODIUM CHLORIDE 0.9 % IV SOLN
Freq: Once | INTRAVENOUS | Status: AC
  Administered 2016-05-04: 11:00:00 via INTRAVENOUS

## 2016-05-04 MED ORDER — LEUCOVORIN CALCIUM INJECTION 350 MG
400.0000 mg/m2 | Freq: Once | INTRAVENOUS | Status: AC
  Administered 2016-05-04: 860 mg via INTRAVENOUS
  Filled 2016-05-04: qty 43

## 2016-05-04 MED ORDER — DEXAMETHASONE SODIUM PHOSPHATE 10 MG/ML IJ SOLN
INTRAMUSCULAR | Status: AC
  Filled 2016-05-04: qty 1

## 2016-05-04 MED ORDER — ATROPINE SULFATE 1 MG/ML IJ SOLN
0.5000 mg | Freq: Once | INTRAMUSCULAR | Status: AC | PRN
  Administered 2016-05-04: 0.5 mg via INTRAVENOUS

## 2016-05-04 MED ORDER — FLUOROURACIL CHEMO INJECTION 2.5 GM/50ML
400.0000 mg/m2 | Freq: Once | INTRAVENOUS | Status: AC
  Administered 2016-05-04: 850 mg via INTRAVENOUS
  Filled 2016-05-04: qty 17

## 2016-05-04 MED ORDER — SODIUM CHLORIDE 0.9 % IV SOLN
5.0000 mg/kg | Freq: Once | INTRAVENOUS | Status: AC
  Administered 2016-05-04: 500 mg via INTRAVENOUS
  Filled 2016-05-04: qty 16

## 2016-05-04 MED ORDER — PALONOSETRON HCL INJECTION 0.25 MG/5ML
INTRAVENOUS | Status: AC
  Filled 2016-05-04: qty 5

## 2016-05-04 MED ORDER — ATROPINE SULFATE 1 MG/ML IJ SOLN
INTRAMUSCULAR | Status: AC
  Filled 2016-05-04: qty 1

## 2016-05-04 MED ORDER — IRINOTECAN HCL CHEMO INJECTION 100 MG/5ML
180.0000 mg/m2 | Freq: Once | INTRAVENOUS | Status: AC
  Administered 2016-05-04: 380 mg via INTRAVENOUS
  Filled 2016-05-04: qty 15

## 2016-05-04 MED ORDER — PALONOSETRON HCL INJECTION 0.25 MG/5ML
0.2500 mg | Freq: Once | INTRAVENOUS | Status: AC
  Administered 2016-05-04: 0.25 mg via INTRAVENOUS

## 2016-05-04 MED ORDER — SODIUM CHLORIDE 0.9 % IJ SOLN
10.0000 mL | INTRAMUSCULAR | Status: DC | PRN
  Administered 2016-05-04: 10 mL via INTRAVENOUS
  Filled 2016-05-04: qty 10

## 2016-05-04 MED ORDER — DEXAMETHASONE SODIUM PHOSPHATE 10 MG/ML IJ SOLN
10.0000 mg | Freq: Once | INTRAMUSCULAR | Status: AC
  Administered 2016-05-04: 10 mg via INTRAVENOUS

## 2016-05-04 NOTE — Telephone Encounter (Signed)
Appointments complete per 5/3 los. Appointments complete while patient in infusion area. Patient will get print out in infusion area.

## 2016-05-04 NOTE — Progress Notes (Addendum)
[facility] OFFICE PROGRESS NOTE   Diagnosis: Colon cancer  INTERVAL HISTORY:   Brenda Becker returns as scheduled. She completed another cycle of FOLFIRI/Avastin on 04/20/2016. No nausea, diarrhea, bleeding, or symptom of thrombosis. She developed sores at the palate earlier this week. These are improving. She relates this to eating chips.  Objective:  Vital signs in last 24 hours:  Blood pressure 132/90, pulse 81, temperature 98.9 F (37.2 C), temperature source Oral, resp. rate 17, height _0  (1.651 m), weight 225 lb 8 oz (102.3 kg), SpO2 100 %.    HEENT: No thrush, erythema with superficial ulcerations at the right anterior palate Resp: Lungs clear bilaterally Cardio: Regular rate and rhythm GI: No hepatosplenomegaly, no mass, nontender Vascular: No leg edema  Skin: Hyperpigmentation of the hands   Portacath/PICC-without erythema  Lab Results:  Lab Results  Component Value Date   WBC 7.5 05/04/2016   HGB 13.0 05/04/2016   HCT 39.9 05/04/2016   MCV 87.1 05/04/2016   PLT 169 05/04/2016   NEUTROABS 4.6 05/04/2016    CMP     Component Value Date/Time   NA 142 05/04/2016 0840   K 3.7 05/04/2016 0840   CO2 22 05/04/2016 0840   GLUCOSE 117 05/04/2016 0840   BUN 6.3 (L) 05/04/2016 0840   CREATININE 0.9 05/04/2016 0840   CALCIUM 9.1 05/04/2016 0840   PROT 6.9 05/04/2016 0840   ALBUMIN 3.7 05/04/2016 0840   AST 24 05/04/2016 0840   ALT 38 05/04/2016 0840   ALKPHOS 99 05/04/2016 0840   BILITOT 0.40 05/04/2016 0840    Lab Results  Component Value Date   CEA1 1.75 03/09/2016     Medications: I have reviewed the patient's current medications.  Assessment/Plan: 1. 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  Cycle 2 CAPOX 01/22/2015  Cycle 3 CAPOX 02/12/2015  Cycle 4 CAPOX 03/05/2015  Cycle 5 CAPOX 03/26/2015  Cycle 6 CAPOX 04/16/2015  Cycle 7 CAPOX 05/07/2015 (oxaliplatin held due to progressive neuropathy)  Cycle 8 CAPOX 05/28/2015 (oxaliplatin held secondary to neuropathy)  CTs 11/01/2015-no significant change and an 11 mm upper abdomen mesenteric lymph node with partial calcification-treated metastasis?, No other evidence of metastatic disease  CTs 01/21/2016-mild interval progression of previously identified calcified nodal tissue in the upper central abdomen. Development of small adjacent lymph nodes in the interval.  EUS guided biopsy of the calcified upper abdominal mass on 02/17/2016 confirmed metastatic adenocarcinoma  Cycle 1 FOLFIRI/Avastin 03/09/2016  Cycle 2 FOLFIRI/Avastin 03/23/2016  Cycle 3 FOLFIRI/Avastin 04/06/2016 (Neulasta added)  Cycle 4 FOLFIRI/Avastin 04/20/2016  Cycle 5 FOLFIRI/Avastin 05/04/2016 2. History of Microcytic anemia-likely iron deficiency anemia secondary to #1 and ongoing menses 3. Status post cholecystectomy 4. Family history of breast and ovarian cancer. She has seen the genetics counselor. 5. Delayed nausea following cycle 1 CAPOX. Aloxi and Emend added beginning with cycle 2. 6. Transient episodes of dyspnea following cycle 4 and cycle 5 CAPOX, likely neurotoxicity from oxaliplatin 7. Oxaliplatin neuropathy-improved 8. Mild neutropenia secondary to chemotherapy-Neulasta added beginning with cycle 3 FOLFIRI/Avastin 9. Mild mucositis following cycle 4 FOLFIRI/Avastin    Disposition:  Ms. Morell continues to tolerate the chemotherapy well. The ulcerations at the palate may be related to mucositis from chemotherapy or another etiology. She will  contact us if she develops progressive ulcerations. She will complete cycle 5 FOLFIRI/Avastin today. Ms. Ayllon will return for an  office visit and chemotherapy in 2 weeks. She will undergo a restaging CT evaluation after cycle 6.  15 minutes were spent with the patient today. The majority of the time was used for counseling and coordination of care.  Betsy Coder, MD  05/04/2016  1:39 PM

## 2016-05-04 NOTE — Patient Instructions (Signed)
[facility] Discharge Instructions for Patients Receiving Chemotherapy  Today you received the following chemotherapy agents Irinotecan, Leucovorin, Avastin and Adrucil.  To help prevent nausea and vomiting after your treatment, we encourage you to take your nausea medication as directed BUT NO ZOFRAN FOR 3 DAYS - TAKE COMPAZINE THE FIRST 3 DAYS.    If you develop nausea and vomiting that is not controlled by your nausea medication, call the clinic.   BELOW ARE SYMPTOMS THAT SHOULD BE REPORTED IMMEDIATELY:  *FEVER GREATER THAN 100.5 F  *CHILLS WITH OR WITHOUT FEVER  NAUSEA AND VOMITING THAT IS NOT CONTROLLED WITH YOUR NAUSEA MEDICATION  *UNUSUAL SHORTNESS OF BREATH  *UNUSUAL BRUISING OR BLEEDING  TENDERNESS IN MOUTH AND THROAT WITH OR WITHOUT PRESENCE OF ULCERS  *URINARY PROBLEMS  *BOWEL PROBLEMS  UNUSUAL RASH Items with * indicate a potential emergency and should be followed up as soon as possible.  Feel free to call the clinic you have any questions or concerns. The clinic phone number is [phone].  Please show the [place] at check-in to the Emergency Department and triage nurse.

## 2016-05-06 ENCOUNTER — Ambulatory Visit: Payer: PRIVATE HEALTH INSURANCE

## 2016-05-06 ENCOUNTER — Ambulatory Visit (HOSPITAL_BASED_OUTPATIENT_CLINIC_OR_DEPARTMENT_OTHER): Payer: PRIVATE HEALTH INSURANCE

## 2016-05-06 DIAGNOSIS — C184 Malignant neoplasm of transverse colon: Secondary | ICD-10-CM

## 2016-05-06 MED ORDER — HEPARIN SOD (PORK) LOCK FLUSH 100 UNIT/ML IV SOLN
500.0000 [IU] | Freq: Once | INTRAVENOUS | Status: AC | PRN
  Administered 2016-05-06: 500 [IU]
  Filled 2016-05-06: qty 5

## 2016-05-06 MED ORDER — PEGFILGRASTIM INJECTION 6 MG/0.6ML ~~LOC~~
6.0000 mg | PREFILLED_SYRINGE | Freq: Once | SUBCUTANEOUS | Status: AC
  Administered 2016-05-06: 6 mg via SUBCUTANEOUS

## 2016-05-06 MED ORDER — SODIUM CHLORIDE 0.9% FLUSH
10.0000 mL | INTRAVENOUS | Status: DC | PRN
  Administered 2016-05-06: 10 mL
  Filled 2016-05-06: qty 10

## 2016-05-09 ENCOUNTER — Other Ambulatory Visit: Payer: Self-pay | Admitting: Obstetrics & Gynecology

## 2016-05-09 DIAGNOSIS — Z1231 Encounter for screening mammogram for malignant neoplasm of breast: Secondary | ICD-10-CM

## 2016-05-11 ENCOUNTER — Ambulatory Visit
Admission: RE | Admit: 2016-05-11 | Discharge: 2016-05-11 | Disposition: A | Discharge status: Home / Self Care | Payer: PRIVATE HEALTH INSURANCE | Source: Ambulatory Visit | Attending: Obstetrics & Gynecology | Admitting: Obstetrics & Gynecology

## 2016-05-11 DIAGNOSIS — Z1231 Encounter for screening mammogram for malignant neoplasm of breast: Secondary | ICD-10-CM

## 2016-05-11 HISTORY — DX: Malignant neoplasm of colon, unspecified: C18.9

## 2016-05-12 ENCOUNTER — Other Ambulatory Visit: Payer: Self-pay | Admitting: Obstetrics & Gynecology

## 2016-05-12 DIAGNOSIS — R928 Other abnormal and inconclusive findings on diagnostic imaging of breast: Secondary | ICD-10-CM

## 2016-05-14 ENCOUNTER — Other Ambulatory Visit: Payer: Self-pay | Admitting: Oncology

## 2016-05-16 ENCOUNTER — Telehealth: Payer: Self-pay | Admitting: *Deleted

## 2016-05-16 ENCOUNTER — Encounter: Payer: Self-pay | Admitting: Obstetrics & Gynecology

## 2016-05-16 ENCOUNTER — Ambulatory Visit (INDEPENDENT_AMBULATORY_CARE_PROVIDER_SITE_OTHER): Payer: PRIVATE HEALTH INSURANCE | Admitting: Obstetrics & Gynecology

## 2016-05-16 VITALS — BP 142/88 | Ht 64.5 in | Wt 224.0 lb

## 2016-05-16 DIAGNOSIS — Z1151 Encounter for screening for human papillomavirus (HPV): Secondary | ICD-10-CM | POA: Diagnosis not present

## 2016-05-16 DIAGNOSIS — C184 Malignant neoplasm of transverse colon: Secondary | ICD-10-CM | POA: Diagnosis not present

## 2016-05-16 DIAGNOSIS — Z3041 Encounter for surveillance of contraceptive pills: Secondary | ICD-10-CM

## 2016-05-16 DIAGNOSIS — K644 Residual hemorrhoidal skin tags: Secondary | ICD-10-CM

## 2016-05-16 DIAGNOSIS — Z01419 Encounter for gynecological examination (general) (routine) without abnormal findings: Secondary | ICD-10-CM

## 2016-05-16 MED ORDER — LIDOCAINE-HYDROCORTISONE ACE 3-2.5 % RE KIT: 1.0000 "application " | PACK | Freq: Every day | RECTAL | 5 refills | Status: DC

## 2016-05-16 MED ORDER — NORETHIN ACE-ETH ESTRAD-FE 1-20 MG-MCG PO TABS: 1.0000 | ORAL_TABLET | Freq: Every day | ORAL | 4 refills | Status: DC

## 2016-05-16 MED ORDER — LIDOCAINE-PRILOCAINE 2.5-2.5 % EX CREA: TOPICAL_CREAM | CUTANEOUS | 5 refills | Status: DC

## 2016-05-16 NOTE — Telephone Encounter (Signed)
Yes, agree with switch.

## 2016-05-16 NOTE — Telephone Encounter (Signed)
Rebecca called from [facility] stating lidocaine kit is not covered with insurance, lidocaine-prilocaine 2.5 % cream is covered and pt has had this in past. Okay to switch? 

## 2016-05-16 NOTE — Progress Notes (Signed)
Brenda Becker 12-16-1970 [id number]   History:    46 y.o. G1P1 married.  Daughter 39 yo.  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  Well on Junel 1/20.  No BTB.  No pelvic pain.  Normal vaginal secretions.  C/O painful/mildly bleeding hemorrhoids x started Chemo, using Prep H with some relief.  Breasts wnl, but Screening Mammo 05/11/2016 inclomplete, Dx Rt Mammo scheduled this week.  Past medical history,surgical history, family history and social history were all reviewed and documented in the EPIC chart.  Gynecologic History Patient's last menstrual period was 05/16/2016. Contraception: OCP (estrogen/progesterone) Last Pap: 2016. Results were: normal Last mammogram: 05/11/2016. Results were: Incomplete, pending Dx Rt Mammo  Obstetric History OB History  Gravida Para Term Preterm AB Living  '1 1       1  '$ SAB TAB Ectopic Multiple Live Births               # Outcome Date GA Lbr Len/2nd Weight Sex Delivery Anes PTL Lv  1 Para                ROS: A ROS was performed and pertinent positives and negatives are included in the history.  GENERAL: No fevers or chills. HEENT: No change in vision, no earache, sore throat or sinus congestion. NECK: No pain or stiffness. CARDIOVASCULAR: No chest pain or pressure. No palpitations. PULMONARY: No shortness of breath, cough or wheeze. GASTROINTESTINAL: No abdominal pain, nausea, vomiting or diarrhea, melena or bright red blood per rectum. GENITOURINARY: No urinary frequency, urgency, hesitancy or dysuria. MUSCULOSKELETAL: No joint or muscle pain, no back pain, no recent trauma. DERMATOLOGIC: No rash, no itching, no lesions. ENDOCRINE: No polyuria, polydipsia, no heat or cold intolerance. No recent change in weight. HEMATOLOGICAL: No anemia or easy bruising or bleeding. NEUROLOGIC: No headache, seizures, numbness, tingling or weakness. PSYCHIATRIC: No  depression, no loss of interest in normal activity or change in sleep pattern.     Exam:   BP (!) 142/88   Ht 5' 4.5" (1.638 m)   Wt 224 lb (101.6 kg)   LMP 05/16/2016   BMI 37.86 kg/m   Body mass index is 37.86 kg/m.  General appearance : Well developed well nourished female. No acute distress HEENT: Eyes: no retinal hemorrhage or exudates,  Neck supple, trachea midline, no carotid bruits, no thyroidmegaly Lungs: Clear to auscultation, no rhonchi or wheezes, or rib retractions  Heart: Regular rate and rhythm, no murmurs or gallops Breast:Examined in sitting and supine position were symmetrical in appearance, no palpable masses or tenderness,  no skin retraction, no nipple inversion, no nipple discharge, no skin discoloration, no axillary or supraclavicular lymphadenopathy Abdomen: no palpable masses or tenderness, no rebound or guarding Extremities: no edema or skin discoloration or tenderness  Pelvic:  Bartholin, Urethra, Skene Glands: Within normal limits             Vagina: No gross lesions or discharge  Cervix: No gross lesions or discharge.  Pap/HPV done.  Uterus  AV, normal size, shape and consistency, non-tender and mobile  Adnexa  Without masses or tenderness  Anus and perineum  normal    Assessment/Plan:  46 y.o. female for annual exam   1. Encounter for routine gynecological examination with Papanicolaou smear of cervix Normal Gyn exam.  Pap/HPV done.  Dx Rt Mammo pending.  2. Encounter for surveillance of contraceptive pills Junel  1/20 represcribed.  Prescription sent.  3. External hemorrhoid Will try Generic of Anamantle.  Prescription sent.  4. Cancer of transverse colon [facility]) Chemotherapy for Para-Aortic metastasis currently.  Counseling >50% x 10 minutes of above issues.  [name] MD, 8:41 AM 05/16/2016

## 2016-05-16 NOTE — Patient Instructions (Signed)
1. Encounter for routine gynecological examination with Papanicolaou smear of cervix Normal Gyn exam.  Pap/HPV done.  Dx Rt Mammo pending.  2. Encounter for surveillance of contraceptive pills Junel 1/20 represcribed.  Prescription sent.  3. External hemorrhoid Will try Generic of Anamantle.  Prescription sent.  4. Cancer of transverse colon [facility]) Chemotherapy for Para-Aortic metastasis currently.  Brenda Becker, it was a pleasure to see you today!  I will inform you of your results as soon as available.

## 2016-05-16 NOTE — Telephone Encounter (Signed)
New Rx sent.

## 2016-05-16 NOTE — Addendum Note (Signed)
Addended by: Thurnell Garbe A on: 05/16/2016 09:25 AM   Modules accepted: Orders

## 2016-05-17 ENCOUNTER — Ambulatory Visit
Admission: RE | Admit: 2016-05-17 | Discharge: 2016-05-17 | Disposition: A | Discharge status: Home / Self Care | Payer: PRIVATE HEALTH INSURANCE | Source: Ambulatory Visit | Attending: Obstetrics & Gynecology | Admitting: Obstetrics & Gynecology

## 2016-05-17 ENCOUNTER — Ambulatory Visit: Payer: PRIVATE HEALTH INSURANCE

## 2016-05-17 ENCOUNTER — Other Ambulatory Visit: Payer: Self-pay | Admitting: Obstetrics & Gynecology

## 2016-05-17 DIAGNOSIS — R928 Other abnormal and inconclusive findings on diagnostic imaging of breast: Secondary | ICD-10-CM

## 2016-05-17 DIAGNOSIS — N6310 Unspecified lump in the right breast, unspecified quadrant: Secondary | ICD-10-CM

## 2016-05-17 LAB — PAP, TP IMAGING W/ HPV RNA, RFLX HPV TYPE 16,18/45: HPV mRNA, High Risk: NOT DETECTED

## 2016-05-18 ENCOUNTER — Ambulatory Visit: Payer: PRIVATE HEALTH INSURANCE

## 2016-05-18 ENCOUNTER — Other Ambulatory Visit (HOSPITAL_BASED_OUTPATIENT_CLINIC_OR_DEPARTMENT_OTHER): Payer: PRIVATE HEALTH INSURANCE

## 2016-05-18 ENCOUNTER — Ambulatory Visit (HOSPITAL_BASED_OUTPATIENT_CLINIC_OR_DEPARTMENT_OTHER): Payer: PRIVATE HEALTH INSURANCE | Admitting: Nurse Practitioner

## 2016-05-18 ENCOUNTER — Ambulatory Visit (HOSPITAL_BASED_OUTPATIENT_CLINIC_OR_DEPARTMENT_OTHER): Payer: PRIVATE HEALTH INSURANCE

## 2016-05-18 VITALS — BP 145/90 | HR 74 | Temp 99.2°F | Resp 18 | Ht 64.5 in | Wt 230.2 lb

## 2016-05-18 VITALS — BP 142/82

## 2016-05-18 DIAGNOSIS — C184 Malignant neoplasm of transverse colon: Secondary | ICD-10-CM

## 2016-05-18 DIAGNOSIS — D509 Iron deficiency anemia, unspecified: Secondary | ICD-10-CM | POA: Diagnosis not present

## 2016-05-18 DIAGNOSIS — Z5111 Encounter for antineoplastic chemotherapy: Secondary | ICD-10-CM | POA: Diagnosis not present

## 2016-05-18 DIAGNOSIS — Z95828 Presence of other vascular implants and grafts: Secondary | ICD-10-CM

## 2016-05-18 DIAGNOSIS — D701 Agranulocytosis secondary to cancer chemotherapy: Secondary | ICD-10-CM

## 2016-05-18 DIAGNOSIS — C779 Secondary and unspecified malignant neoplasm of lymph node, unspecified: Secondary | ICD-10-CM

## 2016-05-18 DIAGNOSIS — Z5112 Encounter for antineoplastic immunotherapy: Secondary | ICD-10-CM

## 2016-05-18 DIAGNOSIS — G629 Polyneuropathy, unspecified: Secondary | ICD-10-CM

## 2016-05-18 LAB — CBC WITH DIFFERENTIAL/PLATELET
BASO%: 0.6 % (ref 0.0–2.0)
Basophils Absolute: 0 10*3/uL (ref 0.0–0.1)
EOS ABS: 0.2 10*3/uL (ref 0.0–0.5)
EOS%: 3.4 % (ref 0.0–7.0)
HCT: 40.5 % (ref 34.8–46.6)
HGB: 12.9 g/dL (ref 11.6–15.9)
LYMPH%: 34.7 % (ref 14.0–49.7)
MCH: 28.8 pg (ref 25.1–34.0)
MCHC: 31.9 g/dL (ref 31.5–36.0)
MCV: 90.4 fL (ref 79.5–101.0)
MONO#: 0.5 10*3/uL (ref 0.1–0.9)
MONO%: 7.2 % (ref 0.0–14.0)
NEUT%: 54.1 % (ref 38.4–76.8)
NEUTROS ABS: 3.8 10*3/uL (ref 1.5–6.5)
Platelets: 169 10*3/uL (ref 145–400)
RBC: 4.48 10*6/uL (ref 3.70–5.45)
RDW: 16 % — ABNORMAL HIGH (ref 11.2–14.5)
WBC: 7 10*3/uL (ref 3.9–10.3)
lymph#: 2.4 10*3/uL (ref 0.9–3.3)

## 2016-05-18 LAB — COMPREHENSIVE METABOLIC PANEL
ALT: 50 U/L (ref 0–55)
AST: 33 U/L (ref 5–34)
Albumin: 3.6 g/dL (ref 3.5–5.0)
Alkaline Phosphatase: 88 U/L (ref 40–150)
Anion Gap: 8 mEq/L (ref 3–11)
BUN: 8.8 mg/dL (ref 7.0–26.0)
CALCIUM: 9.1 mg/dL (ref 8.4–10.4)
CHLORIDE: 110 meq/L — AB (ref 98–109)
CO2: 23 mEq/L (ref 22–29)
Creatinine: 0.9 mg/dL (ref 0.6–1.1)
EGFR: 74 mL/min/{1.73_m2} — ABNORMAL LOW (ref >90)
Glucose: 125 mg/dl (ref 70–140)
POTASSIUM: 3.8 meq/L (ref 3.5–5.1)
SODIUM: 141 meq/L (ref 136–145)
Total Bilirubin: 0.47 mg/dL (ref 0.20–1.20)
Total Protein: 6.7 g/dL (ref 6.4–8.3)

## 2016-05-18 LAB — UA PROTEIN, DIPSTICK - CHCC: PROTEIN: NEGATIVE mg/dL

## 2016-05-18 MED ORDER — SODIUM CHLORIDE 0.9 % IV SOLN
2325.0000 mg/m2 | INTRAVENOUS | Status: DC
  Administered 2016-05-18: 5000 mg via INTRAVENOUS
  Filled 2016-05-18: qty 100

## 2016-05-18 MED ORDER — BEVACIZUMAB CHEMO INJECTION 400 MG/16ML
5.0000 mg/kg | Freq: Once | INTRAVENOUS | Status: AC
  Administered 2016-05-18: 500 mg via INTRAVENOUS
  Filled 2016-05-18: qty 16

## 2016-05-18 MED ORDER — ATROPINE SULFATE 1 MG/ML IJ SOLN
INTRAMUSCULAR | Status: AC
  Filled 2016-05-18: qty 1

## 2016-05-18 MED ORDER — PALONOSETRON HCL INJECTION 0.25 MG/5ML
0.2500 mg | Freq: Once | INTRAVENOUS | Status: AC
  Administered 2016-05-18: 0.25 mg via INTRAVENOUS

## 2016-05-18 MED ORDER — DEXAMETHASONE SODIUM PHOSPHATE 10 MG/ML IJ SOLN
INTRAMUSCULAR | Status: AC
  Filled 2016-05-18: qty 1

## 2016-05-18 MED ORDER — PALONOSETRON HCL INJECTION 0.25 MG/5ML
INTRAVENOUS | Status: AC
  Filled 2016-05-18: qty 5

## 2016-05-18 MED ORDER — SODIUM CHLORIDE 0.9 % IJ SOLN
10.0000 mL | INTRAMUSCULAR | Status: DC | PRN
  Administered 2016-05-18: 10 mL via INTRAVENOUS
  Filled 2016-05-18: qty 10

## 2016-05-18 MED ORDER — FLUOROURACIL CHEMO INJECTION 2.5 GM/50ML
400.0000 mg/m2 | Freq: Once | INTRAVENOUS | Status: AC
  Administered 2016-05-18: 850 mg via INTRAVENOUS
  Filled 2016-05-18: qty 17

## 2016-05-18 MED ORDER — ATROPINE SULFATE 1 MG/ML IJ SOLN
0.5000 mg | Freq: Once | INTRAMUSCULAR | Status: AC | PRN
  Administered 2016-05-18: 0.5 mg via INTRAVENOUS

## 2016-05-18 MED ORDER — SODIUM CHLORIDE 0.9 % IV SOLN
Freq: Once | INTRAVENOUS | Status: AC
  Administered 2016-05-18: 10:00:00 via INTRAVENOUS

## 2016-05-18 MED ORDER — DEXAMETHASONE SODIUM PHOSPHATE 10 MG/ML IJ SOLN
10.0000 mg | Freq: Once | INTRAMUSCULAR | Status: AC
  Administered 2016-05-18: 10 mg via INTRAVENOUS

## 2016-05-18 MED ORDER — IRINOTECAN HCL CHEMO INJECTION 100 MG/5ML
180.0000 mg/m2 | Freq: Once | INTRAVENOUS | Status: AC
  Administered 2016-05-18: 380 mg via INTRAVENOUS
  Filled 2016-05-18: qty 15

## 2016-05-18 MED ORDER — DEXTROSE 5 % IV SOLN
400.0000 mg/m2 | Freq: Once | INTRAVENOUS | Status: AC
  Administered 2016-05-18: 860 mg via INTRAVENOUS
  Filled 2016-05-18: qty 43

## 2016-05-18 NOTE — Progress Notes (Signed)
[place] OFFICE PROGRESS NOTE   Diagnosis:  Colon cancer  INTERVAL HISTORY:   Brenda Becker returns as scheduled. She completed cycle 5 FOLFIRI/Avastin 05/04/2016. She denies nausea/vomiting. No mouth sores. She has periodic loose stools. She takes Imodium with good relief. She has bleeding related to hemorrhoids. No other bleeding. She denies shortness of breath. No chest pain. No leg swelling or calf pain.  Objective:  Vital signs in last 24 hours:  Blood pressure (!) 145/90, pulse 74, temperature 99.2 F (37.3 C), temperature source Oral, resp. rate 18, height 5' 4.5" (1.638 m), weight 230 lb 3.2 oz (104.4 kg), last menstrual period 05/16/2016, SpO2 100 %.    HEENT: No thrush or ulcers. Resp: Lungs clear bilaterally. Cardio: Regular rate and rhythm. GI: Abdomen soft and nontender. No hepatomegaly. Vascular: No leg edema. Calves soft and nontender.  Skin: No rash. Port-A-Cath without erythema.    Lab Results:  Lab Results  Component Value Date   WBC 7.0 05/18/2016   HGB 12.9 05/18/2016   HCT 40.5 05/18/2016   MCV 90.4 05/18/2016   PLT 169 05/18/2016   NEUTROABS 3.8 05/18/2016    Imaging:  US Breast Ltd Uni Right Inc Axilla  Result Date: 05/17/2016 CLINICAL DATA:  Screening recall for a possible right breast mass.The patient is currently undergoing chemotherapy for stage III colon cancer. EXAM: 2D DIGITAL DIAGNOSTIC UNILATERAL RIGHT MAMMOGRAM WITH CAD AND ADJUNCT TOMO RIGHT BREAST ULTRASOUND COMPARISON:  Previous exam(s). ACR Breast Density Category b: There are scattered areas of fibroglandular density. FINDINGS: In the upper-outer quadrant of the right breast, posterior depth there is an obscured oval mass measuring approximately 1.2 cm. The visualized margins are circumscribed. This is inferior to a degenerating fibroadenoma with numerous coarse calcifications. Mammographic images were processed with CAD. Ultrasound of the right breast at 10 o'clock, 8  cm from the nipple demonstrates a hypoechoic oval mass with circumscribed and indistinct margins and a posterior acoustic enhancement measuring 1.0 x 0.8 x 1.0 cm. IMPRESSION: The right breast mass at 10 o'clock most likely represents a cyst, however is somewhat hypoechoic, and could also represent a fibroadenoma. RECOMMENDATION: Given that the patient is currently undergoing chemotherapy for colon cancer, and that the diagnostic images are probably, but not definitely benign ultrasound-guided aspiration versus biopsy is recommended for the right breast mass at 10 o'clock. The patient has been scheduled for 05/22/2016 at 12:45 p.m. I have discussed the findings and recommendations with the patient. Results were also provided in writing at the conclusion of the visit. If applicable, a reminder letter will be sent to the patient regarding the next appointment. BI-RADS CATEGORY  4: Suspicious. Electronically Signed   By: Ammie Ferrier M.D.   On: 05/17/2016 11:58   Mm Diag Breast Tomo Uni Right  Result Date: 05/17/2016 CLINICAL DATA:  Screening recall for a possible right breast mass.The patient is currently undergoing chemotherapy for stage III colon cancer. EXAM: 2D DIGITAL DIAGNOSTIC UNILATERAL RIGHT MAMMOGRAM WITH CAD AND ADJUNCT TOMO RIGHT BREAST ULTRASOUND COMPARISON:  Previous exam(s). ACR Breast Density Category b: There are scattered areas of fibroglandular density. FINDINGS: In the upper-outer quadrant of the right breast, posterior depth there is an obscured oval mass measuring approximately 1.2 cm. The visualized margins are circumscribed. This is inferior to a degenerating fibroadenoma with numerous coarse calcifications. Mammographic images were processed with CAD. Ultrasound of the right breast at 10 o'clock, 8 cm from the nipple demonstrates a hypoechoic oval mass with circumscribed and indistinct margins and  a posterior acoustic enhancement measuring 1.0 x 0.8 x 1.0 cm. IMPRESSION: The right  breast mass at 10 o'clock most likely represents a cyst, however is somewhat hypoechoic, and could also represent a fibroadenoma. RECOMMENDATION: Given that the patient is currently undergoing chemotherapy for colon cancer, and that the diagnostic images are probably, but not definitely benign ultrasound-guided aspiration versus biopsy is recommended for the right breast mass at 10 o'clock. The patient has been scheduled for 05/22/2016 at 12:45 p.m. I have discussed the findings and recommendations with the patient. Results were also provided in writing at the conclusion of the visit. If applicable, a reminder letter will be sent to the patient regarding the next appointment. BI-RADS CATEGORY  4: Suspicious. Electronically Signed   By: Ammie Ferrier M.D.   On: 05/17/2016 11:58    Medications: I have reviewed the patient's current medications.  Assessment/Plan: 1. 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  Cycle 2 CAPOX 01/22/2015  Cycle 3 CAPOX 02/12/2015  Cycle 4 CAPOX 03/05/2015  Cycle 5 CAPOX 03/26/2015  Cycle 6 CAPOX 04/16/2015  Cycle 7 CAPOX 05/07/2015 (oxaliplatin held due to progressive neuropathy)  Cycle 8 CAPOX 05/28/2015 (oxaliplatin held secondary to neuropathy)  CTs 11/01/2015-no significant change and an 11 mm upper abdomen mesenteric lymph node with partial calcification-treated metastasis?, No other evidence of metastatic disease  CTs 01/21/2016-mild interval progression of previously identified calcified nodal tissue in the upper central abdomen. Development of small adjacent lymph nodes in the interval.  EUS guided biopsy of the calcified upper abdominal mass on 02/17/2016  confirmed metastatic adenocarcinoma  Cycle 1 FOLFIRI/Avastin 03/09/2016  Cycle 2 FOLFIRI/Avastin 03/23/2016  Cycle 3 FOLFIRI/Avastin 04/06/2016 (Neulasta added)  Cycle 4 FOLFIRI/Avastin 04/20/2016  Cycle 5 FOLFIRI/Avastin 05/04/2016  Cycle 6 FOLFIRI/Avastin 05/18/2016 2. History of Microcytic anemia-likely iron deficiency anemia secondary to #1 and ongoing menses 3. Status post cholecystectomy 4. Family history of breast and ovarian cancer. She has seen the genetics counselor. 5. Delayed nausea following cycle 1 CAPOX. Aloxi and Emend added beginning with cycle 2. 6. Transient episodes of dyspnea following cycle 4 and cycle 5 CAPOX, likely neurotoxicity from oxaliplatin 7. Oxaliplatin neuropathy-improved 8. Mild neutropenia secondary to chemotherapy-Neulasta added beginning with cycle 3 FOLFIRI/Avastin 9. Mild mucositis following cycle 4 FOLFIRI/Avastin 10. Mammogram 05/11/2016-possible mass right breast. Right breast ultrasound 05/17/2016-right breast mass at 10:00 most likely represents a cyst, however is somewhat hypoechoic and could also represent a fibroadenoma. Ultrasound-guided aspiration versus biopsy scheduled 05/22/2016.   Disposition: Brenda Becker appears stable. She has completed 5 cycles of FOLFIRI/Avastin. Plan to proceed with cycle 6 today as scheduled. She will have restaging CT scans next week and return for a follow-up visit in 2 weeks. She will contact the office in the interim with any problems.    [name]   05/18/2016  9:38 AM

## 2016-05-18 NOTE — Patient Instructions (Signed)
[place] Discharge Instructions for Patients Receiving Chemotherapy  Today you received the following chemotherapy agents Irinotecan, Leucovorin, Avastin and Adrucil.  To help prevent nausea and vomiting after your treatment, we encourage you to take your nausea medication as directed BUT NO ZOFRAN FOR 3 DAYS - TAKE COMPAZINE THE FIRST 3 DAYS.    If you develop nausea and vomiting that is not controlled by your nausea medication, call the clinic.   BELOW ARE SYMPTOMS THAT SHOULD BE REPORTED IMMEDIATELY:  *FEVER GREATER THAN 100.5 F  *CHILLS WITH OR WITHOUT FEVER  NAUSEA AND VOMITING THAT IS NOT CONTROLLED WITH YOUR NAUSEA MEDICATION  *UNUSUAL SHORTNESS OF BREATH  *UNUSUAL BRUISING OR BLEEDING  TENDERNESS IN MOUTH AND THROAT WITH OR WITHOUT PRESENCE OF ULCERS  *URINARY PROBLEMS  *BOWEL PROBLEMS  UNUSUAL RASH Items with * indicate a potential emergency and should be followed up as soon as possible.  Feel free to call the clinic you have any questions or concerns. The clinic phone number is [phone].  Please show the [place] at check-in to the Emergency Department and triage nurse.

## 2016-05-18 NOTE — Patient Instructions (Signed)

## 2016-05-19 ENCOUNTER — Encounter: Payer: Self-pay | Admitting: *Deleted

## 2016-05-20 ENCOUNTER — Ambulatory Visit (HOSPITAL_BASED_OUTPATIENT_CLINIC_OR_DEPARTMENT_OTHER): Payer: PRIVATE HEALTH INSURANCE

## 2016-05-20 VITALS — BP 137/92 | HR 66 | Temp 98.9°F | Resp 18

## 2016-05-20 DIAGNOSIS — Z5189 Encounter for other specified aftercare: Secondary | ICD-10-CM

## 2016-05-20 DIAGNOSIS — C184 Malignant neoplasm of transverse colon: Secondary | ICD-10-CM

## 2016-05-20 MED ORDER — PEGFILGRASTIM INJECTION 6 MG/0.6ML ~~LOC~~
6.0000 mg | PREFILLED_SYRINGE | Freq: Once | SUBCUTANEOUS | Status: AC
  Administered 2016-05-20: 6 mg via SUBCUTANEOUS

## 2016-05-20 MED ORDER — SODIUM CHLORIDE 0.9% FLUSH
10.0000 mL | INTRAVENOUS | Status: DC | PRN
  Administered 2016-05-20: 10 mL
  Filled 2016-05-20: qty 10

## 2016-05-20 MED ORDER — HEPARIN SOD (PORK) LOCK FLUSH 100 UNIT/ML IV SOLN
500.0000 [IU] | Freq: Once | INTRAVENOUS | Status: AC | PRN
  Administered 2016-05-20: 500 [IU]
  Filled 2016-05-20: qty 5

## 2016-05-20 NOTE — Patient Instructions (Signed)
Pegfilgrastim injection What is this medicine? PEGFILGRASTIM (PEG fil gra stim) is a long-acting granulocyte colony-stimulating factor that stimulates the growth of neutrophils, a type of white blood cell important in the body's fight against infection. It is used to reduce the incidence of fever and infection in patients with certain types of cancer who are receiving chemotherapy that affects the bone marrow, and to increase survival after being exposed to high doses of radiation. This medicine may be used for other purposes; ask your health care provider or pharmacist if you have questions. COMMON BRAND NAME(S): Neulasta What should I tell my health care provider before I take this medicine? They need to know if you have any of these conditions: -kidney disease -latex allergy -ongoing radiation therapy -sickle cell disease -skin reactions to acrylic adhesives (On-Body Injector only) -an unusual or allergic reaction to pegfilgrastim, filgrastim, other medicines, foods, dyes, or preservatives -pregnant or trying to get pregnant -breast-feeding How should I use this medicine? This medicine is for injection under the skin. If you get this medicine at home, you will be taught how to prepare and give the pre-filled syringe or how to use the On-body Injector. Refer to the patient Instructions for Use for detailed instructions. Use exactly as directed. Tell your healthcare provider immediately if you suspect that the On-body Injector may not have performed as intended or if you suspect the use of the On-body Injector resulted in a missed or partial dose. It is important that you put your used needles and syringes in a special sharps container. Do not put them in a trash can. If you do not have a sharps container, call your pharmacist or healthcare provider to get one. Talk to your pediatrician regarding the use of this medicine in children. While this drug may be prescribed for selected conditions,  precautions do apply. Overdosage: If you think you have taken too much of this medicine contact a poison control center or emergency room at once. NOTE: This medicine is only for you. Do not share this medicine with others. What if I miss a dose? It is important not to miss your dose. Call your doctor or health care professional if you miss your dose. If you miss a dose due to an On-body Injector failure or leakage, a new dose should be administered as soon as possible using a single prefilled syringe for manual use. What may interact with this medicine? Interactions have not been studied. Give your health care provider a list of all the medicines, herbs, non-prescription drugs, or dietary supplements you use. Also tell them if you smoke, drink alcohol, or use illegal drugs. Some items may interact with your medicine. This list may not describe all possible interactions. Give your health care provider a list of all the medicines, herbs, non-prescription drugs, or dietary supplements you use. Also tell them if you smoke, drink alcohol, or use illegal drugs. Some items may interact with your medicine. What should I watch for while using this medicine? You may need blood work done while you are taking this medicine. If you are going to need a MRI, CT scan, or other procedure, tell your doctor that you are using this medicine (On-Body Injector only). What side effects may I notice from receiving this medicine? Side effects that you should report to your doctor or health care professional as soon as possible: -allergic reactions like skin rash, itching or hives, swelling of the face, lips, or tongue -dizziness -fever -pain, redness, or irritation at site   where injected -pinpoint red spots on the skin -red or dark-brown urine -shortness of breath or breathing problems -stomach or side pain, or pain at the shoulder -swelling -tiredness -trouble passing urine or change in the amount of urine Side  effects that usually do not require medical attention (report to your doctor or health care professional if they continue or are bothersome): -bone pain -muscle pain This list may not describe all possible side effects. Call your doctor for medical advice about side effects. You may report side effects to FDA at 1-800-FDA-1088. Where should I keep my medicine? Keep out of the reach of children. Store pre-filled syringes in a refrigerator between 2 and 8 degrees C (36 and 46 degrees F). Do not freeze. Keep in carton to protect from light. Throw away this medicine if it is left out of the refrigerator for more than 48 hours. Throw away any unused medicine after the expiration date. NOTE: This sheet is a summary. It may not cover all possible information. If you have questions about this medicine, talk to your doctor, pharmacist, or health care provider.  2018 Elsevier/Gold Standard (2015-12-16 12:58:03) Tunneled Central Venous Catheter Flushing Guide It is important to wash out (flush) your tunneled central venous catheter each time you use it, both before and after you use it. Flushing your catheter will help prevent it from clogging. How to flush your catheter Supplies Needed   A clean pair of gloves.  An alcohol wipe.  A 10 mL syringe that has been prefilled with saline solution.  An empty 10 mL syringe (if a substance called heparin was injected into your catheter). Flushing Your Catheter Before Use  If There Is Heparin in Your Catheter 1. Wash your hands with soap and water. 2. Put on gloves. 3. Scrub the injection cap for 15 seconds with an alcohol wipe. 4. Unclamp the catheter. 5. Attach the empty syringe to the injection cap. 6. Pull the syringe plunger back and withdraw 10 mL of blood. 7. Place the syringe into an appropriate waste container. 8. Scrub the injection cap for 15 seconds with an alcohol wipe. 9. Attach the prefilled syringe to the injection cap. 10. Flush the  catheter by pushing the plunger forward until all the liquid from the syringe is in the catheter. 11. Remove the syringe from the injection cap. 12. Clamp the catheter. If There Is No Heparin in Your Catheter 1. Wash your hands with soap and water. 2. Put on gloves. 3. Scrub the injection cap for 15 seconds with an alcohol wipe. 4. Unclamp the catheter. 5. Attach the prefilled syringe to the injection cap. 6. Flush the catheter by pushing the plunger forward until 5 mL of the liquid from the syringe is in the catheter. 7. Pull back on the syringe until you see blood in the catheter. 8. If you have been asked to collect any blood, follow your health care provider's instructions. Otherwise, flush the catheter with the rest of the solution from the syringe. 9. Remove the syringe from the injection cap. 10. Clamp the catheter. Flushing Your Catheter After Use  1. Wash your hands with soap and water. 2. Put on gloves. 3. Scrub the injection cap for 15 seconds with an alcohol wipe. 4. Unclamp the catheter. 5. Attach the prefilled syringe to the injection cap. 6. Flush the catheter by pushing the plunger forward until all of the liquid from the syringe is in the catheter. 7. Remove the syringe from the injection cap. 8. Clamp  the catheter. Problems and solutions  If blood cannot be completely cleared from the injection cap, you may need to have the injection cap replaced.  If the catheter is difficult to flush, use the "pulsing" method. The pulsing method involves pushing only a few milliliters of solution into the catheter at a time and pausing between pushes.  If you do not see blood in the catheter when you pull back on the syringe, change your body position, such as by raising your arms above your head. Take a deep breath and cough. Then, pull back on the syringe. If you still do not see blood, flush the catheter with a small amount of solution. Then, change positions again and take a  breath or cough. Pull back on the syringe again. If you still do not see blood, finish flushing the catheter and contact your health care provider. Do not use your catheter until your health care provider says it is okay. Additional tips and information  Have someone help you flush your catheter, if possible.  Do not force fluid through your catheter.  Do not use a syringe that is larger or smaller than 10 mL. Using a smaller syringe can make the catheter burst.  Do not use your catheter without flushing it first, if it has heparin in it. Contact a health care provider if:  When flushing your catheter before you use it, you cannot see any blood in the catheter.  Your catheter is difficult to flush. Get help right away if:  You cannot flush the catheter.  The catheter leaks when you flush it or when there is fluid in it.  There are cracks or breaks in the catheter. This information is not intended to replace advice given to you by your health care provider. Make sure you discuss any questions you have with your health care provider. Document Released: 12/08/2010 Document Revised: 08/22/2015 Document Reviewed: 04/13/2014 Elsevier Interactive Patient Education  2017 [employer].

## 2016-05-22 ENCOUNTER — Ambulatory Visit
Admission: RE | Admit: 2016-05-22 | Discharge: 2016-05-22 | Disposition: A | Discharge status: Home / Self Care | Payer: PRIVATE HEALTH INSURANCE | Source: Ambulatory Visit | Attending: Obstetrics & Gynecology | Admitting: Obstetrics & Gynecology

## 2016-05-22 DIAGNOSIS — N6310 Unspecified lump in the right breast, unspecified quadrant: Secondary | ICD-10-CM

## 2016-05-25 ENCOUNTER — Telehealth: Payer: Self-pay | Admitting: Oncology

## 2016-05-25 NOTE — Telephone Encounter (Signed)
No los per 05/18/16 visit. °

## 2016-05-30 ENCOUNTER — Ambulatory Visit (HOSPITAL_COMMUNITY)
Admission: RE | Admit: 2016-05-30 | Discharge: 2016-05-30 | Disposition: A | Discharge status: Home / Self Care | Payer: PRIVATE HEALTH INSURANCE | Source: Ambulatory Visit | Attending: Oncology | Admitting: Oncology

## 2016-05-30 DIAGNOSIS — D259 Leiomyoma of uterus, unspecified: Secondary | ICD-10-CM | POA: Insufficient documentation

## 2016-05-30 DIAGNOSIS — R590 Localized enlarged lymph nodes: Secondary | ICD-10-CM | POA: Diagnosis not present

## 2016-05-30 DIAGNOSIS — K760 Fatty (change of) liver, not elsewhere classified: Secondary | ICD-10-CM | POA: Insufficient documentation

## 2016-05-30 DIAGNOSIS — R93421 Abnormal radiologic findings on diagnostic imaging of right kidney: Secondary | ICD-10-CM | POA: Insufficient documentation

## 2016-05-30 DIAGNOSIS — R93422 Abnormal radiologic findings on diagnostic imaging of left kidney: Secondary | ICD-10-CM | POA: Diagnosis not present

## 2016-05-30 DIAGNOSIS — C184 Malignant neoplasm of transverse colon: Secondary | ICD-10-CM | POA: Insufficient documentation

## 2016-05-30 MED ORDER — IOPAMIDOL (ISOVUE-300) INJECTION 61%
INTRAVENOUS | Status: AC
  Administered 2016-05-30: 100 mL
  Filled 2016-05-30: qty 100

## 2016-06-01 ENCOUNTER — Ambulatory Visit: Payer: PRIVATE HEALTH INSURANCE

## 2016-06-01 ENCOUNTER — Telehealth: Payer: Self-pay | Admitting: Nurse Practitioner

## 2016-06-01 ENCOUNTER — Other Ambulatory Visit (HOSPITAL_BASED_OUTPATIENT_CLINIC_OR_DEPARTMENT_OTHER): Payer: PRIVATE HEALTH INSURANCE

## 2016-06-01 ENCOUNTER — Ambulatory Visit (HOSPITAL_BASED_OUTPATIENT_CLINIC_OR_DEPARTMENT_OTHER): Payer: PRIVATE HEALTH INSURANCE | Admitting: Nurse Practitioner

## 2016-06-01 VITALS — BP 141/90 | HR 85 | Temp 98.7°F | Resp 18 | Ht 64.5 in | Wt 232.4 lb

## 2016-06-01 DIAGNOSIS — K649 Unspecified hemorrhoids: Secondary | ICD-10-CM

## 2016-06-01 DIAGNOSIS — R599 Enlarged lymph nodes, unspecified: Secondary | ICD-10-CM

## 2016-06-01 DIAGNOSIS — C779 Secondary and unspecified malignant neoplasm of lymph node, unspecified: Secondary | ICD-10-CM | POA: Diagnosis not present

## 2016-06-01 DIAGNOSIS — C184 Malignant neoplasm of transverse colon: Secondary | ICD-10-CM

## 2016-06-01 DIAGNOSIS — Z95828 Presence of other vascular implants and grafts: Secondary | ICD-10-CM

## 2016-06-01 LAB — COMPREHENSIVE METABOLIC PANEL
ALBUMIN: 3.7 g/dL (ref 3.5–5.0)
ALK PHOS: 102 U/L (ref 40–150)
ALT: 32 U/L (ref 0–55)
AST: 22 U/L (ref 5–34)
Anion Gap: 8 mEq/L (ref 3–11)
BILIRUBIN TOTAL: 0.37 mg/dL (ref 0.20–1.20)
BUN: 6.9 mg/dL — AB (ref 7.0–26.0)
CALCIUM: 9.1 mg/dL (ref 8.4–10.4)
CO2: 23 mEq/L (ref 22–29)
Chloride: 110 mEq/L — ABNORMAL HIGH (ref 98–109)
Creatinine: 0.9 mg/dL (ref 0.6–1.1)
EGFR: 80 mL/min/{1.73_m2} — AB (ref >90)
GLUCOSE: 117 mg/dL (ref 70–140)
POTASSIUM: 3.9 meq/L (ref 3.5–5.1)
SODIUM: 142 meq/L (ref 136–145)
TOTAL PROTEIN: 6.9 g/dL (ref 6.4–8.3)

## 2016-06-01 LAB — CBC WITH DIFFERENTIAL/PLATELET
BASO%: 0.9 % (ref 0.0–2.0)
BASOS ABS: 0.1 10*3/uL (ref 0.0–0.1)
EOS ABS: 0.3 10*3/uL (ref 0.0–0.5)
EOS%: 3.5 % (ref 0.0–7.0)
HEMATOCRIT: 41.5 % (ref 34.8–46.6)
HEMOGLOBIN: 13.4 g/dL (ref 11.6–15.9)
LYMPH#: 2.5 10*3/uL (ref 0.9–3.3)
LYMPH%: 26.5 % (ref 14.0–49.7)
MCH: 29.1 pg (ref 25.1–34.0)
MCHC: 32.3 g/dL (ref 31.5–36.0)
MCV: 90 fL (ref 79.5–101.0)
MONO#: 0.5 10*3/uL (ref 0.1–0.9)
MONO%: 5.8 % (ref 0.0–14.0)
NEUT%: 63.3 % (ref 38.4–76.8)
NEUTROS ABS: 5.9 10*3/uL (ref 1.5–6.5)
Platelets: 180 10*3/uL (ref 145–400)
RBC: 4.61 10*6/uL (ref 3.70–5.45)
RDW: 16.5 % — AB (ref 11.2–14.5)
WBC: 9.3 10*3/uL (ref 3.9–10.3)

## 2016-06-01 LAB — CEA (IN HOUSE-CHCC): CEA (CHCC-In House): 1.86 ng/mL (ref 0.00–5.00)

## 2016-06-01 LAB — UA PROTEIN, DIPSTICK - CHCC: PROTEIN: NEGATIVE mg/dL

## 2016-06-01 MED ORDER — SODIUM CHLORIDE 0.9 % IJ SOLN
10.0000 mL | INTRAMUSCULAR | Status: DC | PRN
  Administered 2016-06-01: 10 mL via INTRAVENOUS
  Filled 2016-06-01: qty 10

## 2016-06-01 MED ORDER — HEPARIN SOD (PORK) LOCK FLUSH 100 UNIT/ML IV SOLN
500.0000 [IU] | Freq: Once | INTRAVENOUS | Status: AC | PRN
  Administered 2016-06-01: 500 [IU] via INTRAVENOUS
  Filled 2016-06-01: qty 5

## 2016-06-01 NOTE — Progress Notes (Addendum)
[place] OFFICE PROGRESS NOTE   Diagnosis:  Colon cancer  INTERVAL HISTORY:   Ms. Defilippo returns as scheduled. She completed cycle 6 FOLFIRI/Avastin 05/18/2016. She denies nausea/vomiting. No mouth sores. No diarrhea. No hand or foot pain or redness. She has intermittent hemorrhoidal bleeding. No other bleeding. No shortness of breath or chest pain. No leg swelling or calf pain.  Objective:  Vital signs in last 24 hours:  Blood pressure (!) 141/90, pulse 85, temperature 98.7 F (37.1 C), temperature source Oral, resp. rate 18, height 5' 4.5" (1.638 m), weight 232 lb 6.4 oz (105.4 kg), last menstrual period 05/23/2016, SpO2 100 %.    HEENT: No thrush or ulcers. Resp: Lungs clear bilaterally. Cardio: Regular rate and rhythm. GI: Abdomen soft and nontender. No hepatomegaly. Vascular: No leg edema. Skin: Palms with hyperpigmentation. No erythema or skin breakdown. Port-A-Cath without erythema.    Lab Results:  Lab Results  Component Value Date   WBC 9.3 06/01/2016   HGB 13.4 06/01/2016   HCT 41.5 06/01/2016   MCV 90.0 06/01/2016   PLT 180 06/01/2016   NEUTROABS 5.9 06/01/2016    Imaging:  No results found.  Medications: I have reviewed the patient's current medications.  Assessment/Plan: 1. 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  Cycle 2 CAPOX 01/22/2015  Cycle 3 CAPOX 02/12/2015  Cycle 4 CAPOX 03/05/2015  Cycle 5 CAPOX 03/26/2015  Cycle 6 CAPOX 04/16/2015  Cycle 7 CAPOX 05/07/2015 (oxaliplatin held due to progressive neuropathy)  Cycle 8 CAPOX 05/28/2015 (oxaliplatin held secondary to neuropathy)  CTs 11/01/2015-no significant change and an  11 mm upper abdomen mesenteric lymph node with partial calcification-treated metastasis?, No other evidence of metastatic disease  CTs 01/21/2016-mild interval progression of previously identified calcified nodal tissue in the upper central abdomen. Development of small adjacent lymph nodes in the interval.  EUS guided biopsy of the calcified upper abdominal mass on 02/17/2016 confirmed metastatic adenocarcinoma  Cycle 1 FOLFIRI/Avastin 03/09/2016  Cycle 2 FOLFIRI/Avastin 03/23/2016  Cycle 3 FOLFIRI/Avastin 04/06/2016 (Neulasta added)  Cycle 4 FOLFIRI/Avastin 04/20/2016  Cycle 5 FOLFIRI/Avastin 05/04/2016  Cycle 6 FOLFIRI/Avastin 05/18/2016  CT abdomen/pelvis 05/30/2016-new retroperitoneal lymphadenopathy in the para-aortic region with the largest node measuring 1.5 cm. Partially calcified mesenteric lymph node measures 12 mm as compared to 14 mm previously. Other scattered less than 1 cm mesenteric lymph nodes remain stable. 2. History of Microcytic anemia-likely iron deficiency anemia secondary to #1 and ongoing menses 3. Status post cholecystectomy 4. Family history of breast and ovarian cancer. She has seen the genetics counselor. 5. Delayed nausea following cycle 1 CAPOX. Aloxi and Emend added beginning with cycle 2. 6. Transient episodes of dyspnea following cycle 4 and cycle 5 CAPOX, likely neurotoxicity from oxaliplatin 7. Oxaliplatin neuropathy-improved 8. Mild neutropenia secondary to chemotherapy-Neulasta added beginning with cycle 3 FOLFIRI/Avastin 9. Mild mucositis following cycle 4 FOLFIRI/Avastin 10. Mammogram 05/11/2016-possible mass right breast. Right breast ultrasound 05/17/2016-right breast mass at 10:00 most likely represents a cyst, however is somewhat hypoechoic and could also represent a fibroadenoma. Ultrasound-guided aspiration of a 1 cm benign cyst 05/22/2016.    Disposition: Ms. Ruffini has completed 6 cycles of FOLFIRI/Avastin. The mesenteric adenopathy  measured slightly smaller on the recent restaging CT evaluation. New retroperitoneal lymphadenopathy was noted. Dr. [name] reviewed  the CT report/images with Ms. Coryell and her family. We are referring her for a PET scan. They understand that if the PET scan confirms metastatic disease involving the retroperitoneal lymph nodes no therapy will be curative. We will refer her to Dr. Johney Maine at [facility] to be seen following the PET scan.  She will return for a follow-up visit here in approximately 2 weeks.   Patient seen with Dr. [name]. 25 minutes were spent face-to-face at today's visit with the majority of that time involved in counseling/coordination of care.    [name]   06/01/2016  10:05 AM  This was a shared visit with Ned Card. We reviewed the CT images with Ms. Branaman and her family. She understands there are new versus enlarged retroperitoneal nodes on the current CT. If these represent metastatic disease she will most likely not be a candidate for HIPEC. She will be referred for a PET scan. I will present her case at the GI tumor conference within the next few weeks. She will schedule an appointment with Dr. Johney Maine.  Julieanne Manson, M.D.

## 2016-06-01 NOTE — Patient Instructions (Signed)
Implanted Port Home Guide An implanted port is a type of central line that is placed under the skin. Central lines are used to provide IV access when treatment or nutrition needs to be given through a person's veins. Implanted ports are used for long-term IV access. An implanted port may be placed because:  You need IV medicine that would be irritating to the small veins in your hands or arms.  You need long-term IV medicines, such as antibiotics.  You need IV nutrition for a long period.  You need frequent blood draws for lab tests.  You need dialysis.  Implanted ports are usually placed in the chest area, but they can also be placed in the upper arm, the abdomen, or the leg. An implanted port has two main parts:  Reservoir. The reservoir is round and will appear as a small, raised area under your skin. The reservoir is the part where a needle is inserted to give medicines or draw blood.  Catheter. The catheter is a thin, flexible tube that extends from the reservoir. The catheter is placed into a large vein. Medicine that is inserted into the reservoir goes into the catheter and then into the vein.  How will I care for my incision site? Do not get the incision site wet. Bathe or shower as directed by your health care provider. How is my port accessed? Special steps must be taken to access the port:  Before the port is accessed, a numbing cream can be placed on the skin. This helps numb the skin over the port site.  Your health care provider uses a sterile technique to access the port. ? Your health care provider must put on a mask and sterile gloves. ? The skin over your port is cleaned carefully with an antiseptic and allowed to dry. ? The port is gently pinched between sterile gloves, and a needle is inserted into the port.  Only "non-coring" port needles should be used to access the port. Once the port is accessed, a blood return should be checked. This helps ensure that the port  is in the vein and is not clogged.  If your port needs to remain accessed for a constant infusion, a clear (transparent) bandage will be placed over the needle site. The bandage and needle will need to be changed every week, or as directed by your health care provider.  Keep the bandage covering the needle clean and dry. Do not get it wet. Follow your health care provider's instructions on how to take a shower or bath while the port is accessed.  If your port does not need to stay accessed, no bandage is needed over the port.  What is flushing? Flushing helps keep the port from getting clogged. Follow your health care provider's instructions on how and when to flush the port. Ports are usually flushed with saline solution or a medicine called heparin. The need for flushing will depend on how the port is used.  If the port is used for intermittent medicines or blood draws, the port will need to be flushed: ? After medicines have been given. ? After blood has been drawn. ? As part of routine maintenance.  If a constant infusion is running, the port may not need to be flushed.  How long will my port stay implanted? The port can stay in for as long as your health care provider thinks it is needed. When it is time for the port to come out, surgery will be   done to remove it. The procedure is similar to the one performed when the port was put in. When should I seek immediate medical care? When you have an implanted port, you should seek immediate medical care if:  You notice a bad smell coming from the incision site.  You have swelling, redness, or drainage at the incision site.  You have more swelling or pain at the port site or the surrounding area.  You have a fever that is not controlled with medicine.  This information is not intended to replace advice given to you by your health care provider. Make sure you discuss any questions you have with your health care provider. Document  Released: 12/19/2004 Document Revised: 05/27/2015 Document Reviewed: 08/26/2012 Elsevier Interactive Patient Education  2017 Elsevier Inc.  

## 2016-06-01 NOTE — Telephone Encounter (Signed)
Gave patient AVS and calender per 5/31 LOS - [facility] to contact patient for PET/ CT

## 2016-06-02 ENCOUNTER — Telehealth: Payer: Self-pay | Admitting: *Deleted

## 2016-06-02 NOTE — Telephone Encounter (Signed)
Spoke with Felicia in central scheduling, requested they contact pt to schedule PET. It has been authorized.

## 2016-06-02 NOTE — Addendum Note (Signed)
Addendum  created 06/02/16 1128 by Rica Koyanagi, MD   Sign clinical note

## 2016-06-13 ENCOUNTER — Ambulatory Visit (HOSPITAL_COMMUNITY)
Admission: RE | Admit: 2016-06-13 | Discharge: 2016-06-13 | Disposition: A | Discharge status: Home / Self Care | Payer: PRIVATE HEALTH INSURANCE | Source: Ambulatory Visit | Attending: Nurse Practitioner | Admitting: Nurse Practitioner

## 2016-06-13 DIAGNOSIS — C772 Secondary and unspecified malignant neoplasm of intra-abdominal lymph nodes: Secondary | ICD-10-CM | POA: Diagnosis not present

## 2016-06-13 DIAGNOSIS — C771 Secondary and unspecified malignant neoplasm of intrathoracic lymph nodes: Secondary | ICD-10-CM | POA: Diagnosis not present

## 2016-06-13 DIAGNOSIS — I700 Atherosclerosis of aorta: Secondary | ICD-10-CM | POA: Diagnosis not present

## 2016-06-13 DIAGNOSIS — C184 Malignant neoplasm of transverse colon: Secondary | ICD-10-CM | POA: Diagnosis present

## 2016-06-13 DIAGNOSIS — R590 Localized enlarged lymph nodes: Secondary | ICD-10-CM | POA: Diagnosis present

## 2016-06-13 LAB — GLUCOSE, CAPILLARY: Glucose-Capillary: 74 mg/dL (ref 65–99)

## 2016-06-13 MED ORDER — FLUDEOXYGLUCOSE F - 18 (FDG) INJECTION: 11.6000 | Freq: Once | INTRAVENOUS | Status: DC | PRN

## 2016-06-16 ENCOUNTER — Ambulatory Visit (HOSPITAL_BASED_OUTPATIENT_CLINIC_OR_DEPARTMENT_OTHER): Payer: PRIVATE HEALTH INSURANCE | Admitting: Oncology

## 2016-06-16 ENCOUNTER — Telehealth: Payer: Self-pay | Admitting: Oncology

## 2016-06-16 ENCOUNTER — Telehealth: Payer: Self-pay | Admitting: *Deleted

## 2016-06-16 VITALS — BP 148/96 | HR 96 | Temp 98.7°F | Resp 18 | Ht 64.5 in | Wt 228.3 lb

## 2016-06-16 DIAGNOSIS — R599 Enlarged lymph nodes, unspecified: Secondary | ICD-10-CM

## 2016-06-16 DIAGNOSIS — C184 Malignant neoplasm of transverse colon: Secondary | ICD-10-CM | POA: Diagnosis not present

## 2016-06-16 NOTE — Telephone Encounter (Signed)
Faxed 6/12 PET report to Dr. Johney Maine with note requesting that they cancel pt's scans scheduled for 6/22.

## 2016-06-16 NOTE — Progress Notes (Signed)
[place] OFFICE PROGRESS NOTE   Diagnosis: Colon cancer  INTERVAL HISTORY:   Brenda Becker returns as scheduled. She completed a final cycle of FOLFIRI/Avastin on 05/18/2016. She tolerated chemotherapy well. She feels well. No complaint.  Objective:  Vital signs in last 24 hours:  Blood pressure (!) 148/96, pulse 96, temperature 98.7 F (37.1 C), temperature source Oral, resp. rate 18, height 5' 4.5" (1.638 m), weight 228 lb 4.8 oz (103.6 kg), last menstrual period 05/23/2016, SpO2 100 %.    HEENT: No thrush or ulcers Lymphatics: No cervical, supraclavicular, axillary, or inguinal nodes Resp: Lungs clear bilaterally Cardio: Regular rate and rhythm GI: No hepatosplenomegaly, no mass, nontender Vascular: No leg edema   Portacath/PICC-without erythema  Lab Results:  Lab Results  Component Value Date   WBC 9.3 06/01/2016   HGB 13.4 06/01/2016   HCT 41.5 06/01/2016   MCV 90.0 06/01/2016   PLT 180 06/01/2016   NEUTROABS 5.9 06/01/2016    CMP     Component Value Date/Time   NA 142 06/01/2016 0925   K 3.9 06/01/2016 0925   CO2 23 06/01/2016 0925   GLUCOSE 117 06/01/2016 0925   BUN 6.9 (L) 06/01/2016 0925   CREATININE 0.9 06/01/2016 0925   CALCIUM 9.1 06/01/2016 0925   PROT 6.9 06/01/2016 0925   ALBUMIN 3.7 06/01/2016 0925   AST 22 06/01/2016 0925   ALT 32 06/01/2016 0925   ALKPHOS 102 06/01/2016 0925   BILITOT 0.37 06/01/2016 0925    Lab Results  Component Value Date   CEA1 1.86 06/01/2016     Imaging:  Nm Pet Image Restag (ps) Skull Base To Thigh  Result Date: 06/13/2016 CLINICAL DATA:  Subsequent treatment strategy for restaging of colon cancer. EXAM: NUCLEAR MEDICINE PET SKULL BASE TO THIGH TECHNIQUE: 11.6 mCi F-18 FDG was injected intravenously. Full-ring PET imaging was performed from the skull base to thigh after the radiotracer. CT data was obtained and used for attenuation correction and anatomic localization. FASTING BLOOD GLUCOSE:   Value: 74. Mg/dl COMPARISON:  CTs of 05/30/2016 and 01/21/2016, abdomen pelvis. Chest CT of 11/01/2015. PET of 10/30/2014. FINDINGS: NECK No hypermetabolic nodes within the neck. [place] tonsil hypermetabolism is symmetric and likely physiologic. No cervical adenopathy. CHEST A posterior mediastinal node measures 12 mm and a S.U.V. max of 6.7 on image 71/series 4. This is newly enlarged since the prior PET. No pulmonary parenchymal hypermetabolism identified. Right-sided Port-A-Cath which terminates at the high right atrium. Clear lungs. ABDOMEN/PELVIS Hypermetabolism corresponding to retroperitoneal nodes described on the prior exam. Index preaortic/pre caval node measures 1.4 cm and a S.U.V. max of 6.8 on image 113/series 4. Soft tissue density, likely representing nodal tissue, in the transverse mesocolon measures 1.4 cm and a S.U.V. max of 4.0 on image 105/ series 4. Compare similar in size and a S.U.V. max of 6.4 on the prior. Bilateral hypermetabolic inguinal nodes. An index right-sided node measures 10 mm and a S.U.V. max of 4.5 on image 181/series 4. This node measured 7 mm and was not hypermetabolic on the prior. Cholecystectomy. Abdominal aortic atherosclerosis. SKELETON No abnormal marrow activity. No focal osseous lesion. IMPRESSION: 1. Nodal metastasis within the abdomen and lower chest. 2. Bilateral hypermetabolic inguinal nodes are nonspecific. These could be reactive or represent a somewhat atypical distribution of colon cancer metastasis. 3.  Aortic Atherosclerosis (ICD10-I70.0). Electronically Signed   By: Brenda Becker M.D.   On: 06/13/2016 15:27    Medications: I have reviewed the patient's current medications.  Assessment/Plan: 1. 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  Cycle 2 CAPOX 01/22/2015  Cycle 3 CAPOX 02/12/2015  Cycle 4 CAPOX 03/05/2015  Cycle 5 CAPOX 03/26/2015  Cycle 6 CAPOX 04/16/2015  Cycle 7 CAPOX 05/07/2015 (oxaliplatin held due to progressive neuropathy)  Cycle 8 CAPOX 05/28/2015 (oxaliplatin held secondary to neuropathy)  CTs 11/01/2015-no significant change and an 11 mm upper abdomen mesenteric lymph node with partial calcification-treated metastasis?, No other evidence of metastatic disease  CTs 01/21/2016-mild interval progression of previously identified calcified nodal tissue in the upper central abdomen. Development of small adjacent lymph nodes in the interval.  EUS guided biopsy of the calcified upper abdominal mass on 02/17/2016 confirmed metastatic adenocarcinoma  Cycle 1 FOLFIRI/Avastin 03/09/2016  Cycle 2 FOLFIRI/Avastin 03/23/2016  Cycle 3 FOLFIRI/Avastin 04/06/2016 (Neulasta added)  Cycle 4 FOLFIRI/Avastin 04/20/2016  Cycle 5 FOLFIRI/Avastin 05/04/2016  Cycle 6 FOLFIRI/Avastin 05/18/2016  CT abdomen/pelvis 05/30/2016-new retroperitoneal lymphadenopathy in the para-aortic region with the largest node measuring 1.5 cm. Partially calcified mesenteric lymph node measures 12 mm as compared to 14 mm previously. Other scattered less than 1 cm mesenteric lymph nodes remain stable.  PET scan 06/13/2016-hypermetabolic posterior mediastinal node, hypermetabolic retroperitoneal lymph nodes, stable transverse mesocolon mass, bilateral hypermetabolic inguinal nodes 2. History of Microcytic anemia-likely iron deficiency anemia secondary to #1 and ongoing menses 3. Status post cholecystectomy 4. Family history of breast and ovarian cancer. She has seen the genetics counselor. 5. Delayed nausea following cycle 1 CAPOX. Aloxi and Emend added beginning with cycle 2. 6. Transient episodes of dyspnea following cycle 4 and cycle 5 CAPOX, likely  neurotoxicity from oxaliplatin 7. Oxaliplatin neuropathy-improved 8. Mild neutropenia secondary to chemotherapy-Neulasta added beginning with cycle 3 FOLFIRI/Avastin 9. Mild mucositis following cycle 4 FOLFIRI/Avastin 10. Mammogram 05/11/2016-possible mass right breast. Right breast ultrasound 05/17/2016-right breast mass at 10:00 most likely represents a cyst, however is somewhat hypoechoic and could also represent a fibroadenoma. Ultrasound-guided aspiration of a 1 cm benign cyst 05/22/2016.     Disposition:  Mr. Sonntag appears stable. She is asymptomatic from the metastatic colon cancer. I discussed the PET findings and reviewed the images with her. I explained that abdominal surgery would not be curative if the hypermetabolic lymph nodes in the chest, retroperitoneum, and inguinal regions represent metastatic colon cancer.  She is scheduled to see Dr. Johney Maine on 06/23/2016. She will take the PET images for him to review.  I will review the PET images with interventional radiology to see if a retroperitoneal or inguinal lymph node can be biopsied.  She will return for an office visit 07/03/2016.  30 minutes were spent with the patient today. The majority of the time was used for counseling and coordination of care.  Donneta Romberg, MD  06/16/2016  11:17 AM

## 2016-06-16 NOTE — Telephone Encounter (Signed)
Scheduled appt per 6/15 LOS -Gave patient AVS and calender.

## 2016-06-27 ENCOUNTER — Telehealth: Payer: Self-pay | Admitting: *Deleted

## 2016-06-27 NOTE — Telephone Encounter (Signed)
Message from pt, she has not heard anything about biopsy. She is going to [facility] 6/29-7/6. Needs to reschedule 7/2 office visit.  Informed her the procedure will likely need to be done on a Thursday, will call once scheduled. She voiced understanding.

## 2016-06-27 NOTE — Telephone Encounter (Signed)
Sent message to Dr. [name] to schedule biopsy

## 2016-06-30 ENCOUNTER — Telehealth: Payer: Self-pay | Admitting: Gastroenterology

## 2016-06-30 ENCOUNTER — Other Ambulatory Visit: Payer: Self-pay

## 2016-06-30 DIAGNOSIS — R948 Abnormal results of function studies of other organs and systems: Secondary | ICD-10-CM

## 2016-06-30 DIAGNOSIS — C189 Malignant neoplasm of colon, unspecified: Secondary | ICD-10-CM

## 2016-06-30 NOTE — Telephone Encounter (Signed)
Patty, She needs upper EUS radial +/- linear for colon cancer, abnormal PET scan of chest.  +MAC, next available EUS Thursday.  Thanks   Leta Speller get this scheduled.

## 2016-06-30 NOTE — Telephone Encounter (Signed)
EUS scheduled, pt instructed and medications reviewed.  Patient instructions mailed to home.  Patient to call with any questions or concerns.  

## 2016-07-03 ENCOUNTER — Ambulatory Visit: Payer: PRIVATE HEALTH INSURANCE | Admitting: Oncology

## 2016-07-14 ENCOUNTER — Encounter (HOSPITAL_COMMUNITY): Payer: Self-pay | Admitting: *Deleted

## 2016-07-14 ENCOUNTER — Telehealth: Payer: Self-pay | Admitting: *Deleted

## 2016-07-14 NOTE — Telephone Encounter (Signed)
Called pt with office visit appointment for 7/24. She will need flush that day if port is not used during endoscopy procedure. Spoke with [facility] in endoscopy- they can contact IV team to access port for endoscopy if pt requests.  Left message for pt to apply EMLA, notify staff at check in on 7/19 to have port accessed.

## 2016-07-20 ENCOUNTER — Encounter (HOSPITAL_COMMUNITY): Payer: Self-pay

## 2016-07-20 ENCOUNTER — Encounter (HOSPITAL_COMMUNITY)
Admission: RE | Disposition: A | Discharge status: Home / Self Care | Payer: Self-pay | Source: Ambulatory Visit | Attending: Gastroenterology

## 2016-07-20 ENCOUNTER — Ambulatory Visit (HOSPITAL_COMMUNITY)
Admission: RE | Admit: 2016-07-20 | Discharge: 2016-07-20 | Disposition: A | Discharge status: Home / Self Care | Payer: PRIVATE HEALTH INSURANCE | Source: Ambulatory Visit | Attending: Gastroenterology | Admitting: Gastroenterology

## 2016-07-20 ENCOUNTER — Ambulatory Visit (HOSPITAL_COMMUNITY): Payer: PRIVATE HEALTH INSURANCE | Admitting: Certified Registered Nurse Anesthetist

## 2016-07-20 DIAGNOSIS — Z800 Family history of malignant neoplasm of digestive organs: Secondary | ICD-10-CM | POA: Insufficient documentation

## 2016-07-20 DIAGNOSIS — C189 Malignant neoplasm of colon, unspecified: Secondary | ICD-10-CM

## 2016-07-20 DIAGNOSIS — Z85038 Personal history of other malignant neoplasm of large intestine: Secondary | ICD-10-CM | POA: Insufficient documentation

## 2016-07-20 DIAGNOSIS — Z9221 Personal history of antineoplastic chemotherapy: Secondary | ICD-10-CM | POA: Diagnosis not present

## 2016-07-20 DIAGNOSIS — K219 Gastro-esophageal reflux disease without esophagitis: Secondary | ICD-10-CM | POA: Insufficient documentation

## 2016-07-20 DIAGNOSIS — R948 Abnormal results of function studies of other organs and systems: Secondary | ICD-10-CM

## 2016-07-20 DIAGNOSIS — R222 Localized swelling, mass and lump, trunk: Secondary | ICD-10-CM | POA: Insufficient documentation

## 2016-07-20 DIAGNOSIS — C7889 Secondary malignant neoplasm of other digestive organs: Secondary | ICD-10-CM | POA: Insufficient documentation

## 2016-07-20 DIAGNOSIS — C159 Malignant neoplasm of esophagus, unspecified: Secondary | ICD-10-CM

## 2016-07-20 DIAGNOSIS — R935 Abnormal findings on diagnostic imaging of other abdominal regions, including retroperitoneum: Secondary | ICD-10-CM | POA: Diagnosis present

## 2016-07-20 HISTORY — DX: Headache, unspecified: R51.9

## 2016-07-20 HISTORY — DX: Gastro-esophageal reflux disease without esophagitis: K21.9

## 2016-07-20 HISTORY — PX: EUS: SHX5427

## 2016-07-20 HISTORY — DX: Headache: R51

## 2016-07-20 SURGERY — UPPER ENDOSCOPIC ULTRASOUND (EUS) RADIAL
Anesthesia: Monitor Anesthesia Care

## 2016-07-20 MED ORDER — PROPOFOL 10 MG/ML IV BOLUS
INTRAVENOUS | Status: AC
  Filled 2016-07-20: qty 20

## 2016-07-20 MED ORDER — PROPOFOL 10 MG/ML IV BOLUS
INTRAVENOUS | Status: AC
  Filled 2016-07-20: qty 40

## 2016-07-20 MED ORDER — SODIUM CHLORIDE 0.9% FLUSH
10.0000 mL | INTRAVENOUS | Status: DC | PRN
  Administered 2016-07-20: 10 mL
  Filled 2016-07-20: qty 40

## 2016-07-20 MED ORDER — LACTATED RINGERS IV SOLN
INTRAVENOUS | Status: DC
  Administered 2016-07-20: 1000 mL via INTRAVENOUS

## 2016-07-20 MED ORDER — ONDANSETRON HCL 4 MG/2ML IJ SOLN
INTRAMUSCULAR | Status: AC
  Filled 2016-07-20: qty 2

## 2016-07-20 MED ORDER — LIDOCAINE 2% (20 MG/ML) 5 ML SYRINGE
INTRAMUSCULAR | Status: AC
  Filled 2016-07-20: qty 5

## 2016-07-20 MED ORDER — LIDOCAINE 2% (20 MG/ML) 5 ML SYRINGE
INTRAMUSCULAR | Status: DC | PRN
  Administered 2016-07-20: 100 mg via INTRAVENOUS

## 2016-07-20 MED ORDER — PROPOFOL 500 MG/50ML IV EMUL
INTRAVENOUS | Status: DC | PRN
  Administered 2016-07-20: 150 ug/kg/min via INTRAVENOUS

## 2016-07-20 MED ORDER — HEPARIN SOD (PORK) LOCK FLUSH 100 UNIT/ML IV SOLN
500.0000 [IU] | INTRAVENOUS | Status: AC | PRN
  Administered 2016-07-20: 500 [IU]

## 2016-07-20 MED ORDER — SODIUM CHLORIDE 0.9 % IV SOLN: INTRAVENOUS | Status: DC

## 2016-07-20 MED ORDER — ONDANSETRON HCL 4 MG/2ML IJ SOLN
INTRAMUSCULAR | Status: DC | PRN
  Administered 2016-07-20: 4 mg via INTRAVENOUS

## 2016-07-20 NOTE — Anesthesia Preprocedure Evaluation (Signed)
Anesthesia Evaluation  Patient identified by MRN, date of birth, ID band Patient awake    Reviewed: Allergy & Precautions, H&P , NPO status , Patient's Chart, lab work & pertinent test results  History of Anesthesia Complications (+) PONV and history of anesthetic complications  Airway Mallampati: II   Neck ROM: full    Dental   Pulmonary neg pulmonary ROS,    breath sounds clear to auscultation       Cardiovascular negative cardio ROS   Rhythm:regular Rate:Normal     Neuro/Psych  Headaches,    GI/Hepatic GERD  ,Colon CA   Endo/Other    Renal/GU      Musculoskeletal   Abdominal   Peds  Hematology   Anesthesia Other Findings   Reproductive/Obstetrics                             Anesthesia Physical Anesthesia Plan  ASA: II  Anesthesia Plan: MAC   Post-op Pain Management:    Induction: Intravenous  PONV Risk Score and Plan: 3 and Ondansetron, Dexamethasone, Propofol, Midazolam and Treatment may vary due to age or medical condition  Airway Management Planned: Nasal Cannula  Additional Equipment:   Intra-op Plan:   Post-operative Plan:   Informed Consent: I have reviewed the patients History and Physical, chart, labs and discussed the procedure including the risks, benefits and alternatives for the proposed anesthesia with the patient or authorized representative who has indicated his/her understanding and acceptance.     Plan Discussed with: CRNA and Anesthesiologist  Anesthesia Plan Comments:         Anesthesia Quick Evaluation

## 2016-07-20 NOTE — Transfer of Care (Signed)
Immediate Anesthesia Transfer of Care Note  Patient: Devorah L Fowles  Procedure(s) Performed: Procedure(s): UPPER ENDOSCOPIC ULTRASOUND (EUS) RADIAL (N/A)  Patient Location: PACU and Endoscopy Unit  Anesthesia Type:MAC  Level of Consciousness: awake, drowsy and responds to stimulation  Airway & Oxygen Therapy: Patient Spontanous Breathing and Patient connected to nasal cannula oxygen  Post-op Assessment: Report given to RN and Post -op Vital signs reviewed and stable  Post vital signs: Reviewed and stable  Last Vitals:  Vitals:   07/20/16 0927 07/20/16 1127  BP: (!) 164/98 (!) 163/91  Pulse: 78 76  Resp: (!) 22 (!) 28  Temp: 36.8 C 36.7 C    Last Pain:  Vitals:   07/20/16 1127  TempSrc: Oral         Complications: No apparent anesthesia complications

## 2016-07-20 NOTE — Discharge Instructions (Signed)
Esophagogastroduodenoscopy, Care After °Refer to this sheet in the next few weeks. These instructions provide you with information about caring for yourself after your procedure. Your health care provider may also give you more specific instructions. Your treatment has been planned according to current medical practices, but problems sometimes occur. Call your health care provider if you have any problems or questions after your procedure. °What can I expect after the procedure? °After the procedure, it is common to have: °· A sore throat. °· Nausea. °· Bloating. °· Dizziness. °· Fatigue. ° °Follow these instructions at home: °· Do not eat or drink anything until the numbing medicine (local anesthetic) has worn off and your gag reflex has returned. You will know that the local anesthetic has worn off when you can swallow comfortably. °· Do not drive for 24 hours if you received a medicine to help you relax (sedative). °· If your health care provider took a tissue sample for testing during the procedure, make sure to get your test results. This is your responsibility. Ask your health care provider or the department performing the test when your results will be ready. °· Keep all follow-up visits as told by your health care provider. This is important. °Contact a health care provider if: °· You cannot stop coughing. °· You are not urinating. °· You are urinating less than usual. °Get help right away if: °· You have trouble swallowing. °· You cannot eat or drink. °· You have throat or chest pain that gets worse. °· You are dizzy or light-headed. °· You faint. °· You have nausea or vomiting. °· You have chills. °· You have a fever. °· You have severe abdominal pain. °· You have black, tarry, or bloody stools. °This information is not intended to replace advice given to you by your health care provider. Make sure you discuss any questions you have with your health care provider. °Document Released: 12/06/2011 Document  Revised: 05/27/2015 Document Reviewed: 11/12/2014 °Elsevier Interactive Patient Education © 2018 [employer]. ° °

## 2016-07-20 NOTE — Op Note (Signed)
[facility] Patient Name: Brenda Becker Procedure Date: 07/20/2016 MRN: [id number] Attending MD: Milus Banister , MD Date of Birth: 21-May-1970 CSN: [id number] Age: 46 Admit Type: Outpatient Procedure:                Upper EUS Indications:              Abnormal abdominal PET scan; PET avid                            paraesophageal, posterior mediastinal mass in                            setting of known metastatic colon cancer Providers:                Milus Banister, MD, Carmie End, RN, Cherylynn Ridges, Technician, Christell Faith, CRNA Referring MD:             Julieanne Manson, MD Medicines:                Monitored Anesthesia Care Complications:            No immediate complications. Estimated blood loss:                            None. Estimated Blood Loss:     Estimated blood loss: none. Procedure:                Pre-Anesthesia Assessment:                           - Prior to the procedure, a History and Physical                            was performed, and patient medications and                            allergies were reviewed. The patient's tolerance of                            previous anesthesia was also reviewed. The risks                            and benefits of the procedure and the sedation                            options and risks were discussed with the patient.                            All questions were answered, and informed consent                            was obtained. Prior Anticoagulants: The patient has  taken no previous anticoagulant or antiplatelet                            agents. ASA Grade Assessment: II - A patient with                            mild systemic disease. After reviewing the risks                            and benefits, the patient was deemed in                            satisfactory condition to undergo the procedure.                           After  obtaining informed consent, the endoscope was                            passed under direct vision. Throughout the                            procedure, the patient's blood pressure, pulse, and                            oxygen saturations were monitored continuously. The                            [id number] ([id number]) scope was introduced through                            the mouth, and advanced to the body of the stomach.                            The upper EUS was accomplished without difficulty.                            The patient tolerated the procedure well. Scope In: Scope Out: Findings:      Endoscopic Finding :      The examined esophagus was endoscopically normal.      The entire examined stomach was endoscopically normal.      Endosonographic Finding (targeted evaluation for tissue acquisition). :      1. There were two similar sized irregular paraesophageal masses       identified endosonographically in the posterior mediastinum, adjacent to       esophagus with the ultrasound probe positioned 28 cm from the incisors.       The masses was hyperechoic and heterogenous, would unusual appearing for       adenopathy. The masses measured 2cm in maximal cross-sectional diameter.       The endosonographic borders were poorly-defined. Fine needle aspiration       for cytology was performed. Color Doppler imaging was utilized prior to       needle puncture to confirm a lack of significant vascular structures       within the needle path. Three passes were made  with the 25 gauge needle       using a transesophageal approach. A cytotechnologist was present to       evaluate the adequacy of the specimen. Final cytology results are       pending. Impression:               - Posterior mediastinal, paraesophageal PET avid                            masses in setting of known metastatic colon cancer.                            s/p FNA today. Preliminary cytology review is                             positive for malignancy, await final path results. Moderate Sedation:      N/A- Per Anesthesia Care Recommendation:           - Discharge patient to home (ambulatory).                           - Await pathology results. Procedure Code(s):        --- Professional ---                           [phone], 52, Esophagogastroduodenoscopy, flexible,                            transoral; with transendoscopic ultrasound-guided                            intramural or transmural fine needle                            aspiration/biopsy(s), (includes endoscopic                            ultrasound examination limited to the esophagus,                            stomach or duodenum, and adjacent structures) Diagnosis Code(s):        --- Professional ---                           J98.59, Other diseases of mediastinum, not                            elsewhere classified                           R93.5, Abnormal findings on diagnostic imaging of                            other abdominal regions, including retroperitoneum CPT copyright 2016 American Medical Association. All rights reserved. The codes documented in this report are preliminary and upon coder review may  be revised to meet current compliance requirements. Milus Banister, MD 07/20/2016  11:45:56 AM This report has been signed electronically. Number of Addenda: 0

## 2016-07-20 NOTE — H&P (Signed)
HPI: This is a very pleasant 46 yo woman  Chief complaint is metastatic colon cancer, PET avid nodule in chest, next to the esophagus.    ROS: complete GI ROS as described in HPI, all other review negative.  Constitutional:  No unintentional weight loss   Past Medical History:  Diagnosis Date  . Cancer [facility]) 2016   colon ca surgery and chemo  . Colon cancer ([place])   . Complication of anesthesia    N/V  . GERD (gastroesophageal reflux disease)   . Headache   . PONV (postoperative nausea and vomiting)    vomit with Gallbladder surgery    Past Surgical History:  Procedure Laterality Date  . BREAST BIOPSY    . CHOLECYSTECTOMY    . COLON SURGERY  2016   cancer  . EUS N/A 02/17/2016   Procedure: UPPER ENDOSCOPIC ULTRASOUND (EUS) RADIAL;  Surgeon: Milus Banister, MD;  Location: [facility] ENDOSCOPY;  Service: Endoscopy;  Laterality: N/A;  . KNEE ARTHROSCOPY Right   . TONSILLECTOMY  early 20's    Current Facility-Administered Medications  Medication Dose Route Frequency Provider Last Rate Last Dose  . 0.9 %  sodium chloride infusion   Intravenous Continuous Milus Banister, MD      . lactated ringers infusion   Intravenous Continuous Milus Banister, MD 125 mL/hr at 07/20/16 0951 1,000 mL at 07/20/16 0951  . sodium chloride flush (NS) 0.9 % injection 10-40 mL  10-40 mL Intracatheter PRN Milus Banister, MD       Facility-Administered Medications Ordered in Other Encounters  Medication Dose Route Frequency Provider Last Rate Last Dose  . sodium chloride flush (NS) 0.9 % injection 10 mL  10 mL Intravenous PRN Ladell Pier, MD   10 mL at 05/28/15 0956    Allergies as of 06/30/2016  . (No Known Allergies)    Family History  Problem Relation Age of Onset  . Other Father        doesn't go to doctor or get colonoscopies  . Other Brother        stomach issues; has had early colonoscopy  . Other Maternal Aunt        won't go to doctor; probably has not had colonoscopy  . Cancer  Cousin        "some kind of stomach cancer" dx. early 54s  . Skin cancer Maternal Grandmother        non-melanoma; dx. late 60s  . Prostate cancer Maternal Grandfather        dx. late 21s  . Stomach cancer Other        dx. "older"  . Colon cancer Other        dx. 3s  . Cancer Other        "female cancer" dx. "later age";   Marland Kitchen Breast cancer Other        dx. 50s-60s  . Breast cancer Paternal Grandmother        dx. 38s  . Cervical cancer Paternal Aunt 75       [facility] at [facility]  . Throat cancer Paternal Aunt 3       smoker    Social History   Social History  . Marital status: Married    Spouse name: N/A  . Number of children: N/A  . Years of education: N/A   Occupational History  . Not on file.   Social History Main Topics  . Smoking status: Never Smoker  . Smokeless tobacco: Never  Used  . Alcohol use No  . Drug use: No  . Sexual activity: Yes   Other Topics Concern  . Not on file   Social History Narrative  . No narrative on file     Physical Exam: BP (!) 164/98   Pulse 78   Temp 98.2 F (36.8 C) (Oral)   Resp (!) 22   Ht 5' 4.5" (1.638 m)   Wt 228 lb (103.4 kg)   LMP 07/11/2016 Comment: Per pt. no chance of pregnancy  SpO2 100%   BMI 38.53 kg/m  Constitutional: generally well-appearing Psychiatric: alert and oriented x3 Abdomen: soft, nontender, nondistended, no obvious ascites, no peritoneal signs, normal bowel sounds No peripheral edema noted in lower extremities  Assessment and plan: 46 y.o. female with metastatic colon cancer, PET avid posterior mediastinal nodule   for upper EUS with FNA today.  Please see the "Patient Instructions" section for addition details about the plan.  Owens Loffler, MD [facility] 07/20/2016, 10:27 AM

## 2016-07-20 NOTE — Anesthesia Procedure Notes (Signed)
Procedure Name: MAC Date/Time: 07/20/2016 10:47 AM Performed by: West Pugh Pre-anesthesia Checklist: Patient identified, Emergency Drugs available, Suction available, Patient being monitored and Timeout performed Patient Re-evaluated:Patient Re-evaluated prior to induction Oxygen Delivery Method: Nasal cannula Placement Confirmation: positive ETCO2,  CO2 detector and breath sounds checked- equal and bilateral Dental Injury: Teeth and Oropharynx as per pre-operative assessment

## 2016-07-21 ENCOUNTER — Encounter (HOSPITAL_COMMUNITY): Payer: Self-pay | Admitting: Gastroenterology

## 2016-07-21 NOTE — Anesthesia Postprocedure Evaluation (Signed)
Anesthesia Post Note  Patient: Brenda Becker  Procedure(s) Performed: Procedure(s) (LRB): UPPER ENDOSCOPIC ULTRASOUND (EUS) RADIAL (N/A)     Patient location during evaluation: PACU Anesthesia Type: MAC Level of consciousness: awake and alert Pain management: pain level controlled Vital Signs Assessment: post-procedure vital signs reviewed and stable Respiratory status: spontaneous breathing, nonlabored ventilation, respiratory function stable and patient connected to nasal cannula oxygen Cardiovascular status: stable and blood pressure returned to baseline Anesthetic complications: no    Last Vitals:  Vitals:   07/20/16 1155 07/20/16 1200  BP:    Pulse: 64 69  Resp: (!) 22 (!) 6  Temp:      Last Pain:  Vitals:   07/20/16 1127  TempSrc: Oral                 [name] S

## 2016-07-25 ENCOUNTER — Telehealth: Payer: Self-pay | Admitting: Oncology

## 2016-07-25 ENCOUNTER — Ambulatory Visit (HOSPITAL_BASED_OUTPATIENT_CLINIC_OR_DEPARTMENT_OTHER): Payer: PRIVATE HEALTH INSURANCE | Admitting: Oncology

## 2016-07-25 VITALS — BP 157/84 | HR 86 | Temp 98.4°F | Resp 18 | Ht 65.4 in | Wt 230.3 lb

## 2016-07-25 DIAGNOSIS — C779 Secondary and unspecified malignant neoplasm of lymph node, unspecified: Secondary | ICD-10-CM | POA: Diagnosis not present

## 2016-07-25 DIAGNOSIS — C184 Malignant neoplasm of transverse colon: Secondary | ICD-10-CM | POA: Diagnosis not present

## 2016-07-25 NOTE — Progress Notes (Signed)
[place] OFFICE PROGRESS NOTE   Diagnosis: Colon cancer  INTERVAL HISTORY:   Brenda Becker returns as scheduled. She she underwent an endoscopic ultrasound by Dr. [name] on 07/20/2016. 2 irregular Mediastinal masses were noted measuring 2 cm. An FNA biopsy was performed. The pathology revealed malignant cells consistent with metastatic adenocarcinoma.  She reports tolerating procedure well. She feels well. No complaint.  Objective:  Vital signs in last 24 hours:  Blood pressure (!) 157/84, pulse 86, temperature 98.4 F (36.9 C), temperature source Oral, resp. rate 18, height 5' 5.4" (1.661 m), weight 230 lb 4.8 oz (104.5 kg), last menstrual period 07/11/2016, SpO2 100 %.   Resp: Lungs clear bilaterally Cardio: Regular rate and rhythm GI: No hepatomegaly, no mass, nontender Vascular: No leg edema   Portacath/PICC-without erythema  Lab Results:  Lab Results  Component Value Date   WBC 9.3 06/01/2016   HGB 13.4 06/01/2016   HCT 41.5 06/01/2016   MCV 90.0 06/01/2016   PLT 180 06/01/2016   NEUTROABS 5.9 06/01/2016    CMP     Component Value Date/Time   NA 142 06/01/2016 0925   K 3.9 06/01/2016 0925   CO2 23 06/01/2016 0925   GLUCOSE 117 06/01/2016 0925   BUN 6.9 (L) 06/01/2016 0925   CREATININE 0.9 06/01/2016 0925   CALCIUM 9.1 06/01/2016 0925   PROT 6.9 06/01/2016 0925   ALBUMIN 3.7 06/01/2016 0925   AST 22 06/01/2016 0925   ALT 32 06/01/2016 0925   ALKPHOS 102 06/01/2016 0925   BILITOT 0.37 06/01/2016 0925    Lab Results  Component Value Date   CEA1 1.86 06/01/2016     Medications: I have reviewed the patient's current medications.  Assessment/Plan: 1. 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  Cycle 2 CAPOX 01/22/2015  Cycle 3 CAPOX 02/12/2015  Cycle 4 CAPOX 03/05/2015  Cycle 5 CAPOX 03/26/2015  Cycle 6 CAPOX 04/16/2015  Cycle 7 CAPOX 05/07/2015 (oxaliplatin held due to progressive neuropathy)  Cycle 8 CAPOX 05/28/2015 (oxaliplatin held secondary to neuropathy)  CTs 11/01/2015-no significant change and an 11 mm upper abdomen mesenteric lymph node with partial calcification-treated metastasis?, No other evidence of metastatic disease  CTs 01/21/2016-mild interval progression of previously identified calcified nodal tissue in the upper central abdomen. Development of small adjacent lymph nodes in the interval.  EUS guided biopsy of the calcified upper abdominal mass on 02/17/2016 confirmed metastatic adenocarcinoma  Cycle 1 FOLFIRI/Avastin 03/09/2016  Cycle 2 FOLFIRI/Avastin 03/23/2016  Cycle 3 FOLFIRI/Avastin 04/06/2016 (Neulasta added)  Cycle 4 FOLFIRI/Avastin 04/20/2016  Cycle 5 FOLFIRI/Avastin 05/04/2016  Cycle 6 FOLFIRI/Avastin 05/18/2016  CT abdomen/pelvis 05/30/2016-new retroperitoneal lymphadenopathy in the para-aortic region with the largest node measuring 1.5 cm. Partially calcified mesenteric lymph node measures 12 mm as compared to 14 mm previously. Other scattered less than 1 cmmesenteric lymph nodes remain stable.  PET scan 06/13/2016-hypermetabolic posterior mediastinal node, hypermetabolic retroperitoneal lymph nodes, stable transverse mesocolon mass, bilateral hypermetabolic inguinal nodes  EUS biopsy of posterior mediastinal lymph nodes on 07/20/2016-positive for metastatic adenocarcinoma 2. History of Microcytic anemia-likely iron deficiency anemia secondary to #1 and ongoing menses 3. Status post cholecystectomy 4. Family history of breast and ovarian cancer. She has seen the genetics counselor. 5. Delayed nausea following cycle 1 CAPOX. Aloxi and  Emend added beginning with cycle  2. 6. Transient episodes of dyspnea following cycle 4 and cycle 5 CAPOX, likely neurotoxicity from oxaliplatin 7. Oxaliplatin neuropathy-improved 8. Mild neutropenia secondary to chemotherapy-Neulasta added beginning with cycle 3 FOLFIRI/Avastin 9. Mild mucositis following cycle 4 FOLFIRI/Avastin 10. Mammogram 05/11/2016-possible mass right breast. Right breast ultrasound 05/17/2016-right breast mass at 10:00 most likely represents a cyst, however is somewhat hypoechoic and could also represent a fibroadenoma. Ultrasound-guided aspiration of a 1 cmbenign cyst 05/22/2016.  Disposition:  Brenda Becker appears stable and asymptomatic from the metastatic colon cancer. Biopsy of the knees down lymph node confirmed metastatic disease. She will not be a resection candidate. The tumor is MSI-stable and RAS wild-type.  We discussed treatment options including observation, standard irinotecan/panitumumab, and referral for a clinical trial. She appears to be eligible for several clinical trials at [facility] for patients with RAS wild-type tumors. I will refer her to Dr. Aleatha Borer to consider enrollment on a clinical trial.  25 minutes were spent with the patient today. The majority of the time was used for counseling and coordination of care.  She will return for an office visit and Port-A-Cath flush in approximately 4 weeks.  Donneta Romberg, MD  07/25/2016  11:14 AM

## 2016-07-25 NOTE — Telephone Encounter (Signed)
Scheduled appt per 7/24 los- Gave patient AVS and calender per los. - referral sent to [facility] in HIM .

## 2016-08-02 ENCOUNTER — Telehealth: Payer: Self-pay | Admitting: Oncology

## 2016-08-02 NOTE — Telephone Encounter (Signed)
PT APPT WITH DR MCREE IS 08/23/16 @9 :30 CALLED PT LEFT VM IN REF TO APPT.

## 2016-08-03 ENCOUNTER — Telehealth: Payer: Self-pay | Admitting: *Deleted

## 2016-08-03 NOTE — Telephone Encounter (Signed)
Message to schedulers to change appt.

## 2016-08-03 NOTE — Telephone Encounter (Signed)
Ok 12:30 8/24

## 2016-08-03 NOTE — Telephone Encounter (Signed)
Message from pt reporting [facility] appt is scheduled for 8/22. Requesting to move office visit to Friday 8/24 (her day off). Will review with MD.

## 2016-08-23 ENCOUNTER — Ambulatory Visit: Payer: PRIVATE HEALTH INSURANCE | Admitting: Oncology

## 2016-08-25 ENCOUNTER — Ambulatory Visit: Payer: PRIVATE HEALTH INSURANCE

## 2016-08-25 ENCOUNTER — Telehealth: Payer: Self-pay | Admitting: Nurse Practitioner

## 2016-08-25 ENCOUNTER — Ambulatory Visit (HOSPITAL_BASED_OUTPATIENT_CLINIC_OR_DEPARTMENT_OTHER): Payer: PRIVATE HEALTH INSURANCE | Admitting: Oncology

## 2016-08-25 VITALS — BP 151/95 | HR 79 | Temp 98.6°F | Resp 18 | Ht 65.4 in | Wt 230.4 lb

## 2016-08-25 DIAGNOSIS — C779 Secondary and unspecified malignant neoplasm of lymph node, unspecified: Secondary | ICD-10-CM

## 2016-08-25 DIAGNOSIS — C184 Malignant neoplasm of transverse colon: Secondary | ICD-10-CM

## 2016-08-25 DIAGNOSIS — Z95828 Presence of other vascular implants and grafts: Secondary | ICD-10-CM

## 2016-08-25 MED ORDER — HEPARIN SOD (PORK) LOCK FLUSH 100 UNIT/ML IV SOLN
500.0000 [IU] | Freq: Once | INTRAVENOUS | Status: AC | PRN
  Administered 2016-08-25: 500 [IU] via INTRAVENOUS
  Filled 2016-08-25: qty 5

## 2016-08-25 MED ORDER — SODIUM CHLORIDE 0.9 % IJ SOLN
10.0000 mL | INTRAMUSCULAR | Status: DC | PRN
  Administered 2016-08-25: 10 mL via INTRAVENOUS
  Filled 2016-08-25: qty 10

## 2016-08-25 NOTE — Patient Instructions (Signed)

## 2016-08-25 NOTE — Telephone Encounter (Signed)
Gave avs and calendar to patient for November appointment

## 2016-08-25 NOTE — Progress Notes (Signed)
[place] OFFICE PROGRESS NOTE   Diagnosis: Colon cancer  INTERVAL HISTORY:   [name] returns as scheduled. She saw Dr. Aleatha Borer on 08/23/2016. She has decided to enroll on a clinical trial at [facility]. [name] feels well. No complaint.  Objective:  Vital signs in last 24 hours:  Blood pressure (!) 151/95, pulse 79, temperature 98.6 F (37 C), temperature source Oral, resp. rate 18, height 5' 5.4" (1.661 m), weight 230 lb 6.4 oz (104.5 kg), SpO2 100 %.    HEENT: Neck without mass Resp: Lungs clear bilaterally Cardio: Regular rate and rhythm GI: No hepatosplenomegaly, nontender, no mass Vascular: No leg edema   Portacath/PICC-without erythema  Lab Results:  Lab Results  Component Value Date   WBC 9.3 06/01/2016   HGB 13.4 06/01/2016   HCT 41.5 06/01/2016   MCV 90.0 06/01/2016   PLT 180 06/01/2016   NEUTROABS 5.9 06/01/2016    CMP     Component Value Date/Time   NA 142 06/01/2016 0925   K 3.9 06/01/2016 0925   CO2 23 06/01/2016 0925   GLUCOSE 117 06/01/2016 0925   BUN 6.9 (L) 06/01/2016 0925   CREATININE 0.9 06/01/2016 0925   CALCIUM 9.1 06/01/2016 0925   PROT 6.9 06/01/2016 0925   ALBUMIN 3.7 06/01/2016 0925   AST 22 06/01/2016 0925   ALT 32 06/01/2016 0925   ALKPHOS 102 06/01/2016 0925   BILITOT 0.37 06/01/2016 0925    Lab Results  Component Value Date   CEA1 1.86 06/01/2016    Medications: I have reviewed the patient's current medications.  Assessment/Plan: 1. 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  Cycle 2 CAPOX 01/22/2015  Cycle 3 CAPOX 02/12/2015  Cycle 4 CAPOX 03/05/2015  Cycle 5 CAPOX 03/26/2015  Cycle 6 CAPOX  04/16/2015  Cycle 7 CAPOX 05/07/2015 (oxaliplatin held due to progressive neuropathy)  Cycle 8 CAPOX 05/28/2015 (oxaliplatin held secondary to neuropathy)  CTs 11/01/2015-no significant change and an 11 mm upper abdomen mesenteric lymph node with partial calcification-treated metastasis?, No other evidence of metastatic disease  CTs 01/21/2016-mild interval progression of previously identified calcified nodal tissue in the upper central abdomen. Development of small adjacent lymph nodes in the interval.  EUS guided biopsy of the calcified upper abdominal mass on 02/17/2016 confirmed metastatic adenocarcinoma  Cycle 1 FOLFIRI/Avastin 03/09/2016  Cycle 2 FOLFIRI/Avastin 03/23/2016  Cycle 3 FOLFIRI/Avastin 04/06/2016 (Neulasta added)  Cycle 4 FOLFIRI/Avastin 04/20/2016  Cycle 5 FOLFIRI/Avastin 05/04/2016  Cycle 6 FOLFIRI/Avastin 05/18/2016  CT abdomen/pelvis 05/30/2016-new retroperitoneal lymphadenopathy in the para-aortic region with the largest node measuring 1.5 cm. Partially calcified mesenteric lymph node measures 12 mm as compared to 14 mm previously. Other scattered less than 1 cmmesenteric lymph nodes remain stable.  PET scan 06/13/2016-hypermetabolic posterior mediastinal node, hypermetabolic retroperitoneal lymph nodes, stable transverse mesocolon mass, bilateral hypermetabolic inguinal nodes  EUS biopsy of posterior mediastinal lymph nodes on 07/20/2016-positive for metastatic adenocarcinoma 2. History of Microcytic anemia-likely iron deficiency anemia secondary to #1 and ongoing menses 3. Status post cholecystectomy 4. Family history of breast and ovarian cancer. She has seen the genetics counselor. 5. Delayed nausea following cycle 1 CAPOX. Aloxi and Emend added beginning with cycle 2. 6. Transient episodes of dyspnea following cycle 4 and cycle 5 CAPOX, likely neurotoxicity from oxaliplatin  7. Oxaliplatin neuropathy-improved 8. Mild neutropenia secondary to  chemotherapy-Neulasta added beginning with cycle 3 FOLFIRI/Avastin 9. Mild mucositis following cycle 4 FOLFIRI/Avastin 10. Mammogram 05/11/2016-possible mass right breast. Right breast ultrasound 05/17/2016-right breast mass at 10:00 most likely represents a cyst, however is somewhat hypoechoic and could also represent a fibroadenoma. Ultrasound-guided aspiration of a 1 cmbenign cyst 05/22/2016.    Disposition:  Brenda Becker appears unchanged. She has metastatic colon cancer. She is asymptomatic. She has decided to enroll on a clinical trial at [facility] with panitumumab,ipilumumab, and nivolumab. She has reviewed the potential toxicities associated with these agents with the [facility] research team and myself today. She plans to begin treatment on 09/15/2016. We discussed standard treatment options with a panitumumab-based regimen.   She will return for an office visit here in approximately 3 months. I am available to see her sooner as needed.  Donneta Romberg, MD  08/25/2016  1:45 PM

## 2016-11-17 ENCOUNTER — Ambulatory Visit (HOSPITAL_BASED_OUTPATIENT_CLINIC_OR_DEPARTMENT_OTHER): Payer: PRIVATE HEALTH INSURANCE | Admitting: Nurse Practitioner

## 2016-11-17 ENCOUNTER — Encounter: Payer: Self-pay | Admitting: Nurse Practitioner

## 2016-11-17 ENCOUNTER — Telehealth: Payer: Self-pay | Admitting: Oncology

## 2016-11-17 VITALS — BP 126/85 | HR 90 | Temp 98.5°F | Resp 20 | Ht 65.5 in | Wt 231.9 lb

## 2016-11-17 DIAGNOSIS — R110 Nausea: Secondary | ICD-10-CM

## 2016-11-17 DIAGNOSIS — C779 Secondary and unspecified malignant neoplasm of lymph node, unspecified: Secondary | ICD-10-CM

## 2016-11-17 DIAGNOSIS — C184 Malignant neoplasm of transverse colon: Secondary | ICD-10-CM

## 2016-11-17 DIAGNOSIS — R21 Rash and other nonspecific skin eruption: Secondary | ICD-10-CM

## 2016-11-17 DIAGNOSIS — C189 Malignant neoplasm of colon, unspecified: Secondary | ICD-10-CM

## 2016-11-17 MED ORDER — PROCHLORPERAZINE MALEATE 10 MG PO TABS: 10.0000 mg | ORAL_TABLET | Freq: Four times a day (QID) | ORAL | 1 refills | Status: DC | PRN

## 2016-11-17 NOTE — Telephone Encounter (Signed)
Gave avs and calendar for February 2019 °

## 2016-11-17 NOTE — Progress Notes (Addendum)
[place] OFFICE PROGRESS NOTE   Diagnosis: Colon cancer  INTERVAL HISTORY:   Brenda Becker returns as scheduled.  She is currently being treated on a clinical trial at [facility].  She overall is feeling well.  She has intermittent nausea.  She had a recent episode of vomiting.  She does not have an antiemetic at home.  She continues to have a good appetite.  She denies pain.  No diarrhea.  She notes a rash on her face and trunk.  The rash "comes and goes".  She is taking doxycycline, uses a topical over-the-counter steroid cream and lotion.  Objective:  Vital signs in last 24 hours:  Blood pressure 126/85, pulse 90, temperature 98.5 F (36.9 C), resp. rate 20, height 5' 5.5" (1.664 m), weight 231 lb 14.4 oz (105.2 kg), SpO2 100 %.    HEENT: No thrush or ulcers. Resp: Lungs clear bilaterally. Cardio: Regular rate and rhythm. GI: Abdomen soft and nontender.  No hepatomegaly. Vascular: No leg edema.  Skin: Mild acne type rash over the face.  Dry appearing flat erythematous rash over the trunk. Port-A-Cath without erythema.   Lab Results:  Lab Results  Component Value Date   WBC 9.3 06/01/2016   HGB 13.4 06/01/2016   HCT 41.5 06/01/2016   MCV 90.0 06/01/2016   PLT 180 06/01/2016   NEUTROABS 5.9 06/01/2016    Imaging:  No results found.  Medications: I have reviewed the patient's current medications.  Assessment/Plan: 1. 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  Cycle 2 CAPOX 01/22/2015  Cycle 3 CAPOX 02/12/2015  Cycle 4 CAPOX 03/05/2015  Cycle 5 CAPOX 03/26/2015  Cycle 6 CAPOX 04/16/2015  Cycle 7 CAPOX 05/07/2015 (oxaliplatin held due to progressive  neuropathy)  Cycle 8 CAPOX 05/28/2015 (oxaliplatin held secondary to neuropathy)  CTs 11/01/2015-no significant change and an 11 mm upper abdomen mesenteric lymph node with partial calcification-treated metastasis?, No other evidence of metastatic disease  CTs 01/21/2016-mild interval progression of previously identified calcified nodal tissue in the upper central abdomen. Development of small adjacent lymph nodes in the interval.  EUS guided biopsy of the calcified upper abdominal mass on 02/17/2016 confirmed metastatic adenocarcinoma  Cycle 1 FOLFIRI/Avastin 03/09/2016  Cycle 2 FOLFIRI/Avastin 03/23/2016  Cycle 3 FOLFIRI/Avastin 04/06/2016 (Neulasta added)  Cycle 4 FOLFIRI/Avastin 04/20/2016  Cycle 5 FOLFIRI/Avastin 05/04/2016  Cycle 6 FOLFIRI/Avastin 05/18/2016  CT abdomen/pelvis 05/30/2016-new retroperitoneal lymphadenopathy in the para-aortic region with the largest node measuring 1.5 cm. Partially calcified mesenteric lymph node measures 12 mm as compared to 14 mm previously. Other scattered less than 1 cmmesenteric lymph nodes remain stable.  PET scan 06/13/2016-hypermetabolic posterior mediastinal node, hypermetabolic retroperitoneal lymph nodes, stable transverse mesocolon mass, bilateral hypermetabolic inguinal nodes  EUS biopsy of posterior mediastinal lymph nodes on 07/20/2016-positive for metastatic adenocarcinoma  Clinical trial at [facility] with Panitumumab, nivolumab, ipilimumab. 2. History of Microcytic anemia-likely iron deficiency anemia secondary to #1 and ongoing menses 3. Status post cholecystectomy 4. Family history of breast and ovarian cancer. She has seen the genetics counselor. 5. Delayed nausea following cycle 1 CAPOX. Aloxi and Emend added beginning with cycle 2. 6. Transient episodes of dyspnea following cycle 4 and cycle 5 CAPOX, likely neurotoxicity from oxaliplatin 7. Oxaliplatin neuropathy-improved 8. Mild neutropenia secondary to chemotherapy-Neulasta  added  beginning with cycle 3 FOLFIRI/Avastin 9. Mild mucositis following cycle 4 FOLFIRI/Avastin 10. Mammogram 05/11/2016-possible mass right breast. Right breast ultrasound 05/17/2016-right breast mass at 10:00 most likely represents a cyst, however is somewhat hypoechoic and could also represent a fibroadenoma. Ultrasound-guided aspiration of a 1 cmbenign cyst 05/22/2016.    Disposition: Brenda Becker appears stable.  She continues treatment on clinical trial at [facility]  She is being followed closely there.  We scheduled a return visit here in 3 months.  She will contact the office in the interim with any problems.  She is having intermittent nausea.  We sent a prescription to her pharmacy for Compazine.  Patient seen with Dr. [name].  [name]   11/17/2016  9:47 AM This was a shared visit with Ned Card.  Brenda Becker is being treated on a clinical trial at [facility].  She appears well.  She will return for an office visit here in 3 months.  We are available to see her in the interim as needed.  Julieanne Manson, MD

## 2017-01-02 HISTORY — PX: BREAST BIOPSY: SHX20

## 2017-01-29 DIAGNOSIS — C189 Malignant neoplasm of colon, unspecified: Secondary | ICD-10-CM | POA: Insufficient documentation

## 2017-02-16 ENCOUNTER — Inpatient Hospital Stay: Payer: PRIVATE HEALTH INSURANCE | Attending: Oncology | Admitting: Oncology

## 2017-02-16 ENCOUNTER — Telehealth: Payer: Self-pay | Admitting: Oncology

## 2017-02-16 VITALS — BP 122/77 | HR 93 | Temp 98.5°F | Resp 17 | Ht 65.5 in | Wt 218.8 lb

## 2017-02-16 DIAGNOSIS — C779 Secondary and unspecified malignant neoplasm of lymph node, unspecified: Secondary | ICD-10-CM

## 2017-02-16 DIAGNOSIS — C184 Malignant neoplasm of transverse colon: Secondary | ICD-10-CM

## 2017-02-16 DIAGNOSIS — R21 Rash and other nonspecific skin eruption: Secondary | ICD-10-CM

## 2017-02-16 DIAGNOSIS — C189 Malignant neoplasm of colon, unspecified: Secondary | ICD-10-CM

## 2017-02-16 NOTE — Progress Notes (Signed)
[place] OFFICE PROGRESS NOTE   Diagnosis: Colon cancer  INTERVAL HISTORY:   Brenda Becker returns as scheduled.  She reports developing a cold 2 months ago.  She has a persistent cough and hoarseness.  No fever or dyspnea. She enrolled on a clinical trial at [facility] with panitumumab, nivolumab, and ipilimumab.  She completed cycle 4 treatment this week.  She reports tolerating the therapy well.  No diarrhea.  She has developed a rash surrounding the Port-A-Cath.  She is concerned this is related to the Port-A-Cath dressing.  She is taking minocycline and magnesium.  She continues to work.  Objective:  Vital signs in last 24 hours:  Blood pressure 122/77, pulse 93, temperature 98.5 F (36.9 C), temperature source Oral, resp. rate 17, height 5' 5.5" (1.664 m), weight 218 lb 12.8 oz (99.2 kg), SpO2 100 %.    HEENT: No thrush or ulcers Resp: Lungs clear bilaterally, no respiratory distress Cardio: Regular rate and rhythm GI: No hepatomegaly, nontender Vascular: No leg edema  Skin: Acne type rash over the face and upper chest/back.  Portacath/PICC-mild erythema and hyperpigmentation in a tape distribution surrounding the Port-A-Cath   Medications: I have reviewed the patient's current medications.   Assessment/Plan:  1. 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  Cycle 2 CAPOX 01/22/2015  Cycle 3 CAPOX 02/12/2015  Cycle 4 CAPOX 03/05/2015  Cycle 5 CAPOX 03/26/2015  Cycle 6 CAPOX 04/16/2015  Cycle 7 CAPOX 05/07/2015 (oxaliplatin held due to progressive neuropathy)  Cycle 8 CAPOX 05/28/2015 (oxaliplatin held secondary to neuropathy)  CTs 11/01/2015-no significant  change and an 11 mm upper abdomen mesenteric lymph node with partial calcification-treated metastasis?, No other evidence of metastatic disease  CTs 01/21/2016-mild interval progression of previously identified calcified nodal tissue in the upper central abdomen. Development of small adjacent lymph nodes in the interval.  EUS guided biopsy of the calcified upper abdominal mass on 02/17/2016 confirmed metastatic adenocarcinoma  Cycle 1 FOLFIRI/Avastin 03/09/2016  Cycle 2 FOLFIRI/Avastin 03/23/2016  Cycle 3 FOLFIRI/Avastin 04/06/2016 (Neulasta added)  Cycle 4 FOLFIRI/Avastin 04/20/2016  Cycle 5 FOLFIRI/Avastin 05/04/2016  Cycle 6 FOLFIRI/Avastin 05/18/2016  CT abdomen/pelvis 05/30/2016-new retroperitoneal lymphadenopathy in the para-aortic region with the largest node measuring 1.5 cm. Partially calcified mesenteric lymph node measures 12 mm as compared to 14 mm previously. Other scattered less than 1 cmmesenteric lymph nodes remain stable.  PET scan 06/13/2016-hypermetabolic posterior mediastinal node, hypermetabolic retroperitoneal lymph nodes, stable transverse mesocolon mass, bilateral hypermetabolic inguinal nodes  EUS biopsy of posterior mediastinal lymph nodes on 07/20/2016-positive for metastatic adenocarcinoma  Clinical trial at [facility] [place] with Panitumumab, nivolumab, ipilimumab.  Restaging CTs 12/18/2017- decreased size of right lower lobe nodules, mediastinal, retroperitoneal, and mesenteric lymph nodes consistent with a response to therapy 2. History of Microcytic anemia-likely iron deficiency anemia secondary to #1 and ongoing menses 3. Status post cholecystectomy 4. Family history of breast and ovarian cancer. She has seen the genetics counselor. 5. Delayed nausea following cycle 1 CAPOX. Aloxi and Emend added beginning with cycle 2. 6. Transient episodes of dyspnea following cycle 4 and cycle 5 CAPOX, likely neurotoxicity from oxaliplatin 7. Oxaliplatin  neuropathy-improved 8. Mild neutropenia secondary to chemotherapy-Neulasta added beginning with cycle 3 FOLFIRI/Avastin 9. Mild mucositis following cycle 4 FOLFIRI/Avastin 10. Mammogram 05/11/2016-possible  mass right breast. Right breast ultrasound 05/17/2016-right breast mass at 10:00 most likely represents a cyst, however is somewhat hypoechoic and could also represent a fibroadenoma. Ultrasound-guided aspiration of a 1 cmbenign cyst 05/22/2016.   Disposition: [name] has completed 4 cycles of treatment on a clinical trial at [facility].  A restaging CT in December revealed evidence of a response to therapy.  She continues treatment and close clinical follow-up at [facility].  I suspect the acne type rash over the chest is related to Panitumumab.  There appears to be a mild tape reaction surrounding the Port-A-Cath.  If the rash does not improve she will discuss use of fluticasone cream with Dr. Sigurd Sos.  She will return for office visit here in 3 months.  We are available to see her sooner as needed.  15 minutes were spent with the patient today.  The majority of the time was used for counseling and coordination of care.  Brenda Coder, MD  02/16/2017  12:32 PM

## 2017-02-16 NOTE — Telephone Encounter (Signed)
Scheduled appt per 2/15 los - patient is aware - my chart active.

## 2017-02-26 DIAGNOSIS — K759 Inflammatory liver disease, unspecified: Secondary | ICD-10-CM | POA: Insufficient documentation

## 2017-02-26 HISTORY — DX: Inflammatory liver disease, unspecified: K75.9

## 2017-03-15 ENCOUNTER — Other Ambulatory Visit: Payer: Self-pay | Admitting: Nurse Practitioner

## 2017-03-15 DIAGNOSIS — C189 Malignant neoplasm of colon, unspecified: Secondary | ICD-10-CM

## 2017-03-21 ENCOUNTER — Telehealth: Payer: Self-pay

## 2017-03-21 NOTE — Telephone Encounter (Signed)
Returned call to inform pt of appt date and times. Voiced understanding and appreciation

## 2017-03-22 ENCOUNTER — Other Ambulatory Visit: Payer: Self-pay | Admitting: Oncology

## 2017-03-22 DIAGNOSIS — C189 Malignant neoplasm of colon, unspecified: Secondary | ICD-10-CM

## 2017-03-22 DIAGNOSIS — Z7189 Other specified counseling: Secondary | ICD-10-CM | POA: Insufficient documentation

## 2017-03-29 ENCOUNTER — Inpatient Hospital Stay (HOSPITAL_BASED_OUTPATIENT_CLINIC_OR_DEPARTMENT_OTHER): Payer: PRIVATE HEALTH INSURANCE | Admitting: Nurse Practitioner

## 2017-03-29 ENCOUNTER — Inpatient Hospital Stay: Payer: PRIVATE HEALTH INSURANCE | Attending: Oncology

## 2017-03-29 ENCOUNTER — Inpatient Hospital Stay: Payer: PRIVATE HEALTH INSURANCE

## 2017-03-29 ENCOUNTER — Telehealth: Payer: Self-pay | Admitting: Nurse Practitioner

## 2017-03-29 ENCOUNTER — Encounter: Payer: Self-pay | Admitting: Nurse Practitioner

## 2017-03-29 VITALS — BP 134/82 | HR 95 | Temp 99.2°F | Resp 17 | Ht 65.5 in | Wt 219.1 lb

## 2017-03-29 DIAGNOSIS — Z5112 Encounter for antineoplastic immunotherapy: Secondary | ICD-10-CM | POA: Insufficient documentation

## 2017-03-29 DIAGNOSIS — C779 Secondary and unspecified malignant neoplasm of lymph node, unspecified: Secondary | ICD-10-CM

## 2017-03-29 DIAGNOSIS — C189 Malignant neoplasm of colon, unspecified: Secondary | ICD-10-CM

## 2017-03-29 DIAGNOSIS — K754 Autoimmune hepatitis: Secondary | ICD-10-CM

## 2017-03-29 DIAGNOSIS — C184 Malignant neoplasm of transverse colon: Secondary | ICD-10-CM

## 2017-03-29 DIAGNOSIS — E876 Hypokalemia: Secondary | ICD-10-CM | POA: Diagnosis not present

## 2017-03-29 DIAGNOSIS — Z95828 Presence of other vascular implants and grafts: Secondary | ICD-10-CM

## 2017-03-29 LAB — CBC WITH DIFFERENTIAL (CANCER CENTER ONLY)
BASOS ABS: 0.1 10*3/uL (ref 0.0–0.1)
BASOS PCT: 1 %
EOS ABS: 0 10*3/uL (ref 0.0–0.5)
Eosinophils Relative: 1 %
HCT: 37.3 % (ref 34.8–46.6)
HEMOGLOBIN: 12.4 g/dL (ref 11.6–15.9)
Lymphocytes Relative: 29 %
Lymphs Abs: 1.8 10*3/uL (ref 0.9–3.3)
MCH: 29.7 pg (ref 25.1–34.0)
MCHC: 33.2 g/dL (ref 31.5–36.0)
MCV: 89.5 fL (ref 79.5–101.0)
MONO ABS: 0.3 10*3/uL (ref 0.1–0.9)
MONOS PCT: 5 %
NEUTROS PCT: 64 %
Neutro Abs: 3.9 10*3/uL (ref 1.5–6.5)
Platelet Count: 225 10*3/uL (ref 145–400)
RBC: 4.17 MIL/uL (ref 3.70–5.45)
RDW: 15.2 % — AB (ref 11.2–14.5)
WBC Count: 6.1 10*3/uL (ref 3.9–10.3)

## 2017-03-29 LAB — CMP (CANCER CENTER ONLY)
ALBUMIN: 3.6 g/dL (ref 3.5–5.0)
ALT: 57 U/L — ABNORMAL HIGH (ref 0–55)
ANION GAP: 14 — AB (ref 3–11)
AST: 26 U/L (ref 5–34)
Alkaline Phosphatase: 80 U/L (ref 40–150)
BUN: 12 mg/dL (ref 7–26)
CO2: 28 mmol/L (ref 22–29)
Calcium: 8.7 mg/dL (ref 8.4–10.4)
Chloride: 102 mmol/L (ref 98–109)
Creatinine: 1.14 mg/dL — ABNORMAL HIGH (ref 0.60–1.10)
GFR, Est AFR Am: >60 mL/min (ref >60)
GFR, Estimated: 57 mL/min — ABNORMAL LOW (ref >60)
GLUCOSE: 176 mg/dL — AB (ref 70–140)
POTASSIUM: 2.8 mmol/L — AB (ref 3.5–5.1)
SODIUM: 144 mmol/L (ref 136–145)
Total Bilirubin: 0.5 mg/dL (ref 0.2–1.2)
Total Protein: 6.3 g/dL — ABNORMAL LOW (ref 6.4–8.3)

## 2017-03-29 LAB — MAGNESIUM: MAGNESIUM: 2.7 mg/dL — AB (ref 1.5–2.5)

## 2017-03-29 MED ORDER — SODIUM CHLORIDE 0.9 % IV SOLN
Freq: Once | INTRAVENOUS | Status: AC
  Administered 2017-03-29: 13:00:00 via INTRAVENOUS

## 2017-03-29 MED ORDER — SODIUM CHLORIDE 0.9 % IV SOLN
6.0000 mg/kg | Freq: Once | INTRAVENOUS | Status: AC
  Administered 2017-03-29: 600 mg via INTRAVENOUS
  Filled 2017-03-29: qty 20

## 2017-03-29 MED ORDER — SODIUM CHLORIDE 0.9 % IJ SOLN
10.0000 mL | INTRAMUSCULAR | Status: DC | PRN
  Administered 2017-03-29: 10 mL via INTRAVENOUS
  Filled 2017-03-29: qty 10

## 2017-03-29 MED ORDER — SODIUM CHLORIDE 0.9% FLUSH
10.0000 mL | INTRAVENOUS | Status: DC | PRN
  Administered 2017-03-29: 10 mL
  Filled 2017-03-29: qty 10

## 2017-03-29 MED ORDER — HEPARIN SOD (PORK) LOCK FLUSH 100 UNIT/ML IV SOLN
500.0000 [IU] | Freq: Once | INTRAVENOUS | Status: AC | PRN
  Administered 2017-03-29: 500 [IU]
  Filled 2017-03-29: qty 5

## 2017-03-29 MED ORDER — POTASSIUM CHLORIDE CRYS ER 20 MEQ PO TBCR: EXTENDED_RELEASE_TABLET | ORAL | 0 refills | Status: DC

## 2017-03-29 NOTE — Patient Instructions (Signed)
[facility] Discharge Instructions for Patients Receiving Chemotherapy  Today you received the following chemotherapy agents: Vectibix.  To help prevent nausea and vomiting after your treatment, we encourage you to take your nausea medication as directed.   If you develop nausea and vomiting that is not controlled by your nausea medication, call the clinic.   BELOW ARE SYMPTOMS THAT SHOULD BE REPORTED IMMEDIATELY:  *FEVER GREATER THAN 100.5 F  *CHILLS WITH OR WITHOUT FEVER  NAUSEA AND VOMITING THAT IS NOT CONTROLLED WITH YOUR NAUSEA MEDICATION  *UNUSUAL SHORTNESS OF BREATH  *UNUSUAL BRUISING OR BLEEDING  TENDERNESS IN MOUTH AND THROAT WITH OR WITHOUT PRESENCE OF ULCERS  *URINARY PROBLEMS  *BOWEL PROBLEMS  UNUSUAL RASH Items with * indicate a potential emergency and should be followed up as soon as possible.  Feel free to call the clinic should you have any questions or concerns. The clinic phone number is [phone].  Please show the CHEMO ALERT CARD at check-in to the Emergency Department and triage nurse.   

## 2017-03-29 NOTE — Telephone Encounter (Signed)
Scheduled appt per 3/28 los - Gave patient AVS and calender per los.  

## 2017-03-29 NOTE — Progress Notes (Addendum)
[place] OFFICE PROGRESS NOTE   Diagnosis: Colon cancer  INTERVAL HISTORY:   Brenda Becker returns for follow-up.  She overall is feeling well.  She is completing a steroid taper.  She notes her skin is dry.  She recently noted cracks in the skin at multiple nailbeds.  She has an acne type rash on her face.  No nausea, vomiting or diarrhea.  She denies pain.  Objective:  Vital signs in last 24 hours:  Blood pressure 134/82, pulse 95, temperature 99.2 F (37.3 C), temperature source Oral, resp. rate 17, height 5' 5.5" (1.664 m), weight 219 lb 1.6 oz (99.4 kg), SpO2 99 %.    HEENT: Patchy erythema at the posterior palate.  No thrush. Resp: Lungs clear bilaterally. Cardio: Regular rate and rhythm. GI: Abdomen soft and nontender.  No hepatomegaly. Vascular: No leg edema. Neuro: Alert and oriented. Skin: Acne type rash over the face.  Skin in general has a dry appearance.  Paronychia involving multiple nailbeds. Port-A-Cath without erythema.   Lab Results:  Lab Results  Component Value Date   WBC 6.1 03/29/2017   HGB 13.4 06/01/2016   HCT 37.3 03/29/2017   MCV 89.5 03/29/2017   PLT 225 03/29/2017   NEUTROABS 3.9 03/29/2017    Imaging:  No results found.  Medications: I have reviewed the patient's current medications.  Assessment/Plan: 1. 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  Cycle 2 CAPOX 01/22/2015  Cycle 3 CAPOX 02/12/2015  Cycle 4 CAPOX 03/05/2015  Cycle 5 CAPOX 03/26/2015  Cycle 6 CAPOX 04/16/2015  Cycle 7 CAPOX 05/07/2015 (oxaliplatin held due to progressive neuropathy)  Cycle 8 CAPOX 05/28/2015 (oxaliplatin held secondary to  neuropathy)  CTs 11/01/2015-no significant change and an 11 mm upper abdomen mesenteric lymph node with partial calcification-treated metastasis?, No other evidence of metastatic disease  CTs 01/21/2016-mild interval progression of previously identified calcified nodal tissue in the upper central abdomen. Development of small adjacent lymph nodes in the interval.  EUS guided biopsy of the calcified upper abdominal mass on 02/17/2016 confirmed metastatic adenocarcinoma  Cycle 1 FOLFIRI/Avastin 03/09/2016  Cycle 2 FOLFIRI/Avastin 03/23/2016  Cycle 3 FOLFIRI/Avastin 04/06/2016 (Neulasta added)  Cycle 4 FOLFIRI/Avastin 04/20/2016  Cycle 5 FOLFIRI/Avastin 05/04/2016  Cycle 6 FOLFIRI/Avastin 05/18/2016  CT abdomen/pelvis 05/30/2016-new retroperitoneal lymphadenopathy in the para-aortic region with the largest node measuring 1.5 cm. Partially calcified mesenteric lymph node measures 12 mm as compared to 14 mm previously. Other scattered less than 1 cmmesenteric lymph nodes remain stable.  PET scan 06/13/2016-hypermetabolic posterior mediastinal node, hypermetabolic retroperitoneal lymph nodes, stable transverse mesocolon mass, bilateral hypermetabolic inguinal nodes  EUS biopsy of posterior mediastinal lymph nodes on 07/20/2016-positive for metastatic adenocarcinoma  Clinical trial at [facility] [place] with Panitumumab, nivolumab, ipilimumab.  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  No change in small calcified mesenteric and retroperitoneal abdominal lymph nodes.  No evidence of metastatic disease in the chest.  Plan to continue maintenance Panitumumab. 2. History of  Microcytic anemia-likely iron deficiency anemia secondary to #  1 and ongoing menses 3. Status post cholecystectomy 4. Family history of breast and ovarian cancer. She has seen the genetics counselor. 5. Delayed nausea following cycle 1 CAPOX. Aloxi and Emend added beginning with cycle 2. 6. Transient episodes of dyspnea following cycle 4 and cycle 5 CAPOX, likely neurotoxicity from oxaliplatin 7. Oxaliplatin neuropathy-improved 8. Mild neutropenia secondary to chemotherapy-Neulasta added beginning with cycle 3 FOLFIRI/Avastin 9. Mild mucositis following cycle 4 FOLFIRI/Avastin 10. Mammogram 05/11/2016-possible mass right breast. Right breast ultrasound 05/17/2016-right breast mass at 10:00 most likely represents a cyst, however is somewhat hypoechoic and could also represent a fibroadenoma. Ultrasound-guided aspiration of a 1 cmbenign cyst 05/22/2016. 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      Disposition: Brenda Becker appears stable.  Most recently she has been treated on a clinical trial at [facility] with Panitumumab, nivolumab and ipilimumab.  She developed immunotherapy-induced hepatitis and is completing a steroid taper.  Immunotherapy has been discontinued.  The plan is to continue maintenance Panitumumab on a 2-week schedule.  We reviewed today's labs.  Magnesium is mildly elevated.  She will decrease magnesium to 3 tablets a day.  She has hypokalemia.  She will begin K-Dur 20 milliequivalents twice a day for 3 days then 20 mEq daily.  She will return for lab, follow-up in Panitumumab in 2 weeks.  She will contact the office in the interim with any problems.  Patient seen with Dr. [name].    [name]   03/29/2017  12:35 PM  This was a shared visit with Ned Card.  Brenda Becker was interviewed and examined.  I discussed the case with Dr. Aleatha Borer.  She had a clinical response while on the clinical trial, but developed hepatitis secondary  to immunotherapy.  The plan is to continue maintenance panitumumab.  Julieanne Manson, MD

## 2017-04-02 DIAGNOSIS — Z8619 Personal history of other infectious and parasitic diseases: Secondary | ICD-10-CM

## 2017-04-02 HISTORY — DX: Personal history of other infectious and parasitic diseases: Z86.19

## 2017-04-08 ENCOUNTER — Other Ambulatory Visit: Payer: Self-pay | Admitting: Oncology

## 2017-04-12 ENCOUNTER — Inpatient Hospital Stay: Payer: PRIVATE HEALTH INSURANCE

## 2017-04-12 ENCOUNTER — Telehealth: Payer: Self-pay | Admitting: Oncology

## 2017-04-12 ENCOUNTER — Inpatient Hospital Stay: Payer: PRIVATE HEALTH INSURANCE | Attending: Oncology

## 2017-04-12 ENCOUNTER — Inpatient Hospital Stay (HOSPITAL_BASED_OUTPATIENT_CLINIC_OR_DEPARTMENT_OTHER): Payer: PRIVATE HEALTH INSURANCE | Admitting: Oncology

## 2017-04-12 VITALS — BP 149/84 | HR 93 | Temp 98.8°F | Resp 17 | Ht 65.5 in | Wt 222.8 lb

## 2017-04-12 DIAGNOSIS — C184 Malignant neoplasm of transverse colon: Secondary | ICD-10-CM

## 2017-04-12 DIAGNOSIS — Z5112 Encounter for antineoplastic immunotherapy: Secondary | ICD-10-CM | POA: Insufficient documentation

## 2017-04-12 DIAGNOSIS — C189 Malignant neoplasm of colon, unspecified: Secondary | ICD-10-CM

## 2017-04-12 DIAGNOSIS — C779 Secondary and unspecified malignant neoplasm of lymph node, unspecified: Secondary | ICD-10-CM

## 2017-04-12 DIAGNOSIS — Z95828 Presence of other vascular implants and grafts: Secondary | ICD-10-CM

## 2017-04-12 LAB — CBC WITH DIFFERENTIAL (CANCER CENTER ONLY)
BASOS PCT: 1 %
Basophils Absolute: 0 10*3/uL (ref 0.0–0.1)
Eosinophils Absolute: 0.1 10*3/uL (ref 0.0–0.5)
Eosinophils Relative: 2 %
HCT: 34.8 % (ref 34.8–46.6)
HEMOGLOBIN: 11.8 g/dL (ref 11.6–15.9)
Lymphocytes Relative: 32 %
Lymphs Abs: 1.8 10*3/uL (ref 0.9–3.3)
MCH: 29.5 pg (ref 25.1–34.0)
MCHC: 34 g/dL (ref 31.5–36.0)
MCV: 86.8 fL (ref 79.5–101.0)
MONOS PCT: 8 %
Monocytes Absolute: 0.4 10*3/uL (ref 0.1–0.9)
NEUTROS PCT: 57 %
Neutro Abs: 3.3 10*3/uL (ref 1.5–6.5)
Platelet Count: 187 10*3/uL (ref 145–400)
RBC: 4 MIL/uL (ref 3.70–5.45)
RDW: 14.2 % (ref 11.2–14.5)
WBC: 5.6 10*3/uL (ref 3.9–10.3)

## 2017-04-12 LAB — CMP (CANCER CENTER ONLY)
ALBUMIN: 3.2 g/dL — AB (ref 3.5–5.0)
ALK PHOS: 77 U/L (ref 40–150)
ALT: 25 U/L (ref 0–55)
AST: 20 U/L (ref 5–34)
Anion gap: 8 (ref 3–11)
BILIRUBIN TOTAL: 0.3 mg/dL (ref 0.2–1.2)
BUN: 8 mg/dL (ref 7–26)
CALCIUM: 8.9 mg/dL (ref 8.4–10.4)
CO2: 23 mmol/L (ref 22–29)
CREATININE: 0.95 mg/dL (ref 0.60–1.10)
Chloride: 111 mmol/L — ABNORMAL HIGH (ref 98–109)
GFR, Est AFR Am: >60 mL/min (ref >60)
GFR, Estimated: >60 mL/min (ref >60)
GLUCOSE: 111 mg/dL (ref 70–140)
Potassium: 3.6 mmol/L (ref 3.5–5.1)
SODIUM: 142 mmol/L (ref 136–145)
TOTAL PROTEIN: 6.2 g/dL — AB (ref 6.4–8.3)

## 2017-04-12 LAB — MAGNESIUM: Magnesium: 1.7 mg/dL (ref 1.5–2.5)

## 2017-04-12 MED ORDER — SODIUM CHLORIDE 0.9 % IJ SOLN
10.0000 mL | INTRAMUSCULAR | Status: DC | PRN
  Administered 2017-04-12: 10 mL via INTRAVENOUS
  Filled 2017-04-12: qty 10

## 2017-04-12 MED ORDER — SODIUM CHLORIDE 0.9% FLUSH
10.0000 mL | INTRAVENOUS | Status: DC | PRN
  Administered 2017-04-12: 10 mL
  Filled 2017-04-12: qty 10

## 2017-04-12 MED ORDER — SODIUM CHLORIDE 0.9 % IV SOLN
6.0000 mg/kg | Freq: Once | INTRAVENOUS | Status: AC
  Administered 2017-04-12: 600 mg via INTRAVENOUS
  Filled 2017-04-12: qty 10

## 2017-04-12 MED ORDER — HEPARIN SOD (PORK) LOCK FLUSH 100 UNIT/ML IV SOLN
500.0000 [IU] | Freq: Once | INTRAVENOUS | Status: AC | PRN
  Administered 2017-04-12: 500 [IU]
  Filled 2017-04-12: qty 5

## 2017-04-12 MED ORDER — SODIUM CHLORIDE 0.9 % IV SOLN
Freq: Once | INTRAVENOUS | Status: AC
Start: 2017-04-12 — End: 2017-04-12
  Administered 2017-04-12: 10:00:00 via INTRAVENOUS

## 2017-04-12 NOTE — Patient Instructions (Signed)
[facility] Discharge Instructions for Patients Receiving Chemotherapy  Today you received the following chemotherapy agents: Vectibix  To help prevent nausea and vomiting after your treatment, we encourage you to take your nausea medication as prescribed.    If you develop nausea and vomiting that is not controlled by your nausea medication, call the clinic.   BELOW ARE SYMPTOMS THAT SHOULD BE REPORTED IMMEDIATELY:  *FEVER GREATER THAN 100.5 F  *CHILLS WITH OR WITHOUT FEVER  NAUSEA AND VOMITING THAT IS NOT CONTROLLED WITH YOUR NAUSEA MEDICATION  *UNUSUAL SHORTNESS OF BREATH  *UNUSUAL BRUISING OR BLEEDING  TENDERNESS IN MOUTH AND THROAT WITH OR WITHOUT PRESENCE OF ULCERS  *URINARY PROBLEMS  *BOWEL PROBLEMS  UNUSUAL RASH Items with * indicate a potential emergency and should be followed up as soon as possible.  Feel free to call the clinic should you have any questions or concerns. The clinic phone number is [phone].  Please show the CHEMO ALERT CARD at check-in to the Emergency Department and triage nurse.   

## 2017-04-12 NOTE — Telephone Encounter (Signed)
Scheduled appt per 4/11 los - Patient to get an updated schedule next visit.

## 2017-04-12 NOTE — Progress Notes (Signed)
[facility] OFFICE PROGRESS NOTE   Diagnosis: Colon cancer  INTERVAL HISTORY:   Brenda Becker completed a cycle of panitumumab on 03/29/2017.  She has noted an increase in the rash.  She has an area of inflammation at a left toe.  No diarrhea.  She feels well.  She is working.  Objective:  Vital signs in last 24 hours:  Blood pressure (!) 149/84, pulse 93, temperature 98.8 F (37.1 C), temperature source Oral, resp. rate 17, height 5' 5.5" (1.664 m), weight 222 lb 12.8 oz (101.1 kg), SpO2 100 %.    HEENT: No thrush or ulcers Resp: Lungs clear bilaterally Cardio: Regular rate and rhythm GI: No hepatomegaly, nontender Vascular: No leg edema  Skin: Acne type rash over the face and trunk, area of paronychia at 1 of the left toes.  Portacath/PICC-without erythema  Lab Results:  Lab Results  Component Value Date   WBC 5.6 04/12/2017   HGB 13.4 06/01/2016   HCT 34.8 04/12/2017   MCV 86.8 04/12/2017   PLT 187 04/12/2017   NEUTROABS 3.3 04/12/2017    CMP     Component Value Date/Time   NA 144 03/29/2017 1132   NA 142 06/01/2016 0925   K 2.8 (LL) 03/29/2017 1132   K 3.9 06/01/2016 0925   CL 102 03/29/2017 1132   CO2 28 03/29/2017 1132   CO2 23 06/01/2016 0925   GLUCOSE 176 (H) 03/29/2017 1132   GLUCOSE 117 06/01/2016 0925   BUN 12 03/29/2017 1132   BUN 6.9 (L) 06/01/2016 0925   CREATININE 1.14 (H) 03/29/2017 1132   CREATININE 0.9 06/01/2016 0925   CALCIUM 8.7 03/29/2017 1132   CALCIUM 9.1 06/01/2016 0925   PROT 6.3 (L) 03/29/2017 1132   PROT 6.9 06/01/2016 0925   ALBUMIN 3.6 03/29/2017 1132   ALBUMIN 3.7 06/01/2016 0925   AST 26 03/29/2017 1132   AST 22 06/01/2016 0925   ALT 57 (H) 03/29/2017 1132   ALT 32 06/01/2016 0925   ALKPHOS 80 03/29/2017 1132   ALKPHOS 102 06/01/2016 0925   BILITOT 0.5 03/29/2017 1132   BILITOT 0.37 06/01/2016 0925   GFRNONAA 57 (L) 03/29/2017 1132   GFRAA >60 03/29/2017 1132   Magnesium 1.7  Medications: I have  reviewed the patient's current medications.   Assessment/Plan: 1. 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  Cycle 2 CAPOX 01/22/2015  Cycle 3 CAPOX 02/12/2015  Cycle 4 CAPOX 03/05/2015  Cycle 5 CAPOX 03/26/2015  Cycle 6 CAPOX 04/16/2015  Cycle 7 CAPOX 05/07/2015 (oxaliplatin held due to progressive neuropathy)  Cycle 8 CAPOX 05/28/2015 (oxaliplatin held secondary to neuropathy)  CTs 11/01/2015-no significant change and an 11 mm upper abdomen mesenteric lymph node with partial calcification-treated metastasis?, No other evidence of metastatic disease  CTs 01/21/2016-mild interval progression of previously identified calcified nodal tissue in the upper central abdomen. Development of small adjacent lymph nodes in the interval.  EUS guided biopsy of the calcified upper abdominal mass on 02/17/2016 confirmed metastatic adenocarcinoma  Cycle 1 FOLFIRI/Avastin 03/09/2016  Cycle 2 FOLFIRI/Avastin 03/23/2016  Cycle 3 FOLFIRI/Avastin 04/06/2016 (Neulasta added)  Cycle 4 FOLFIRI/Avastin 04/20/2016  Cycle 5 FOLFIRI/Avastin 05/04/2016  Cycle 6 FOLFIRI/Avastin 05/18/2016  CT abdomen/pelvis 05/30/2016-new retroperitoneal lymphadenopathy in the para-aortic region with the largest node measuring 1.5  cm. Partially calcified mesenteric lymph node measures 12 mm as compared to 14 mm previously. Other scattered less than 1 cmmesenteric lymph nodes remain stable.  PET scan 06/13/2016-hypermetabolic posterior mediastinal node, hypermetabolic retroperitoneal lymph nodes, stable transverse mesocolon mass, bilateral hypermetabolic inguinal nodes  EUS biopsy of posterior mediastinal lymph nodes on  07/20/2016-positive for metastatic adenocarcinoma  Clinical trial at [facility], [id number] Panitumumab, nivolumab, ipilimumab.  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  No change in small calcified mesenteric and retroperitoneal abdominal lymph nodes.  No evidence of metastatic disease in the chest.  Maintenance panitumumab beginning 03/29/2017 2. History of Microcytic anemia-likely iron deficiency anemia secondary to #1 and ongoing menses 3. Status post cholecystectomy 4. Family history of breast and ovarian cancer. She has seen the genetics counselor. 5. Delayed nausea following cycle 1 CAPOX. Aloxi and Emend added beginning with cycle 2. 6. Transient episodes of dyspnea following cycle 4 and cycle 5 CAPOX, likely neurotoxicity from oxaliplatin 7. Oxaliplatin neuropathy-improved 8. Mild neutropenia secondary to chemotherapy-Neulasta added beginning with cycle 3 FOLFIRI/Avastin 9. Mild mucositis following cycle 4 FOLFIRI/Avastin 10. Mammogram 05/11/2016-possible mass right breast. Right breast ultrasound 05/17/2016-right breast mass at 10:00 most likely represents a cyst, however is somewhat hypoechoic and could also represent a fibroadenoma. Ultrasound-guided aspiration of a 1 cmbenign cyst 05/22/2016. 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   Disposition: Brenda Becker appears well.  She will continue every 2-week panitumumab.  She continues doxycycline.  She is maintained on potassium and magnesium replacement.  She will return for an office visit and panitumumab in 2 weeks.  15 minutes were spent with the patient today.  The majority  of the time was used for counseling and coordination of care.  Betsy Coder, MD  04/12/2017  9:50 AM

## 2017-04-16 ENCOUNTER — Other Ambulatory Visit: Payer: Self-pay | Admitting: Obstetrics & Gynecology

## 2017-04-16 DIAGNOSIS — Z139 Encounter for screening, unspecified: Secondary | ICD-10-CM

## 2017-04-22 ENCOUNTER — Other Ambulatory Visit: Payer: Self-pay | Admitting: Oncology

## 2017-04-26 ENCOUNTER — Inpatient Hospital Stay (HOSPITAL_BASED_OUTPATIENT_CLINIC_OR_DEPARTMENT_OTHER): Payer: PRIVATE HEALTH INSURANCE | Admitting: Oncology

## 2017-04-26 ENCOUNTER — Inpatient Hospital Stay: Payer: PRIVATE HEALTH INSURANCE

## 2017-04-26 ENCOUNTER — Telehealth: Payer: Self-pay | Admitting: Oncology

## 2017-04-26 VITALS — BP 135/89 | HR 83 | Temp 98.6°F | Resp 18 | Ht 65.5 in | Wt 223.8 lb

## 2017-04-26 DIAGNOSIS — Z5112 Encounter for antineoplastic immunotherapy: Secondary | ICD-10-CM | POA: Diagnosis not present

## 2017-04-26 DIAGNOSIS — C779 Secondary and unspecified malignant neoplasm of lymph node, unspecified: Secondary | ICD-10-CM

## 2017-04-26 DIAGNOSIS — C189 Malignant neoplasm of colon, unspecified: Secondary | ICD-10-CM | POA: Diagnosis not present

## 2017-04-26 LAB — CMP (CANCER CENTER ONLY)
ALT: 19 U/L (ref 0–55)
ANION GAP: 9 (ref 3–11)
AST: 20 U/L (ref 5–34)
Albumin: 3.4 g/dL — ABNORMAL LOW (ref 3.5–5.0)
Alkaline Phosphatase: 83 U/L (ref 40–150)
BUN: 7 mg/dL (ref 7–26)
CHLORIDE: 109 mmol/L (ref 98–109)
CO2: 23 mmol/L (ref 22–29)
Calcium: 8.9 mg/dL (ref 8.4–10.4)
Creatinine: 1.03 mg/dL (ref 0.60–1.10)
GFR, Est AFR Am: >60 mL/min (ref >60)
Glucose, Bld: 117 mg/dL (ref 70–140)
POTASSIUM: 3.4 mmol/L — AB (ref 3.5–5.1)
SODIUM: 141 mmol/L (ref 136–145)
Total Bilirubin: 0.3 mg/dL (ref 0.2–1.2)
Total Protein: 6.3 g/dL — ABNORMAL LOW (ref 6.4–8.3)

## 2017-04-26 LAB — MAGNESIUM: MAGNESIUM: 1.3 mg/dL — AB (ref 1.7–2.4)

## 2017-04-26 MED ORDER — POTASSIUM CHLORIDE CRYS ER 20 MEQ PO TBCR: EXTENDED_RELEASE_TABLET | ORAL | 0 refills | Status: DC

## 2017-04-26 MED ORDER — SODIUM CHLORIDE 0.9% FLUSH
10.0000 mL | INTRAVENOUS | Status: DC | PRN
  Administered 2017-04-26: 10 mL
  Filled 2017-04-26: qty 10

## 2017-04-26 MED ORDER — HEPARIN SOD (PORK) LOCK FLUSH 100 UNIT/ML IV SOLN
500.0000 [IU] | Freq: Once | INTRAVENOUS | Status: AC | PRN
  Administered 2017-04-26: 500 [IU]
  Filled 2017-04-26: qty 5

## 2017-04-26 MED ORDER — SODIUM CHLORIDE 0.9 % IV SOLN
6.0000 mg/kg | Freq: Once | INTRAVENOUS | Status: AC
  Administered 2017-04-26: 600 mg via INTRAVENOUS
  Filled 2017-04-26: qty 10

## 2017-04-26 MED ORDER — SODIUM CHLORIDE 0.9 % IV SOLN
Freq: Once | INTRAVENOUS | Status: AC
  Administered 2017-04-26: 09:00:00 via INTRAVENOUS

## 2017-04-26 NOTE — Addendum Note (Signed)
Addended by: Mathis Fare on: 04/26/2017 01:55 PM   Modules accepted: Orders

## 2017-04-26 NOTE — Patient Instructions (Signed)
[facility] Discharge Instructions for Patients Receiving Chemotherapy  Today you received the following chemotherapy agents: Panitumumab (Vectibix).  To help prevent nausea and vomiting after your treatment, we encourage you to take your nausea medication as prescribed.  If you develop nausea and vomiting that is not controlled by your nausea medication, call the clinic.   BELOW ARE SYMPTOMS THAT SHOULD BE REPORTED IMMEDIATELY:  *FEVER GREATER THAN 100.5 F  *CHILLS WITH OR WITHOUT FEVER  NAUSEA AND VOMITING THAT IS NOT CONTROLLED WITH YOUR NAUSEA MEDICATION  *UNUSUAL SHORTNESS OF BREATH  *UNUSUAL BRUISING OR BLEEDING  TENDERNESS IN MOUTH AND THROAT WITH OR WITHOUT PRESENCE OF ULCERS  *URINARY PROBLEMS  *BOWEL PROBLEMS  UNUSUAL RASH Items with * indicate a potential emergency and should be followed up as soon as possible.  Feel free to call the clinic should you have any questions or concerns. The clinic phone number is [phone].  Please show the [facility] at check-in to the Emergency Department and triage nurse.

## 2017-04-26 NOTE — Progress Notes (Signed)
[facility] OFFICE PROGRESS NOTE   Diagnosis: Colon cancer  INTERVAL HISTORY:   Ms. Michelini returns as scheduled.  She completed another treatment with Panitumumab on 05/18/2016.  No diarrhea.  The skin rash and ulcerations at the hands and feet have improved since she resumed doxycycline.  She reports a dry mouth.  She feels well.  Objective:  Vital signs in last 24 hours:  Blood pressure 135/89, pulse 83, temperature 98.6 F (37 C), temperature source Oral, resp. rate 18, height 5' 5.5" (1.664 m), weight 223 lb 12.8 oz (101.5 kg), SpO2 100 %.    HEENT: No thrush or ulcers Resp: Clear bilaterally Cardio: Regular rate and rhythm GI: No hepatomegaly, nontender Vascular: No leg edema  Skin: Mild acne type rash over the face and trunk.  Dryness of the hands and feet without skin breakdown  Portacath/PICC-without erythema  Lab Results:  Lab Results  Component Value Date   WBC 5.6 04/12/2017   HGB 11.8 04/12/2017   HCT 34.8 04/12/2017   MCV 86.8 04/12/2017   PLT 187 04/12/2017   NEUTROABS 3.3 04/12/2017    CMP     Component Value Date/Time   NA 141 04/26/2017 0753   NA 142 06/01/2016 0925   K 3.4 (L) 04/26/2017 0753   K 3.9 06/01/2016 0925   CL 109 04/26/2017 0753   CO2 23 04/26/2017 0753   CO2 23 06/01/2016 0925   GLUCOSE 117 04/26/2017 0753   GLUCOSE 117 06/01/2016 0925   BUN 7 04/26/2017 0753   BUN 6.9 (L) 06/01/2016 0925   CREATININE 1.03 04/26/2017 0753   CREATININE 0.9 06/01/2016 0925   CALCIUM 8.9 04/26/2017 0753   CALCIUM 9.1 06/01/2016 0925   PROT 6.3 (L) 04/26/2017 0753   PROT 6.9 06/01/2016 0925   ALBUMIN 3.4 (L) 04/26/2017 0753   ALBUMIN 3.7 06/01/2016 0925   AST 20 04/26/2017 0753   AST 22 06/01/2016 0925   ALT 19 04/26/2017 0753   ALT 32 06/01/2016 0925   ALKPHOS 83 04/26/2017 0753   ALKPHOS 102 06/01/2016 0925   BILITOT 0.3 04/26/2017 0753   BILITOT 0.37 06/01/2016 0925   GFRNONAA >60 04/26/2017 0753   GFRAA >60 04/26/2017  0753   Magnesium-1.3 Lab Results  Component Value Date   CEA1 1.86 06/01/2016     Medications: I have reviewed the patient's current medications.   Assessment/Plan: 1. 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  Cycle 2 CAPOX 01/22/2015  Cycle 3 CAPOX 02/12/2015  Cycle 4 CAPOX 03/05/2015  Cycle 5 CAPOX 03/26/2015  Cycle 6 CAPOX 04/16/2015  Cycle 7 CAPOX 05/07/2015 (oxaliplatin held due to progressive neuropathy)  Cycle 8 CAPOX 05/28/2015 (oxaliplatin held secondary to neuropathy)  CTs 11/01/2015-no significant change and an 11 mm upper abdomen mesenteric lymph node with partial calcification-treated metastasis?, No other evidence of metastatic disease  CTs 01/21/2016-mild interval progression of previously identified calcified nodal tissue in the upper central abdomen. Development of small adjacent lymph nodes in the interval.  EUS guided biopsy of the calcified upper abdominal mass on 02/17/2016 confirmed metastatic adenocarcinoma  Cycle 1 FOLFIRI/Avastin 03/09/2016  Cycle 2 FOLFIRI/Avastin 03/23/2016  Cycle 3 FOLFIRI/Avastin 04/06/2016 (Neulasta added)  Cycle 4 FOLFIRI/Avastin 04/20/2016  Cycle 5 FOLFIRI/Avastin 05/04/2016  Cycle 6 FOLFIRI/Avastin 05/18/2016  CT  abdomen/pelvis 05/30/2016-new retroperitoneal lymphadenopathy in the para-aortic region with the largest node measuring 1.5 cm. Partially calcified mesenteric lymph node measures 12 mm as compared to 14 mm previously. Other scattered less than 1 cmmesenteric lymph nodes remain stable.  PET scan 06/13/2016-hypermetabolic posterior mediastinal node, hypermetabolic retroperitoneal lymph nodes, stable transverse  mesocolon mass, bilateral hypermetabolic inguinal nodes  EUS biopsy of posterior mediastinal lymph nodes on 07/20/2016-positive for metastatic adenocarcinoma  Clinical trial at [facility], [id number] Panitumumab, nivolumab, ipilimumab.  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 No change in small calcified mesenteric and retroperitoneal abdominal lymph nodes. No evidence of metastatic disease in the chest.  Maintenance panitumumab beginning 03/29/2017 2. History of Microcytic anemia-likely iron deficiency anemia secondary to #1 and ongoing menses 3. Status post cholecystectomy 4. Family history of breast and ovarian cancer. She has seen the genetics counselor. 5. Delayed nausea following cycle 1 CAPOX. Aloxi and Emend added beginning with cycle 2. 6. Transient episodes of dyspnea following cycle 4 and cycle 5 CAPOX, likely neurotoxicity from oxaliplatin 7. Oxaliplatin neuropathy-improved 8. Mild neutropenia secondary to chemotherapy-Neulasta added beginning with cycle 3 FOLFIRI/Avastin 9. Mild mucositis following cycle 4 FOLFIRI/Avastin 10. Mammogram 05/11/2016-possible mass right breast. Right breast ultrasound 05/17/2016-right breast mass at 10:00 most likely represents a cyst, however is somewhat hypoechoic and could also represent a fibroadenoma. Ultrasound-guided aspiration of a 1 cmbenign cyst 05/22/2016. 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   Disposition: Brenda Becker appears well.  The plan is to continue every 2-week panitumumab.  She will increase the potassium supplement.  She will return for an office visit and chemotherapy  in 2 weeks.  25 minutes were spent with the patient today.  The majority of the time was used for counseling and coordination of care.  Betsy Coder, MD  04/26/2017  9:46 AM

## 2017-04-26 NOTE — Addendum Note (Signed)
Addended by: Mathis Fare on: 04/26/2017 10:04 AM   Modules accepted: Orders

## 2017-04-26 NOTE — Telephone Encounter (Signed)
Scheduled appt per 4/25 los- Gave patient aVS and calender per los.

## 2017-04-26 NOTE — Progress Notes (Signed)
MD made aware of magnesium of 1.3. Per Dr. [name], ok to treat with mag level.

## 2017-05-06 ENCOUNTER — Other Ambulatory Visit: Payer: Self-pay | Admitting: Oncology

## 2017-05-10 ENCOUNTER — Telehealth: Payer: Self-pay | Admitting: Oncology

## 2017-05-10 ENCOUNTER — Inpatient Hospital Stay: Payer: PRIVATE HEALTH INSURANCE | Attending: Oncology

## 2017-05-10 ENCOUNTER — Inpatient Hospital Stay (HOSPITAL_BASED_OUTPATIENT_CLINIC_OR_DEPARTMENT_OTHER): Payer: PRIVATE HEALTH INSURANCE | Admitting: Nurse Practitioner

## 2017-05-10 ENCOUNTER — Encounter: Payer: Self-pay | Admitting: Nurse Practitioner

## 2017-05-10 ENCOUNTER — Inpatient Hospital Stay: Payer: PRIVATE HEALTH INSURANCE

## 2017-05-10 VITALS — BP 133/84 | HR 86 | Temp 98.7°F | Resp 17 | Ht 65.5 in | Wt 227.1 lb

## 2017-05-10 DIAGNOSIS — Z5112 Encounter for antineoplastic immunotherapy: Secondary | ICD-10-CM | POA: Diagnosis not present

## 2017-05-10 DIAGNOSIS — C184 Malignant neoplasm of transverse colon: Secondary | ICD-10-CM | POA: Insufficient documentation

## 2017-05-10 DIAGNOSIS — C189 Malignant neoplasm of colon, unspecified: Secondary | ICD-10-CM

## 2017-05-10 DIAGNOSIS — R21 Rash and other nonspecific skin eruption: Secondary | ICD-10-CM | POA: Diagnosis not present

## 2017-05-10 DIAGNOSIS — Z95828 Presence of other vascular implants and grafts: Secondary | ICD-10-CM

## 2017-05-10 LAB — CMP (CANCER CENTER ONLY)
ALT: 28 U/L (ref 0–55)
AST: 33 U/L (ref 5–34)
Albumin: 3.4 g/dL — ABNORMAL LOW (ref 3.5–5.0)
Alkaline Phosphatase: 108 U/L (ref 40–150)
Anion gap: 6 (ref 3–11)
BILIRUBIN TOTAL: 0.3 mg/dL (ref 0.2–1.2)
BUN: 9 mg/dL (ref 7–26)
CO2: 23 mmol/L (ref 22–29)
CREATININE: 1.08 mg/dL (ref 0.60–1.10)
Calcium: 8.9 mg/dL (ref 8.4–10.4)
Chloride: 110 mmol/L — ABNORMAL HIGH (ref 98–109)
GFR, Est AFR Am: >60 mL/min (ref >60)
Glucose, Bld: 107 mg/dL (ref 70–140)
Potassium: 3.8 mmol/L (ref 3.5–5.1)
Sodium: 139 mmol/L (ref 136–145)
TOTAL PROTEIN: 6.4 g/dL (ref 6.4–8.3)

## 2017-05-10 LAB — MAGNESIUM: Magnesium: 1.3 mg/dL — CL (ref 1.7–2.4)

## 2017-05-10 MED ORDER — SODIUM CHLORIDE 0.9 % IJ SOLN
10.0000 mL | INTRAMUSCULAR | Status: DC | PRN
  Administered 2017-05-10: 10 mL via INTRAVENOUS
  Filled 2017-05-10: qty 10

## 2017-05-10 MED ORDER — SODIUM CHLORIDE 0.9 % IV SOLN
Freq: Once | INTRAVENOUS | Status: AC
  Administered 2017-05-10: 10:00:00 via INTRAVENOUS

## 2017-05-10 MED ORDER — SODIUM CHLORIDE 0.9% FLUSH
10.0000 mL | INTRAVENOUS | Status: DC | PRN
  Administered 2017-05-10: 10 mL
  Filled 2017-05-10: qty 10

## 2017-05-10 MED ORDER — PANITUMUMAB CHEMO INJECTION 100 MG/5ML
6.0000 mg/kg | Freq: Once | INTRAVENOUS | Status: AC
  Administered 2017-05-10: 600 mg via INTRAVENOUS
  Filled 2017-05-10: qty 20

## 2017-05-10 MED ORDER — HEPARIN SOD (PORK) LOCK FLUSH 100 UNIT/ML IV SOLN
500.0000 [IU] | Freq: Once | INTRAVENOUS | Status: AC | PRN
  Administered 2017-05-10: 500 [IU]
  Filled 2017-05-10: qty 5

## 2017-05-10 NOTE — Patient Instructions (Signed)

## 2017-05-10 NOTE — Patient Instructions (Signed)
[facility] Discharge Instructions for Patients Receiving Chemotherapy  Today you received the following chemotherapy agents: Panitumumab (Vectibix).  To help prevent nausea and vomiting after your treatment, we encourage you to take your nausea medication as prescribed.  If you develop nausea and vomiting that is not controlled by your nausea medication, call the clinic.   BELOW ARE SYMPTOMS THAT SHOULD BE REPORTED IMMEDIATELY:  *FEVER GREATER THAN 100.5 F  *CHILLS WITH OR WITHOUT FEVER  NAUSEA AND VOMITING THAT IS NOT CONTROLLED WITH YOUR NAUSEA MEDICATION  *UNUSUAL SHORTNESS OF BREATH  *UNUSUAL BRUISING OR BLEEDING  TENDERNESS IN MOUTH AND THROAT WITH OR WITHOUT PRESENCE OF ULCERS  *URINARY PROBLEMS  *BOWEL PROBLEMS  UNUSUAL RASH Items with * indicate a potential emergency and should be followed up as soon as possible.  Feel free to call the clinic should you have any questions or concerns. The clinic phone number is [phone].  Please show the [facility] at check-in to the Emergency Department and triage nurse.

## 2017-05-10 NOTE — Telephone Encounter (Signed)
Scheduled appt per 5/9 los- patient is aware of appt date and time - no print out wanted.

## 2017-05-10 NOTE — Progress Notes (Signed)
Brenda Becker in infusion that per Ned Card NP ok for pt to have infusion treatment. Also advised to make pt aware that per Ned Card NP Magnesium levels were a little low and to continue the same dose of Magnesium.

## 2017-05-10 NOTE — Progress Notes (Signed)
[facility] OFFICE PROGRESS NOTE   Diagnosis: Colon cancer  INTERVAL HISTORY:   [name] returns as scheduled.  She completed another cycle of Panitumumab 04/26/2017.  She denies nausea/vomiting.  No mouth sores.  No significant diarrhea.  Skin rash continues to be improved.  The "cracks" on her hands are better as well.  She continues doxycycline.  She denies pain.  She has a good appetite.  Objective:  Vital signs in last 24 hours:  Blood pressure 133/84, pulse 86, temperature 98.7 F (37.1 C), temperature source Oral, resp. rate 17, height 5' 5.5" (1.664 m), weight 227 lb 1.6 oz (103 kg), SpO2 100 %.    HEENT: No thrush or ulcers. Resp: Lungs clear bilaterally. Cardio: Regular rate and rhythm. GI: Abdomen soft and nontender.  No hepatomegaly. Vascular: No leg edema.  Skin: Mild acne type rash over the face and trunk.  Palms without erythema.  No skin breakdown. Port-A-Cath without erythema.   Lab Results:  Lab Results  Component Value Date   WBC 5.6 04/12/2017   HGB 11.8 04/12/2017   HCT 34.8 04/12/2017   MCV 86.8 04/12/2017   PLT 187 04/12/2017   NEUTROABS 3.3 04/12/2017    Imaging:  No results found.  Medications: I have reviewed the patient's current medications.  Assessment/Plan: 1. 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  Cycle 2 CAPOX 01/22/2015  Cycle 3 CAPOX 02/12/2015  Cycle 4 CAPOX 03/05/2015  Cycle 5 CAPOX 03/26/2015  Cycle 6 CAPOX 04/16/2015  Cycle 7 CAPOX 05/07/2015 (oxaliplatin held due to progressive neuropathy)  Cycle 8 CAPOX 05/28/2015 (oxaliplatin held secondary to neuropathy)  CTs 11/01/2015-no significant change and  an 11 mm upper abdomen mesenteric lymph node with partial calcification-treated metastasis?, No other evidence of metastatic disease  CTs 01/21/2016-mild interval progression of previously identified calcified nodal tissue in the upper central abdomen. Development of small adjacent lymph nodes in the interval.  EUS guided biopsy of the calcified upper abdominal mass on 02/17/2016 confirmed metastatic adenocarcinoma  Cycle 1 FOLFIRI/Avastin 03/09/2016  Cycle 2 FOLFIRI/Avastin 03/23/2016  Cycle 3 FOLFIRI/Avastin 04/06/2016 (Neulasta added)  Cycle 4 FOLFIRI/Avastin 04/20/2016  Cycle 5 FOLFIRI/Avastin 05/04/2016  Cycle 6 FOLFIRI/Avastin 05/18/2016  CT abdomen/pelvis 05/30/2016-new retroperitoneal lymphadenopathy in the para-aortic region with the largest node measuring 1.5 cm. Partially calcified mesenteric lymph node measures 12 mm as compared to 14 mm previously. Other scattered less than 1 cmmesenteric lymph nodes remain stable.  PET scan 06/13/2016-hypermetabolic posterior mediastinal node, hypermetabolic retroperitoneal lymph nodes, stable transverse mesocolon mass, bilateral hypermetabolic inguinal nodes  EUS biopsy of posterior mediastinal lymph nodes on 07/20/2016-positive for metastatic adenocarcinoma  Clinical trial at [facility], [id number] Panitumumab, nivolumab, ipilimumab.  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 No change in small calcified mesenteric and retroperitoneal abdominal lymph nodes. No evidence of metastatic disease in the chest.  Maintenance panitumumab beginning 03/29/2017 2. History of Microcytic anemia-likely iron deficiency anemia  secondary to #1 and ongoing menses 3. Status post cholecystectomy 4. Family history of breast and ovarian cancer. She  has seen the genetics counselor. 5. Delayed nausea following cycle 1 CAPOX. Aloxi and Emend added beginning with cycle 2. 6. Transient episodes of dyspnea following cycle 4 and cycle 5 CAPOX, likely neurotoxicity from oxaliplatin 7. Oxaliplatin neuropathy-improved 8. Mild neutropenia secondary to chemotherapy-Neulasta added beginning with cycle 3 FOLFIRI/Avastin 9. Mild mucositis following cycle 4 FOLFIRI/Avastin 10. Mammogram 05/11/2016-possible mass right breast. Right breast ultrasound 05/17/2016-right breast mass at 10:00 most likely represents a cyst, however is somewhat hypoechoic and could also represent a fibroadenoma. Ultrasound-guided aspiration of a 1 cmbenign cyst 05/22/2016. 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 12. Hypomagnesia secondary to Panitumumab.  She continues magnesium oxide.      Disposition: [name] appears stable.  Plan to proceed with Panitumumab today as scheduled and continue every 2 weeks.  Magnesium level remains low.  She recently increased magnesium oxide to 4 tablets daily.  She will continue the same and we will repeat labs in 2 weeks.  She will return for lab, follow-up and Panitumumab in 2 weeks.  She will contact the office in the interim with any problems.  Plan reviewed with Dr. [name].   [name]   05/10/2017  9:07 AM

## 2017-05-14 ENCOUNTER — Ambulatory Visit
Admission: RE | Admit: 2017-05-14 | Discharge: 2017-05-14 | Disposition: A | Discharge status: Home / Self Care | Payer: PRIVATE HEALTH INSURANCE | Source: Ambulatory Visit | Attending: Obstetrics & Gynecology | Admitting: Obstetrics & Gynecology

## 2017-05-14 DIAGNOSIS — Z139 Encounter for screening, unspecified: Secondary | ICD-10-CM

## 2017-05-17 ENCOUNTER — Encounter: Payer: Self-pay | Admitting: Obstetrics & Gynecology

## 2017-05-17 ENCOUNTER — Ambulatory Visit (INDEPENDENT_AMBULATORY_CARE_PROVIDER_SITE_OTHER): Payer: PRIVATE HEALTH INSURANCE | Admitting: Obstetrics & Gynecology

## 2017-05-17 VITALS — BP 132/90 | Ht 64.5 in | Wt 224.0 lb

## 2017-05-17 DIAGNOSIS — C189 Malignant neoplasm of colon, unspecified: Secondary | ICD-10-CM | POA: Diagnosis not present

## 2017-05-17 DIAGNOSIS — Z3041 Encounter for surveillance of contraceptive pills: Secondary | ICD-10-CM | POA: Diagnosis not present

## 2017-05-17 DIAGNOSIS — Z01419 Encounter for gynecological examination (general) (routine) without abnormal findings: Secondary | ICD-10-CM

## 2017-05-17 MED ORDER — NORETHIN ACE-ETH ESTRAD-FE 1-20 MG-MCG PO TABS: 1.0000 | ORAL_TABLET | Freq: Every day | ORAL | 4 refills | Status: DC

## 2017-05-17 NOTE — Progress Notes (Signed)
Brenda Becker [birth year] [id number]   History:    47 y.o. G1P1L1 Married.  Daughter 74 yo.  RP:  Established patient presenting for annual gyn exam   HPI: Well on Junel Fe 1/20.  No breakthrough bleeding.  No pelvic pain.  No recent intercourse as patient is still going through chemotherapy for colon Cancer, followed by Dr [name].  Breasts normal.  Urine and bowel movements currently normal.  Body mass index 37.86.  Not regularly physically active at this time.  Past medical history,surgical history, family history and social history were all reviewed and documented in the EPIC chart.  Gynecologic History Patient's last menstrual period was 05/17/2017. Contraception: OCP (estrogen/progesterone) Last Pap: 05/2016. Results were: Negative/HPV HR neg Last mammogram: 05/14/2017. Results were: Negative Bone Density: Never  Obstetric History OB History  Gravida Para Term Preterm AB Living  1 1       1   SAB TAB Ectopic Multiple Live Births               # Outcome Date GA Lbr Len/2nd Weight Sex Delivery Anes PTL Lv  1 Para              ROS: A ROS was performed and pertinent positives and negatives are included in the history.  GENERAL: No fevers or chills. HEENT: No change in vision, no earache, sore throat or sinus congestion. NECK: No pain or stiffness. CARDIOVASCULAR: No chest pain or pressure. No palpitations. PULMONARY: No shortness of breath, cough or wheeze. GASTROINTESTINAL: No abdominal pain, nausea, vomiting or diarrhea, melena or bright red blood per rectum. GENITOURINARY: No urinary frequency, urgency, hesitancy or dysuria. MUSCULOSKELETAL: No joint or muscle pain, no back pain, no recent trauma. DERMATOLOGIC: No rash, no itching, no lesions. ENDOCRINE: No polyuria, polydipsia, no heat or cold intolerance. No recent change in weight. HEMATOLOGICAL: No anemia or easy bruising or bleeding. NEUROLOGIC: No headache, seizures, numbness, tingling or weakness. PSYCHIATRIC: No  depression, no loss of interest in normal activity or change in sleep pattern.     Exam:   BP 132/90   Ht 5' 4.5" (1.638 m)   Wt 224 lb (101.6 kg)   LMP 05/17/2017 Comment: pill   BMI 37.86 kg/m   Body mass index is 37.86 kg/m.  General appearance : Well developed well nourished female. No acute distress HEENT: Eyes: no retinal hemorrhage or exudates,  Neck supple, trachea midline, no carotid bruits, no thyroidmegaly Lungs: Clear to auscultation, no rhonchi or wheezes, or rib retractions  Heart: Regular rate and rhythm, no murmurs or gallops Breast:Examined in sitting and supine position were symmetrical in appearance, no palpable masses or tenderness,  no skin retraction, no nipple inversion, no nipple discharge, no skin discoloration, no axillary or supraclavicular lymphadenopathy Abdomen: no palpable masses or tenderness, no rebound or guarding Extremities: no edema or skin discoloration or tenderness  Pelvic: Vulva: Normal             Vagina: No gross lesions or discharge  Cervix: No gross lesions or discharge  Uterus  AV, normal size, shape and consistency, non-tender and mobile  Adnexa  Without masses or tenderness  Anus: Normal   Assessment/Plan:  47 y.o. female for annual exam   1. Well female exam with routine gynecological exam Normal gynecologic exam.  Pap test negative with negative high risk HPV May 2018.  Will repeat Pap test next year.  Breast exam normal.  Screening mammogram was negative on May 14, 2017.  Recommend walking  as her energy level allows while on chemotherapy.  2. Encounter for surveillance of contraceptive pills Well on Junel FE 1/20.  No contraindication to birth control pills.  Junel FE 1/20 prescriptions was sent to pharmacy.  3. Malignant neoplasm of colon, unspecified part of colon ([place]) On chemotherapy followed by Dr. [name].  Other orders - norethindrone-ethinyl estradiol (JUNEL FE,GILDESS FE,LOESTRIN FE) 1-20 MG-MCG tablet; Take 1  tablet by mouth daily.  [name] MD, 8:56 AM 05/17/2017

## 2017-05-17 NOTE — Patient Instructions (Signed)
1. Well female exam with routine gynecological exam Normal gynecologic exam.  Pap test negative with negative high risk HPV May 2018.  Will repeat Pap test next year.  Breast exam normal.  Screening mammogram was negative on May 14, 2017.  Recommend walking as her energy level allows while on chemotherapy.  2. Encounter for surveillance of contraceptive pills Well on Junel FE 1/20.  No contraindication to birth control pills.  Junel FE 1/20 prescriptions was sent to pharmacy.  3. Malignant neoplasm of colon, unspecified part of colon ([place]) On chemotherapy followed by Dr. [name].  Other orders - norethindrone-ethinyl estradiol (JUNEL FE,GILDESS FE,LOESTRIN FE) 1-20 MG-MCG tablet; Take 1 tablet by mouth daily.  Escarlet, it was good seeing you today!

## 2017-05-18 ENCOUNTER — Ambulatory Visit: Payer: PRIVATE HEALTH INSURANCE | Admitting: Nurse Practitioner

## 2017-05-20 ENCOUNTER — Other Ambulatory Visit: Payer: Self-pay | Admitting: Oncology

## 2017-05-24 ENCOUNTER — Telehealth: Payer: Self-pay | Admitting: Oncology

## 2017-05-24 ENCOUNTER — Inpatient Hospital Stay: Payer: PRIVATE HEALTH INSURANCE

## 2017-05-24 ENCOUNTER — Encounter: Payer: Self-pay | Admitting: Oncology

## 2017-05-24 ENCOUNTER — Inpatient Hospital Stay (HOSPITAL_BASED_OUTPATIENT_CLINIC_OR_DEPARTMENT_OTHER): Payer: PRIVATE HEALTH INSURANCE | Admitting: Oncology

## 2017-05-24 VITALS — BP 142/87 | HR 75 | Temp 98.8°F | Resp 17 | Ht 64.5 in | Wt 222.1 lb

## 2017-05-24 DIAGNOSIS — C184 Malignant neoplasm of transverse colon: Secondary | ICD-10-CM

## 2017-05-24 DIAGNOSIS — C779 Secondary and unspecified malignant neoplasm of lymph node, unspecified: Secondary | ICD-10-CM | POA: Diagnosis not present

## 2017-05-24 DIAGNOSIS — R21 Rash and other nonspecific skin eruption: Secondary | ICD-10-CM | POA: Diagnosis not present

## 2017-05-24 DIAGNOSIS — Z5112 Encounter for antineoplastic immunotherapy: Secondary | ICD-10-CM | POA: Diagnosis not present

## 2017-05-24 DIAGNOSIS — C189 Malignant neoplasm of colon, unspecified: Secondary | ICD-10-CM

## 2017-05-24 DIAGNOSIS — Z95828 Presence of other vascular implants and grafts: Secondary | ICD-10-CM

## 2017-05-24 LAB — CMP (CANCER CENTER ONLY)
ALK PHOS: 528 U/L — AB (ref 40–150)
ALT: 317 U/L — AB (ref 0–55)
AST: 579 U/L — AB (ref 5–34)
Albumin: 3.4 g/dL — ABNORMAL LOW (ref 3.5–5.0)
Anion gap: 8 (ref 3–11)
BILIRUBIN TOTAL: 1.4 mg/dL — AB (ref 0.2–1.2)
BUN: 11 mg/dL (ref 7–26)
CALCIUM: 9 mg/dL (ref 8.4–10.4)
CO2: 22 mmol/L (ref 22–29)
CREATININE: 1.17 mg/dL — AB (ref 0.60–1.10)
Chloride: 109 mmol/L (ref 98–109)
GFR, EST NON AFRICAN AMERICAN: 55 mL/min — AB (ref >60)
Glucose, Bld: 148 mg/dL — ABNORMAL HIGH (ref 70–140)
Potassium: 3.9 mmol/L (ref 3.5–5.1)
Sodium: 139 mmol/L (ref 136–145)
Total Protein: 6.7 g/dL (ref 6.4–8.3)

## 2017-05-24 LAB — MAGNESIUM: MAGNESIUM: 1.4 mg/dL — AB (ref 1.7–2.4)

## 2017-05-24 MED ORDER — SODIUM CHLORIDE 0.9 % IJ SOLN
10.0000 mL | INTRAMUSCULAR | Status: DC | PRN
  Administered 2017-05-24: 10 mL via INTRAVENOUS
  Filled 2017-05-24: qty 10

## 2017-05-24 MED ORDER — HEPARIN SOD (PORK) LOCK FLUSH 100 UNIT/ML IV SOLN
500.0000 [IU] | Freq: Once | INTRAVENOUS | Status: AC | PRN
  Administered 2017-05-24: 500 [IU] via INTRAVENOUS
  Filled 2017-05-24: qty 5

## 2017-05-24 NOTE — Progress Notes (Signed)
No treatment today due to elevated liver enzymes per Dr. [name]. Per infusion RN, pt reports that when on trial at [facility] she was taking lipitor "and my liver enzymes jumped up". She also reports that she "just restarted Lipitor about 3 weeks ago". This RN will make MD aware.   Per Dr. [name], pt to hold lipitor, hold treatment today and come back next week for CMET. Infusion RN made aware, will relay info to pt.

## 2017-05-24 NOTE — Addendum Note (Signed)
Addended by: Mathis Fare on: 05/24/2017 11:18 AM   Modules accepted: Orders

## 2017-05-24 NOTE — Progress Notes (Signed)
[place] OFFICE PROGRESS NOTE   Diagnosis: Colon cancer  INTERVAL HISTORY:   Brenda Becker completed another treatment with Panitumumab on 05/10/2017.  No diarrhea.  Mild rash over the face.  Good appetite.  She is working.  Objective:  Vital signs in last 24 hours:  Blood pressure (!) 142/87, pulse 75, temperature 98.8 F (37.1 C), temperature source Oral, resp. rate 17, height 5' 4.5" (1.638 m), weight 222 lb 1.6 oz (100.7 kg), last menstrual period 05/17/2017, SpO2 100 %.    HEENT: Mild erythema at the posterior left buccal mucosa, no thrush Resp: Lungs clear bilaterally Cardio: Regular rate and rhythm GI: No hepatosplenomegaly, nontender Vascular: No leg edema  Skin: Mild acne type rash over the face and upper chest/back, fingers and toes without paronychia  Portacath/PICC-without erythema  Lab Results:  Lab Results  Component Value Date   WBC 5.6 04/12/2017   HGB 11.8 04/12/2017   HCT 34.8 04/12/2017   MCV 86.8 04/12/2017   PLT 187 04/12/2017   NEUTROABS 3.3 04/12/2017    CMP     Component Value Date/Time   NA 139 05/10/2017 0820   NA 142 06/01/2016 0925   K 3.8 05/10/2017 0820   K 3.9 06/01/2016 0925   CL 110 (H) 05/10/2017 0820   CO2 23 05/10/2017 0820   CO2 23 06/01/2016 0925   GLUCOSE 107 05/10/2017 0820   GLUCOSE 117 06/01/2016 0925   BUN 9 05/10/2017 0820   BUN 6.9 (L) 06/01/2016 0925   CREATININE 1.08 05/10/2017 0820   CREATININE 0.9 06/01/2016 0925   CALCIUM 8.9 05/10/2017 0820   CALCIUM 9.1 06/01/2016 0925   PROT 6.4 05/10/2017 0820   PROT 6.9 06/01/2016 0925   ALBUMIN 3.4 (L) 05/10/2017 0820   ALBUMIN 3.7 06/01/2016 0925   AST 33 05/10/2017 0820   AST 22 06/01/2016 0925   ALT 28 05/10/2017 0820   ALT 32 06/01/2016 0925   ALKPHOS 108 05/10/2017 0820   ALKPHOS 102 06/01/2016 0925   BILITOT 0.3 05/10/2017 0820   BILITOT 0.37 06/01/2016 0925   GFRNONAA >60 05/10/2017 0820   GFRAA >60 05/10/2017 0820     Medications: I  have reviewed the patient's current medications.   Assessment/Plan: 1. 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  Cycle 2 CAPOX 01/22/2015  Cycle 3 CAPOX 02/12/2015  Cycle 4 CAPOX 03/05/2015  Cycle 5 CAPOX 03/26/2015  Cycle 6 CAPOX 04/16/2015  Cycle 7 CAPOX 05/07/2015 (oxaliplatin held due to progressive neuropathy)  Cycle 8 CAPOX 05/28/2015 (oxaliplatin held secondary to neuropathy)  CTs 11/01/2015-no significant change and an 11 mm upper abdomen mesenteric lymph node with partial calcification-treated metastasis?, No other evidence of metastatic disease  CTs 01/21/2016-mild interval progression of previously identified calcified nodal tissue in the upper central abdomen. Development of small adjacent lymph nodes in the interval.  EUS guided biopsy of the calcified upper abdominal mass on 02/17/2016 confirmed metastatic adenocarcinoma  Cycle 1 FOLFIRI/Avastin 03/09/2016  Cycle 2 FOLFIRI/Avastin 03/23/2016  Cycle 3 FOLFIRI/Avastin 04/06/2016 (Neulasta added)  Cycle 4 FOLFIRI/Avastin 04/20/2016  Cycle 5 FOLFIRI/Avastin 05/04/2016  Cycle 6 FOLFIRI/Avastin 05/18/2016  CT abdomen/pelvis 05/30/2016-new retroperitoneal lymphadenopathy in the para-aortic region with the largest node measuring 1.5 cm. Partially calcified mesenteric lymph node measures 12 mm as compared to  14 mm previously. Other scattered less than 1 cmmesenteric lymph nodes remain stable.  PET scan 06/13/2016-hypermetabolic posterior mediastinal node, hypermetabolic retroperitoneal lymph nodes, stable transverse mesocolon mass, bilateral hypermetabolic inguinal nodes  EUS biopsy of posterior mediastinal lymph nodes on  07/20/2016-positive for metastatic adenocarcinoma  Clinical trial at [facility], [id number] Panitumumab, nivolumab, ipilimumab.  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 No change in small calcified mesenteric and retroperitoneal abdominal lymph nodes. No evidence of metastatic disease in the chest.  Maintenance panitumumab beginning 03/29/2017 2. History of Microcytic anemia-likely iron deficiency anemia secondary to #1 and ongoing menses 3. Status post cholecystectomy 4. Family history of breast and ovarian cancer. She has seen the genetics counselor. 5. Delayed nausea following cycle 1 CAPOX. Aloxi and Emend added beginning with cycle 2. 6. Transient episodes of dyspnea following cycle 4 and cycle 5 CAPOX, likely neurotoxicity from oxaliplatin 7. Oxaliplatin neuropathy-improved 8. Mild neutropenia secondary to chemotherapy-Neulasta added beginning with cycle 3 FOLFIRI/Avastin 9. Mild mucositis following cycle 4 FOLFIRI/Avastin 10. Mammogram 05/11/2016-possible mass right breast. Right breast ultrasound 05/17/2016-right breast mass at 10:00 most likely represents a cyst, however is somewhat hypoechoic and could also represent a fibroadenoma. Ultrasound-guided aspiration of a 1 cmbenign cyst 05/22/2016. 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 12. Hypomagnesia secondary to Panitumumab.  She continues magnesium oxide.  Disposition: Brenda Becker appears unchanged.  She will continue every 2-week panitumumab.  We will follow-up on the potassium and magnesium levels from today.  She is scheduled to undergo a restaging CT evaluation at [facility] on  06/04/2017.  She will return for an office visit in the next cycle of panitumumab on 06/07/2017.  15 minutes were spent with the patient today.  The majority of the time was used for counseling and coordination of care.  Brenda Coder, MD  05/24/2017  9:55 AM

## 2017-05-24 NOTE — Progress Notes (Signed)
LFT's reviewed with patient. Informed patient to go to nearest ED or contact office if she develops any issues.  Patient aware she is to return to our office next Wednesday for labwork and OV.

## 2017-05-24 NOTE — Telephone Encounter (Signed)
Scheduled appt per 5/23 los - gave patient AVS and calender per los.

## 2017-05-28 ENCOUNTER — Other Ambulatory Visit: Payer: Self-pay | Admitting: Oncology

## 2017-05-28 DIAGNOSIS — C189 Malignant neoplasm of colon, unspecified: Secondary | ICD-10-CM

## 2017-05-30 ENCOUNTER — Inpatient Hospital Stay: Payer: PRIVATE HEALTH INSURANCE

## 2017-05-30 ENCOUNTER — Telehealth: Payer: Self-pay | Admitting: Emergency Medicine

## 2017-05-30 DIAGNOSIS — C189 Malignant neoplasm of colon, unspecified: Secondary | ICD-10-CM

## 2017-05-30 DIAGNOSIS — Z5112 Encounter for antineoplastic immunotherapy: Secondary | ICD-10-CM | POA: Diagnosis not present

## 2017-05-30 LAB — CMP (CANCER CENTER ONLY)
ALT: 209 U/L — AB (ref 0–55)
AST: 171 U/L — AB (ref 5–34)
Albumin: 3.4 g/dL — ABNORMAL LOW (ref 3.5–5.0)
Alkaline Phosphatase: 400 U/L — ABNORMAL HIGH (ref 40–150)
Anion gap: 12 — ABNORMAL HIGH (ref 3–11)
BUN: 12 mg/dL (ref 7–26)
CHLORIDE: 106 mmol/L (ref 98–109)
CO2: 23 mmol/L (ref 22–29)
CREATININE: 1.13 mg/dL — AB (ref 0.60–1.10)
Calcium: 9 mg/dL (ref 8.4–10.4)
GFR, EST NON AFRICAN AMERICAN: 57 mL/min — AB (ref >60)
GFR, Est AFR Am: >60 mL/min (ref >60)
Glucose, Bld: 133 mg/dL (ref 70–140)
POTASSIUM: 4.2 mmol/L (ref 3.5–5.1)
SODIUM: 141 mmol/L (ref 136–145)
Total Bilirubin: 0.8 mg/dL (ref 0.2–1.2)
Total Protein: 6.9 g/dL (ref 6.4–8.3)

## 2017-05-30 NOTE — Telephone Encounter (Addendum)
Left vm regarding this note.   ----- Message from Ladell Pier, MD sent at 05/30/2017  3:01 PM EDT ----- Please call patient, liver enzymes are better, follow-up as scheduled

## 2017-06-03 ENCOUNTER — Other Ambulatory Visit: Payer: Self-pay | Admitting: Oncology

## 2017-06-07 ENCOUNTER — Telehealth: Payer: Self-pay | Admitting: Emergency Medicine

## 2017-06-07 ENCOUNTER — Inpatient Hospital Stay: Payer: PRIVATE HEALTH INSURANCE

## 2017-06-07 ENCOUNTER — Inpatient Hospital Stay: Payer: PRIVATE HEALTH INSURANCE | Attending: Oncology

## 2017-06-07 ENCOUNTER — Inpatient Hospital Stay (HOSPITAL_BASED_OUTPATIENT_CLINIC_OR_DEPARTMENT_OTHER): Payer: PRIVATE HEALTH INSURANCE | Admitting: Oncology

## 2017-06-07 ENCOUNTER — Telehealth: Payer: Self-pay | Admitting: Oncology

## 2017-06-07 VITALS — BP 125/84 | HR 84 | Temp 99.5°F | Resp 17 | Ht 64.5 in | Wt 224.5 lb

## 2017-06-07 DIAGNOSIS — R945 Abnormal results of liver function studies: Secondary | ICD-10-CM

## 2017-06-07 DIAGNOSIS — C189 Malignant neoplasm of colon, unspecified: Secondary | ICD-10-CM

## 2017-06-07 DIAGNOSIS — C184 Malignant neoplasm of transverse colon: Secondary | ICD-10-CM | POA: Diagnosis present

## 2017-06-07 DIAGNOSIS — Z95828 Presence of other vascular implants and grafts: Secondary | ICD-10-CM

## 2017-06-07 DIAGNOSIS — Z5112 Encounter for antineoplastic immunotherapy: Secondary | ICD-10-CM | POA: Diagnosis not present

## 2017-06-07 LAB — CMP (CANCER CENTER ONLY)
ALK PHOS: 202 U/L — AB (ref 40–150)
ALT: 63 U/L — ABNORMAL HIGH (ref 0–55)
AST: 43 U/L — AB (ref 5–34)
Albumin: 3.3 g/dL — ABNORMAL LOW (ref 3.5–5.0)
Anion gap: 11 (ref 3–11)
BILIRUBIN TOTAL: 0.5 mg/dL (ref 0.2–1.2)
BUN: 10 mg/dL (ref 7–26)
CO2: 22 mmol/L (ref 22–29)
Calcium: 8.4 mg/dL (ref 8.4–10.4)
Chloride: 108 mmol/L (ref 98–109)
Creatinine: 1.04 mg/dL (ref 0.60–1.10)
GFR, Est AFR Am: >60 mL/min (ref >60)
GFR, Estimated: >60 mL/min (ref >60)
Glucose, Bld: 141 mg/dL — ABNORMAL HIGH (ref 70–140)
POTASSIUM: 3.1 mmol/L — AB (ref 3.5–5.1)
Sodium: 141 mmol/L (ref 136–145)
TOTAL PROTEIN: 6.3 g/dL — AB (ref 6.4–8.3)

## 2017-06-07 LAB — CBC WITH DIFFERENTIAL (CANCER CENTER ONLY)
BASOS ABS: 0.1 10*3/uL (ref 0.0–0.1)
Basophils Relative: 2 %
EOS PCT: 11 %
Eosinophils Absolute: 0.5 10*3/uL (ref 0.0–0.5)
HCT: 34.6 % — ABNORMAL LOW (ref 34.8–46.6)
Hemoglobin: 11.4 g/dL — ABNORMAL LOW (ref 11.6–15.9)
LYMPHS PCT: 41 %
Lymphs Abs: 1.9 10*3/uL (ref 0.9–3.3)
MCH: 28.6 pg (ref 25.1–34.0)
MCHC: 32.9 g/dL (ref 31.5–36.0)
MCV: 86.7 fL (ref 79.5–101.0)
MONO ABS: 0.4 10*3/uL (ref 0.1–0.9)
Monocytes Relative: 8 %
Neutro Abs: 1.8 10*3/uL (ref 1.5–6.5)
Neutrophils Relative %: 38 %
PLATELETS: 195 10*3/uL (ref 145–400)
RBC: 3.99 MIL/uL (ref 3.70–5.45)
RDW: 12.9 % (ref 11.2–14.5)
WBC Count: 4.6 10*3/uL (ref 3.9–10.3)

## 2017-06-07 LAB — MAGNESIUM: MAGNESIUM: 1.2 mg/dL — AB (ref 1.7–2.4)

## 2017-06-07 MED ORDER — HEPARIN SOD (PORK) LOCK FLUSH 100 UNIT/ML IV SOLN
500.0000 [IU] | Freq: Once | INTRAVENOUS | Status: AC | PRN
  Administered 2017-06-07: 500 [IU]
  Filled 2017-06-07: qty 5

## 2017-06-07 MED ORDER — SODIUM CHLORIDE 0.9% FLUSH
10.0000 mL | INTRAVENOUS | Status: DC | PRN
  Administered 2017-06-07: 10 mL
  Filled 2017-06-07: qty 10

## 2017-06-07 MED ORDER — SODIUM CHLORIDE 0.9 % IV SOLN
6.0000 mg/kg | Freq: Once | INTRAVENOUS | Status: AC
  Administered 2017-06-07: 600 mg via INTRAVENOUS
  Filled 2017-06-07: qty 10

## 2017-06-07 MED ORDER — SODIUM CHLORIDE 0.9 % IV SOLN
Freq: Once | INTRAVENOUS | Status: AC
  Administered 2017-06-07: 09:00:00 via INTRAVENOUS

## 2017-06-07 MED ORDER — SODIUM CHLORIDE 0.9 % IJ SOLN
10.0000 mL | INTRAMUSCULAR | Status: DC | PRN
  Administered 2017-06-07: 10 mL via INTRAVENOUS
  Filled 2017-06-07: qty 10

## 2017-06-07 MED ORDER — MAGNESIUM SULFATE 4 GM/100ML IV SOLN
4.0000 g | Freq: Once | INTRAVENOUS | Status: AC
  Administered 2017-06-07: 4 g via INTRAVENOUS
  Filled 2017-06-07: qty 100

## 2017-06-07 NOTE — Telephone Encounter (Signed)
Scheduled appt per 6/6 los - 7/3 capped day - scheudled appt on 7/2 - pt is aware of appt date and time.

## 2017-06-07 NOTE — Progress Notes (Signed)
[place] OFFICE PROGRESS NOTE   Diagnosis: Colon cancer  INTERVAL HISTORY:   Brenda Becker returns as scheduled.  She feels well.  No new complaint.  Panitumumab was held on 05/24/2017 secondary to elevated liver enzymes.  She had resumed Lipitor in early May.  Lipitor was discontinued. She was seen at [facility] 06/04/2017.  Restaging CTs revealed no evidence of disease progression.  Objective:  Vital signs in last 24 hours:  Blood pressure 125/84, pulse 84, temperature 99.5 F (37.5 C), temperature source Oral, resp. rate 17, height 5' 4.5" (1.638 m), weight 224 lb 8 oz (101.8 kg), last menstrual period 05/17/2017, SpO2 100 %.    HEENT: No thrush or ulcers Resp: Lungs clear bilaterally Cardio: Regular rate and rhythm GI: No hepatomegaly, no mass, nontender Vascular: No leg edema  Skin: Mild acne type rash over the face and trunk.  No paronychia at the hands  Portacath/PICC-without erythema  Lab Results:  Lab Results  Component Value Date   WBC 4.6 06/07/2017   HGB 11.4 (L) 06/07/2017   HCT 34.6 (L) 06/07/2017   MCV 86.7 06/07/2017   PLT 195 06/07/2017   NEUTROABS 1.8 06/07/2017    CMP  Lab Results  Component Value Date   NA 141 06/07/2017   K 3.1 (L) 06/07/2017   CL 108 06/07/2017   CO2 22 06/07/2017   GLUCOSE 141 (H) 06/07/2017   BUN 10 06/07/2017   CREATININE 1.04 06/07/2017   CALCIUM 8.4 06/07/2017   PROT 6.3 (L) 06/07/2017   ALBUMIN 3.3 (L) 06/07/2017   AST 43 (H) 06/07/2017   ALT 63 (H) 06/07/2017   ALKPHOS 202 (H) 06/07/2017   BILITOT 0.5 06/07/2017   GFRNONAA >60 06/07/2017   GFRAA >60 06/07/2017    Lab Results  Component Value Date   CEA1 1.86 06/01/2016     Medications: I have reviewed the patient's current medications.   Assessment/Plan: 1. 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  Cycle 2 CAPOX 01/22/2015  Cycle 3 CAPOX 02/12/2015  Cycle 4 CAPOX 03/05/2015  Cycle 5 CAPOX 03/26/2015  Cycle 6 CAPOX 04/16/2015  Cycle 7 CAPOX 05/07/2015 (oxaliplatin held due to progressive neuropathy)  Cycle 8 CAPOX 05/28/2015 (oxaliplatin held secondary to neuropathy)  CTs 11/01/2015-no significant change and an 11 mm upper abdomen mesenteric lymph node with partial calcification-treated metastasis?, No other evidence of metastatic disease  CTs 01/21/2016-mild interval progression of previously identified calcified nodal tissue in the upper central abdomen. Development of small adjacent lymph nodes in the interval.  EUS guided biopsy of the calcified upper abdominal mass on 02/17/2016 confirmed metastatic adenocarcinoma  Cycle 1 FOLFIRI/Avastin 03/09/2016  Cycle 2 FOLFIRI/Avastin 03/23/2016  Cycle 3 FOLFIRI/Avastin 04/06/2016 (Neulasta added)  Cycle 4 FOLFIRI/Avastin 04/20/2016  Cycle 5 FOLFIRI/Avastin 05/04/2016  Cycle 6 FOLFIRI/Avastin 05/18/2016  CT abdomen/pelvis 05/30/2016-new retroperitoneal lymphadenopathy in the para-aortic region with the largest node measuring 1.5 cm. Partially calcified mesenteric lymph node measures 12 mm as compared to 14 mm previously. Other scattered less than 1 cmmesenteric lymph nodes remain stable.  PET scan 06/13/2016-hypermetabolic posterior mediastinal node, hypermetabolic retroperitoneal lymph nodes, stable transverse mesocolon mass, bilateral hypermetabolic inguinal nodes  EUS biopsy of posterior mediastinal lymph nodes on 07/20/2016-positive for metastatic adenocarcinoma  Clinical trial at [facility] Panitumumab, nivolumab,  ipilimumab.  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 No change in small calcified mesenteric and retroperitoneal abdominal lymph nodes. No evidence of metastatic disease in the chest.  Maintenance panitumumab beginning 03/29/2017, healed 05/24/2017 secondary to elevated liver enzymes-resumed 06/07/2017  [facility]aging CTs at [facility] 06/04/2017- no evidence of disease progression, unchanged scattered mesenteric/retroperitoneal lymph nodes 2. History of Microcytic anemia-likely iron deficiency anemia secondary to #1 and ongoing menses 3. Status post cholecystectomy 4. Family history of breast and ovarian cancer. She has seen the genetics counselor. 5. Delayed nausea following cycle 1 CAPOX. Aloxi and Emend added beginning with cycle 2. 6. Transient episodes of dyspnea following cycle 4 and cycle 5 CAPOX, likely neurotoxicity from oxaliplatin 7. Oxaliplatin neuropathy-improved 8. Mild neutropenia secondary to chemotherapy-Neulasta added beginning with cycle 3 FOLFIRI/Avastin 9. Mild mucositis following cycle 4 FOLFIRI/Avastin 10. Mammogram 05/11/2016-possible mass right breast. Right breast ultrasound 05/17/2016-right breast mass at 10:00 most likely represents a cyst, however is somewhat hypoechoic and could also represent a fibroadenoma. Ultrasound-guided aspiration of a 1 cmbenign cyst 05/22/2016. 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 12. Hypomagnesia secondary to Panitumumab. She continues magnesium oxide. 13. Elevated liver enzymes May 2019-likely secondary to resuming atorvastatin   Disposition: Brenda Becker appears well.  There is no clinical or CT evidence of disease progression.  The plan is to continue maintenance panitumumab.  The liver enzymes were elevated when she was here on  05/24/2017.  This is most likely secondary to atorvastatin.  Atorvastatin has been discontinued.  The liver enzymes are improved.  She will resume panitumumab today.  The potassium and magnesium remain low.  She reports running out of potassium last week and resuming this yesterday.  She will continue the potassium and magnesium supplements.  She will receive IV magnesium today.  Ms. Babin will return for an office visit and panitumumab in 2 weeks.  Betsy Coder, MD  06/07/2017  10:42 AM

## 2017-06-07 NOTE — Patient Instructions (Addendum)
[facility] Discharge Instructions for Patients Receiving Chemotherapy  Today you received the following chemotherapy agents Vectibix  To help prevent nausea and vomiting after your treatment, we encourage you to take your nausea medication as directed   If you develop nausea and vomiting that is not controlled by your nausea medication, call the clinic.   BELOW ARE SYMPTOMS THAT SHOULD BE REPORTED IMMEDIATELY:  *FEVER GREATER THAN 100.5 F  *CHILLS WITH OR WITHOUT FEVER  NAUSEA AND VOMITING THAT IS NOT CONTROLLED WITH YOUR NAUSEA MEDICATION  *UNUSUAL SHORTNESS OF BREATH  *UNUSUAL BRUISING OR BLEEDING  TENDERNESS IN MOUTH AND THROAT WITH OR WITHOUT PRESENCE OF ULCERS  *URINARY PROBLEMS  *BOWEL PROBLEMS  UNUSUAL RASH Items with * indicate a potential emergency and should be followed up as soon as possible.  Feel free to call the clinic should you have any questions or concerns. The clinic phone number is [phone].  Please show the CHEMO ALERT CARD at check-in to the Emergency Department and triage nurse.  Hypomagnesemia Hypomagnesemia is a condition in which the level of magnesium in the blood is low. Magnesium is a mineral that is found in many foods. It is used in many different processes in the body. Hypomagnesemia can affect every organ in the body. It can cause life-threatening problems. What are the causes? Causes of hypomagnesemia include:  Not getting enough magnesium in your diet.  Malnutrition.  Problems with absorbing magnesium from the intestines.  Dehydration.  Alcohol abuse.  Vomiting.  Severe diarrhea.  Some medicines, including medicines that make you urinate more.  Certain diseases, such as kidney disease, diabetes, and overactive thyroid.  What are the signs or symptoms?  Involuntary shaking or trembling of a body part (tremor).  Confusion.  Muscle weakness.  Sensitivity to light, sound, and touch.  Psychiatric  issues, such as depression, irritability, or psychosis.  Sudden tightening of muscles (muscle spasms).  Tingling in the arms and legs.  A feeling of fluttering of the heart. These symptoms are more severe if magnesium levels drop suddenly. How is this diagnosed? To make a diagnosis, your health care provider will do a physical exam and order blood and urine tests. How is this treated? Treatment will depend on the cause and the severity of your condition. It may involve:  A magnesium supplement. This can be taken in pill form. It can also be given through an IV tube. This is usually done if the condition is severe.  Changes to your diet. You may be directed to eat foods that have a lot of magnesium, such as green leafy vegetables, peas, beans, and nuts.  Eliminating alcohol from your diet.  Follow these instructions at home:  Include foods with magnesium in your diet. Foods that are rich in magnesium include green vegetables, beans, nuts and seeds, and whole grains.  Take medicines only as directed by your health care provider.  Take magnesium supplements if your health care provider instructs you to do that. Take them as directed.  Have your magnesium levels monitored as directed by your health care provider.  When you are active, drink fluids that contain electrolytes.  Keep all follow-up visits as directed by your health care provider. This is important. Contact a health care provider if:  You get worse instead of better.  Your symptoms return. Get help right away if:  Your symptoms are severe. This information is not intended to replace advice given to you by your health care provider. Make sure   you discuss any questions you have with your health care provider. Document Released: 09/14/2004 Document Revised: 05/27/2015 Document Reviewed: 08/04/2013 Elsevier Interactive Patient Education  2018 [employer].

## 2017-06-07 NOTE — Addendum Note (Signed)
Addended by: Mathis Fare on: 06/07/2017 11:19 AM   Modules accepted: Orders

## 2017-06-07 NOTE — Telephone Encounter (Signed)
Critical lab value. Mag is 1.2. MD made aware

## 2017-06-17 ENCOUNTER — Other Ambulatory Visit: Payer: Self-pay | Admitting: Oncology

## 2017-06-21 ENCOUNTER — Inpatient Hospital Stay: Payer: PRIVATE HEALTH INSURANCE

## 2017-06-21 ENCOUNTER — Inpatient Hospital Stay: Payer: PRIVATE HEALTH INSURANCE | Admitting: Nurse Practitioner

## 2017-06-21 ENCOUNTER — Telehealth: Payer: Self-pay | Admitting: Nurse Practitioner

## 2017-06-21 VITALS — BP 131/75 | HR 73 | Temp 98.8°F | Resp 17 | Ht 64.5 in | Wt 223.8 lb

## 2017-06-21 DIAGNOSIS — C189 Malignant neoplasm of colon, unspecified: Secondary | ICD-10-CM

## 2017-06-21 DIAGNOSIS — Z5112 Encounter for antineoplastic immunotherapy: Secondary | ICD-10-CM | POA: Diagnosis not present

## 2017-06-21 DIAGNOSIS — C184 Malignant neoplasm of transverse colon: Secondary | ICD-10-CM

## 2017-06-21 DIAGNOSIS — Z95828 Presence of other vascular implants and grafts: Secondary | ICD-10-CM

## 2017-06-21 LAB — CMP (CANCER CENTER ONLY)
ALT: 21 U/L (ref 0–55)
AST: 23 U/L (ref 5–34)
Albumin: 3.5 g/dL (ref 3.5–5.0)
Alkaline Phosphatase: 131 U/L (ref 40–150)
Anion gap: 7 (ref 3–11)
BILIRUBIN TOTAL: 0.4 mg/dL (ref 0.2–1.2)
BUN: 13 mg/dL (ref 7–26)
CHLORIDE: 109 mmol/L (ref 98–109)
CO2: 23 mmol/L (ref 22–29)
CREATININE: 1.19 mg/dL — AB (ref 0.60–1.10)
Calcium: 9.1 mg/dL (ref 8.4–10.4)
GFR, EST NON AFRICAN AMERICAN: 53 mL/min — AB (ref >60)
GFR, Est AFR Am: >60 mL/min (ref >60)
Glucose, Bld: 135 mg/dL (ref 70–140)
Potassium: 4 mmol/L (ref 3.5–5.1)
Sodium: 139 mmol/L (ref 136–145)
TOTAL PROTEIN: 6.3 g/dL — AB (ref 6.4–8.3)

## 2017-06-21 LAB — CBC WITH DIFFERENTIAL (CANCER CENTER ONLY)
BASOS ABS: 0.1 10*3/uL (ref 0.0–0.1)
Basophils Relative: 1 %
Eosinophils Absolute: 0.6 10*3/uL — ABNORMAL HIGH (ref 0.0–0.5)
Eosinophils Relative: 12 %
HEMATOCRIT: 35.4 % (ref 34.8–46.6)
Hemoglobin: 11.6 g/dL (ref 11.6–15.9)
LYMPHS PCT: 38 %
Lymphs Abs: 1.9 10*3/uL (ref 0.9–3.3)
MCH: 28.2 pg (ref 25.1–34.0)
MCHC: 32.8 g/dL (ref 31.5–36.0)
MCV: 86.1 fL (ref 79.5–101.0)
Monocytes Absolute: 0.5 10*3/uL (ref 0.1–0.9)
Monocytes Relative: 9 %
NEUTROS ABS: 2 10*3/uL (ref 1.5–6.5)
Neutrophils Relative %: 40 %
Platelet Count: 189 10*3/uL (ref 145–400)
RBC: 4.11 MIL/uL (ref 3.70–5.45)
RDW: 13.5 % (ref 11.2–14.5)
WBC: 4.9 10*3/uL (ref 3.9–10.3)

## 2017-06-21 LAB — MAGNESIUM: Magnesium: 1.4 mg/dL — CL (ref 1.7–2.4)

## 2017-06-21 MED ORDER — HEPARIN SOD (PORK) LOCK FLUSH 100 UNIT/ML IV SOLN
500.0000 [IU] | Freq: Once | INTRAVENOUS | Status: AC | PRN
  Administered 2017-06-21: 500 [IU]
  Filled 2017-06-21: qty 5

## 2017-06-21 MED ORDER — LIDOCAINE-PRILOCAINE 2.5-2.5 % EX CREA
TOPICAL_CREAM | CUTANEOUS | 0 refills | Status: DC
Start: 2017-06-21 — End: 2019-04-07

## 2017-06-21 MED ORDER — SODIUM CHLORIDE 0.9 % IV SOLN
Freq: Once | INTRAVENOUS | Status: AC
  Administered 2017-06-21: 11:00:00 via INTRAVENOUS

## 2017-06-21 MED ORDER — SODIUM CHLORIDE 0.9% FLUSH
10.0000 mL | INTRAVENOUS | Status: DC | PRN
  Administered 2017-06-21: 10 mL
  Filled 2017-06-21: qty 10

## 2017-06-21 MED ORDER — SODIUM CHLORIDE 0.9 % IV SOLN
6.0000 mg/kg | Freq: Once | INTRAVENOUS | Status: AC
  Administered 2017-06-21: 600 mg via INTRAVENOUS
  Filled 2017-06-21: qty 20

## 2017-06-21 MED ORDER — SODIUM CHLORIDE 0.9 % IJ SOLN
10.0000 mL | INTRAMUSCULAR | Status: DC | PRN
  Administered 2017-06-21: 10 mL via INTRAVENOUS
  Filled 2017-06-21: qty 10

## 2017-06-21 NOTE — Progress Notes (Signed)
[place] OFFICE PROGRESS NOTE   Diagnosis: Colon cancer  INTERVAL HISTORY:   B[facility]da Becker returns as scheduled.  She completed a treatment with Panitumumab 06/07/2017.  She denies nausea/vomiting.  She estimates loose stools 1-2 times a week, often diet related.  She denies abdominal pain.  She has good appetite.  Rash flares intermittently.  Objective:  Vital signs in last 24 hours:  Blood pressure 131/75, pulse 73, temperature 98.8 F (37.1 C), temperature source Oral, resp. rate 17, height 5' 4.5" (1.638 m), weight 223 lb 12.8 oz (101.5 kg), SpO2 100 %.    HEENT: No thrush or ulcers. Resp: Lungs clear bilaterally. Cardio: Regular rate and rhythm. GI: Abdomen soft and nontender.  No hepatomegaly. Vascular: No leg edema.  Skin: Acne type rash scattered over the trunk, face. Port-A-Cath without erythema.   Lab Results:  Lab Results  Component Value Date   WBC 4.9 06/21/2017   HGB 11.6 06/21/2017   HCT 35.4 06/21/2017   MCV 86.1 06/21/2017   PLT 189 06/21/2017   NEUTROABS 2.0 06/21/2017    Imaging:  No results found.  Medications: I have reviewed the patient's current medications.  Assessment/Plan: 1. 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  Cycle 2 CAPOX 01/22/2015  Cycle 3 CAPOX 02/12/2015  Cycle 4 CAPOX 03/05/2015  Cycle 5 CAPOX 03/26/2015  Cycle 6 CAPOX 04/16/2015  Cycle 7 CAPOX 05/07/2015 (oxaliplatin held due to progressive neuropathy)  Cycle 8 CAPOX 05/28/2015 (oxaliplatin held secondary to neuropathy)  CTs 11/01/2015-no significant change and an 11 mm upper abdomen mesenteric lymph node with partial calcification-treated metastasis?,  No other evidence of metastatic disease  CTs 01/21/2016-mild interval progression of previously identified calcified nodal tissue in the upper central abdomen. Development of small adjacent lymph nodes in the interval.  EUS guided biopsy of the calcified upper abdominal mass on 02/17/2016 confirmed metastatic adenocarcinoma  Cycle 1 FOLFIRI/Avastin 03/09/2016  Cycle 2 FOLFIRI/Avastin 03/23/2016  Cycle 3 FOLFIRI/Avastin 04/06/2016 (Neulasta added)  Cycle 4 FOLFIRI/Avastin 04/20/2016  Cycle 5 FOLFIRI/Avastin 05/04/2016  Cycle 6 FOLFIRI/Avastin 05/18/2016  CT abdomen/pelvis 05/30/2016-new retroperitoneal lymphadenopathy in the para-aortic region with the largest node measuring 1.5 cm. Partially calcified mesenteric lymph node measures 12 mm as compared to 14 mm previously. Other scattered less than 1 cmmesenteric lymph nodes remain stable.  PET scan 06/13/2016-hypermetabolic posterior mediastinal node, hypermetabolic retroperitoneal lymph nodes, stable transverse mesocolon mass, bilateral hypermetabolic inguinal nodes  EUS biopsy of posterior mediastinal lymph nodes on 07/20/2016-positive for metastatic adenocarcinoma  Clinical trial at [facility], [id number] Panitumumab, nivolumab, ipilimumab.  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 No change in small calcified mesenteric and retroperitoneal abdominal lymph nodes. No evidence of metastatic disease in the chest.  Maintenance panitumumab beginning 03/29/2017, healed 05/24/2017 secondary to elevated liver enzymes-resumed 06/07/2017  Restaging CTs at [facility] 06/04/2017- no evidence of disease progression, unchanged scattered  mesenteric/retroperitoneal lymph nodes  Panitumumab every 2 weeks 2. History of Microcytic anemia-likely iron deficiency anemia secondary to #1 and ongoing menses 3. Status post cholecystectomy 4. Family  history of breast and ovarian cancer. She has seen the genetics counselor. 5. Delayed nausea following cycle 1 CAPOX. Aloxi and Emend added beginning with cycle 2. 6. Transient episodes of dyspnea following cycle 4 and cycle 5 CAPOX, likely neurotoxicity from oxaliplatin 7. Oxaliplatin neuropathy-improved 8. Mild neutropenia secondary to chemotherapy-Neulasta added beginning with cycle 3 FOLFIRI/Avastin 9. Mild mucositis following cycle 4 FOLFIRI/Avastin 10. Mammogram 05/11/2016-possible mass right breast. Right breast ultrasound 05/17/2016-right breast mass at 10:00 most likely represents a cyst, however is somewhat hypoechoic and could also represent a fibroadenoma. Ultrasound-guided aspiration of a 1 cmbenign cyst 05/22/2016. 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 12. Hypomagnesia secondary to Panitumumab. She continues magnesium oxide. 13. Elevated liver enzymes May 2019-likely secondary to resuming atorvastatin     Disposition: Brenda Becker appears stable.  She resumed Panitumumab 2 weeks ago.  Today the liver enzymes are within normal limits.  Plan to proceed with Panitumumab today as scheduled.  She will return for lab, follow-up and Panitumumab in 2 weeks.  She will contact the office in the interim with any problems.    [name]   06/21/2017  9:58 AM

## 2017-06-21 NOTE — Patient Instructions (Signed)
[place] Discharge Instructions for Patients Receiving Chemotherapy  Today you received the following chemotherapy agents Vectibix  To help prevent nausea and vomiting after your treatment, we encourage you to take your nausea medication as directed   If you develop nausea and vomiting that is not controlled by your nausea medication, call the clinic.   BELOW ARE SYMPTOMS THAT SHOULD BE REPORTED IMMEDIATELY:  *FEVER GREATER THAN 100.5 F  *CHILLS WITH OR WITHOUT FEVER  NAUSEA AND VOMITING THAT IS NOT CONTROLLED WITH YOUR NAUSEA MEDICATION  *UNUSUAL SHORTNESS OF BREATH  *UNUSUAL BRUISING OR BLEEDING  TENDERNESS IN MOUTH AND THROAT WITH OR WITHOUT PRESENCE OF ULCERS  *URINARY PROBLEMS  *BOWEL PROBLEMS  UNUSUAL RASH Items with * indicate a potential emergency and should be followed up as soon as possible.  Feel free to call the clinic should you have any questions or concerns. The clinic phone number is [phone].  Please show the [place] at check-in to the Emergency Department and triage nurse.  Hypomagnesemia Hypomagnesemia is a condition in which the level of magnesium in the blood is low. Magnesium is a mineral that is found in many foods. It is used in many different processes in the body. Hypomagnesemia can affect every organ in the body. It can cause life-threatening problems. What are the causes? Causes of hypomagnesemia include:  Not getting enough magnesium in your diet.  Malnutrition.  Problems with absorbing magnesium from the intestines.  Dehydration.  Alcohol abuse.  Vomiting.  Severe diarrhea.  Some medicines, including medicines that make you urinate more.  Certain diseases, such as kidney disease, diabetes, and overactive thyroid.  What are the signs or symptoms?  Involuntary shaking or trembling of a body part (tremor).  Confusion.  Muscle weakness.  Sensitivity to light, sound, and touch.  Psychiatric  issues, such as depression, irritability, or psychosis.  Sudden tightening of muscles (muscle spasms).  Tingling in the arms and legs.  A feeling of fluttering of the heart. These symptoms are more severe if magnesium levels drop suddenly. How is this diagnosed? To make a diagnosis, your health care provider will do a physical exam and order blood and urine tests. How is this treated? Treatment will depend on the cause and the severity of your condition. It may involve:  A magnesium supplement. This can be taken in pill form. It can also be given through an IV tube. This is usually done if the condition is severe.  Changes to your diet. You may be directed to eat foods that have a lot of magnesium, such as green leafy vegetables, peas, beans, and nuts.  Eliminating alcohol from your diet.  Follow these instructions at home:  Include foods with magnesium in your diet. Foods that are rich in magnesium include green vegetables, beans, nuts and seeds, and whole grains.  Take medicines only as directed by your health care provider.  Take magnesium supplements if your health care provider instructs you to do that. Take them as directed.  Have your magnesium levels monitored as directed by your health care provider.  When you are active, drink fluids that contain electrolytes.  Keep all follow-up visits as directed by your health care provider. This is important. Contact a health care provider if:  You get worse instead of better.  Your symptoms return. Get help right away if:  Your symptoms are severe. This information is not intended to replace advice given to you by your health care provider. Make sure  you discuss any questions you have with your health care provider. Document Released: 09/14/2004 Document Revised: 05/27/2015 Document Reviewed: 08/04/2013 Elsevier Interactive Patient Education  2018 [employer].

## 2017-06-21 NOTE — Telephone Encounter (Signed)
Scheduled appt per 6/20 los - pt to get an updated schedule via my chart.

## 2017-07-01 ENCOUNTER — Other Ambulatory Visit: Payer: Self-pay | Admitting: Oncology

## 2017-07-03 ENCOUNTER — Inpatient Hospital Stay: Payer: PRIVATE HEALTH INSURANCE

## 2017-07-03 ENCOUNTER — Inpatient Hospital Stay: Payer: PRIVATE HEALTH INSURANCE | Admitting: Oncology

## 2017-07-03 ENCOUNTER — Inpatient Hospital Stay: Payer: PRIVATE HEALTH INSURANCE | Attending: Oncology

## 2017-07-03 DIAGNOSIS — C189 Malignant neoplasm of colon, unspecified: Secondary | ICD-10-CM

## 2017-07-03 DIAGNOSIS — R21 Rash and other nonspecific skin eruption: Secondary | ICD-10-CM | POA: Diagnosis not present

## 2017-07-03 DIAGNOSIS — C184 Malignant neoplasm of transverse colon: Secondary | ICD-10-CM

## 2017-07-03 DIAGNOSIS — K1379 Other lesions of oral mucosa: Secondary | ICD-10-CM | POA: Diagnosis not present

## 2017-07-03 DIAGNOSIS — Z5112 Encounter for antineoplastic immunotherapy: Secondary | ICD-10-CM | POA: Diagnosis not present

## 2017-07-03 LAB — CMP (CANCER CENTER ONLY)
ALK PHOS: 119 U/L (ref 38–126)
ALT: 21 U/L (ref 0–44)
AST: 20 U/L (ref 15–41)
Albumin: 3.7 g/dL (ref 3.5–5.0)
Anion gap: 7 (ref 5–15)
BUN: 14 mg/dL (ref 6–20)
CALCIUM: 8.2 mg/dL — AB (ref 8.9–10.3)
CHLORIDE: 108 mmol/L (ref 98–111)
CO2: 24 mmol/L (ref 22–32)
CREATININE: 1.18 mg/dL — AB (ref 0.44–1.00)
GFR, Est AFR Am: >60 mL/min (ref >60)
GFR, Estimated: 54 mL/min — ABNORMAL LOW (ref >60)
Glucose, Bld: 106 mg/dL — ABNORMAL HIGH (ref 70–99)
Potassium: 3.6 mmol/L (ref 3.5–5.1)
Sodium: 139 mmol/L (ref 135–145)
Total Bilirubin: 0.4 mg/dL (ref 0.3–1.2)
Total Protein: 6.5 g/dL (ref 6.5–8.1)

## 2017-07-03 LAB — MAGNESIUM: MAGNESIUM: 1.2 mg/dL — AB (ref 1.7–2.4)

## 2017-07-03 MED ORDER — SODIUM CHLORIDE 0.9 % IV SOLN
6.0000 mg/kg | Freq: Once | INTRAVENOUS | Status: AC
  Administered 2017-07-03: 600 mg via INTRAVENOUS
  Filled 2017-07-03: qty 20

## 2017-07-03 MED ORDER — SODIUM CHLORIDE 0.9% FLUSH
10.0000 mL | INTRAVENOUS | Status: DC | PRN
  Administered 2017-07-03: 10 mL
  Filled 2017-07-03: qty 10

## 2017-07-03 MED ORDER — POTASSIUM CHLORIDE CRYS ER 20 MEQ PO TBCR
EXTENDED_RELEASE_TABLET | ORAL | 0 refills | Status: DC
Start: 2017-07-03 — End: 2017-08-02

## 2017-07-03 MED ORDER — HEPARIN SOD (PORK) LOCK FLUSH 100 UNIT/ML IV SOLN
500.0000 [IU] | Freq: Once | INTRAVENOUS | Status: AC | PRN
  Administered 2017-07-03: 500 [IU]
  Filled 2017-07-03: qty 5

## 2017-07-03 MED ORDER — MAGNESIUM 400 MG PO CAPS: 400.0000 mg | ORAL_CAPSULE | Freq: Four times a day (QID) | ORAL | 1 refills | Status: DC

## 2017-07-03 MED ORDER — SODIUM CHLORIDE 0.9 % IV SOLN
Freq: Once | INTRAVENOUS | Status: AC
  Administered 2017-07-03: 10:00:00 via INTRAVENOUS

## 2017-07-03 NOTE — Progress Notes (Signed)
Ok to treat with Mag of 1.2 per Dr. [name]

## 2017-07-03 NOTE — Patient Instructions (Signed)
[facility] Discharge Instructions for Patients Receiving Chemotherapy  Today you received the following chemotherapy agents: Vectibix.  To help prevent nausea and vomiting after your treatment, we encourage you to take your nausea medication as directed.   If you develop nausea and vomiting that is not controlled by your nausea medication, call the clinic.   BELOW ARE SYMPTOMS THAT SHOULD BE REPORTED IMMEDIATELY:  *FEVER GREATER THAN 100.5 F  *CHILLS WITH OR WITHOUT FEVER  NAUSEA AND VOMITING THAT IS NOT CONTROLLED WITH YOUR NAUSEA MEDICATION  *UNUSUAL SHORTNESS OF BREATH  *UNUSUAL BRUISING OR BLEEDING  TENDERNESS IN MOUTH AND THROAT WITH OR WITHOUT PRESENCE OF ULCERS  *URINARY PROBLEMS  *BOWEL PROBLEMS  UNUSUAL RASH Items with * indicate a potential emergency and should be followed up as soon as possible.  Feel free to call the clinic should you have any questions or concerns. The clinic phone number is [phone].  Please show the CHEMO ALERT CARD at check-in to the Emergency Department and triage nurse.   

## 2017-07-03 NOTE — Progress Notes (Signed)
[place] OFFICE PROGRESS NOTE   Diagnosis: Colon cancer  INTERVAL HISTORY:   Brenda Becker returns as scheduled.  She completed another treatment with Panitumumab on 06/21/2017.  She has developed one ulcer at the palate.  Her daughter has a similar lesion.  The skin rash is unchanged.  No assistant diarrhea.  She is taking potassium and magnesium.  Objective:  Vital signs in last 24 hours:  Blood pressure 128/74, pulse 87, temperature 99 F (37.2 C), temperature source Oral, resp. rate 18, height 5' 4.5" (1.638 m), weight 221 lb 6.4 oz (100.4 kg), SpO2 100 %.    HEENT: Ulceration at the posterior palate.  No other ulcers.  No thrush Resp: Lungs clear bilaterally Cardio: Regular rate and rhythm GI: Nontender, no hepatomegaly Vascular: Leg edema  Skin: Scattered acne type rash over the face and trunk  Portacath/PICC-without erythema  Lab Results:  Lab Results  Component Value Date   WBC 4.9 06/21/2017   HGB 11.6 06/21/2017   HCT 35.4 06/21/2017   MCV 86.1 06/21/2017   PLT 189 06/21/2017   NEUTROABS 2.0 06/21/2017    CMP  Lab Results  Component Value Date   NA 139 07/03/2017   K 3.6 07/03/2017   CL 108 07/03/2017   CO2 24 07/03/2017   GLUCOSE 106 (H) 07/03/2017   BUN 14 07/03/2017   CREATININE 1.18 (H) 07/03/2017   CALCIUM 8.2 (L) 07/03/2017   PROT 6.5 07/03/2017   ALBUMIN 3.7 07/03/2017   AST 20 07/03/2017   ALT 21 07/03/2017   ALKPHOS 119 07/03/2017   BILITOT 0.4 07/03/2017   GFRNONAA 54 (L) 07/03/2017   GFRAA >60 07/03/2017    Lab Results  Component Value Date   CEA1 1.86 06/01/2016     Medications: I have reviewed the patient's current medications.   Assessment/Plan: 1. 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  Cycle 2 CAPOX 01/22/2015  Cycle 3 CAPOX 02/12/2015  Cycle 4 CAPOX 03/05/2015  Cycle 5 CAPOX 03/26/2015  Cycle 6 CAPOX 04/16/2015  Cycle 7 CAPOX 05/07/2015 (oxaliplatin held due to progressive neuropathy)  Cycle 8 CAPOX 05/28/2015 (oxaliplatin held secondary to neuropathy)  CTs 11/01/2015-no significant change and an 11 mm upper abdomen mesenteric lymph node with partial calcification-treated metastasis?, No other evidence of metastatic disease  CTs 01/21/2016-mild interval progression of previously identified calcified nodal tissue in the upper central abdomen. Development of small adjacent lymph nodes in the interval.  EUS guided biopsy of the calcified upper abdominal mass on 02/17/2016 confirmed metastatic adenocarcinoma  Cycle 1 FOLFIRI/Avastin 03/09/2016  Cycle 2 FOLFIRI/Avastin 03/23/2016  Cycle 3 FOLFIRI/Avastin 04/06/2016 (Neulasta added)  Cycle 4 FOLFIRI/Avastin 04/20/2016  Cycle 5 FOLFIRI/Avastin 05/04/2016  Cycle 6 FOLFIRI/Avastin 05/18/2016  CT abdomen/pelvis 05/30/2016-new retroperitoneal lymphadenopathy in the para-aortic region with the largest node measuring 1.5 cm. Partially calcified mesenteric lymph node measures 12 mm as compared to 14 mm previously. Other scattered less than 1 cmmesenteric lymph nodes remain stable.  PET scan 06/13/2016-hypermetabolic posterior mediastinal node, hypermetabolic retroperitoneal lymph nodes, stable transverse mesocolon mass, bilateral hypermetabolic inguinal nodes  EUS biopsy of posterior mediastinal lymph nodes on 07/20/2016-positive for metastatic adenocarcinoma  Clinical trial at [facility], [id number] Panitumumab, nivolumab, ipilimumab.  Restaging CTs 12/18/2017-decreased size of right lower lobe nodules, mediastinal, retroperitoneal, and  mesenteric lymph nodes consistent with a response to therapy  Treatment placed on hold February  2019 due to immunotherapy-induced hepatitis  Restaging CTs 03/12/2017-no progressive findings to suggest new recurrent or metastatic disease in the abdomen or pelvis. No change in small calcified mesenteric and retroperitoneal abdominal lymph nodes. No evidence of metastatic disease in the chest.  Maintenance panitumumab beginning 03/29/2017, healed 05/24/2017 secondary to elevated liver enzymes-resumed 06/07/2017  Restaging CTs at [facility] 06/04/2017- no evidence of disease progression, unchanged scattered mesenteric/retroperitoneal lymph nodes  Panitumumab every 2 weeks 2. History of Microcytic anemia-likely iron deficiency anemia secondary to #1 and ongoing menses 3. Status post cholecystectomy 4. Family history of breast and ovarian cancer. She has seen the genetics counselor. 5. Delayed nausea following cycle 1 CAPOX. Aloxi and Emend added beginning with cycle 2. 6. Transient episodes of dyspnea following cycle 4 and cycle 5 CAPOX, likely neurotoxicity from oxaliplatin 7. Oxaliplatin neuropathy-improved 8. Mild neutropenia secondary to chemotherapy-Neulasta added beginning with cycle 3 FOLFIRI/Avastin 9. Mild mucositis following cycle 4 FOLFIRI/Avastin 10. Mammogram 05/11/2016-possible mass right breast. Right breast ultrasound 05/17/2016-right breast mass at 10:00 most likely represents a cyst, however is somewhat hypoechoic and could also represent a fibroadenoma. Ultrasound-guided aspiration of a 1 cmbenign cyst 05/22/2016. 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 12. Hypomagnesia secondary to Panitumumab. She continues magnesium oxide. 13. Elevated liver enzymes May 2019-likely secondary to resuming atorvastatin, now normal   Disposition: Brenda Becker appears well.  She is tolerating panitumumab well.  The liver enzymes have normalized.  She will continue every 2-week panitumumab.  I suspect the palate ulcer may be unrelated to Panitumumab.  The ulcer appears to  be healing.  She will use a mouthwash and Orajel as needed.  She will return for an office visit in 2 weeks.  15 minutes were spent with the patient today.  The majority of the time was used for counseling and coordination of care.  Betsy Coder, MD  07/03/2017  2:17 PM

## 2017-07-16 ENCOUNTER — Other Ambulatory Visit: Payer: Self-pay | Admitting: Oncology

## 2017-07-19 ENCOUNTER — Inpatient Hospital Stay: Payer: PRIVATE HEALTH INSURANCE

## 2017-07-19 ENCOUNTER — Inpatient Hospital Stay (HOSPITAL_BASED_OUTPATIENT_CLINIC_OR_DEPARTMENT_OTHER): Payer: PRIVATE HEALTH INSURANCE | Admitting: Oncology

## 2017-07-19 VITALS — BP 123/84 | HR 72 | Temp 98.4°F | Resp 18 | Ht 64.5 in | Wt 221.7 lb

## 2017-07-19 DIAGNOSIS — C184 Malignant neoplasm of transverse colon: Secondary | ICD-10-CM

## 2017-07-19 DIAGNOSIS — C189 Malignant neoplasm of colon, unspecified: Secondary | ICD-10-CM

## 2017-07-19 DIAGNOSIS — R21 Rash and other nonspecific skin eruption: Secondary | ICD-10-CM | POA: Diagnosis not present

## 2017-07-19 DIAGNOSIS — Z5112 Encounter for antineoplastic immunotherapy: Secondary | ICD-10-CM | POA: Diagnosis not present

## 2017-07-19 DIAGNOSIS — Z95828 Presence of other vascular implants and grafts: Secondary | ICD-10-CM

## 2017-07-19 LAB — CBC WITH DIFFERENTIAL (CANCER CENTER ONLY)
BASOS PCT: 1 %
Basophils Absolute: 0.1 10*3/uL (ref 0.0–0.1)
EOS ABS: 0.7 10*3/uL — AB (ref 0.0–0.5)
Eosinophils Relative: 12 %
HEMATOCRIT: 36.2 % (ref 34.8–46.6)
Hemoglobin: 12.2 g/dL (ref 11.6–15.9)
LYMPHS ABS: 2 10*3/uL (ref 0.9–3.3)
Lymphocytes Relative: 33 %
MCH: 27.9 pg (ref 25.1–34.0)
MCHC: 33.6 g/dL (ref 31.5–36.0)
MCV: 83.1 fL (ref 79.5–101.0)
MONO ABS: 0.4 10*3/uL (ref 0.1–0.9)
MONOS PCT: 6 %
NEUTROS ABS: 3 10*3/uL (ref 1.5–6.5)
Neutrophils Relative %: 48 %
PLATELETS: 192 10*3/uL (ref 145–400)
RBC: 4.36 MIL/uL (ref 3.70–5.45)
RDW: 14.3 % (ref 11.2–14.5)
WBC Count: 6.1 10*3/uL (ref 3.9–10.3)

## 2017-07-19 LAB — CMP (CANCER CENTER ONLY)
ALT: 24 U/L (ref 0–44)
AST: 21 U/L (ref 15–41)
Albumin: 3.5 g/dL (ref 3.5–5.0)
Alkaline Phosphatase: 115 U/L (ref 38–126)
Anion gap: 10 (ref 5–15)
BILIRUBIN TOTAL: 0.4 mg/dL (ref 0.3–1.2)
BUN: 13 mg/dL (ref 6–20)
CALCIUM: 8.7 mg/dL — AB (ref 8.9–10.3)
CO2: 23 mmol/L (ref 22–32)
CREATININE: 1.22 mg/dL — AB (ref 0.44–1.00)
Chloride: 109 mmol/L (ref 98–111)
GFR, EST NON AFRICAN AMERICAN: 52 mL/min — AB (ref >60)
Glucose, Bld: 112 mg/dL — ABNORMAL HIGH (ref 70–99)
Potassium: 3.8 mmol/L (ref 3.5–5.1)
Sodium: 142 mmol/L (ref 135–145)
TOTAL PROTEIN: 6.5 g/dL (ref 6.5–8.1)

## 2017-07-19 LAB — MAGNESIUM: MAGNESIUM: 1.3 mg/dL — AB (ref 1.7–2.4)

## 2017-07-19 MED ORDER — SODIUM CHLORIDE 0.9 % IV SOLN
6.0000 mg/kg | Freq: Once | INTRAVENOUS | Status: AC
  Administered 2017-07-19: 600 mg via INTRAVENOUS
  Filled 2017-07-19: qty 20

## 2017-07-19 MED ORDER — SODIUM CHLORIDE 0.9 % IV SOLN
Freq: Once | INTRAVENOUS | Status: AC
  Administered 2017-07-19: 09:00:00 via INTRAVENOUS

## 2017-07-19 MED ORDER — HEPARIN SOD (PORK) LOCK FLUSH 100 UNIT/ML IV SOLN
500.0000 [IU] | Freq: Once | INTRAVENOUS | Status: AC | PRN
Start: 2017-07-19 — End: 2017-07-19
  Administered 2017-07-19: 500 [IU]
  Filled 2017-07-19: qty 5

## 2017-07-19 MED ORDER — SODIUM CHLORIDE 0.9% FLUSH
10.0000 mL | INTRAVENOUS | Status: DC | PRN
Start: 2017-07-19 — End: 2017-07-19
  Administered 2017-07-19: 10 mL
  Filled 2017-07-19: qty 10

## 2017-07-19 MED ORDER — SODIUM CHLORIDE 0.9 % IJ SOLN
10.0000 mL | INTRAMUSCULAR | Status: DC | PRN
  Administered 2017-07-19: 10 mL via INTRAVENOUS
  Filled 2017-07-19: qty 10

## 2017-07-19 NOTE — Progress Notes (Signed)
Ok to treat with Magnesium of 1.3 per Dr. [name]

## 2017-07-19 NOTE — Progress Notes (Signed)
[place] OFFICE PROGRESS NOTE   Diagnosis: Colon cancer  INTERVAL HISTORY:   Brenda Becker completed another treatment with Panitumumab on 07/03/2017.  The mouth ulcer has resolved.  She feels well.  No new complaint.  Stable skin rash.  Objective:  Vital signs in last 24 hours:  Blood pressure 123/84, pulse 72, temperature 98.4 F (36.9 C), temperature source Oral, resp. rate 18, height 5' 4.5" (1.638 m), weight 221 lb 11.2 oz (100.6 kg), SpO2 100 %.    HEENT: No thrush or ulcers Resp: Lungs clear bilaterally Cardio: Regular rate and rhythm GI: No hepatomegaly, nontender Vascular: No leg edema  Skin: Hands without erythema or skin breakdown, acne type rash over the face and trunk  Portacath/PICC-without erythema  Lab Results:  Lab Results  Component Value Date   WBC 6.1 07/19/2017   HGB 12.2 07/19/2017   HCT 36.2 07/19/2017   MCV 83.1 07/19/2017   PLT 192 07/19/2017   NEUTROABS 3.0 07/19/2017    CMP  Lab Results  Component Value Date   NA 142 07/19/2017   K 3.8 07/19/2017   CL 109 07/19/2017   CO2 23 07/19/2017   GLUCOSE 112 (H) 07/19/2017   BUN 13 07/19/2017   CREATININE 1.22 (H) 07/19/2017   CALCIUM 8.7 (L) 07/19/2017   PROT 6.5 07/19/2017   ALBUMIN 3.5 07/19/2017   AST 21 07/19/2017   ALT 24 07/19/2017   ALKPHOS 115 07/19/2017   BILITOT 0.4 07/19/2017   GFRNONAA 52 (L) 07/19/2017   GFRAA >60 07/19/2017    Lab Results  Component Value Date   CEA1 1.86 06/01/2016     Medications: I have reviewed the patient's current medications.   Assessment/Plan: 1. 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  Cycle 2 CAPOX 01/22/2015  Cycle 3  CAPOX 02/12/2015  Cycle 4 CAPOX 03/05/2015  Cycle 5 CAPOX 03/26/2015  Cycle 6 CAPOX 04/16/2015  Cycle 7 CAPOX 05/07/2015 (oxaliplatin held due to progressive neuropathy)  Cycle 8 CAPOX 05/28/2015 (oxaliplatin held secondary to neuropathy)  CTs 11/01/2015-no significant change and an 11 mm upper abdomen mesenteric lymph node with partial calcification-treated metastasis?, No other evidence of metastatic disease  CTs 01/21/2016-mild interval progression of previously identified calcified nodal tissue in the upper central abdomen. Development of small adjacent lymph nodes in the interval.  EUS guided biopsy of the calcified upper abdominal mass on 02/17/2016 confirmed metastatic adenocarcinoma  Cycle 1 FOLFIRI/Avastin 03/09/2016  Cycle 2 FOLFIRI/Avastin 03/23/2016  Cycle 3 FOLFIRI/Avastin 04/06/2016 (Neulasta added)  Cycle 4 FOLFIRI/Avastin 04/20/2016  Cycle 5 FOLFIRI/Avastin 05/04/2016  Cycle 6 FOLFIRI/Avastin 05/18/2016  CT abdomen/pelvis 05/30/2016-new retroperitoneal lymphadenopathy in the para-aortic region with the largest node measuring 1.5 cm. Partially calcified mesenteric lymph node measures 12 mm as compared to 14 mm previously. Other scattered less than 1 cmmesenteric lymph nodes remain stable.  PET scan 06/13/2016-hypermetabolic posterior mediastinal node, hypermetabolic retroperitoneal lymph nodes, stable transverse mesocolon mass, bilateral hypermetabolic inguinal nodes  EUS biopsy of posterior mediastinal lymph nodes on 07/20/2016-positive for metastatic adenocarcinoma  Clinical trial at [facility], [id number] Panitumumab, nivolumab, ipilimumab.  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 No change in small calcified mesenteric and retroperitoneal abdominal lymph nodes. No evidence of metastatic disease in the chest.  Maintenance panitumumab beginning 03/29/2017, healed 05/24/2017 secondary to elevated liver enzymes-resumed 06/07/2017  Restaging CTs at [facility] 06/04/2017-no evidence of disease progression, unchanged scattered mesenteric/retroperitoneal lymph nodes  Panitumumab every 2 weeks 2. History of Microcytic anemia-likely iron deficiency anemia secondary to #1 and ongoing menses 3. Status post cholecystectomy 4. Family history of breast and ovarian cancer. She has seen the genetics counselor. 5. Delayed nausea following cycle 1 CAPOX. Aloxi and Emend added beginning with cycle 2. 6. Transient episodes of dyspnea following cycle 4 and cycle 5 CAPOX, likely neurotoxicity from oxaliplatin 7. Oxaliplatin neuropathy-improved 8. Mild neutropenia secondary to chemotherapy-Neulasta added beginning with cycle 3 FOLFIRI/Avastin 9. Mild mucositis following cycle 4 FOLFIRI/Avastin 10. Mammogram 05/11/2016-possible mass right breast. Right breast ultrasound 05/17/2016-right breast mass at 10:00 most likely represents a cyst, however is somewhat hypoechoic and could also represent a fibroadenoma. Ultrasound-guided aspiration of a 1 cmbenign cyst 05/22/2016. 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 12. Hypomagnesia secondary to Panitumumab. She continues magnesium oxide. 13. Elevated liver enzymes May 2019-likely secondary to resuming atorvastatin, now normal   Disposition: [facility] Becker appears unchanged.  She will continue every 2-week panitumumab.  She will undergo restaging CTs at the end of August or early September.  She continues potassium and magnesium supplements.  She will return for an office visit in 2 weeks.  15 minutes were spent with the patient today.  The majority of the time was used for  counseling and coordination of care.  Betsy Coder, MD  07/19/2017  9:19 AM

## 2017-07-19 NOTE — Patient Instructions (Signed)
[facility] Discharge Instructions for Patients Receiving Chemotherapy  Today you received the following chemotherapy agents: Vectibix.  To help prevent nausea and vomiting after your treatment, we encourage you to take your nausea medication as directed.   If you develop nausea and vomiting that is not controlled by your nausea medication, call the clinic.   BELOW ARE SYMPTOMS THAT SHOULD BE REPORTED IMMEDIATELY:  *FEVER GREATER THAN 100.5 F  *CHILLS WITH OR WITHOUT FEVER  NAUSEA AND VOMITING THAT IS NOT CONTROLLED WITH YOUR NAUSEA MEDICATION  *UNUSUAL SHORTNESS OF BREATH  *UNUSUAL BRUISING OR BLEEDING  TENDERNESS IN MOUTH AND THROAT WITH OR WITHOUT PRESENCE OF ULCERS  *URINARY PROBLEMS  *BOWEL PROBLEMS  UNUSUAL RASH Items with * indicate a potential emergency and should be followed up as soon as possible.  Feel free to call the clinic should you have any questions or concerns. The clinic phone number is [phone].  Please show the CHEMO ALERT CARD at check-in to the Emergency Department and triage nurse.   

## 2017-07-29 ENCOUNTER — Other Ambulatory Visit: Payer: Self-pay | Admitting: Oncology

## 2017-08-02 ENCOUNTER — Inpatient Hospital Stay: Payer: PRIVATE HEALTH INSURANCE | Admitting: Nurse Practitioner

## 2017-08-02 ENCOUNTER — Inpatient Hospital Stay: Payer: PRIVATE HEALTH INSURANCE

## 2017-08-02 ENCOUNTER — Encounter: Payer: Self-pay | Admitting: Nurse Practitioner

## 2017-08-02 ENCOUNTER — Inpatient Hospital Stay: Payer: PRIVATE HEALTH INSURANCE | Attending: Oncology

## 2017-08-02 ENCOUNTER — Telehealth: Payer: Self-pay | Admitting: Nurse Practitioner

## 2017-08-02 VITALS — BP 128/85 | HR 68 | Temp 99.4°F | Resp 17 | Ht 64.5 in | Wt 222.4 lb

## 2017-08-02 DIAGNOSIS — C189 Malignant neoplasm of colon, unspecified: Secondary | ICD-10-CM

## 2017-08-02 DIAGNOSIS — C184 Malignant neoplasm of transverse colon: Secondary | ICD-10-CM

## 2017-08-02 DIAGNOSIS — Z5112 Encounter for antineoplastic immunotherapy: Secondary | ICD-10-CM | POA: Diagnosis present

## 2017-08-02 DIAGNOSIS — R21 Rash and other nonspecific skin eruption: Secondary | ICD-10-CM

## 2017-08-02 DIAGNOSIS — Z95828 Presence of other vascular implants and grafts: Secondary | ICD-10-CM

## 2017-08-02 LAB — CMP (CANCER CENTER ONLY)
ALT: 21 U/L (ref 0–44)
AST: 19 U/L (ref 15–41)
Albumin: 3.5 g/dL (ref 3.5–5.0)
Alkaline Phosphatase: 95 U/L (ref 38–126)
Anion gap: 9 (ref 5–15)
BUN: 13 mg/dL (ref 6–20)
CALCIUM: 8.8 mg/dL — AB (ref 8.9–10.3)
CO2: 22 mmol/L (ref 22–32)
CREATININE: 1.13 mg/dL — AB (ref 0.44–1.00)
Chloride: 110 mmol/L (ref 98–111)
GFR, Estimated: 57 mL/min — ABNORMAL LOW (ref >60)
Glucose, Bld: 110 mg/dL — ABNORMAL HIGH (ref 70–99)
Potassium: 4 mmol/L (ref 3.5–5.1)
SODIUM: 141 mmol/L (ref 135–145)
Total Bilirubin: 0.4 mg/dL (ref 0.3–1.2)
Total Protein: 6.5 g/dL (ref 6.5–8.1)

## 2017-08-02 LAB — CBC WITH DIFFERENTIAL (CANCER CENTER ONLY)
BASOS PCT: 1 %
Basophils Absolute: 0 10*3/uL (ref 0.0–0.1)
EOS PCT: 12 %
Eosinophils Absolute: 0.6 10*3/uL — ABNORMAL HIGH (ref 0.0–0.5)
HCT: 35.5 % (ref 34.8–46.6)
Hemoglobin: 11.6 g/dL (ref 11.6–15.9)
LYMPHS ABS: 2 10*3/uL (ref 0.9–3.3)
Lymphocytes Relative: 36 %
MCH: 27.8 pg (ref 25.1–34.0)
MCHC: 32.7 g/dL (ref 31.5–36.0)
MCV: 84.9 fL (ref 79.5–101.0)
MONO ABS: 0.3 10*3/uL (ref 0.1–0.9)
MONOS PCT: 5 %
NEUTROS PCT: 46 %
Neutro Abs: 2.5 10*3/uL (ref 1.5–6.5)
Platelet Count: 205 10*3/uL (ref 145–400)
RBC: 4.18 MIL/uL (ref 3.70–5.45)
RDW: 14.2 % (ref 11.2–14.5)
WBC Count: 5.4 10*3/uL (ref 3.9–10.3)

## 2017-08-02 LAB — MAGNESIUM: Magnesium: 1.2 mg/dL — CL (ref 1.7–2.4)

## 2017-08-02 MED ORDER — HEPARIN SOD (PORK) LOCK FLUSH 100 UNIT/ML IV SOLN
500.0000 [IU] | Freq: Once | INTRAVENOUS | Status: AC | PRN
  Administered 2017-08-02: 500 [IU]
  Filled 2017-08-02: qty 5

## 2017-08-02 MED ORDER — PANITUMUMAB CHEMO INJECTION 100 MG/5ML
6.0000 mg/kg | Freq: Once | INTRAVENOUS | Status: AC
  Administered 2017-08-02: 600 mg via INTRAVENOUS
  Filled 2017-08-02: qty 20

## 2017-08-02 MED ORDER — SODIUM CHLORIDE 0.9% FLUSH
10.0000 mL | INTRAVENOUS | Status: DC | PRN
  Administered 2017-08-02: 10 mL
  Filled 2017-08-02: qty 10

## 2017-08-02 MED ORDER — POTASSIUM CHLORIDE CRYS ER 20 MEQ PO TBCR: EXTENDED_RELEASE_TABLET | ORAL | 2 refills | Status: DC

## 2017-08-02 MED ORDER — SODIUM CHLORIDE 0.9 % IJ SOLN
10.0000 mL | INTRAMUSCULAR | Status: DC | PRN
  Administered 2017-08-02: 10 mL via INTRAVENOUS
  Filled 2017-08-02: qty 10

## 2017-08-02 MED ORDER — SODIUM CHLORIDE 0.9 % IV SOLN
Freq: Once | INTRAVENOUS | Status: AC
  Administered 2017-08-02: 10:00:00 via INTRAVENOUS
  Filled 2017-08-02: qty 250

## 2017-08-02 NOTE — Progress Notes (Signed)
Ok to proceed with tx today per [facility] LPN, per Ned Card, NP with magnesium

## 2017-08-02 NOTE — Telephone Encounter (Signed)
Scheduled appt per 8/1 los - gave patient AVS and calender per los.  

## 2017-08-02 NOTE — Progress Notes (Signed)
[place] OFFICE PROGRESS NOTE   Diagnosis: Colon cancer  INTERVAL HISTORY:   Brenda Becker returns as scheduled.  She completed another cycle of Panitumumab 07/19/2017.  She denies nausea/vomiting.  No mouth sores.  No diarrhea.  Rash is stable.  She has a good appetite.  She denies pain.  No shortness of breath, cough or fever.  She describes her energy level is "okay".  Objective:  Vital signs in last 24 hours:  Blood pressure 128/85, pulse 68, temperature 99.4 F (37.4 C), temperature source Oral, resp. rate 17, height 5' 4.5" (1.638 m), weight 222 lb 6.4 oz (100.9 kg), SpO2 100 %.    HEENT: No thrush or ulcers. Resp: Lungs clear bilaterally. Cardio: Regular rate and rhythm. GI: Abdomen soft and nontender.  No hepatomegaly. Vascular: No leg edema.  Skin: Acne type skin rash on the face and upper chest.  Port-A-Cath without erythema.    Lab Results:  Lab Results  Component Value Date   WBC 5.4 08/02/2017   HGB 11.6 08/02/2017   HCT 35.5 08/02/2017   MCV 84.9 08/02/2017   PLT 205 08/02/2017   NEUTROABS 2.5 08/02/2017    Imaging:  No results found.  Medications: I have reviewed the patient's current medications.  Assessment/Plan: 1. 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  Cycle 2 CAPOX 01/22/2015  Cycle 3 CAPOX 02/12/2015  Cycle 4 CAPOX 03/05/2015  Cycle 5 CAPOX 03/26/2015  Cycle 6 CAPOX 04/16/2015  Cycle 7 CAPOX 05/07/2015 (oxaliplatin held due to progressive neuropathy)  Cycle 8 CAPOX 05/28/2015 (oxaliplatin held secondary to neuropathy)  CTs 11/01/2015-no significant change and an 11 mm upper abdomen mesenteric lymph node with partial  calcification-treated metastasis?, No other evidence of metastatic disease  CTs 01/21/2016-mild interval progression of previously identified calcified nodal tissue in the upper central abdomen. Development of small adjacent lymph nodes in the interval.  EUS guided biopsy of the calcified upper abdominal mass on 02/17/2016 confirmed metastatic adenocarcinoma  Cycle 1 FOLFIRI/Avastin 03/09/2016  Cycle 2 FOLFIRI/Avastin 03/23/2016  Cycle 3 FOLFIRI/Avastin 04/06/2016 (Neulasta added)  Cycle 4 FOLFIRI/Avastin 04/20/2016  Cycle 5 FOLFIRI/Avastin 05/04/2016  Cycle 6 FOLFIRI/Avastin 05/18/2016  CT abdomen/pelvis 05/30/2016-new retroperitoneal lymphadenopathy in the para-aortic region with the largest node measuring 1.5 cm. Partially calcified mesenteric lymph node measures 12 mm as compared to 14 mm previously. Other scattered less than 1 cmmesenteric lymph nodes remain stable.  PET scan 06/13/2016-hypermetabolic posterior mediastinal node, hypermetabolic retroperitoneal lymph nodes, stable transverse mesocolon mass, bilateral hypermetabolic inguinal nodes  EUS biopsy of posterior mediastinal lymph nodes on 07/20/2016-positive for metastatic adenocarcinoma  Clinical trial at [facility], [id number] Panitumumab, nivolumab, ipilimumab.  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 No change in small calcified mesenteric and retroperitoneal abdominal lymph nodes. No evidence of metastatic disease in the chest.  Maintenance panitumumab beginning 03/29/2017, healed 05/24/2017 secondary to elevated liver enzymes-resumed 06/07/2017  [facility] CT[facility] at [facility] 06/04/2017-no evidence of disease  progression, unchanged scattered mesenteric/retroperitoneal lymph nodes  Panitumumab every 2 weeks 2. History of Microcytic anemia-likely iron  deficiency anemia secondary to #1 and ongoing menses 3. Status post cholecystectomy 4. Family history of breast and ovarian cancer. She has seen the genetics counselor. 5. Delayed nausea following cycle 1 CAPOX. Aloxi and Emend added beginning with cycle 2. 6. Transient episodes of dyspnea following cycle 4 and cycle 5 CAPOX, likely neurotoxicity from oxaliplatin 7. Oxaliplatin neuropathy-improved 8. Mild neutropenia secondary to chemotherapy-Neulasta added beginning with cycle 3 FOLFIRI/Avastin 9. Mild mucositis following cycle 4 FOLFIRI/Avastin 10. Mammogram 05/11/2016-possible mass right breast. Right breast ultrasound 05/17/2016-right breast mass at 10:00 most likely represents a cyst, however is somewhat hypoechoic and could also represent a fibroadenoma. Ultrasound-guided aspiration of a 1 cmbenign cyst 05/22/2016. 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 12. Hypomagnesia secondary to Panitumumab. She continues magnesium oxide. 13. Elevated liver enzymes May 2019-likely secondary to resuming atorvastatin, now normal     Disposition: Brenda Becker appears stable.  There is no clinical evidence of disease progression.  Plan to proceed with Panitumumab today as scheduled and continue every 2 weeks.  We reviewed the CBC from today.  Blood counts are stable.  Chemistry panel is pending.  She will return for lab, follow-up and Panitumumab in 2 weeks.  She will contact the office in the interim with any problems.      [name]   08/02/2017  9:16 AM

## 2017-08-02 NOTE — Patient Instructions (Signed)
[facility] Discharge Instructions for Patients Receiving Chemotherapy  Today you received the following chemotherapy agents: Vectibix.  To help prevent nausea and vomiting after your treatment, we encourage you to take your nausea medication as directed.   If you develop nausea and vomiting that is not controlled by your nausea medication, call the clinic.   BELOW ARE SYMPTOMS THAT SHOULD BE REPORTED IMMEDIATELY:  *FEVER GREATER THAN 100.5 F  *CHILLS WITH OR WITHOUT FEVER  NAUSEA AND VOMITING THAT IS NOT CONTROLLED WITH YOUR NAUSEA MEDICATION  *UNUSUAL SHORTNESS OF BREATH  *UNUSUAL BRUISING OR BLEEDING  TENDERNESS IN MOUTH AND THROAT WITH OR WITHOUT PRESENCE OF ULCERS  *URINARY PROBLEMS  *BOWEL PROBLEMS  UNUSUAL RASH Items with * indicate a potential emergency and should be followed up as soon as possible.  Feel free to call the clinic should you have any questions or concerns. The clinic phone number is [phone].  Please show the CHEMO ALERT CARD at check-in to the Emergency Department and triage nurse.   

## 2017-08-12 ENCOUNTER — Other Ambulatory Visit: Payer: Self-pay | Admitting: Oncology

## 2017-08-16 ENCOUNTER — Inpatient Hospital Stay: Payer: PRIVATE HEALTH INSURANCE

## 2017-08-16 ENCOUNTER — Inpatient Hospital Stay: Payer: PRIVATE HEALTH INSURANCE | Admitting: Oncology

## 2017-08-16 VITALS — BP 126/80 | HR 81 | Temp 99.1°F | Resp 18 | Ht 64.25 in | Wt 222.0 lb

## 2017-08-16 DIAGNOSIS — R21 Rash and other nonspecific skin eruption: Secondary | ICD-10-CM

## 2017-08-16 DIAGNOSIS — C184 Malignant neoplasm of transverse colon: Secondary | ICD-10-CM

## 2017-08-16 DIAGNOSIS — C189 Malignant neoplasm of colon, unspecified: Secondary | ICD-10-CM

## 2017-08-16 DIAGNOSIS — Z5112 Encounter for antineoplastic immunotherapy: Secondary | ICD-10-CM | POA: Diagnosis not present

## 2017-08-16 DIAGNOSIS — Z95828 Presence of other vascular implants and grafts: Secondary | ICD-10-CM

## 2017-08-16 LAB — CMP (CANCER CENTER ONLY)
ALK PHOS: 98 U/L (ref 38–126)
ALT: 21 U/L (ref 0–44)
ANION GAP: 9 (ref 5–15)
AST: 20 U/L (ref 15–41)
Albumin: 3.5 g/dL (ref 3.5–5.0)
BILIRUBIN TOTAL: 0.5 mg/dL (ref 0.3–1.2)
BUN: 12 mg/dL (ref 6–20)
CALCIUM: 8.6 mg/dL — AB (ref 8.9–10.3)
CO2: 24 mmol/L (ref 22–32)
Chloride: 109 mmol/L (ref 98–111)
Creatinine: 1.11 mg/dL — ABNORMAL HIGH (ref 0.44–1.00)
GFR, Estimated: 58 mL/min — ABNORMAL LOW (ref >60)
Glucose, Bld: 111 mg/dL — ABNORMAL HIGH (ref 70–99)
Potassium: 3.9 mmol/L (ref 3.5–5.1)
Sodium: 142 mmol/L (ref 135–145)
TOTAL PROTEIN: 6.5 g/dL (ref 6.5–8.1)

## 2017-08-16 LAB — CBC WITH DIFFERENTIAL (CANCER CENTER ONLY)
BASOS PCT: 1 %
Basophils Absolute: 0.1 10*3/uL (ref 0.0–0.1)
EOS ABS: 0.7 10*3/uL — AB (ref 0.0–0.5)
Eosinophils Relative: 10 %
HCT: 36.1 % (ref 34.8–46.6)
Hemoglobin: 12 g/dL (ref 11.6–15.9)
Lymphocytes Relative: 38 %
Lymphs Abs: 2.4 10*3/uL (ref 0.9–3.3)
MCH: 28.2 pg (ref 25.1–34.0)
MCHC: 33.2 g/dL (ref 31.5–36.0)
MCV: 84.9 fL (ref 79.5–101.0)
MONO ABS: 0.5 10*3/uL (ref 0.1–0.9)
MONOS PCT: 8 %
NEUTROS ABS: 2.7 10*3/uL (ref 1.5–6.5)
Neutrophils Relative %: 43 %
Platelet Count: 209 10*3/uL (ref 145–400)
RBC: 4.25 MIL/uL (ref 3.70–5.45)
RDW: 14.1 % (ref 11.2–14.5)
WBC: 6.3 10*3/uL (ref 3.9–10.3)

## 2017-08-16 LAB — MAGNESIUM: Magnesium: 1.2 mg/dL — CL (ref 1.7–2.4)

## 2017-08-16 MED ORDER — SODIUM CHLORIDE 0.9 % IV SOLN
Freq: Once | INTRAVENOUS | Status: AC
Start: 2017-08-16 — End: 2017-08-16
  Administered 2017-08-16: 10:00:00 via INTRAVENOUS
  Filled 2017-08-16: qty 250

## 2017-08-16 MED ORDER — SODIUM CHLORIDE 0.9% FLUSH
10.0000 mL | INTRAVENOUS | Status: DC | PRN
  Administered 2017-08-16: 10 mL
  Filled 2017-08-16: qty 10

## 2017-08-16 MED ORDER — SODIUM CHLORIDE 0.9 % IV SOLN
6.0000 mg/kg | Freq: Once | INTRAVENOUS | Status: AC
  Administered 2017-08-16: 600 mg via INTRAVENOUS
  Filled 2017-08-16: qty 10

## 2017-08-16 MED ORDER — SODIUM CHLORIDE 0.9 % IJ SOLN
10.0000 mL | INTRAMUSCULAR | Status: DC | PRN
  Administered 2017-08-16: 10 mL via INTRAVENOUS
  Filled 2017-08-16: qty 10

## 2017-08-16 MED ORDER — HEPARIN SOD (PORK) LOCK FLUSH 100 UNIT/ML IV SOLN
500.0000 [IU] | Freq: Once | INTRAVENOUS | Status: AC | PRN
  Administered 2017-08-16: 500 [IU]
  Filled 2017-08-16: qty 5

## 2017-08-16 NOTE — Patient Instructions (Signed)
[facility] Discharge Instructions for Patients Receiving Chemotherapy  Today you received the following chemotherapy agents: Vectibix.  To help prevent nausea and vomiting after your treatment, we encourage you to take your nausea medication as directed.   If you develop nausea and vomiting that is not controlled by your nausea medication, call the clinic.   BELOW ARE SYMPTOMS THAT SHOULD BE REPORTED IMMEDIATELY:  *FEVER GREATER THAN 100.5 F  *CHILLS WITH OR WITHOUT FEVER  NAUSEA AND VOMITING THAT IS NOT CONTROLLED WITH YOUR NAUSEA MEDICATION  *UNUSUAL SHORTNESS OF BREATH  *UNUSUAL BRUISING OR BLEEDING  TENDERNESS IN MOUTH AND THROAT WITH OR WITHOUT PRESENCE OF ULCERS  *URINARY PROBLEMS  *BOWEL PROBLEMS  UNUSUAL RASH Items with * indicate a potential emergency and should be followed up as soon as possible.  Feel free to call the clinic should you have any questions or concerns. The clinic phone number is [phone].  Please show the CHEMO ALERT CARD at check-in to the Emergency Department and triage nurse.   

## 2017-08-16 NOTE — Progress Notes (Signed)
Okay to treat with Mag 1.2 per Dr. [name].

## 2017-08-16 NOTE — Progress Notes (Signed)
[place] OFFICE PROGRESS NOTE   Diagnosis: Colon cancer  INTERVAL HISTORY:   Brenda Becker returns as scheduled.  She completed another treatment with Panitumumab on 08/02/2017.  She feels well.  No new complaint.  Stable skin rash.  No mouth sores or diarrhea.  Objective:  Vital signs in last 24 hours:  Blood pressure 126/80, pulse 81, temperature 99.1 F (37.3 C), temperature source Oral, resp. rate 18, height 5' 4.25" (1.632 m), weight 222 lb (100.7 kg), SpO2 100 %.    HEENT: No thrush or ulcers Resp: Lungs clear bilaterally Cardio: Regular rate and rhythm GI: No hepatomegaly, nontender Vascular: No leg edema  Skin: Mild acne type rash over the trunk and face, no breakdown at the fingers  Portacath/PICC-without erythema  Lab Results:  Lab Results  Component Value Date   WBC 6.3 08/16/2017   HGB 12.0 08/16/2017   HCT 36.1 08/16/2017   MCV 84.9 08/16/2017   PLT 209 08/16/2017   NEUTROABS 2.7 08/16/2017    CMP  Lab Results  Component Value Date   NA 142 08/16/2017   K 3.9 08/16/2017   CL 109 08/16/2017   CO2 24 08/16/2017   GLUCOSE 111 (H) 08/16/2017   BUN 12 08/16/2017   CREATININE 1.11 (H) 08/16/2017   CALCIUM 8.6 (L) 08/16/2017   PROT 6.5 08/16/2017   ALBUMIN 3.5 08/16/2017   AST 20 08/16/2017   ALT 21 08/16/2017   ALKPHOS 98 08/16/2017   BILITOT 0.5 08/16/2017   GFRNONAA 58 (L) 08/16/2017   GFRAA >60 08/16/2017    Lab Results  Component Value Date   CEA1 1.86 06/01/2016   Medications: I have reviewed the patient's current medications.   Assessment/Plan: 1. 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  Cycle 2 CAPOX 01/22/2015  Cycle 3  CAPOX 02/12/2015  Cycle 4 CAPOX 03/05/2015  Cycle 5 CAPOX 03/26/2015  Cycle 6 CAPOX 04/16/2015  Cycle 7 CAPOX 05/07/2015 (oxaliplatin held due to progressive neuropathy)  Cycle 8 CAPOX 05/28/2015 (oxaliplatin held secondary to neuropathy)  CTs 11/01/2015-no significant change and an 11 mm upper abdomen mesenteric lymph node with partial calcification-treated metastasis?, No other evidence of metastatic disease  CTs 01/21/2016-mild interval progression of previously identified calcified nodal tissue in the upper central abdomen. Development of small adjacent lymph nodes in the interval.  EUS guided biopsy of the calcified upper abdominal mass on 02/17/2016 confirmed metastatic adenocarcinoma  Cycle 1 FOLFIRI/Avastin 03/09/2016  Cycle 2 FOLFIRI/Avastin 03/23/2016  Cycle 3 FOLFIRI/Avastin 04/06/2016 (Neulasta added)  Cycle 4 FOLFIRI/Avastin 04/20/2016  Cycle 5 FOLFIRI/Avastin 05/04/2016  Cycle 6 FOLFIRI/Avastin 05/18/2016  CT abdomen/pelvis 05/30/2016-new retroperitoneal lymphadenopathy in the para-aortic region with the largest node measuring 1.5 cm. Partially calcified mesenteric lymph node measures 12 mm as compared to 14 mm previously. Other scattered less than 1 cmmesenteric lymph nodes remain stable.  PET scan 06/13/2016-hypermetabolic posterior mediastinal node, hypermetabolic retroperitoneal lymph nodes, stable transverse mesocolon mass, bilateral hypermetabolic inguinal nodes  EUS biopsy of posterior mediastinal lymph nodes on 07/20/2016-positive for metastatic adenocarcinoma  Clinical trial at [facility], [id number] Panitumumab, nivolumab, ipilimumab.  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 No change in small calcified mesenteric and retroperitoneal abdominal lymph nodes. No evidence of metastatic disease in the chest.  Maintenance panitumumab beginning 03/29/2017, healed 05/24/2017 secondary to elevated liver enzymes-resumed 06/07/2017  [facility]aging CT[facility] at [facility] 06/04/2017-no evidence of disease progression, unchanged scattered mesenteric/retroperitoneal lymph nodes  Panitumumab every 2 weeks 2. History of Microcytic anemia-likely iron deficiency anemia secondary to #1 and ongoing menses 3. Status post cholecystectomy 4. Family history of breast and ovarian cancer. She has seen the genetics counselor. 5. Delayed nausea following cycle 1 CAPOX. Aloxi and Emend added beginning with cycle 2. 6. Transient episodes of dyspnea following cycle 4 and cycle 5 CAPOX, likely neurotoxicity from oxaliplatin 7. Oxaliplatin neuropathy-improved 8. Mild neutropenia secondary to chemotherapy-Neulasta added beginning with cycle 3 FOLFIRI/Avastin 9. Mild mucositis following cycle 4 FOLFIRI/Avastin 10. Mammogram 05/11/2016-possible mass right breast. Right breast ultrasound 05/17/2016-right breast mass at 10:00 most likely represents a cyst, however is somewhat hypoechoic and could also represent a fibroadenoma. Ultrasound-guided aspiration of a 1 cmbenign cyst 05/22/2016. 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 12. Hypomagnesia secondary to Panitumumab. She continues magnesium oxide. 13. Elevated liver enzymes May 2019-likely secondary to resuming atorvastatin, now normal  Disposition: Brenda Becker appears stable.  She will continue every 2-week panitumumab.  She is scheduled for restaging CTs at [facility] on 08/27/2017.  Will return for an office visit here 08/30/2017.  15 minutes were spent with the patient today.  The majority of the time was used for counseling and coordination of care.  Betsy Coder, MD  08/16/2017  10:14  AM

## 2017-08-23 ENCOUNTER — Other Ambulatory Visit: Payer: Self-pay

## 2017-08-23 DIAGNOSIS — C189 Malignant neoplasm of colon, unspecified: Secondary | ICD-10-CM

## 2017-08-23 MED ORDER — MAGNESIUM 400 MG PO CAPS: 400.0000 mg | ORAL_CAPSULE | Freq: Four times a day (QID) | ORAL | 1 refills | Status: DC

## 2017-08-26 ENCOUNTER — Other Ambulatory Visit: Payer: Self-pay | Admitting: Oncology

## 2017-08-28 ENCOUNTER — Other Ambulatory Visit: Payer: Self-pay | Admitting: Oncology

## 2017-08-28 DIAGNOSIS — C189 Malignant neoplasm of colon, unspecified: Secondary | ICD-10-CM

## 2017-08-30 ENCOUNTER — Encounter: Payer: Self-pay | Admitting: Nurse Practitioner

## 2017-08-30 ENCOUNTER — Inpatient Hospital Stay (HOSPITAL_BASED_OUTPATIENT_CLINIC_OR_DEPARTMENT_OTHER): Payer: PRIVATE HEALTH INSURANCE | Admitting: Nurse Practitioner

## 2017-08-30 ENCOUNTER — Inpatient Hospital Stay: Payer: PRIVATE HEALTH INSURANCE

## 2017-08-30 ENCOUNTER — Telehealth: Payer: Self-pay | Admitting: Nurse Practitioner

## 2017-08-30 VITALS — BP 121/80 | HR 72 | Temp 98.6°F | Resp 18 | Ht 64.25 in | Wt 223.6 lb

## 2017-08-30 DIAGNOSIS — C184 Malignant neoplasm of transverse colon: Secondary | ICD-10-CM

## 2017-08-30 DIAGNOSIS — Z95828 Presence of other vascular implants and grafts: Secondary | ICD-10-CM

## 2017-08-30 DIAGNOSIS — C189 Malignant neoplasm of colon, unspecified: Secondary | ICD-10-CM

## 2017-08-30 DIAGNOSIS — Z5112 Encounter for antineoplastic immunotherapy: Secondary | ICD-10-CM | POA: Diagnosis not present

## 2017-08-30 DIAGNOSIS — R21 Rash and other nonspecific skin eruption: Secondary | ICD-10-CM | POA: Diagnosis not present

## 2017-08-30 LAB — CMP (CANCER CENTER ONLY)
ALT: 22 U/L (ref 0–44)
AST: 20 U/L (ref 15–41)
Albumin: 3.5 g/dL (ref 3.5–5.0)
Alkaline Phosphatase: 98 U/L (ref 38–126)
Anion gap: 9 (ref 5–15)
BUN: 9 mg/dL (ref 6–20)
CO2: 22 mmol/L (ref 22–32)
Calcium: 8.7 mg/dL — ABNORMAL LOW (ref 8.9–10.3)
Chloride: 110 mmol/L (ref 98–111)
Creatinine: 1.03 mg/dL — ABNORMAL HIGH (ref 0.44–1.00)
GFR, Est AFR Am: >60 mL/min (ref >60)
GFR, Estimated: >60 mL/min (ref >60)
Glucose, Bld: 120 mg/dL — ABNORMAL HIGH (ref 70–99)
Potassium: 3.9 mmol/L (ref 3.5–5.1)
Sodium: 141 mmol/L (ref 135–145)
Total Bilirubin: 0.4 mg/dL (ref 0.3–1.2)
Total Protein: 6.4 g/dL — ABNORMAL LOW (ref 6.5–8.1)

## 2017-08-30 LAB — MAGNESIUM: MAGNESIUM: 1.1 mg/dL — AB (ref 1.7–2.4)

## 2017-08-30 MED ORDER — SODIUM CHLORIDE 0.9 % IJ SOLN
10.0000 mL | INTRAMUSCULAR | Status: DC | PRN
  Administered 2017-08-30: 10 mL via INTRAVENOUS
  Filled 2017-08-30: qty 10

## 2017-08-30 MED ORDER — SODIUM CHLORIDE 0.9 % IV SOLN
Freq: Once | INTRAVENOUS | Status: AC
  Administered 2017-08-30: 11:00:00 via INTRAVENOUS
  Filled 2017-08-30: qty 250

## 2017-08-30 MED ORDER — SODIUM CHLORIDE 0.9% FLUSH
10.0000 mL | INTRAVENOUS | Status: DC | PRN
  Administered 2017-08-30: 10 mL
  Filled 2017-08-30: qty 10

## 2017-08-30 MED ORDER — SODIUM CHLORIDE 0.9 % IV SOLN
6.0000 mg/kg | Freq: Once | INTRAVENOUS | Status: AC
  Administered 2017-08-30: 600 mg via INTRAVENOUS
  Filled 2017-08-30: qty 20

## 2017-08-30 MED ORDER — HEPARIN SOD (PORK) LOCK FLUSH 100 UNIT/ML IV SOLN
500.0000 [IU] | Freq: Once | INTRAVENOUS | Status: AC | PRN
  Administered 2017-08-30: 500 [IU]
  Filled 2017-08-30: qty 5

## 2017-08-30 NOTE — Telephone Encounter (Signed)
Gave pt calendar and avs.  °

## 2017-08-30 NOTE — Patient Instructions (Signed)
[facility] Discharge Instructions for Patients Receiving Chemotherapy  Today you received the following chemotherapy agents: Vectibix.  To help prevent nausea and vomiting after your treatment, we encourage you to take your nausea medication as directed.   If you develop nausea and vomiting that is not controlled by your nausea medication, call the clinic.   BELOW ARE SYMPTOMS THAT SHOULD BE REPORTED IMMEDIATELY:  *FEVER GREATER THAN 100.5 F  *CHILLS WITH OR WITHOUT FEVER  NAUSEA AND VOMITING THAT IS NOT CONTROLLED WITH YOUR NAUSEA MEDICATION  *UNUSUAL SHORTNESS OF BREATH  *UNUSUAL BRUISING OR BLEEDING  TENDERNESS IN MOUTH AND THROAT WITH OR WITHOUT PRESENCE OF ULCERS  *URINARY PROBLEMS  *BOWEL PROBLEMS  UNUSUAL RASH Items with * indicate a potential emergency and should be followed up as soon as possible.  Feel free to call the clinic should you have any questions or concerns. The clinic phone number is [phone].  Please show the CHEMO ALERT CARD at check-in to the Emergency Department and triage nurse.   

## 2017-08-30 NOTE — Progress Notes (Addendum)
[place] OFFICE PROGRESS NOTE   Diagnosis: Colon cancer  INTERVAL HISTORY:   Brenda Becker returns as scheduled.  She completed another cycle of Panitumumab on 08/16/2017.  She overall feels well.  The rash varies.  She continues doxycycline.  No nausea or vomiting.  No mouth sores.  No diarrhea.  She has a good appetite.  She describes her energy level is "okay".  No pain.  No shortness of breath, cough or fever.  Objective:  Vital signs in last 24 hours:  Blood pressure 121/80, pulse 72, temperature 98.6 F (37 C), temperature source Oral, resp. rate 18, height 5' 4.25" (1.632 m), weight 223 lb 9.6 oz (101.4 kg), SpO2 100 %.    HEENT: No thrush or ulcers. Resp: Lungs clear bilaterally. Cardio: Regular rate and rhythm. GI: Abdomen soft and nontender.  No hepatomegaly. Vascular: No leg edema. Neuro: Alert and oriented. Skin: Acne type rash scattered over the face and trunk. Port-A-Cath without erythema.   Lab Results:  Lab Results  Component Value Date   WBC 6.3 08/16/2017   HGB 12.0 08/16/2017   HCT 36.1 08/16/2017   MCV 84.9 08/16/2017   PLT 209 08/16/2017   NEUTROABS 2.7 08/16/2017    Imaging:  No results found.  Medications: I have reviewed the patient's current medications.  Assessment/Plan: 1. 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  Cycle 2 CAPOX 01/22/2015  Cycle 3 CAPOX 02/12/2015  Cycle 4 CAPOX 03/05/2015  Cycle 5 CAPOX 03/26/2015  Cycle 6 CAPOX 04/16/2015  Cycle 7 CAPOX 05/07/2015 (oxaliplatin held due to progressive neuropathy)  Cycle 8 CAPOX 05/28/2015 (oxaliplatin held secondary to neuropathy)  CTs 11/01/2015-no significant  change and an 11 mm upper abdomen mesenteric lymph node with partial calcification-treated metastasis?, No other evidence of metastatic disease  CTs 01/21/2016-mild interval progression of previously identified calcified nodal tissue in the upper central abdomen. Development of small adjacent lymph nodes in the interval.  EUS guided biopsy of the calcified upper abdominal mass on 02/17/2016 confirmed metastatic adenocarcinoma  Cycle 1 FOLFIRI/Avastin 03/09/2016  Cycle 2 FOLFIRI/Avastin 03/23/2016  Cycle 3 FOLFIRI/Avastin 04/06/2016 (Neulasta added)  Cycle 4 FOLFIRI/Avastin 04/20/2016  Cycle 5 FOLFIRI/Avastin 05/04/2016  Cycle 6 FOLFIRI/Avastin 05/18/2016  CT abdomen/pelvis 05/30/2016-new retroperitoneal lymphadenopathy in the para-aortic region with the largest node measuring 1.5 cm. Partially calcified mesenteric lymph node measures 12 mm as compared to 14 mm previously. Other scattered less than 1 cmmesenteric lymph nodes remain stable.  PET scan 06/13/2016-hypermetabolic posterior mediastinal node, hypermetabolic retroperitoneal lymph nodes, stable transverse mesocolon mass, bilateral hypermetabolic inguinal nodes  EUS biopsy of posterior mediastinal lymph nodes on 07/20/2016-positive for metastatic adenocarcinoma  Clinical trial at [facility], [id number] Panitumumab, nivolumab, ipilimumab.  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 No change in small calcified mesenteric and retroperitoneal abdominal lymph nodes. No evidence of metastatic disease in the chest.  Maintenance panitumumab beginning 03/29/2017, healed 05/24/2017 secondary to elevated liver  enzymes-resumed 06/07/2017  Restaging CTs at [facility] 06/04/2017-no evidence of disease progression, unchanged scattered mesenteric/retroperitoneal lymph nodes  Panitumumab every 2 weeks  Restaging CTs 08/27/2017- no evidence of metastatic disease in the chest; unchanged size and appearance of scattered calcified and noncalcified mesenteric and retroperitoneal lymph nodes.  Near complete resolution of previously described haziness within the central mesentery.  No new evidence of recurrent or metastatic disease within the abdomen or pelvis. 2. History of Microcytic anemia-likely iron deficiency anemia secondary to #1 and ongoing menses 3. Status post cholecystectomy 4. Family history of breast and ovarian cancer. She has seen the genetics counselor. 5. Delayed nausea following cycle 1 CAPOX. Aloxi and Emend added beginning with cycle 2. 6. Transient episodes of dyspnea following cycle 4 and cycle 5 CAPOX, likely neurotoxicity from oxaliplatin 7. Oxaliplatin neuropathy-improved 8. Mild neutropenia secondary to chemotherapy-Neulasta added beginning with cycle 3 FOLFIRI/Avastin 9. Mild mucositis following cycle 4 FOLFIRI/Avastin 10. Mammogram 05/11/2016-possible mass right breast. Right breast ultrasound 05/17/2016-right breast mass at 10:00 most likely represents a cyst, however is somewhat hypoechoic and could also represent a fibroadenoma. Ultrasound-guided aspiration of a 1 cmbenign cyst 05/22/2016. 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 12. Hypomagnesia secondary to Panitumumab. She continues magnesium oxide. 13. Elevated liver enzymes May 2019-likely secondary to resuming atorvastatin, now normal   Disposition: Brenda Becker appears well.  The recent restaging CT scans show no evidence of disease progression.  Plan to continue Panitumumab every 2 weeks.  We will see her in follow-up in 4 weeks.  She will contact the office in the interim with any problems.  Patient  seen with Dr. [name].    [name]   08/30/2017  9:48 AM This was a shared visit with Ned Card.  The restaging CT shows stable disease.  She will continue panitumumab.  Brenda Manson, MD

## 2017-08-30 NOTE — Progress Notes (Signed)
Okay to treat today with Mag. 1.1, per Dr [name].

## 2017-09-09 ENCOUNTER — Other Ambulatory Visit: Payer: Self-pay | Admitting: Oncology

## 2017-09-13 ENCOUNTER — Other Ambulatory Visit: Payer: PRIVATE HEALTH INSURANCE

## 2017-09-13 ENCOUNTER — Inpatient Hospital Stay: Payer: PRIVATE HEALTH INSURANCE | Admitting: *Deleted

## 2017-09-13 ENCOUNTER — Inpatient Hospital Stay: Payer: PRIVATE HEALTH INSURANCE

## 2017-09-13 ENCOUNTER — Inpatient Hospital Stay: Payer: PRIVATE HEALTH INSURANCE | Attending: Oncology

## 2017-09-13 ENCOUNTER — Ambulatory Visit: Payer: PRIVATE HEALTH INSURANCE | Admitting: Oncology

## 2017-09-13 VITALS — BP 124/78 | HR 65 | Temp 98.6°F | Resp 18 | Ht 64.2 in | Wt 221.2 lb

## 2017-09-13 DIAGNOSIS — Z95828 Presence of other vascular implants and grafts: Secondary | ICD-10-CM

## 2017-09-13 DIAGNOSIS — C189 Malignant neoplasm of colon, unspecified: Secondary | ICD-10-CM

## 2017-09-13 DIAGNOSIS — Z5112 Encounter for antineoplastic immunotherapy: Secondary | ICD-10-CM | POA: Diagnosis not present

## 2017-09-13 DIAGNOSIS — C184 Malignant neoplasm of transverse colon: Secondary | ICD-10-CM | POA: Diagnosis present

## 2017-09-13 LAB — CBC WITH DIFFERENTIAL (CANCER CENTER ONLY)
BASOS ABS: 0 10*3/uL (ref 0.0–0.1)
BASOS PCT: 1 %
EOS PCT: 8 %
Eosinophils Absolute: 0.5 10*3/uL (ref 0.0–0.5)
HEMATOCRIT: 34.5 % — AB (ref 34.8–46.6)
Hemoglobin: 11.7 g/dL (ref 11.6–15.9)
Lymphocytes Relative: 39 %
Lymphs Abs: 2.3 10*3/uL (ref 0.9–3.3)
MCH: 28.8 pg (ref 25.1–34.0)
MCHC: 33.9 g/dL (ref 31.5–36.0)
MCV: 85 fL (ref 79.5–101.0)
MONO ABS: 0.4 10*3/uL (ref 0.1–0.9)
MONOS PCT: 8 %
NEUTROS ABS: 2.7 10*3/uL (ref 1.5–6.5)
Neutrophils Relative %: 44 %
PLATELETS: 196 10*3/uL (ref 145–400)
RBC: 4.06 MIL/uL (ref 3.70–5.45)
RDW: 14.1 % (ref 11.2–14.5)
WBC Count: 5.9 10*3/uL (ref 3.9–10.3)

## 2017-09-13 LAB — CMP (CANCER CENTER ONLY)
ALBUMIN: 3.4 g/dL — AB (ref 3.5–5.0)
ALK PHOS: 103 U/L (ref 38–126)
ALT: 19 U/L (ref 0–44)
AST: 18 U/L (ref 15–41)
Anion gap: 7 (ref 5–15)
BILIRUBIN TOTAL: 0.4 mg/dL (ref 0.3–1.2)
BUN: 9 mg/dL (ref 6–20)
CALCIUM: 8.6 mg/dL — AB (ref 8.9–10.3)
CO2: 24 mmol/L (ref 22–32)
CREATININE: 1.01 mg/dL — AB (ref 0.44–1.00)
Chloride: 111 mmol/L (ref 98–111)
GFR, Est AFR Am: >60 mL/min (ref >60)
GFR, Estimated: >60 mL/min (ref >60)
GLUCOSE: 102 mg/dL — AB (ref 70–99)
Potassium: 3.9 mmol/L (ref 3.5–5.1)
Sodium: 142 mmol/L (ref 135–145)
TOTAL PROTEIN: 6.5 g/dL (ref 6.5–8.1)

## 2017-09-13 LAB — MAGNESIUM: Magnesium: 1.3 mg/dL — CL (ref 1.7–2.4)

## 2017-09-13 MED ORDER — SODIUM CHLORIDE 0.9 % IV SOLN
Freq: Once | INTRAVENOUS | Status: AC
  Administered 2017-09-13: 12:00:00 via INTRAVENOUS
  Filled 2017-09-13: qty 250

## 2017-09-13 MED ORDER — SODIUM CHLORIDE 0.9 % IV SOLN
6.0000 mg/kg | Freq: Once | INTRAVENOUS | Status: AC
  Administered 2017-09-13: 600 mg via INTRAVENOUS
  Filled 2017-09-13: qty 20

## 2017-09-13 MED ORDER — HEPARIN SOD (PORK) LOCK FLUSH 100 UNIT/ML IV SOLN
500.0000 [IU] | Freq: Once | INTRAVENOUS | Status: AC | PRN
  Administered 2017-09-13: 500 [IU]
  Filled 2017-09-13: qty 5

## 2017-09-13 MED ORDER — SODIUM CHLORIDE 0.9% FLUSH
10.0000 mL | INTRAVENOUS | Status: DC | PRN
  Administered 2017-09-13: 10 mL
  Filled 2017-09-13: qty 10

## 2017-09-13 NOTE — Patient Instructions (Signed)
[facility] Discharge Instructions for Patients Receiving Chemotherapy  Today you received the following chemotherapy agents: Vectibix.  To help prevent nausea and vomiting after your treatment, we encourage you to take your nausea medication as directed.   If you develop nausea and vomiting that is not controlled by your nausea medication, call the clinic.   BELOW ARE SYMPTOMS THAT SHOULD BE REPORTED IMMEDIATELY:  *FEVER GREATER THAN 100.5 F  *CHILLS WITH OR WITHOUT FEVER  NAUSEA AND VOMITING THAT IS NOT CONTROLLED WITH YOUR NAUSEA MEDICATION  *UNUSUAL SHORTNESS OF BREATH  *UNUSUAL BRUISING OR BLEEDING  TENDERNESS IN MOUTH AND THROAT WITH OR WITHOUT PRESENCE OF ULCERS  *URINARY PROBLEMS  *BOWEL PROBLEMS  UNUSUAL RASH Items with * indicate a potential emergency and should be followed up as soon as possible.  Feel free to call the clinic should you have any questions or concerns. The clinic phone number is [phone].  Please show the CHEMO ALERT CARD at check-in to the Emergency Department and triage nurse.   

## 2017-09-13 NOTE — Progress Notes (Signed)
Okj to tx with abnormal labs per MD [name]

## 2017-09-13 NOTE — Patient Instructions (Signed)
Implanted Port Home Guide An implanted port is a type of central line that is placed under the skin. Central lines are used to provide IV access when treatment or nutrition needs to be given through a person's veins. Implanted ports are used for long-term IV access. An implanted port may be placed because:  You need IV medicine that would be irritating to the small veins in your hands or arms.  You need long-term IV medicines, such as antibiotics.  You need IV nutrition for a long period.  You need frequent blood draws for lab tests.  You need dialysis.  Implanted ports are usually placed in the chest area, but they can also be placed in the upper arm, the abdomen, or the leg. An implanted port has two main parts:  Reservoir. The reservoir is round and will appear as a small, raised area under your skin. The reservoir is the part where a needle is inserted to give medicines or draw blood.  Catheter. The catheter is a thin, flexible tube that extends from the reservoir. The catheter is placed into a large vein. Medicine that is inserted into the reservoir goes into the catheter and then into the vein.  How will I care for my incision site? Do not get the incision site wet. Bathe or shower as directed by your health care provider. How is my port accessed? Special steps must be taken to access the port:  Before the port is accessed, a numbing cream can be placed on the skin. This helps numb the skin over the port site.  Your health care provider uses a sterile technique to access the port. ? Your health care provider must put on a mask and sterile gloves. ? The skin over your port is cleaned carefully with an antiseptic and allowed to dry. ? The port is gently pinched between sterile gloves, and a needle is inserted into the port.  Only "non-coring" port needles should be used to access the port. Once the port is accessed, a blood return should be checked. This helps ensure that the port  is in the vein and is not clogged.  If your port needs to remain accessed for a constant infusion, a clear (transparent) bandage will be placed over the needle site. The bandage and needle will need to be changed every week, or as directed by your health care provider.  Keep the bandage covering the needle clean and dry. Do not get it wet. Follow your health care provider's instructions on how to take a shower or bath while the port is accessed.  If your port does not need to stay accessed, no bandage is needed over the port.  What is flushing? Flushing helps keep the port from getting clogged. Follow your health care provider's instructions on how and when to flush the port. Ports are usually flushed with saline solution or a medicine called heparin. The need for flushing will depend on how the port is used.  If the port is used for intermittent medicines or blood draws, the port will need to be flushed: ? After medicines have been given. ? After blood has been drawn. ? As part of routine maintenance.  If a constant infusion is running, the port may not need to be flushed.  How long will my port stay implanted? The port can stay in for as long as your health care provider thinks it is needed. When it is time for the port to come out, surgery will be   done to remove it. The procedure is similar to the one performed when the port was put in. When should I seek immediate medical care? When you have an implanted port, you should seek immediate medical care if:  You notice a bad smell coming from the incision site.  You have swelling, redness, or drainage at the incision site.  You have more swelling or pain at the port site or the surrounding area.  You have a fever that is not controlled with medicine.  This information is not intended to replace advice given to you by your health care provider. Make sure you discuss any questions you have with your health care provider. Document  Released: 12/19/2004 Document Revised: 05/27/2015 Document Reviewed: 08/26/2012 Elsevier Interactive Patient Education  2017 Elsevier Inc.  

## 2017-09-27 ENCOUNTER — Inpatient Hospital Stay: Payer: PRIVATE HEALTH INSURANCE

## 2017-09-27 ENCOUNTER — Encounter: Payer: Self-pay | Admitting: Nurse Practitioner

## 2017-09-27 ENCOUNTER — Inpatient Hospital Stay (HOSPITAL_BASED_OUTPATIENT_CLINIC_OR_DEPARTMENT_OTHER): Payer: PRIVATE HEALTH INSURANCE | Admitting: Nurse Practitioner

## 2017-09-27 VITALS — BP 129/81 | HR 82 | Temp 98.4°F | Resp 18 | Ht 64.2 in | Wt 219.3 lb

## 2017-09-27 DIAGNOSIS — Z95828 Presence of other vascular implants and grafts: Secondary | ICD-10-CM

## 2017-09-27 DIAGNOSIS — Z5112 Encounter for antineoplastic immunotherapy: Secondary | ICD-10-CM | POA: Diagnosis not present

## 2017-09-27 DIAGNOSIS — L03011 Cellulitis of right finger: Secondary | ICD-10-CM | POA: Diagnosis not present

## 2017-09-27 DIAGNOSIS — C184 Malignant neoplasm of transverse colon: Secondary | ICD-10-CM

## 2017-09-27 DIAGNOSIS — C189 Malignant neoplasm of colon, unspecified: Secondary | ICD-10-CM

## 2017-09-27 LAB — CBC WITH DIFFERENTIAL (CANCER CENTER ONLY)
BASOS PCT: 1 %
Basophils Absolute: 0.1 10*3/uL (ref 0.0–0.1)
EOS ABS: 0.5 10*3/uL (ref 0.0–0.5)
EOS PCT: 8 %
HCT: 36.5 % (ref 34.8–46.6)
Hemoglobin: 12.3 g/dL (ref 11.6–15.9)
LYMPHS ABS: 2.1 10*3/uL (ref 0.9–3.3)
Lymphocytes Relative: 37 %
MCH: 28.7 pg (ref 25.1–34.0)
MCHC: 33.8 g/dL (ref 31.5–36.0)
MCV: 84.9 fL (ref 79.5–101.0)
MONOS PCT: 8 %
Monocytes Absolute: 0.5 10*3/uL (ref 0.1–0.9)
NEUTROS PCT: 46 %
Neutro Abs: 2.6 10*3/uL (ref 1.5–6.5)
PLATELETS: 207 10*3/uL (ref 145–400)
RBC: 4.3 MIL/uL (ref 3.70–5.45)
RDW: 14.1 % (ref 11.2–14.5)
WBC: 5.7 10*3/uL (ref 3.9–10.3)

## 2017-09-27 LAB — CMP (CANCER CENTER ONLY)
ALK PHOS: 106 U/L (ref 38–126)
ALT: 31 U/L (ref 0–44)
AST: 27 U/L (ref 15–41)
Albumin: 3.5 g/dL (ref 3.5–5.0)
Anion gap: 8 (ref 5–15)
BUN: 10 mg/dL (ref 6–20)
CALCIUM: 8.9 mg/dL (ref 8.9–10.3)
CHLORIDE: 110 mmol/L (ref 98–111)
CO2: 25 mmol/L (ref 22–32)
CREATININE: 1.01 mg/dL — AB (ref 0.44–1.00)
Glucose, Bld: 103 mg/dL — ABNORMAL HIGH (ref 70–99)
Potassium: 4 mmol/L (ref 3.5–5.1)
Sodium: 143 mmol/L (ref 135–145)
Total Bilirubin: 0.4 mg/dL (ref 0.3–1.2)
Total Protein: 6.7 g/dL (ref 6.5–8.1)

## 2017-09-27 LAB — MAGNESIUM: Magnesium: 1.3 mg/dL — CL (ref 1.7–2.4)

## 2017-09-27 MED ORDER — SODIUM CHLORIDE 0.9 % IJ SOLN
10.0000 mL | INTRAMUSCULAR | Status: DC | PRN
  Administered 2017-09-27: 10 mL via INTRAVENOUS
  Filled 2017-09-27: qty 10

## 2017-09-27 MED ORDER — SODIUM CHLORIDE 0.9 % IV SOLN
Freq: Once | INTRAVENOUS | Status: AC
  Administered 2017-09-27: 12:00:00 via INTRAVENOUS
  Filled 2017-09-27: qty 250

## 2017-09-27 MED ORDER — SODIUM CHLORIDE 0.9% FLUSH
10.0000 mL | INTRAVENOUS | Status: DC | PRN
  Administered 2017-09-27: 10 mL
  Filled 2017-09-27: qty 10

## 2017-09-27 MED ORDER — SODIUM CHLORIDE 0.9 % IV SOLN
6.0000 mg/kg | Freq: Once | INTRAVENOUS | Status: AC
  Administered 2017-09-27: 600 mg via INTRAVENOUS
  Filled 2017-09-27: qty 20

## 2017-09-27 MED ORDER — HEPARIN SOD (PORK) LOCK FLUSH 100 UNIT/ML IV SOLN
500.0000 [IU] | Freq: Once | INTRAVENOUS | Status: AC | PRN
  Administered 2017-09-27: 500 [IU]
  Filled 2017-09-27: qty 5

## 2017-09-27 NOTE — Progress Notes (Signed)
Brenda Becker OFFICE PROGRESS NOTE   Diagnosis: Colon cancer  INTERVAL HISTORY:   Brenda Becker returns as scheduled.  She completed another cycle of Panitumumab 08/30/2017.  She denies nausea/vomiting.  No mouth sores.  No diarrhea.  Skin rash is some better.  She notes erythema/tenderness around some nailbeds.  Objective:  Vital signs in last 24 hours:  Blood pressure 129/81, pulse 82, temperature 98.4 F (36.9 C), temperature source Oral, resp. rate 18, height 5' 4.2" (1.631 m), weight 219 lb 4.8 oz (99.5 kg), SpO2 100 %.    HEENT: No thrush or ulcers. Resp: Lungs clear bilaterally. Cardio: Regular rate and rhythm. GI: Abdomen soft and nontender.  No hepatomegaly. Vascular: No leg edema. Skin: Mild acne type skin rash scattered over the face and trunk.  Right middle finger with erythema/tenderness medial edge. Port-A-Cath without erythema.   Lab Results:  Lab Results  Component Value Date   WBC 5.7 09/27/2017   HGB 12.3 09/27/2017   HCT 36.5 09/27/2017   MCV 84.9 09/27/2017   PLT 207 09/27/2017   NEUTROABS 2.6 09/27/2017    Imaging:  No results found.  Medications: I have reviewed the patient's current medications.  Assessment/Plan: 1. 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  Cycle 2 CAPOX 01/22/2015  Cycle 3 CAPOX 02/12/2015  Cycle 4 CAPOX 03/05/2015  Cycle 5 CAPOX 03/26/2015  Cycle 6 CAPOX 04/16/2015  Cycle 7 CAPOX 05/07/2015 (oxaliplatin held due to progressive neuropathy)  Cycle 8 CAPOX 05/28/2015 (oxaliplatin held secondary to neuropathy)  CTs 11/01/2015-no significant change and an 11 mm upper abdomen mesenteric lymph node with partial  calcification-treated metastasis?, No other evidence of metastatic disease  CTs 01/21/2016-mild interval progression of previously identified calcified nodal tissue in the upper central abdomen. Development of small adjacent lymph nodes in the interval.  EUS guided biopsy of the calcified upper abdominal mass on 02/17/2016 confirmed metastatic adenocarcinoma  Cycle 1 FOLFIRI/Avastin 03/09/2016  Cycle 2 FOLFIRI/Avastin 03/23/2016  Cycle 3 FOLFIRI/Avastin 04/06/2016 (Neulasta added)  Cycle 4 FOLFIRI/Avastin 04/20/2016  Cycle 5 FOLFIRI/Avastin 05/04/2016  Cycle 6 FOLFIRI/Avastin 05/18/2016  CT abdomen/pelvis 05/30/2016-new retroperitoneal lymphadenopathy in the para-aortic region with the largest node measuring 1.5 cm. Partially calcified mesenteric lymph node measures 12 mm as compared to 14 mm previously. Other scattered less than 1 cmmesenteric lymph nodes remain stable.  PET scan 06/13/2016-hypermetabolic posterior mediastinal node, hypermetabolic retroperitoneal lymph nodes, stable transverse mesocolon mass, bilateral hypermetabolic inguinal nodes  EUS biopsy of posterior mediastinal lymph nodes on 07/20/2016-positive for metastatic adenocarcinoma  Clinical trial at [facility], [id number] Panitumumab, nivolumab, ipilimumab.  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 No change in small calcified mesenteric and retroperitoneal abdominal lymph nodes. No evidence of metastatic disease in the chest.  Maintenance panitumumab beginning 03/29/2017, healed 05/24/2017 secondary to elevated liver enzymes-resumed 06/07/2017  Restaging CTs at [facility] 06/04/2017-no evidence of disease  progression, unchanged scattered mesenteric/retroperitoneal lymph nodes  Panitumumab every 2 weeks  Restaging CTs 08/27/2017- no evidence of metastatic disease in the chest; unchanged size and appearance of  scattered calcified and noncalcified mesenteric and retroperitoneal lymph nodes.  Near complete resolution of previously described haziness within the central mesentery.  No new evidence of recurrent or metastatic disease within the abdomen or pelvis.  Continue Panitumumab every 2 weeks 2. History of Microcytic anemia-likely iron deficiency anemia secondary to #1 and ongoing menses 3. Status post cholecystectomy 4. Family history of breast and ovarian cancer. She has seen the genetics counselor. 5. Delayed nausea following cycle 1 CAPOX. Aloxi and Emend added beginning with cycle 2. 6. Transient episodes of dyspnea following cycle 4 and cycle 5 CAPOX, likely neurotoxicity from oxaliplatin 7. Oxaliplatin neuropathy-improved 8. Mild neutropenia secondary to chemotherapy-Neulasta added beginning with cycle 3 FOLFIRI/Avastin 9. Mild mucositis following cycle 4 FOLFIRI/Avastin 10. Mammogram 05/11/2016-possible mass right breast. Right breast ultrasound 05/17/2016-right breast mass at 10:00 most likely represents a cyst, however is somewhat hypoechoic and could also represent a fibroadenoma. Ultrasound-guided aspiration of a 1 cmbenign cyst 05/22/2016. 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 12. Hypomagnesia secondary to Panitumumab. She continues magnesium oxide. 13. Elevated liver enzymes May 2019-likely secondary to resuming atorvastatin, now normal 14. Paronychia related to Panitumumab.  Disposition: Brenda Becker appears stable.  There is no clinical evidence of disease progression.  Plan to continue every 2-week Panitumumab.    She has evidence of paronychia involving a finger nailbed on the right hand.  She will try warm water soaks and apply an antibiotic  ointment/Band-Aid.  She will contact the office if symptoms worsen.  She will return for lab and Panitumumab in 2 weeks.  We will see her in follow-up in 4 weeks.  She will contact the office in the interim as outlined above or with any other problems.    [name]   09/27/2017  10:51 AM

## 2017-09-27 NOTE — Progress Notes (Signed)
Per Ned Card NP, ok to proceed with treatment with Mg++ 1.3. No new orders.

## 2017-09-27 NOTE — Patient Instructions (Signed)
[facility] Discharge Instructions for Patients Receiving Chemotherapy  Today you received the following vectibix.  To help prevent nausea and vomiting after your treatment, we encourage you to take your nausea medication as directed.   If you develop nausea and vomiting that is not controlled by your nausea medication, call the clinic.   BELOW ARE SYMPTOMS THAT SHOULD BE REPORTED IMMEDIATELY:  *FEVER GREATER THAN 100.5 F  *CHILLS WITH OR WITHOUT FEVER  NAUSEA AND VOMITING THAT IS NOT CONTROLLED WITH YOUR NAUSEA MEDICATION  *UNUSUAL SHORTNESS OF BREATH  *UNUSUAL BRUISING OR BLEEDING  TENDERNESS IN MOUTH AND THROAT WITH OR WITHOUT PRESENCE OF ULCERS  *URINARY PROBLEMS  *BOWEL PROBLEMS  UNUSUAL RASH Items with * indicate a potential emergency and should be followed up as soon as possible.  Feel free to call the clinic should you have any questions or concerns. The clinic phone number is [phone].  Please show the [place] at check-in to the Emergency Department and triage nurse.

## 2017-09-27 NOTE — Patient Instructions (Signed)
Implanted Port Home Guide An implanted port is a type of central line that is placed under the skin. Central lines are used to provide IV access when treatment or nutrition needs to be given through a person's veins. Implanted ports are used for long-term IV access. An implanted port may be placed because:  You need IV medicine that would be irritating to the small veins in your hands or arms.  You need long-term IV medicines, such as antibiotics.  You need IV nutrition for a long period.  You need frequent blood draws for lab tests.  You need dialysis.  Implanted ports are usually placed in the chest area, but they can also be placed in the upper arm, the abdomen, or the leg. An implanted port has two main parts:  Reservoir. The reservoir is round and will appear as a small, raised area under your skin. The reservoir is the part where a needle is inserted to give medicines or draw blood.  Catheter. The catheter is a thin, flexible tube that extends from the reservoir. The catheter is placed into a large vein. Medicine that is inserted into the reservoir goes into the catheter and then into the vein.  How will I care for my incision site? Do not get the incision site wet. Bathe or shower as directed by your health care provider. How is my port accessed? Special steps must be taken to access the port:  Before the port is accessed, a numbing cream can be placed on the skin. This helps numb the skin over the port site.  Your health care provider uses a sterile technique to access the port. ? Your health care provider must put on a mask and sterile gloves. ? The skin over your port is cleaned carefully with an antiseptic and allowed to dry. ? The port is gently pinched between sterile gloves, and a needle is inserted into the port.  Only "non-coring" port needles should be used to access the port. Once the port is accessed, a blood return should be checked. This helps ensure that the port  is in the vein and is not clogged.  If your port needs to remain accessed for a constant infusion, a clear (transparent) bandage will be placed over the needle site. The bandage and needle will need to be changed every week, or as directed by your health care provider.  Keep the bandage covering the needle clean and dry. Do not get it wet. Follow your health care provider's instructions on how to take a shower or bath while the port is accessed.  If your port does not need to stay accessed, no bandage is needed over the port.  What is flushing? Flushing helps keep the port from getting clogged. Follow your health care provider's instructions on how and when to flush the port. Ports are usually flushed with saline solution or a medicine called heparin. The need for flushing will depend on how the port is used.  If the port is used for intermittent medicines or blood draws, the port will need to be flushed: ? After medicines have been given. ? After blood has been drawn. ? As part of routine maintenance.  If a constant infusion is running, the port may not need to be flushed.  How long will my port stay implanted? The port can stay in for as long as your health care provider thinks it is needed. When it is time for the port to come out, surgery will be   done to remove it. The procedure is similar to the one performed when the port was put in. When should I seek immediate medical care? When you have an implanted port, you should seek immediate medical care if:  You notice a bad smell coming from the incision site.  You have swelling, redness, or drainage at the incision site.  You have more swelling or pain at the port site or the surrounding area.  You have a fever that is not controlled with medicine.  This information is not intended to replace advice given to you by your health care provider. Make sure you discuss any questions you have with your health care provider. Document  Released: 12/19/2004 Document Revised: 05/27/2015 Document Reviewed: 08/26/2012 Elsevier Interactive Patient Education  2017 Elsevier Inc.  

## 2017-10-06 ENCOUNTER — Other Ambulatory Visit: Payer: Self-pay | Admitting: Oncology

## 2017-10-11 ENCOUNTER — Encounter: Payer: Self-pay | Admitting: Emergency Medicine

## 2017-10-11 ENCOUNTER — Inpatient Hospital Stay: Payer: PRIVATE HEALTH INSURANCE

## 2017-10-11 ENCOUNTER — Inpatient Hospital Stay: Payer: PRIVATE HEALTH INSURANCE | Attending: Oncology

## 2017-10-11 VITALS — BP 129/87 | HR 69 | Temp 98.7°F | Resp 16

## 2017-10-11 DIAGNOSIS — Z95828 Presence of other vascular implants and grafts: Secondary | ICD-10-CM

## 2017-10-11 DIAGNOSIS — Z5112 Encounter for antineoplastic immunotherapy: Secondary | ICD-10-CM | POA: Diagnosis not present

## 2017-10-11 DIAGNOSIS — C184 Malignant neoplasm of transverse colon: Secondary | ICD-10-CM

## 2017-10-11 DIAGNOSIS — C189 Malignant neoplasm of colon, unspecified: Secondary | ICD-10-CM

## 2017-10-11 LAB — CBC WITH DIFFERENTIAL (CANCER CENTER ONLY)
Abs Immature Granulocytes: 0.01 10*3/uL (ref 0.00–0.07)
BASOS ABS: 0.1 10*3/uL (ref 0.0–0.1)
Basophils Relative: 1 %
EOS PCT: 8 %
Eosinophils Absolute: 0.5 10*3/uL (ref 0.0–0.5)
HCT: 34.4 % — ABNORMAL LOW (ref 36.0–46.0)
HEMOGLOBIN: 11.5 g/dL — AB (ref 12.0–15.0)
Immature Granulocytes: 0 %
Lymphocytes Relative: 37 %
Lymphs Abs: 2.1 10*3/uL (ref 0.7–4.0)
MCH: 28.8 pg (ref 26.0–34.0)
MCHC: 33.4 g/dL (ref 30.0–36.0)
MCV: 86 fL (ref 80.0–100.0)
Monocytes Absolute: 0.5 10*3/uL (ref 0.1–1.0)
Monocytes Relative: 8 %
NRBC: 0 % (ref 0.0–0.2)
Neutro Abs: 2.6 10*3/uL (ref 1.7–7.7)
Neutrophils Relative %: 46 %
Platelet Count: 199 10*3/uL (ref 150–400)
RBC: 4 MIL/uL (ref 3.87–5.11)
RDW: 13.2 % (ref 11.5–15.5)
WBC Count: 5.7 10*3/uL (ref 4.0–10.5)

## 2017-10-11 LAB — CMP (CANCER CENTER ONLY)
ALK PHOS: 98 U/L (ref 38–126)
ALT: 21 U/L (ref 0–44)
AST: 19 U/L (ref 15–41)
Albumin: 3.4 g/dL — ABNORMAL LOW (ref 3.5–5.0)
Anion gap: 9 (ref 5–15)
BILIRUBIN TOTAL: 0.4 mg/dL (ref 0.3–1.2)
BUN: 12 mg/dL (ref 6–20)
CALCIUM: 8.9 mg/dL (ref 8.9–10.3)
CO2: 22 mmol/L (ref 22–32)
CREATININE: 0.98 mg/dL (ref 0.44–1.00)
Chloride: 110 mmol/L (ref 98–111)
GFR, Estimated: >60 mL/min (ref >60)
GLUCOSE: 117 mg/dL — AB (ref 70–99)
Potassium: 4 mmol/L (ref 3.5–5.1)
SODIUM: 141 mmol/L (ref 135–145)
TOTAL PROTEIN: 6.5 g/dL (ref 6.5–8.1)

## 2017-10-11 LAB — MAGNESIUM: Magnesium: 1.2 mg/dL — CL (ref 1.7–2.4)

## 2017-10-11 MED ORDER — HEPARIN SOD (PORK) LOCK FLUSH 100 UNIT/ML IV SOLN
500.0000 [IU] | Freq: Once | INTRAVENOUS | Status: AC | PRN
  Administered 2017-10-11: 500 [IU]
  Filled 2017-10-11: qty 5

## 2017-10-11 MED ORDER — SODIUM CHLORIDE 0.9 % IJ SOLN
10.0000 mL | INTRAMUSCULAR | Status: DC | PRN
  Administered 2017-10-11: 10 mL via INTRAVENOUS
  Filled 2017-10-11: qty 10

## 2017-10-11 MED ORDER — SODIUM CHLORIDE 0.9 % IV SOLN
6.0000 mg/kg | Freq: Once | INTRAVENOUS | Status: AC
  Administered 2017-10-11: 600 mg via INTRAVENOUS
  Filled 2017-10-11: qty 30

## 2017-10-11 MED ORDER — SODIUM CHLORIDE 0.9 % IV SOLN
Freq: Once | INTRAVENOUS | Status: AC
  Administered 2017-10-11: 10:00:00 via INTRAVENOUS
  Filled 2017-10-11: qty 250

## 2017-10-11 MED ORDER — SODIUM CHLORIDE 0.9% FLUSH
10.0000 mL | INTRAVENOUS | Status: DC | PRN
  Administered 2017-10-11: 10 mL
  Filled 2017-10-11: qty 10

## 2017-10-11 NOTE — Progress Notes (Signed)
Critical Result Notification- Mag 1.2 / Ned Card, NP made aware.

## 2017-10-11 NOTE — Patient Instructions (Signed)
[facility] Discharge Instructions for Patients Receiving Chemotherapy  Today you received the following: Vectibix.  To help prevent nausea and vomiting after your treatment, we encourage you to take your nausea medication as directed.   If you develop nausea and vomiting that is not controlled by your nausea medication, call the clinic.   BELOW ARE SYMPTOMS THAT SHOULD BE REPORTED IMMEDIATELY:  *FEVER GREATER THAN 100.5 F  *CHILLS WITH OR WITHOUT FEVER  NAUSEA AND VOMITING THAT IS NOT CONTROLLED WITH YOUR NAUSEA MEDICATION  *UNUSUAL SHORTNESS OF BREATH  *UNUSUAL BRUISING OR BLEEDING  TENDERNESS IN MOUTH AND THROAT WITH OR WITHOUT PRESENCE OF ULCERS  *URINARY PROBLEMS  *BOWEL PROBLEMS  UNUSUAL RASH Items with * indicate a potential emergency and should be followed up as soon as possible.  Feel free to call the clinic should you have any questions or concerns. The clinic phone number is [phone].  Please show the [place] at check-in to the Emergency Department and triage nurse.

## 2017-10-11 NOTE — Progress Notes (Signed)
Pt ok to tx with Mag 1.2 today per Dr. [name].

## 2017-10-20 ENCOUNTER — Other Ambulatory Visit: Payer: Self-pay | Admitting: Oncology

## 2017-10-25 ENCOUNTER — Telehealth: Payer: Self-pay | Admitting: Oncology

## 2017-10-25 ENCOUNTER — Inpatient Hospital Stay (HOSPITAL_BASED_OUTPATIENT_CLINIC_OR_DEPARTMENT_OTHER): Payer: PRIVATE HEALTH INSURANCE | Admitting: Oncology

## 2017-10-25 ENCOUNTER — Inpatient Hospital Stay: Payer: PRIVATE HEALTH INSURANCE

## 2017-10-25 VITALS — BP 123/79 | HR 73 | Temp 98.6°F | Resp 18 | Ht 64.2 in | Wt 223.0 lb

## 2017-10-25 DIAGNOSIS — C184 Malignant neoplasm of transverse colon: Secondary | ICD-10-CM

## 2017-10-25 DIAGNOSIS — Z95828 Presence of other vascular implants and grafts: Secondary | ICD-10-CM

## 2017-10-25 DIAGNOSIS — Z5112 Encounter for antineoplastic immunotherapy: Secondary | ICD-10-CM | POA: Diagnosis not present

## 2017-10-25 DIAGNOSIS — C189 Malignant neoplasm of colon, unspecified: Secondary | ICD-10-CM

## 2017-10-25 LAB — CMP (CANCER CENTER ONLY)
ALT: 30 U/L (ref 0–44)
AST: 26 U/L (ref 15–41)
Albumin: 3.5 g/dL (ref 3.5–5.0)
Alkaline Phosphatase: 112 U/L (ref 38–126)
Anion gap: 12 (ref 5–15)
BUN: 11 mg/dL (ref 6–20)
CHLORIDE: 109 mmol/L (ref 98–111)
CO2: 22 mmol/L (ref 22–32)
Calcium: 9.1 mg/dL (ref 8.9–10.3)
Creatinine: 1.03 mg/dL — ABNORMAL HIGH (ref 0.44–1.00)
GFR, Est AFR Am: >60 mL/min (ref >60)
GFR, Estimated: >60 mL/min (ref >60)
GLUCOSE: 85 mg/dL (ref 70–99)
POTASSIUM: 4.1 mmol/L (ref 3.5–5.1)
Sodium: 143 mmol/L (ref 135–145)
Total Bilirubin: 0.3 mg/dL (ref 0.3–1.2)
Total Protein: 6.9 g/dL (ref 6.5–8.1)

## 2017-10-25 LAB — CBC WITH DIFFERENTIAL (CANCER CENTER ONLY)
ABS IMMATURE GRANULOCYTES: 0.01 10*3/uL (ref 0.00–0.07)
Basophils Absolute: 0.1 10*3/uL (ref 0.0–0.1)
Basophils Relative: 1 %
Eosinophils Absolute: 0.5 10*3/uL (ref 0.0–0.5)
Eosinophils Relative: 8 %
HCT: 38.5 % (ref 36.0–46.0)
HEMOGLOBIN: 12.7 g/dL (ref 12.0–15.0)
IMMATURE GRANULOCYTES: 0 %
LYMPHS PCT: 37 %
Lymphs Abs: 2.3 10*3/uL (ref 0.7–4.0)
MCH: 28.5 pg (ref 26.0–34.0)
MCHC: 33 g/dL (ref 30.0–36.0)
MCV: 86.5 fL (ref 80.0–100.0)
MONO ABS: 0.5 10*3/uL (ref 0.1–1.0)
MONOS PCT: 8 %
NEUTROS ABS: 2.8 10*3/uL (ref 1.7–7.7)
Neutrophils Relative %: 46 %
Platelet Count: 228 10*3/uL (ref 150–400)
RBC: 4.45 MIL/uL (ref 3.87–5.11)
RDW: 12.9 % (ref 11.5–15.5)
WBC Count: 6.1 10*3/uL (ref 4.0–10.5)
nRBC: 0 % (ref 0.0–0.2)

## 2017-10-25 LAB — MAGNESIUM: MAGNESIUM: 1.4 mg/dL — AB (ref 1.7–2.4)

## 2017-10-25 MED ORDER — HEPARIN SOD (PORK) LOCK FLUSH 100 UNIT/ML IV SOLN
500.0000 [IU] | Freq: Once | INTRAVENOUS | Status: AC | PRN
  Administered 2017-10-25: 500 [IU]
  Filled 2017-10-25: qty 5

## 2017-10-25 MED ORDER — SODIUM CHLORIDE 0.9 % IV SOLN
6.0000 mg/kg | Freq: Once | INTRAVENOUS | Status: AC
  Administered 2017-10-25: 600 mg via INTRAVENOUS
  Filled 2017-10-25: qty 10

## 2017-10-25 MED ORDER — SODIUM CHLORIDE 0.9% FLUSH
10.0000 mL | INTRAVENOUS | Status: DC | PRN
  Administered 2017-10-25: 10 mL
  Filled 2017-10-25: qty 10

## 2017-10-25 MED ORDER — SODIUM CHLORIDE 0.9 % IV SOLN
Freq: Once | INTRAVENOUS | Status: AC
  Administered 2017-10-25: 10:00:00 via INTRAVENOUS
  Filled 2017-10-25: qty 250

## 2017-10-25 MED ORDER — SODIUM CHLORIDE 0.9 % IJ SOLN
10.0000 mL | INTRAMUSCULAR | Status: DC | PRN
  Administered 2017-10-25: 10 mL via INTRAVENOUS
  Filled 2017-10-25: qty 10

## 2017-10-25 NOTE — Patient Instructions (Signed)
[facility] Discharge Instructions for Patients Receiving Chemotherapy  Today you received the following immunotherapy:  Vectibix  To help prevent nausea and vomiting after your treatment, we encourage you to take your nausea medication as prescribed.   If you develop nausea and vomiting that is not controlled by your nausea medication, call the clinic.   BELOW ARE SYMPTOMS THAT SHOULD BE REPORTED IMMEDIATELY:  *FEVER GREATER THAN 100.5 F  *CHILLS WITH OR WITHOUT FEVER  NAUSEA AND VOMITING THAT IS NOT CONTROLLED WITH YOUR NAUSEA MEDICATION  *UNUSUAL SHORTNESS OF BREATH  *UNUSUAL BRUISING OR BLEEDING  TENDERNESS IN MOUTH AND THROAT WITH OR WITHOUT PRESENCE OF ULCERS  *URINARY PROBLEMS  *BOWEL PROBLEMS  UNUSUAL RASH Items with * indicate a potential emergency and should be followed up as soon as possible.  Feel free to call the clinic should you have any questions or concerns. The clinic phone number is [phone].  Please show the CHEMO ALERT CARD at check-in to the Emergency Department and triage nurse.   

## 2017-10-25 NOTE — Progress Notes (Signed)
[facility] OFFICE PROGRESS NOTE   Diagnosis: Colon cancer  INTERVAL HISTORY:   Brenda Becker returns as scheduled.  She continues every 2-week panitumumab.  She had an influenza vaccine on 10/15/2017.  2 days later she had an episode of pain in the right trapezius area lasting for several hours.  She used a topical muscle cream and this helped.  No associated symptoms.  She received the flu shot in the left arm.  She has occasional leg cramps.  No leg swelling.  Objective:  Vital signs in last 24 hours:  Blood pressure 123/79, pulse 73, temperature 98.6 F (37 C), temperature source Oral, resp. rate 18, height 5' 4.2" (1.631 m), weight 223 lb (101.2 kg), SpO2 100 %.    HEENT: No thrush or ulcers Resp: Lungs clear bilaterally Cardio: Regular rate and rhythm GI: No hepatomegaly, nontender Vascular: No leg edema Skin: Acne type rash over the face, trunk, and extremities, paronychia at the right third finger Musculoskeletal: No pain with motion of the right shoulder.  Examination of the right trapezius area is unremarkable  Portacath/PICC-without erythema  Lab Results:  Lab Results  Component Value Date   WBC 6.1 10/25/2017   HGB 12.7 10/25/2017   HCT 38.5 10/25/2017   MCV 86.5 10/25/2017   PLT 228 10/25/2017   NEUTROABS 2.8 10/25/2017    CMP  Lab Results  Component Value Date   NA 143 10/25/2017   K 4.1 10/25/2017   CL 109 10/25/2017   CO2 22 10/25/2017   GLUCOSE 85 10/25/2017   BUN 11 10/25/2017   CREATININE 1.03 (H) 10/25/2017   CALCIUM 9.1 10/25/2017   PROT 6.9 10/25/2017   ALBUMIN 3.5 10/25/2017   AST 26 10/25/2017   ALT 30 10/25/2017   ALKPHOS 112 10/25/2017   BILITOT 0.3 10/25/2017   GFRNONAA >60 10/25/2017   GFRAA >60 10/25/2017    Lab Results  Component Value Date   CEA1 1.86 06/01/2016    Medications: I have reviewed the patient's current medications.   Assessment/Plan: 1. Stage IIIc (T3N2b) well-differentiated adenocarcinoma  the transverse colon, status post a partial colectomy 11/16/2014,MSI-stable, no BRAF or RAS mutation  10/17 lymph nodes positive for metastatic adenocarcinoma, positive tumor deposits  Staging PET scan 10/30/2014 negative for distant metastatic disease   Cycle 1 CAPOX 12/30/2014.  Cycle 2 CAPOX 01/22/2015  Cycle 3 CAPOX 02/12/2015  Cycle 4 CAPOX 03/05/2015  Cycle 5 CAPOX 03/26/2015  Cycle 6 CAPOX 04/16/2015  Cycle 7 CAPOX 05/07/2015 (oxaliplatin held due to progressive neuropathy)  Cycle 8 CAPOX 05/28/2015 (oxaliplatin held secondary to neuropathy)  CTs 11/01/2015-no significant change and an 11 mm upper abdomen mesenteric lymph node with partial calcification-treated metastasis?, No other evidence of metastatic disease  CTs 01/21/2016-mild interval progression of previously identified calcified nodal tissue in the upper central abdomen. Development of small adjacent lymph nodes in the interval.  EUS guided biopsy of the calcified upper abdominal mass on 02/17/2016 confirmed metastatic adenocarcinoma  Cycle 1 FOLFIRI/Avastin 03/09/2016  Cycle 2 FOLFIRI/Avastin 03/23/2016  Cycle 3 FOLFIRI/Avastin 04/06/2016 (Neulasta added)  Cycle 4 FOLFIRI/Avastin 04/20/2016  Cycle 5 FOLFIRI/Avastin 05/04/2016  Cycle 6 FOLFIRI/Avastin 05/18/2016  CT abdomen/pelvis 05/30/2016-new retroperitoneal lymphadenopathy in the para-aortic region with the largest node measuring 1.5 cm. Partially calcified mesenteric lymph node measures 12 mm as compared to 14 mm previously. Other scattered less than 1 cmmesenteric lymph nodes remain stable.  PET scan 06/13/2016-hypermetabolic posterior mediastinal node, hypermetabolic retroperitoneal lymph nodes, stable transverse mesocolon mass, bilateral hypermetabolic inguinal nodes  EUS biopsy of posterior mediastinal lymph nodes on 07/20/2016-positive for metastatic adenocarcinoma  Clinical trial at [facility] Panitumumab, nivolumab,  ipilimumab.  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 No change in small calcified mesenteric and retroperitoneal abdominal lymph nodes. No evidence of metastatic disease in the chest.  Maintenance panitumumab beginning 03/29/2017, healed 05/24/2017 secondary to elevated liver enzymes-resumed 06/07/2017  Restaging CTs at [facility] 06/04/2017-no evidence of disease progression, unchanged scattered mesenteric/retroperitoneal lymph nodes  Panitumumab every 2 weeks  Restaging CTs 08/27/2017- no evidence of metastatic disease in the chest;unchanged size and appearance of scattered calcified and noncalcified mesenteric and retroperitoneal lymph nodes. Near complete resolution of previously described haziness within the central mesentery. No new evidence of recurrent or metastatic disease within the abdomen or pelvis.  Continue Panitumumab every 2 weeks 2. History of Microcytic anemia-likely iron deficiency anemia secondary to #1 and ongoing menses 3. Status post cholecystectomy 4. Family history of breast and ovarian cancer. She has seen the genetics counselor. 5. Delayed nausea following cycle 1 CAPOX. Aloxi and Emend added beginning with cycle 2. 6. Transient episodes of dyspnea following cycle 4 and cycle 5 CAPOX, likely neurotoxicity from oxaliplatin 7. Oxaliplatin neuropathy-improved 8. Mild neutropenia secondary to chemotherapy-Neulasta added beginning with cycle 3 FOLFIRI/Avastin 9. Mild mucositis following cycle 4 FOLFIRI/Avastin 10. Mammogram 05/11/2016-possible mass right breast. Right breast ultrasound 05/17/2016-right breast mass at 10:00 most likely represents a cyst,  however is somewhat hypoechoic and could also represent a fibroadenoma. Ultrasound-guided aspiration of a 1 cmbenign cyst 05/22/2016. 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 12. Hypomagnesia secondary to Panitumumab. She continues magnesium oxide. 13. Elevated liver enzymes May 2019-likely secondary to resuming atorvastatin, now normal 14. Paronychia related to Panitumumab.    Disposition: Brenda Becker appears unchanged.  She will continue every 2-week panitumumab.  The etiology of the acute right trapezius pain last week is unclear.  This may have been related to the influenza vaccine.  She will return for an office visit in 1 month.  She will contact us for new symptoms in the interim.  15 minutes were spent with the patient today.  The majority of the time was used for counseling and coordination of care.  Betsy Coder, MD  10/25/2017  9:39 AM

## 2017-10-25 NOTE — Telephone Encounter (Signed)
No 10/24 los.

## 2017-10-25 NOTE — Progress Notes (Signed)
Critical Magnesium result of 1.4 reviewed with Dr. [name] ok to treat without new orders today.

## 2017-10-29 ENCOUNTER — Other Ambulatory Visit: Payer: Self-pay | Admitting: *Deleted

## 2017-10-29 DIAGNOSIS — C189 Malignant neoplasm of colon, unspecified: Secondary | ICD-10-CM

## 2017-10-29 MED ORDER — POTASSIUM CHLORIDE CRYS ER 20 MEQ PO TBCR: EXTENDED_RELEASE_TABLET | ORAL | 2 refills | Status: DC

## 2017-11-04 ENCOUNTER — Other Ambulatory Visit: Payer: Self-pay | Admitting: Oncology

## 2017-11-08 ENCOUNTER — Inpatient Hospital Stay: Payer: PRIVATE HEALTH INSURANCE

## 2017-11-08 ENCOUNTER — Inpatient Hospital Stay: Payer: PRIVATE HEALTH INSURANCE | Attending: Oncology

## 2017-11-08 VITALS — BP 136/88 | HR 68 | Temp 98.8°F | Resp 14

## 2017-11-08 DIAGNOSIS — Z5112 Encounter for antineoplastic immunotherapy: Secondary | ICD-10-CM | POA: Diagnosis present

## 2017-11-08 DIAGNOSIS — C184 Malignant neoplasm of transverse colon: Secondary | ICD-10-CM | POA: Diagnosis present

## 2017-11-08 DIAGNOSIS — Z95828 Presence of other vascular implants and grafts: Secondary | ICD-10-CM

## 2017-11-08 DIAGNOSIS — C189 Malignant neoplasm of colon, unspecified: Secondary | ICD-10-CM

## 2017-11-08 LAB — CMP (CANCER CENTER ONLY)
ALK PHOS: 95 U/L (ref 38–126)
ALT: 23 U/L (ref 0–44)
AST: 17 U/L (ref 15–41)
Albumin: 3.3 g/dL — ABNORMAL LOW (ref 3.5–5.0)
Anion gap: 8 (ref 5–15)
BUN: 10 mg/dL (ref 6–20)
CALCIUM: 8.7 mg/dL — AB (ref 8.9–10.3)
CHLORIDE: 110 mmol/L (ref 98–111)
CO2: 24 mmol/L (ref 22–32)
CREATININE: 0.97 mg/dL (ref 0.44–1.00)
GFR, Estimated: >60 mL/min (ref >60)
Glucose, Bld: 87 mg/dL (ref 70–99)
Potassium: 4 mmol/L (ref 3.5–5.1)
Sodium: 142 mmol/L (ref 135–145)
Total Bilirubin: 0.3 mg/dL (ref 0.3–1.2)
Total Protein: 6.6 g/dL (ref 6.5–8.1)

## 2017-11-08 LAB — MAGNESIUM: MAGNESIUM: 1.3 mg/dL — AB (ref 1.7–2.4)

## 2017-11-08 MED ORDER — SODIUM CHLORIDE 0.9 % IV SOLN
Freq: Once | INTRAVENOUS | Status: AC
  Administered 2017-11-08: 10:00:00 via INTRAVENOUS
  Filled 2017-11-08: qty 250

## 2017-11-08 MED ORDER — SODIUM CHLORIDE 0.9% FLUSH
10.0000 mL | INTRAVENOUS | Status: DC | PRN
  Filled 2017-11-08: qty 10

## 2017-11-08 MED ORDER — SODIUM CHLORIDE 0.9% FLUSH
10.0000 mL | Freq: Once | INTRAVENOUS | Status: AC
  Administered 2017-11-08: 10 mL
  Filled 2017-11-08: qty 10

## 2017-11-08 MED ORDER — SODIUM CHLORIDE 0.9 % IV SOLN
6.0000 mg/kg | Freq: Once | INTRAVENOUS | Status: AC
  Administered 2017-11-08: 600 mg via INTRAVENOUS
  Filled 2017-11-08: qty 20

## 2017-11-08 MED ORDER — HEPARIN SOD (PORK) LOCK FLUSH 100 UNIT/ML IV SOLN
500.0000 [IU] | Freq: Once | INTRAVENOUS | Status: DC | PRN
  Filled 2017-11-08: qty 5

## 2017-11-08 NOTE — Progress Notes (Signed)
Labs reviewed. Ok to treat today with results of mag 1.4 per Dr. [name].

## 2017-11-08 NOTE — Patient Instructions (Signed)
[facility] Discharge Instructions for Patients Receiving Chemotherapy  Today you received the following immunotherapy:  Vectibix  To help prevent nausea and vomiting after your treatment, we encourage you to take your nausea medication as prescribed.   If you develop nausea and vomiting that is not controlled by your nausea medication, call the clinic.   BELOW ARE SYMPTOMS THAT SHOULD BE REPORTED IMMEDIATELY:  *FEVER GREATER THAN 100.5 F  *CHILLS WITH OR WITHOUT FEVER  NAUSEA AND VOMITING THAT IS NOT CONTROLLED WITH YOUR NAUSEA MEDICATION  *UNUSUAL SHORTNESS OF BREATH  *UNUSUAL BRUISING OR BLEEDING  TENDERNESS IN MOUTH AND THROAT WITH OR WITHOUT PRESENCE OF ULCERS  *URINARY PROBLEMS  *BOWEL PROBLEMS  UNUSUAL RASH Items with * indicate a potential emergency and should be followed up as soon as possible.  Feel free to call the clinic should you have any questions or concerns. The clinic phone number is [phone].  Please show the [place] at check-in to the Emergency Department and triage nurse.

## 2017-11-18 ENCOUNTER — Other Ambulatory Visit: Payer: Self-pay | Admitting: Oncology

## 2017-11-22 ENCOUNTER — Inpatient Hospital Stay: Payer: PRIVATE HEALTH INSURANCE

## 2017-11-22 ENCOUNTER — Encounter: Payer: Self-pay | Admitting: Nurse Practitioner

## 2017-11-22 ENCOUNTER — Inpatient Hospital Stay (HOSPITAL_BASED_OUTPATIENT_CLINIC_OR_DEPARTMENT_OTHER): Payer: PRIVATE HEALTH INSURANCE | Admitting: Nurse Practitioner

## 2017-11-22 ENCOUNTER — Encounter: Payer: Self-pay | Admitting: Emergency Medicine

## 2017-11-22 ENCOUNTER — Telehealth: Payer: Self-pay

## 2017-11-22 VITALS — BP 128/87 | HR 65 | Temp 98.3°F | Resp 17 | Ht 64.2 in | Wt 224.6 lb

## 2017-11-22 DIAGNOSIS — C184 Malignant neoplasm of transverse colon: Secondary | ICD-10-CM

## 2017-11-22 DIAGNOSIS — C189 Malignant neoplasm of colon, unspecified: Secondary | ICD-10-CM

## 2017-11-22 DIAGNOSIS — R21 Rash and other nonspecific skin eruption: Secondary | ICD-10-CM | POA: Diagnosis not present

## 2017-11-22 DIAGNOSIS — Z5112 Encounter for antineoplastic immunotherapy: Secondary | ICD-10-CM | POA: Diagnosis not present

## 2017-11-22 LAB — CMP (CANCER CENTER ONLY)
ALBUMIN: 3.6 g/dL (ref 3.5–5.0)
ALK PHOS: 107 U/L (ref 38–126)
ALT: 26 U/L (ref 0–44)
AST: 23 U/L (ref 15–41)
Anion gap: 9 (ref 5–15)
BILIRUBIN TOTAL: 0.3 mg/dL (ref 0.3–1.2)
BUN: 10 mg/dL (ref 6–20)
CALCIUM: 8.8 mg/dL — AB (ref 8.9–10.3)
CO2: 24 mmol/L (ref 22–32)
Chloride: 110 mmol/L (ref 98–111)
Creatinine: 1.02 mg/dL — ABNORMAL HIGH (ref 0.44–1.00)
GFR, Est AFR Am: >60 mL/min (ref >60)
GFR, Estimated: >60 mL/min (ref >60)
GLUCOSE: 93 mg/dL (ref 70–99)
POTASSIUM: 4.1 mmol/L (ref 3.5–5.1)
SODIUM: 143 mmol/L (ref 135–145)
TOTAL PROTEIN: 6.9 g/dL (ref 6.5–8.1)

## 2017-11-22 LAB — CEA (IN HOUSE-CHCC)

## 2017-11-22 LAB — MAGNESIUM: Magnesium: 1.3 mg/dL — CL (ref 1.7–2.4)

## 2017-11-22 MED ORDER — SODIUM CHLORIDE 0.9 % IV SOLN
Freq: Once | INTRAVENOUS | Status: AC
  Administered 2017-11-22: 11:00:00 via INTRAVENOUS
  Filled 2017-11-22: qty 250

## 2017-11-22 MED ORDER — SODIUM CHLORIDE 0.9% FLUSH
10.0000 mL | INTRAVENOUS | Status: DC | PRN
  Administered 2017-11-22: 10 mL
  Filled 2017-11-22: qty 10

## 2017-11-22 MED ORDER — SODIUM CHLORIDE 0.9 % IV SOLN
6.0000 mg/kg | Freq: Once | INTRAVENOUS | Status: AC
  Administered 2017-11-22: 600 mg via INTRAVENOUS
  Filled 2017-11-22: qty 20

## 2017-11-22 MED ORDER — HEPARIN SOD (PORK) LOCK FLUSH 100 UNIT/ML IV SOLN
500.0000 [IU] | Freq: Once | INTRAVENOUS | Status: AC | PRN
  Administered 2017-11-22: 500 [IU]
  Filled 2017-11-22: qty 5

## 2017-11-22 NOTE — Progress Notes (Signed)
Ok to treat w/ mag of 1.3 and no cbc per Ned Card, NP.

## 2017-11-22 NOTE — Patient Instructions (Signed)
[facility] Discharge Instructions for Patients Receiving Chemotherapy  Today you received the following chemotherapy agents: Vectibix.  To help prevent nausea and vomiting after your treatment, we encourage you to take your nausea medication as directed.   If you develop nausea and vomiting that is not controlled by your nausea medication, call the clinic.   BELOW ARE SYMPTOMS THAT SHOULD BE REPORTED IMMEDIATELY:  *FEVER GREATER THAN 100.5 F  *CHILLS WITH OR WITHOUT FEVER  NAUSEA AND VOMITING THAT IS NOT CONTROLLED WITH YOUR NAUSEA MEDICATION  *UNUSUAL SHORTNESS OF BREATH  *UNUSUAL BRUISING OR BLEEDING  TENDERNESS IN MOUTH AND THROAT WITH OR WITHOUT PRESENCE OF ULCERS  *URINARY PROBLEMS  *BOWEL PROBLEMS  UNUSUAL RASH Items with * indicate a potential emergency and should be followed up as soon as possible.  Feel free to call the clinic should you have any questions or concerns. The clinic phone number is [phone].  Please show the CHEMO ALERT CARD at check-in to the Emergency Department and triage nurse.   

## 2017-11-22 NOTE — Progress Notes (Signed)
[place] OFFICE PROGRESS NOTE   Diagnosis: Colon cancer  INTERVAL HISTORY:   Brenda Becker returns as scheduled.  She continues every 2-week Panitumumab.  She denies nausea/vomiting.  No mouth sores.  No change in baseline intermittent loose stools.  No abdominal pain.  Skin rash varies.  She notes that it worsens after a hot shower or bath.  She has a good appetite.  No shortness of breath.  No leg swelling or calf pain.  Objective:  Vital signs in last 24 hours:  Blood pressure 128/87, pulse 65, temperature 98.3 F (36.8 C), temperature source Oral, resp. rate 17, height 5' 4.2" (1.631 m), weight 224 lb 9.6 oz (101.9 kg), SpO2 100 %.    HEENT: No thrush or ulcers. Resp: Lungs clear bilaterally. Cardio: Regular rate and rhythm. GI: Abdomen soft and nontender.  No hepatomegaly. Vascular: No leg edema.  Skin: Acne type rash scattered over the face and trunk. Port-A-Cath without erythema.   Lab Results:  Lab Results  Component Value Date   WBC 6.1 10/25/2017   HGB 12.7 10/25/2017   HCT 38.5 10/25/2017   MCV 86.5 10/25/2017   PLT 228 10/25/2017   NEUTROABS 2.8 10/25/2017    Imaging:  No results found.  Medications: I have reviewed the patient's current medications.  Assessment/Plan: 1. 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  Cycle 2 CAPOX 01/22/2015  Cycle 3 CAPOX 02/12/2015  Cycle 4 CAPOX 03/05/2015  Cycle 5 CAPOX 03/26/2015  Cycle 6 CAPOX 04/16/2015  Cycle 7 CAPOX 05/07/2015 (oxaliplatin held due to progressive neuropathy)  Cycle 8 CAPOX 05/28/2015 (oxaliplatin held secondary to neuropathy)  CTs 11/01/2015-no significant change and an 11 mm  upper abdomen mesenteric lymph node with partial calcification-treated metastasis?, No other evidence of metastatic disease  CTs 01/21/2016-mild interval progression of previously identified calcified nodal tissue in the upper central abdomen. Development of small adjacent lymph nodes in the interval.  EUS guided biopsy of the calcified upper abdominal mass on 02/17/2016 confirmed metastatic adenocarcinoma  Cycle 1 FOLFIRI/Avastin 03/09/2016  Cycle 2 FOLFIRI/Avastin 03/23/2016  Cycle 3 FOLFIRI/Avastin 04/06/2016 (Neulasta added)  Cycle 4 FOLFIRI/Avastin 04/20/2016  Cycle 5 FOLFIRI/Avastin 05/04/2016  Cycle 6 FOLFIRI/Avastin 05/18/2016  CT abdomen/pelvis 05/30/2016-new retroperitoneal lymphadenopathy in the para-aortic region with the largest node measuring 1.5 cm. Partially calcified mesenteric lymph node measures 12 mm as compared to 14 mm previously. Other scattered less than 1 cmmesenteric lymph nodes remain stable.  PET scan 06/13/2016-hypermetabolic posterior mediastinal node, hypermetabolic retroperitoneal lymph nodes, stable transverse mesocolon mass, bilateral hypermetabolic inguinal nodes  EUS biopsy of posterior mediastinal lymph nodes on 07/20/2016-positive for metastatic adenocarcinoma  Clinical trial at [facility], [id number] Panitumumab, nivolumab, ipilimumab.  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 No change in small calcified mesenteric and retroperitoneal abdominal lymph nodes. No evidence of metastatic disease in the chest.  Maintenance panitumumab beginning 03/29/2017, healed 05/24/2017 secondary to elevated liver enzymes-resumed  06/07/2017  [facility] 06/04/2017-no evidence of disease progression, unchanged scattered mesenteric/retroperitoneal lymph nodes  Panitumumab every  2 weeks  Restaging CTs 08/27/2017-no evidence of metastatic disease in the chest;unchanged size and appearance of scattered calcified and noncalcified mesenteric and retroperitoneal lymph nodes. Near complete resolution of previously described haziness within the central mesentery. No new evidence of recurrent or metastatic disease within the abdomen or pelvis.  Continue Panitumumab every 2 weeks 2. History of Microcytic anemia-likely iron deficiency anemia secondary to #1 and ongoing menses 3. Status post cholecystectomy 4. Family history of breast and ovarian cancer. She has seen the genetics counselor. 5. Delayed nausea following cycle 1 CAPOX. Aloxi and Emend added beginning with cycle 2. 6. Transient episodes of dyspnea following cycle 4 and cycle 5 CAPOX, likely neurotoxicity from oxaliplatin 7. Oxaliplatin neuropathy-improved 8. Mild neutropenia secondary to chemotherapy-Neulasta added beginning with cycle 3 FOLFIRI/Avastin 9. Mild mucositis following cycle 4 FOLFIRI/Avastin 10. Mammogram 05/11/2016-possible mass right breast. Right breast ultrasound 05/17/2016-right breast mass at 10:00 most likely represents a cyst, however is somewhat hypoechoic and could also represent a fibroadenoma. Ultrasound-guided aspiration of a 1 cmbenign cyst 05/22/2016. 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 12. Hypomagnesia secondary to Panitumumab. She continues magnesium oxide. 13. Elevated liver enzymes May 2019-likely secondary to resuming atorvastatin, now normal 14. Paronychia related to Panitumumab.    Disposition: Brenda Becker appears stable.  There is no clinical evidence of disease progression.  Plan to continue every 2-week Panitumumab.  She will return for lab and Panitumumab in 2 weeks.  We will see  her in follow-up prior to Panitumumab in 4 weeks.  She will contact the office in the interim with any problems.    [name]   11/22/2017  10:28 AM

## 2017-11-22 NOTE — Telephone Encounter (Signed)
Printed avs and calender of upcoming appointment. Per 11/21 los 

## 2017-12-02 ENCOUNTER — Other Ambulatory Visit: Payer: Self-pay | Admitting: Oncology

## 2017-12-06 ENCOUNTER — Inpatient Hospital Stay: Payer: PRIVATE HEALTH INSURANCE | Attending: Oncology

## 2017-12-06 ENCOUNTER — Inpatient Hospital Stay: Payer: PRIVATE HEALTH INSURANCE

## 2017-12-06 VITALS — BP 134/80 | HR 74 | Temp 98.3°F | Resp 18

## 2017-12-06 DIAGNOSIS — C189 Malignant neoplasm of colon, unspecified: Secondary | ICD-10-CM

## 2017-12-06 DIAGNOSIS — C184 Malignant neoplasm of transverse colon: Secondary | ICD-10-CM

## 2017-12-06 DIAGNOSIS — Z95828 Presence of other vascular implants and grafts: Secondary | ICD-10-CM

## 2017-12-06 DIAGNOSIS — Z5112 Encounter for antineoplastic immunotherapy: Secondary | ICD-10-CM | POA: Diagnosis present

## 2017-12-06 LAB — CBC WITH DIFFERENTIAL (CANCER CENTER ONLY)
Abs Immature Granulocytes: 0.02 10*3/uL (ref 0.00–0.07)
Basophils Absolute: 0.1 10*3/uL (ref 0.0–0.1)
Basophils Relative: 1 %
Eosinophils Absolute: 0.5 10*3/uL (ref 0.0–0.5)
Eosinophils Relative: 8 %
HCT: 37.2 % (ref 36.0–46.0)
Hemoglobin: 12.2 g/dL (ref 12.0–15.0)
IMMATURE GRANULOCYTES: 0 %
Lymphocytes Relative: 36 %
Lymphs Abs: 2.3 10*3/uL (ref 0.7–4.0)
MCH: 28.4 pg (ref 26.0–34.0)
MCHC: 32.8 g/dL (ref 30.0–36.0)
MCV: 86.5 fL (ref 80.0–100.0)
Monocytes Absolute: 0.5 10*3/uL (ref 0.1–1.0)
Monocytes Relative: 8 %
NEUTROS PCT: 47 %
NRBC: 0 % (ref 0.0–0.2)
Neutro Abs: 3.1 10*3/uL (ref 1.7–7.7)
Platelet Count: 231 10*3/uL (ref 150–400)
RBC: 4.3 MIL/uL (ref 3.87–5.11)
RDW: 13 % (ref 11.5–15.5)
WBC: 6.4 10*3/uL (ref 4.0–10.5)

## 2017-12-06 LAB — CMP (CANCER CENTER ONLY)
ALT: 26 U/L (ref 0–44)
AST: 24 U/L (ref 15–41)
Albumin: 3.5 g/dL (ref 3.5–5.0)
Alkaline Phosphatase: 96 U/L (ref 38–126)
Anion gap: 11 (ref 5–15)
BUN: 10 mg/dL (ref 6–20)
CO2: 20 mmol/L — ABNORMAL LOW (ref 22–32)
Calcium: 8.6 mg/dL — ABNORMAL LOW (ref 8.9–10.3)
Chloride: 111 mmol/L (ref 98–111)
Creatinine: 1.05 mg/dL — ABNORMAL HIGH (ref 0.44–1.00)
GFR, Est AFR Am: >60 mL/min (ref >60)
GFR, Estimated: >60 mL/min (ref >60)
Glucose, Bld: 110 mg/dL — ABNORMAL HIGH (ref 70–99)
Potassium: 3.8 mmol/L (ref 3.5–5.1)
Sodium: 142 mmol/L (ref 135–145)
Total Bilirubin: 0.3 mg/dL (ref 0.3–1.2)
Total Protein: 6.8 g/dL (ref 6.5–8.1)

## 2017-12-06 LAB — MAGNESIUM: Magnesium: 1.3 mg/dL — CL (ref 1.7–2.4)

## 2017-12-06 MED ORDER — SODIUM CHLORIDE 0.9% FLUSH
10.0000 mL | INTRAVENOUS | Status: DC | PRN
  Administered 2017-12-06: 10 mL
  Filled 2017-12-06: qty 10

## 2017-12-06 MED ORDER — SODIUM CHLORIDE 0.9 % IV SOLN
Freq: Once | INTRAVENOUS | Status: AC
  Administered 2017-12-06: 09:00:00 via INTRAVENOUS
  Filled 2017-12-06: qty 250

## 2017-12-06 MED ORDER — SODIUM CHLORIDE 0.9 % IV SOLN
6.0000 mg/kg | Freq: Once | INTRAVENOUS | Status: AC
  Administered 2017-12-06: 600 mg via INTRAVENOUS
  Filled 2017-12-06: qty 10

## 2017-12-06 MED ORDER — SODIUM CHLORIDE 0.9% FLUSH
10.0000 mL | Freq: Once | INTRAVENOUS | Status: AC
  Administered 2017-12-06: 10 mL
  Filled 2017-12-06: qty 10

## 2017-12-06 MED ORDER — HEPARIN SOD (PORK) LOCK FLUSH 100 UNIT/ML IV SOLN
500.0000 [IU] | Freq: Once | INTRAVENOUS | Status: AC | PRN
  Administered 2017-12-06: 500 [IU]
  Filled 2017-12-06: qty 5

## 2017-12-06 MED ORDER — MAGNESIUM SULFATE 2 GM/50ML IV SOLN
2.0000 g | Freq: Once | INTRAVENOUS | Status: AC
  Administered 2017-12-06: 2 g via INTRAVENOUS
  Filled 2017-12-06: qty 50

## 2017-12-06 NOTE — Progress Notes (Signed)
Per Dr. [name]: OK to treat with Magnesium level today. Confirmed with pt that she is taking oral Magnesium supplements at home. Dr. [name] would also like pt to receive IV Magnesium during treatment today. Burman Nieves HRVAC-QPE updated on plan.

## 2017-12-06 NOTE — Patient Instructions (Addendum)
[facility] Discharge Instructions for Patients Receiving Chemotherapy  Today you received the following chemotherapy agents: Panitumumab (Vectibix)  To help prevent nausea and vomiting after your treatment, we encourage you to take your nausea medication as directed.   If you develop nausea and vomiting that is not controlled by your nausea medication, call the clinic.   BELOW ARE SYMPTOMS THAT SHOULD BE REPORTED IMMEDIATELY:  *FEVER GREATER THAN 100.5 F  *CHILLS WITH OR WITHOUT FEVER  NAUSEA AND VOMITING THAT IS NOT CONTROLLED WITH YOUR NAUSEA MEDICATION  *UNUSUAL SHORTNESS OF BREATH  *UNUSUAL BRUISING OR BLEEDING  TENDERNESS IN MOUTH AND THROAT WITH OR WITHOUT PRESENCE OF ULCERS  *URINARY PROBLEMS  *BOWEL PROBLEMS  UNUSUAL RASH Items with * indicate a potential emergency and should be followed up as soon as possible.  Feel free to call the clinic should you have any questions or concerns. The clinic phone number is [phone].  Please show the [place] at check-in to the Emergency Department and triage nurse.

## 2017-12-16 ENCOUNTER — Other Ambulatory Visit: Payer: Self-pay | Admitting: Oncology

## 2017-12-19 ENCOUNTER — Other Ambulatory Visit: Payer: Self-pay | Admitting: *Deleted

## 2017-12-19 DIAGNOSIS — C184 Malignant neoplasm of transverse colon: Secondary | ICD-10-CM

## 2017-12-20 ENCOUNTER — Inpatient Hospital Stay: Payer: PRIVATE HEALTH INSURANCE

## 2017-12-20 ENCOUNTER — Inpatient Hospital Stay (HOSPITAL_BASED_OUTPATIENT_CLINIC_OR_DEPARTMENT_OTHER): Payer: PRIVATE HEALTH INSURANCE | Admitting: Oncology

## 2017-12-20 VITALS — BP 132/90 | HR 76 | Temp 98.0°F | Resp 17 | Ht 64.2 in | Wt 227.0 lb

## 2017-12-20 DIAGNOSIS — Z95828 Presence of other vascular implants and grafts: Secondary | ICD-10-CM

## 2017-12-20 DIAGNOSIS — C189 Malignant neoplasm of colon, unspecified: Secondary | ICD-10-CM

## 2017-12-20 DIAGNOSIS — C184 Malignant neoplasm of transverse colon: Secondary | ICD-10-CM | POA: Diagnosis not present

## 2017-12-20 DIAGNOSIS — Z5112 Encounter for antineoplastic immunotherapy: Secondary | ICD-10-CM | POA: Diagnosis not present

## 2017-12-20 LAB — CBC WITH DIFFERENTIAL (CANCER CENTER ONLY)
Abs Immature Granulocytes: 0.03 10*3/uL (ref 0.00–0.07)
Basophils Absolute: 0.1 10*3/uL (ref 0.0–0.1)
Basophils Relative: 1 %
EOS ABS: 0.5 10*3/uL (ref 0.0–0.5)
Eosinophils Relative: 8 %
HCT: 38.5 % (ref 36.0–46.0)
Hemoglobin: 12.8 g/dL (ref 12.0–15.0)
Immature Granulocytes: 0 %
Lymphocytes Relative: 34 %
Lymphs Abs: 2.3 10*3/uL (ref 0.7–4.0)
MCH: 28.3 pg (ref 26.0–34.0)
MCHC: 33.2 g/dL (ref 30.0–36.0)
MCV: 85.2 fL (ref 80.0–100.0)
Monocytes Absolute: 0.5 10*3/uL (ref 0.1–1.0)
Monocytes Relative: 8 %
NEUTROS PCT: 49 %
Neutro Abs: 3.3 10*3/uL (ref 1.7–7.7)
Platelet Count: 217 10*3/uL (ref 150–400)
RBC: 4.52 MIL/uL (ref 3.87–5.11)
RDW: 12.9 % (ref 11.5–15.5)
WBC Count: 6.8 10*3/uL (ref 4.0–10.5)
nRBC: 0 % (ref 0.0–0.2)

## 2017-12-20 LAB — CMP (CANCER CENTER ONLY)
ALT: 27 U/L (ref 0–44)
ANION GAP: 11 (ref 5–15)
AST: 25 U/L (ref 15–41)
Albumin: 3.5 g/dL (ref 3.5–5.0)
Alkaline Phosphatase: 109 U/L (ref 38–126)
BUN: 11 mg/dL (ref 6–20)
CALCIUM: 8.8 mg/dL — AB (ref 8.9–10.3)
CO2: 23 mmol/L (ref 22–32)
Chloride: 109 mmol/L (ref 98–111)
Creatinine: 1.02 mg/dL — ABNORMAL HIGH (ref 0.44–1.00)
GFR, Est AFR Am: >60 mL/min (ref >60)
GFR, Estimated: >60 mL/min (ref >60)
Glucose, Bld: 99 mg/dL (ref 70–99)
Potassium: 3.8 mmol/L (ref 3.5–5.1)
Sodium: 143 mmol/L (ref 135–145)
Total Bilirubin: 0.3 mg/dL (ref 0.3–1.2)
Total Protein: 6.9 g/dL (ref 6.5–8.1)

## 2017-12-20 LAB — MAGNESIUM: Magnesium: 1.2 mg/dL — CL (ref 1.7–2.4)

## 2017-12-20 MED ORDER — HEPARIN SOD (PORK) LOCK FLUSH 100 UNIT/ML IV SOLN
500.0000 [IU] | Freq: Once | INTRAVENOUS | Status: AC | PRN
  Administered 2017-12-20: 500 [IU]
  Filled 2017-12-20: qty 5

## 2017-12-20 MED ORDER — SODIUM CHLORIDE 0.9% FLUSH
10.0000 mL | INTRAVENOUS | Status: DC | PRN
  Administered 2017-12-20: 10 mL
  Filled 2017-12-20: qty 10

## 2017-12-20 MED ORDER — SODIUM CHLORIDE 0.9% FLUSH
10.0000 mL | Freq: Once | INTRAVENOUS | Status: AC
  Administered 2017-12-20: 10 mL
  Filled 2017-12-20: qty 10

## 2017-12-20 MED ORDER — SODIUM CHLORIDE 0.9 % IV SOLN
6.0000 mg/kg | Freq: Once | INTRAVENOUS | Status: AC
  Administered 2017-12-20: 600 mg via INTRAVENOUS
  Filled 2017-12-20: qty 10

## 2017-12-20 MED ORDER — SODIUM CHLORIDE 0.9 % IV SOLN
Freq: Once | INTRAVENOUS | Status: AC
  Administered 2017-12-20: 11:00:00 via INTRAVENOUS
  Filled 2017-12-20: qty 250

## 2017-12-20 NOTE — Patient Instructions (Signed)
[facility] Discharge Instructions for Patients Receiving Chemotherapy  Today you received the following chemotherapy agent: Panitumumab (Vectibix)  To help prevent nausea and vomiting after your treatment, we encourage you to take your nausea medication as directed.   If you develop nausea and vomiting that is not controlled by your nausea medication, call the clinic.   BELOW ARE SYMPTOMS THAT SHOULD BE REPORTED IMMEDIATELY:  *FEVER GREATER THAN 100.5 F  *CHILLS WITH OR WITHOUT FEVER  NAUSEA AND VOMITING THAT IS NOT CONTROLLED WITH YOUR NAUSEA MEDICATION  *UNUSUAL SHORTNESS OF BREATH  *UNUSUAL BRUISING OR BLEEDING  TENDERNESS IN MOUTH AND THROAT WITH OR WITHOUT PRESENCE OF ULCERS  *URINARY PROBLEMS  *BOWEL PROBLEMS  UNUSUAL RASH Items with * indicate a potential emergency and should be followed up as soon as possible.  Feel free to call the clinic should you have any questions or concerns. The clinic phone number is [phone].  Please show the [place] at check-in to the Emergency Department and triage nurse.

## 2017-12-20 NOTE — Progress Notes (Signed)
MD review of labs today: OK to treat with Mg+ 1.2 (patient is on oral Mg+)

## 2017-12-20 NOTE — Progress Notes (Signed)
[facility] OFFICE PROGRESS NOTE   Diagnosis: Colon cancer  INTERVAL HISTORY:   Brenda Becker returns for a scheduled visit.  She was last treated with Panitumumab on 12/20/2017.  She reports a stable skin rash.  She had a recent infection at the left toenail.  This has resolved.  She is working.  No new complaint.  Objective:  Vital signs in last 24 hours:  Blood pressure 132/90, pulse 76, temperature 98 F (36.7 C), temperature source Oral, resp. rate 17, height 5' 4.2" (1.631 m), weight 227 lb (103 kg), SpO2 100 %.    HEENT: Thrush or ulcers Resp: Lungs clear bilaterally Cardio: Regular rate and rhythm GI: No hepatomegaly, no mass, nontender Vascular: Leg edema  Skin: Mild acne type rash over the face and anterior chest, no paronychia at the hands  Portacath/PICC-without erythema  Lab Results:  Lab Results  Component Value Date   WBC 6.8 12/20/2017   HGB 12.8 12/20/2017   HCT 38.5 12/20/2017   MCV 85.2 12/20/2017   PLT 217 12/20/2017   NEUTROABS 3.3 12/20/2017    CMP  Lab Results  Component Value Date   NA 143 12/20/2017   K 3.8 12/20/2017   CL 109 12/20/2017   CO2 23 12/20/2017   GLUCOSE 99 12/20/2017   BUN 11 12/20/2017   CREATININE 1.02 (H) 12/20/2017   CALCIUM 8.8 (L) 12/20/2017   PROT 6.9 12/20/2017   ALBUMIN 3.5 12/20/2017   AST 25 12/20/2017   ALT 27 12/20/2017   ALKPHOS 109 12/20/2017   BILITOT 0.3 12/20/2017   GFRNONAA >60 12/20/2017   GFRAA >60 12/20/2017   Magnesium 1.2    Medications: I have reviewed the patient's current medications.   Assessment/Plan: 1. 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  Cycle 2 CAPOX  01/22/2015  Cycle 3 CAPOX 02/12/2015  Cycle 4 CAPOX 03/05/2015  Cycle 5 CAPOX 03/26/2015  Cycle 6 CAPOX 04/16/2015  Cycle 7 CAPOX 05/07/2015 (oxaliplatin held due to progressive neuropathy)  Cycle 8 CAPOX 05/28/2015 (oxaliplatin held secondary to neuropathy)  CTs 11/01/2015-no significant change and an 11 mm upper abdomen mesenteric lymph node with partial calcification-treated metastasis?, No other evidence of metastatic disease  CTs 01/21/2016-mild interval progression of previously identified calcified nodal tissue in the upper central abdomen. Development of small adjacent lymph nodes in the interval.  EUS guided biopsy of the calcified upper abdominal mass on 02/17/2016 confirmed metastatic adenocarcinoma  Cycle 1 FOLFIRI/Avastin 03/09/2016  Cycle 2 FOLFIRI/Avastin 03/23/2016  Cycle 3 FOLFIRI/Avastin 04/06/2016 (Neulasta added)  Cycle 4 FOLFIRI/Avastin 04/20/2016  Cycle 5 FOLFIRI/Avastin 05/04/2016  Cycle 6 FOLFIRI/Avastin 05/18/2016  CT abdomen/pelvis 05/30/2016-new retroperitoneal lymphadenopathy in the para-aortic region with the largest node measuring 1.5 cm. Partially calcified mesenteric lymph node measures 12 mm as compared to 14 mm previously. Other scattered less than 1 cmmesenteric lymph nodes remain stable.  PET scan 06/13/2016-hypermetabolic posterior mediastinal node, hypermetabolic retroperitoneal lymph nodes, stable transverse mesocolon mass, bilateral hypermetabolic inguinal nodes  EUS biopsy of posterior mediastinal lymph nodes on 07/20/2016-positive for metastatic adenocarcinoma  Clinical trial at [facility], [id number] Panitumumab, nivolumab, ipilimumab.  Restaging CTs 12/18/2017-decreased size of right lower lobe nodules, mediastinal, retroperitoneal, and mesenteric lymph nodes consistent with a response to therapy  Treatment placed on hold February 2019 due to  immunotherapy-induced hepatitis  Restaging CTs 03/12/2017-no progressive findings to  suggest new recurrent or metastatic disease in the abdomen or pelvis. No change in small calcified mesenteric and retroperitoneal abdominal lymph nodes. No evidence of metastatic disease in the chest.  Maintenance panitumumab beginning 03/29/2017, healed 05/24/2017 secondary to elevated liver enzymes-resumed 06/07/2017  Restaging CTs at [facility] 06/04/2017-no evidence of disease progression, unchanged scattered mesenteric/retroperitoneal lymph nodes  Panitumumab every 2 weeks  Restaging CTs 08/27/2017-no evidence of metastatic disease in the chest;unchanged size and appearance of scattered calcified and noncalcified mesenteric and retroperitoneal lymph nodes. Near complete resolution of previously described haziness within the central mesentery. No new evidence of recurrent or metastatic disease within the abdomen or pelvis.  Continue Panitumumab every 2 weeks 2. History of Microcytic anemia-likely iron deficiency anemia secondary to #1 and ongoing menses 3. Status post cholecystectomy 4. Family history of breast and ovarian cancer. She has seen the genetics counselor. 5. Delayed nausea following cycle 1 CAPOX. Aloxi and Emend added beginning with cycle 2. 6. Transient episodes of dyspnea following cycle 4 and cycle 5 CAPOX, likely neurotoxicity from oxaliplatin 7. Oxaliplatin neuropathy-improved 8. Mild neutropenia secondary to chemotherapy-Neulasta added beginning with cycle 3 FOLFIRI/Avastin 9. Mild mucositis following cycle 4 FOLFIRI/Avastin 10. Mammogram 05/11/2016-possible mass right breast. Right breast ultrasound 05/17/2016-right breast mass at 10:00 most likely represents a cyst, however is somewhat hypoechoic and could also represent a fibroadenoma. Ultrasound-guided aspiration of a 1 cmbenign cyst 05/22/2016. 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 12. Hypomagnesia secondary to Panitumumab. She continues magnesium oxide. 13. Elevated liver enzymes May  2019-likely secondary to resuming atorvastatin, now normal 14. History of paronychia related to Panitumumab.   Disposition: Ms. Tamez appears unchanged.  She will continue every 2-week panitumumab.  She will be scheduled for restaging CTs on 12/31/2017.  She will return for an office visit as scheduled on 01/17/2018.  15 minutes were spent with the patient today.  The majority of the time was used for counseling and coordination of care.  Betsy Coder, MD  12/20/2017  10:27 AM

## 2017-12-30 ENCOUNTER — Other Ambulatory Visit: Payer: Self-pay | Admitting: Oncology

## 2018-01-01 ENCOUNTER — Ambulatory Visit (HOSPITAL_COMMUNITY)
Admission: RE | Admit: 2018-01-01 | Discharge: 2018-01-01 | Disposition: A | Discharge status: Home / Self Care | Payer: PRIVATE HEALTH INSURANCE | Source: Ambulatory Visit | Attending: Oncology | Admitting: Oncology

## 2018-01-01 ENCOUNTER — Encounter (HOSPITAL_COMMUNITY): Payer: Self-pay | Admitting: Radiology

## 2018-01-01 DIAGNOSIS — C184 Malignant neoplasm of transverse colon: Secondary | ICD-10-CM | POA: Diagnosis not present

## 2018-01-01 MED ORDER — SODIUM CHLORIDE (PF) 0.9 % IJ SOLN
INTRAMUSCULAR | Status: AC
  Filled 2018-01-01: qty 50

## 2018-01-01 MED ORDER — IOHEXOL 300 MG/ML  SOLN
30.0000 mL | Freq: Once | INTRAMUSCULAR | Status: AC | PRN
  Administered 2018-01-01: 30 mL via ORAL

## 2018-01-01 MED ORDER — IOHEXOL 300 MG/ML  SOLN
100.0000 mL | Freq: Once | INTRAMUSCULAR | Status: AC | PRN
  Administered 2018-01-01: 100 mL via INTRAVENOUS

## 2018-01-03 ENCOUNTER — Inpatient Hospital Stay: Payer: 59 | Attending: Oncology

## 2018-01-03 ENCOUNTER — Inpatient Hospital Stay: Payer: 59

## 2018-01-03 ENCOUNTER — Telehealth: Payer: Self-pay | Admitting: *Deleted

## 2018-01-03 VITALS — BP 135/85 | HR 80 | Temp 99.2°F | Resp 16

## 2018-01-03 DIAGNOSIS — Z5112 Encounter for antineoplastic immunotherapy: Secondary | ICD-10-CM | POA: Diagnosis not present

## 2018-01-03 DIAGNOSIS — C184 Malignant neoplasm of transverse colon: Secondary | ICD-10-CM | POA: Diagnosis not present

## 2018-01-03 DIAGNOSIS — C189 Malignant neoplasm of colon, unspecified: Secondary | ICD-10-CM

## 2018-01-03 DIAGNOSIS — Z95828 Presence of other vascular implants and grafts: Secondary | ICD-10-CM

## 2018-01-03 LAB — CMP (CANCER CENTER ONLY)
ALT: 26 U/L (ref 0–44)
AST: 26 U/L (ref 15–41)
Albumin: 3.6 g/dL (ref 3.5–5.0)
Alkaline Phosphatase: 89 U/L (ref 38–126)
Anion gap: 10 (ref 5–15)
BUN: 12 mg/dL (ref 6–20)
CO2: 22 mmol/L (ref 22–32)
CREATININE: 1.09 mg/dL — AB (ref 0.44–1.00)
Calcium: 8.7 mg/dL — ABNORMAL LOW (ref 8.9–10.3)
Chloride: 110 mmol/L (ref 98–111)
GFR, Est AFR Am: >60 mL/min (ref >60)
GFR, Estimated: >60 mL/min (ref >60)
Glucose, Bld: 100 mg/dL — ABNORMAL HIGH (ref 70–99)
Potassium: 4 mmol/L (ref 3.5–5.1)
Sodium: 142 mmol/L (ref 135–145)
Total Bilirubin: 0.3 mg/dL (ref 0.3–1.2)
Total Protein: 6.9 g/dL (ref 6.5–8.1)

## 2018-01-03 LAB — MAGNESIUM: MAGNESIUM: 1.2 mg/dL — AB (ref 1.7–2.4)

## 2018-01-03 MED ORDER — SODIUM CHLORIDE 0.9% FLUSH
10.0000 mL | INTRAVENOUS | Status: DC | PRN
  Administered 2018-01-03: 10 mL
  Filled 2018-01-03: qty 10

## 2018-01-03 MED ORDER — SODIUM CHLORIDE 0.9% FLUSH
10.0000 mL | Freq: Once | INTRAVENOUS | Status: AC
  Administered 2018-01-03: 10 mL
  Filled 2018-01-03: qty 10

## 2018-01-03 MED ORDER — SODIUM CHLORIDE 0.9 % IV SOLN
Freq: Once | INTRAVENOUS | Status: AC
  Administered 2018-01-03: 14:00:00 via INTRAVENOUS
  Filled 2018-01-03: qty 250

## 2018-01-03 MED ORDER — SODIUM CHLORIDE 0.9 % IV SOLN
6.0000 mg/kg | Freq: Once | INTRAVENOUS | Status: AC
  Administered 2018-01-03: 600 mg via INTRAVENOUS
  Filled 2018-01-03: qty 20

## 2018-01-03 MED ORDER — HEPARIN SOD (PORK) LOCK FLUSH 100 UNIT/ML IV SOLN
500.0000 [IU] | Freq: Once | INTRAVENOUS | Status: AC | PRN
  Administered 2018-01-03: 500 [IU]
  Filled 2018-01-03: qty 5

## 2018-01-03 NOTE — Progress Notes (Signed)
Per Dr. [name], go ahead with tx today regardless of Mag 1.2.

## 2018-01-03 NOTE — Patient Instructions (Signed)
[place] Discharge Instructions for Patients Receiving Chemotherapy  Today you received the following chemotherapy agent: Panitumumab (Vectibix)  To help prevent nausea and vomiting after your treatment, we encourage you to take your nausea medication as directed.   If you develop nausea and vomiting that is not controlled by your nausea medication, call the clinic.   BELOW ARE SYMPTOMS THAT SHOULD BE REPORTED IMMEDIATELY:  *FEVER GREATER THAN 100.5 F  *CHILLS WITH OR WITHOUT FEVER  NAUSEA AND VOMITING THAT IS NOT CONTROLLED WITH YOUR NAUSEA MEDICATION  *UNUSUAL SHORTNESS OF BREATH  *UNUSUAL BRUISING OR BLEEDING  TENDERNESS IN MOUTH AND THROAT WITH OR WITHOUT PRESENCE OF ULCERS  *URINARY PROBLEMS  *BOWEL PROBLEMS  UNUSUAL RASH Items with * indicate a potential emergency and should be followed up as soon as possible.  Feel free to call the clinic should you have any questions or concerns. The clinic phone number is [phone].  Please show the [place] at check-in to the Emergency Department and triage nurse.

## 2018-01-03 NOTE — Telephone Encounter (Signed)
-----   Message from Ladell Pier, MD sent at 01/02/2018  3:28 PM EST ----- Please call patient, CTs show no evidence of progressive cancer, need direct comparison to most recent CTs done at [facility] 08/27/17,  ask radiology for repeat reading

## 2018-01-03 NOTE — Telephone Encounter (Signed)
Spoke with patient in the infusion area and informed her that Dr. [name] reported that her CT scan 01/01/18 shows no progressive cancer. Gave her a copy of the report. Called Tedra Coupe in radiology to request the direct comparison to [facility] CTs of 08/27/17.

## 2018-01-12 ENCOUNTER — Other Ambulatory Visit: Payer: Self-pay | Admitting: Oncology

## 2018-01-17 ENCOUNTER — Inpatient Hospital Stay (HOSPITAL_BASED_OUTPATIENT_CLINIC_OR_DEPARTMENT_OTHER): Payer: 59 | Admitting: Nurse Practitioner

## 2018-01-17 ENCOUNTER — Encounter: Payer: Self-pay | Admitting: Nurse Practitioner

## 2018-01-17 ENCOUNTER — Inpatient Hospital Stay: Payer: 59

## 2018-01-17 ENCOUNTER — Telehealth: Payer: Self-pay

## 2018-01-17 VITALS — BP 143/87 | HR 85 | Temp 98.4°F | Resp 18 | Ht 64.2 in | Wt 226.7 lb

## 2018-01-17 DIAGNOSIS — Z5112 Encounter for antineoplastic immunotherapy: Secondary | ICD-10-CM | POA: Diagnosis not present

## 2018-01-17 DIAGNOSIS — R21 Rash and other nonspecific skin eruption: Secondary | ICD-10-CM | POA: Diagnosis not present

## 2018-01-17 DIAGNOSIS — C184 Malignant neoplasm of transverse colon: Secondary | ICD-10-CM

## 2018-01-17 DIAGNOSIS — R599 Enlarged lymph nodes, unspecified: Secondary | ICD-10-CM

## 2018-01-17 DIAGNOSIS — C189 Malignant neoplasm of colon, unspecified: Secondary | ICD-10-CM

## 2018-01-17 DIAGNOSIS — Z95828 Presence of other vascular implants and grafts: Secondary | ICD-10-CM

## 2018-01-17 LAB — CMP (CANCER CENTER ONLY)
ALBUMIN: 3.7 g/dL (ref 3.5–5.0)
ALK PHOS: 99 U/L (ref 38–126)
ALT: 33 U/L (ref 0–44)
AST: 29 U/L (ref 15–41)
Anion gap: 11 (ref 5–15)
BUN: 10 mg/dL (ref 6–20)
CO2: 22 mmol/L (ref 22–32)
Calcium: 8.8 mg/dL — ABNORMAL LOW (ref 8.9–10.3)
Chloride: 110 mmol/L (ref 98–111)
Creatinine: 0.95 mg/dL (ref 0.44–1.00)
GFR, Est AFR Am: >60 mL/min (ref >60)
GFR, Estimated: >60 mL/min (ref >60)
Glucose, Bld: 112 mg/dL — ABNORMAL HIGH (ref 70–99)
Potassium: 3.7 mmol/L (ref 3.5–5.1)
Sodium: 143 mmol/L (ref 135–145)
Total Bilirubin: 0.4 mg/dL (ref 0.3–1.2)
Total Protein: 6.9 g/dL (ref 6.5–8.1)

## 2018-01-17 LAB — MAGNESIUM: Magnesium: 1.2 mg/dL — CL (ref 1.7–2.4)

## 2018-01-17 MED ORDER — SODIUM CHLORIDE 0.9 % IV SOLN
6.0000 mg/kg | Freq: Once | INTRAVENOUS | Status: AC
  Administered 2018-01-17: 600 mg via INTRAVENOUS
  Filled 2018-01-17: qty 20

## 2018-01-17 MED ORDER — HEPARIN SOD (PORK) LOCK FLUSH 100 UNIT/ML IV SOLN
500.0000 [IU] | Freq: Once | INTRAVENOUS | Status: AC | PRN
  Administered 2018-01-17: 500 [IU]
  Filled 2018-01-17: qty 5

## 2018-01-17 MED ORDER — SODIUM CHLORIDE 0.9% FLUSH
10.0000 mL | INTRAVENOUS | Status: DC | PRN
  Filled 2018-01-17: qty 10

## 2018-01-17 MED ORDER — SODIUM CHLORIDE 0.9% FLUSH
10.0000 mL | Freq: Once | INTRAVENOUS | Status: AC
  Administered 2018-01-17: 10 mL
  Filled 2018-01-17: qty 10

## 2018-01-17 MED ORDER — SODIUM CHLORIDE 0.9 % IV SOLN
Freq: Once | INTRAVENOUS | Status: AC
  Administered 2018-01-17: 11:00:00 via INTRAVENOUS
  Filled 2018-01-17: qty 250

## 2018-01-17 MED ORDER — POTASSIUM CHLORIDE CRYS ER 20 MEQ PO TBCR: EXTENDED_RELEASE_TABLET | ORAL | 2 refills | Status: DC

## 2018-01-17 NOTE — Progress Notes (Signed)
[facility] OFFICE PROGRESS NOTE   Diagnosis: Colon cancer  INTERVAL HISTORY:   Brenda Becker returns as scheduled.  She completed a cycle of Panitumumab on 01/03/2018.  Skin rash overall is stable.  She recently noted a rash on the left wrist corresponding with her watchband.  The rash has improved since she stopped wearing the watch.  She denies nausea/vomiting.  No mouth sores.  No diarrhea.  No abdominal pain.  Objective:  Vital signs in last 24 hours:  Blood pressure (!) 143/87, pulse 85, temperature 98.4 F (36.9 C), temperature source Oral, resp. rate 18, height 5' 4.2" (1.631 m), weight 226 lb 11.2 oz (102.8 kg), SpO2 100 %.    HEENT: No thrush or ulcers. Resp: Lungs clear bilaterally. Cardio: Regular rate and rhythm. GI: Abdomen soft and nontender.  No hepatomegaly. Vascular: No leg edema. Skin: Mild acne type rash over the face and trunk. Port-A-Cath without erythema.   Lab Results:  Lab Results  Component Value Date   WBC 6.8 12/20/2017   HGB 12.8 12/20/2017   HCT 38.5 12/20/2017   MCV 85.2 12/20/2017   PLT 217 12/20/2017   NEUTROABS 3.3 12/20/2017    Imaging:  No results found.  Medications: I have reviewed the patient's current medications.  Assessment/Plan: 1. 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  Cycle 2 CAPOX 01/22/2015  Cycle 3 CAPOX 02/12/2015  Cycle 4 CAPOX 03/05/2015  Cycle 5 CAPOX 03/26/2015  Cycle 6 CAPOX 04/16/2015  Cycle 7 CAPOX 05/07/2015 (oxaliplatin held due to progressive neuropathy)  Cycle 8 CAPOX 05/28/2015 (oxaliplatin held secondary to neuropathy)  CTs 11/01/2015-no significant change and an 11 mm upper abdomen  mesenteric lymph node with partial calcification-treated metastasis?, No other evidence of metastatic disease  CTs 01/21/2016-mild interval progression of previously identified calcified nodal tissue in the upper central abdomen. Development of small adjacent lymph nodes in the interval.  EUS guided biopsy of the calcified upper abdominal mass on 02/17/2016 confirmed metastatic adenocarcinoma  Cycle 1 FOLFIRI/Avastin 03/09/2016  Cycle 2 FOLFIRI/Avastin 03/23/2016  Cycle 3 FOLFIRI/Avastin 04/06/2016 (Neulasta added)  Cycle 4 FOLFIRI/Avastin 04/20/2016  Cycle 5 FOLFIRI/Avastin 05/04/2016  Cycle 6 FOLFIRI/Avastin 05/18/2016  CT abdomen/pelvis 05/30/2016-new retroperitoneal lymphadenopathy in the para-aortic region with the largest node measuring 1.5 cm. Partially calcified mesenteric lymph node measures 12 mm as compared to 14 mm previously. Other scattered less than 1 cmmesenteric lymph nodes remain stable.  PET scan 06/13/2016-hypermetabolic posterior mediastinal node, hypermetabolic retroperitoneal lymph nodes, stable transverse mesocolon mass, bilateral hypermetabolic inguinal nodes  EUS biopsy of posterior mediastinal lymph nodes on 07/20/2016-positive for metastatic adenocarcinoma  Clinical trial at [facility], [id number] Panitumumab, nivolumab, ipilimumab.  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 No change in small calcified mesenteric and retroperitoneal abdominal lymph nodes. No evidence of metastatic disease in the chest.  Maintenance panitumumab beginning 03/29/2017, healed 05/24/2017 secondary to elevated liver enzymes-resumed 06/07/2017  Restaging CTs at  [facility] 06/04/2017-no evidence of disease progression, unchanged scattered mesenteric/retroperitoneal lymph nodes  Panitumumab every 2 weeks  Restaging CTs  08/27/2017-no evidence of metastatic disease in the chest;unchanged size and appearance of scattered calcified and noncalcified mesenteric and retroperitoneal lymph nodes. Near complete resolution of previously described haziness within the central mesentery. No new evidence of recurrent or metastatic disease within the abdomen or pelvis.  Continue Panitumumab every 2 weeks  Restaging CTs 01/08/2018- stable lymph nodes within the posterior mediastinum, retroperitoneum and transverse mesocolon 2. History of Microcytic anemia-likely iron deficiency anemia secondary to #1 and ongoing menses 3. Status post cholecystectomy 4. Family history of breast and ovarian cancer. She has seen the genetics counselor. 5. Delayed nausea following cycle 1 CAPOX. Aloxi and Emend added beginning with cycle 2. 6. Transient episodes of dyspnea following cycle 4 and cycle 5 CAPOX, likely neurotoxicity from oxaliplatin 7. Oxaliplatin neuropathy-improved 8. Mild neutropenia secondary to chemotherapy-Neulasta added beginning with cycle 3 FOLFIRI/Avastin 9. Mild mucositis following cycle 4 FOLFIRI/Avastin 10. Mammogram 05/11/2016-possible mass right breast. Right breast ultrasound 05/17/2016-right breast mass at 10:00 most likely represents a cyst, however is somewhat hypoechoic and could also represent a fibroadenoma. Ultrasound-guided aspiration of a 1 cmbenign cyst 05/22/2016. 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 12. Hypomagnesia secondary to Panitumumab. She continues magnesium oxide. 13. Elevated liver enzymes May 2019-likely secondary to resuming atorvastatin, now normal 14. History of paronychia related to Panitumumab.    Disposition: Brenda Becker appears stable.  The recent restaging CTs showed no evidence of progression,  stable adenopathy within the posterior mediastinum, retroperitoneum and transverse mesocolon.  Plan to continue every 2-week Panitumumab.  She will return for lab and follow-up in 4 weeks.  She will contact the office in the interim with any problems.     [name]   01/17/2018  10:01 AM

## 2018-01-17 NOTE — Patient Instructions (Signed)
[facility] Discharge Instructions for Patients Receiving Chemotherapy  Today you received the following chemotherapy agent: Panitumumab (Vectibix)  To help prevent nausea and vomiting after your treatment, we encourage you to take your nausea medication as directed.   If you develop nausea and vomiting that is not controlled by your nausea medication, call the clinic.   BELOW ARE SYMPTOMS THAT SHOULD BE REPORTED IMMEDIATELY:  *FEVER GREATER THAN 100.5 F  *CHILLS WITH OR WITHOUT FEVER  NAUSEA AND VOMITING THAT IS NOT CONTROLLED WITH YOUR NAUSEA MEDICATION  *UNUSUAL SHORTNESS OF BREATH  *UNUSUAL BRUISING OR BLEEDING  TENDERNESS IN MOUTH AND THROAT WITH OR WITHOUT PRESENCE OF ULCERS  *URINARY PROBLEMS  *BOWEL PROBLEMS  UNUSUAL RASH Items with * indicate a potential emergency and should be followed up as soon as possible.  Feel free to call the clinic should you have any questions or concerns. The clinic phone number is [phone].  Please show the [place] at check-in to the Emergency Department and triage nurse.

## 2018-01-17 NOTE — Progress Notes (Signed)
Per Lattie Haw pt ok to treat with magnesium level 1.2

## 2018-01-17 NOTE — Progress Notes (Signed)
NP Lattie Haw aware that pt has only CMP and mag drawn today & that magnesium level is 1.2.  Ok to continue with treatment.

## 2018-01-17 NOTE — Telephone Encounter (Signed)
Critical lab TC from lab. Magnesium 1.2 Lisa aware.

## 2018-01-18 ENCOUNTER — Telehealth: Payer: Self-pay | Admitting: Nurse Practitioner

## 2018-01-18 NOTE — Telephone Encounter (Signed)
Scheduled appt per 1/16 los - pt to get an updated schedule next visit .

## 2018-01-27 ENCOUNTER — Other Ambulatory Visit: Payer: Self-pay | Admitting: Oncology

## 2018-01-31 ENCOUNTER — Inpatient Hospital Stay: Payer: 59

## 2018-01-31 ENCOUNTER — Telehealth: Payer: Self-pay | Admitting: Oncology

## 2018-01-31 VITALS — BP 134/80 | HR 69 | Temp 99.6°F | Resp 16 | Wt 228.2 lb

## 2018-01-31 DIAGNOSIS — C189 Malignant neoplasm of colon, unspecified: Secondary | ICD-10-CM

## 2018-01-31 DIAGNOSIS — Z5112 Encounter for antineoplastic immunotherapy: Secondary | ICD-10-CM | POA: Diagnosis not present

## 2018-01-31 LAB — CBC WITH DIFFERENTIAL (CANCER CENTER ONLY)
Abs Immature Granulocytes: 0.01 10*3/uL (ref 0.00–0.07)
Basophils Absolute: 0.1 10*3/uL (ref 0.0–0.1)
Basophils Relative: 1 %
Eosinophils Absolute: 0.3 10*3/uL (ref 0.0–0.5)
Eosinophils Relative: 6 %
HCT: 36.5 % (ref 36.0–46.0)
Hemoglobin: 12.1 g/dL (ref 12.0–15.0)
Immature Granulocytes: 0 %
Lymphocytes Relative: 42 %
Lymphs Abs: 2.5 10*3/uL (ref 0.7–4.0)
MCH: 28.4 pg (ref 26.0–34.0)
MCHC: 33.2 g/dL (ref 30.0–36.0)
MCV: 85.7 fL (ref 80.0–100.0)
MONO ABS: 0.4 10*3/uL (ref 0.1–1.0)
Monocytes Relative: 8 %
NEUTROS ABS: 2.5 10*3/uL (ref 1.7–7.7)
Neutrophils Relative %: 43 %
Platelet Count: 229 10*3/uL (ref 150–400)
RBC: 4.26 MIL/uL (ref 3.87–5.11)
RDW: 13.4 % (ref 11.5–15.5)
WBC Count: 5.8 10*3/uL (ref 4.0–10.5)
nRBC: 0 % (ref 0.0–0.2)

## 2018-01-31 LAB — CMP (CANCER CENTER ONLY)
ALT: 29 U/L (ref 0–44)
AST: 23 U/L (ref 15–41)
Albumin: 3.6 g/dL (ref 3.5–5.0)
Alkaline Phosphatase: 100 U/L (ref 38–126)
Anion gap: 8 (ref 5–15)
BUN: 10 mg/dL (ref 6–20)
CO2: 24 mmol/L (ref 22–32)
Calcium: 8.6 mg/dL — ABNORMAL LOW (ref 8.9–10.3)
Chloride: 110 mmol/L (ref 98–111)
Creatinine: 0.97 mg/dL (ref 0.44–1.00)
GFR, Est AFR Am: >60 mL/min (ref >60)
GFR, Estimated: >60 mL/min (ref >60)
Glucose, Bld: 111 mg/dL — ABNORMAL HIGH (ref 70–99)
POTASSIUM: 3.9 mmol/L (ref 3.5–5.1)
Sodium: 142 mmol/L (ref 135–145)
TOTAL PROTEIN: 6.7 g/dL (ref 6.5–8.1)
Total Bilirubin: 0.3 mg/dL (ref 0.3–1.2)

## 2018-01-31 LAB — MAGNESIUM: MAGNESIUM: 1.2 mg/dL — AB (ref 1.7–2.4)

## 2018-01-31 MED ORDER — SODIUM CHLORIDE 0.9% FLUSH
10.0000 mL | INTRAVENOUS | Status: DC | PRN
  Filled 2018-01-31: qty 10

## 2018-01-31 MED ORDER — SODIUM CHLORIDE 0.9 % IV SOLN
6.0000 mg/kg | Freq: Once | INTRAVENOUS | Status: AC
  Administered 2018-01-31: 600 mg via INTRAVENOUS
  Filled 2018-01-31: qty 20

## 2018-01-31 MED ORDER — HEPARIN SOD (PORK) LOCK FLUSH 100 UNIT/ML IV SOLN
500.0000 [IU] | Freq: Once | INTRAVENOUS | Status: DC | PRN
  Filled 2018-01-31: qty 5

## 2018-01-31 MED ORDER — SODIUM CHLORIDE 0.9 % IV SOLN
Freq: Once | INTRAVENOUS | Status: AC
  Administered 2018-01-31: 13:00:00 via INTRAVENOUS
  Filled 2018-01-31: qty 250

## 2018-01-31 NOTE — Patient Instructions (Signed)
[facility] Discharge Instructions for Patients Receiving Chemotherapy  Today you received the following chemotherapy agents Panitumumab (VECTIBIX).  To help prevent nausea and vomiting after your treatment, we encourage you to take your nausea medication as prescribed.   If you develop nausea and vomiting that is not controlled by your nausea medication, call the clinic.   BELOW ARE SYMPTOMS THAT SHOULD BE REPORTED IMMEDIATELY:  *FEVER GREATER THAN 100.5 F  *CHILLS WITH OR WITHOUT FEVER  NAUSEA AND VOMITING THAT IS NOT CONTROLLED WITH YOUR NAUSEA MEDICATION  *UNUSUAL SHORTNESS OF BREATH  *UNUSUAL BRUISING OR BLEEDING  TENDERNESS IN MOUTH AND THROAT WITH OR WITHOUT PRESENCE OF ULCERS  *URINARY PROBLEMS  *BOWEL PROBLEMS  UNUSUAL RASH Items with * indicate a potential emergency and should be followed up as soon as possible.  Feel free to call the clinic should you have any questions or concerns. The clinic phone number is [phone].  Please show the CHEMO ALERT CARD at check-in to the Emergency Department and triage nurse.   

## 2018-01-31 NOTE — Progress Notes (Signed)
Per Dr. [name], okay to treat patient with current Mag level, will talk to pharmacy on IV Mag treatment.

## 2018-01-31 NOTE — Telephone Encounter (Signed)
Patient came in to get an appt added per 01/16 los

## 2018-02-09 ENCOUNTER — Other Ambulatory Visit: Payer: Self-pay | Admitting: Oncology

## 2018-02-14 ENCOUNTER — Inpatient Hospital Stay: Payer: 59

## 2018-02-14 ENCOUNTER — Inpatient Hospital Stay: Payer: 59 | Attending: Oncology

## 2018-02-14 ENCOUNTER — Inpatient Hospital Stay (HOSPITAL_BASED_OUTPATIENT_CLINIC_OR_DEPARTMENT_OTHER): Payer: 59 | Admitting: Oncology

## 2018-02-14 ENCOUNTER — Telehealth: Payer: Self-pay | Admitting: Oncology

## 2018-02-14 VITALS — BP 132/81 | HR 80 | Temp 98.8°F | Resp 19 | Ht 64.2 in | Wt 228.3 lb

## 2018-02-14 DIAGNOSIS — Z5112 Encounter for antineoplastic immunotherapy: Secondary | ICD-10-CM | POA: Insufficient documentation

## 2018-02-14 DIAGNOSIS — C184 Malignant neoplasm of transverse colon: Secondary | ICD-10-CM | POA: Insufficient documentation

## 2018-02-14 DIAGNOSIS — R197 Diarrhea, unspecified: Secondary | ICD-10-CM

## 2018-02-14 DIAGNOSIS — R21 Rash and other nonspecific skin eruption: Secondary | ICD-10-CM

## 2018-02-14 DIAGNOSIS — C189 Malignant neoplasm of colon, unspecified: Secondary | ICD-10-CM

## 2018-02-14 DIAGNOSIS — Z95828 Presence of other vascular implants and grafts: Secondary | ICD-10-CM

## 2018-02-14 LAB — CMP (CANCER CENTER ONLY)
ALBUMIN: 3.6 g/dL (ref 3.5–5.0)
ALT: 31 U/L (ref 0–44)
AST: 25 U/L (ref 15–41)
Alkaline Phosphatase: 99 U/L (ref 38–126)
Anion gap: 7 (ref 5–15)
BILIRUBIN TOTAL: 0.3 mg/dL (ref 0.3–1.2)
BUN: 9 mg/dL (ref 6–20)
CO2: 25 mmol/L (ref 22–32)
Calcium: 8.6 mg/dL — ABNORMAL LOW (ref 8.9–10.3)
Chloride: 110 mmol/L (ref 98–111)
Creatinine: 1.02 mg/dL — ABNORMAL HIGH (ref 0.44–1.00)
GFR, Est AFR Am: >60 mL/min (ref >60)
GFR, Estimated: >60 mL/min (ref >60)
GLUCOSE: 98 mg/dL (ref 70–99)
Potassium: 4 mmol/L (ref 3.5–5.1)
Sodium: 142 mmol/L (ref 135–145)
TOTAL PROTEIN: 6.8 g/dL (ref 6.5–8.1)

## 2018-02-14 LAB — MAGNESIUM: Magnesium: 1.1 mg/dL — CL (ref 1.7–2.4)

## 2018-02-14 MED ORDER — SODIUM CHLORIDE 0.9% FLUSH
10.0000 mL | INTRAVENOUS | Status: DC | PRN
  Administered 2018-02-14: 10 mL
  Filled 2018-02-14: qty 10

## 2018-02-14 MED ORDER — SODIUM CHLORIDE 0.9 % IV SOLN
6.0000 mg/kg | Freq: Once | INTRAVENOUS | Status: AC
  Administered 2018-02-14: 600 mg via INTRAVENOUS
  Filled 2018-02-14: qty 20

## 2018-02-14 MED ORDER — SODIUM CHLORIDE 0.9% FLUSH
10.0000 mL | Freq: Once | INTRAVENOUS | Status: AC
  Administered 2018-02-14: 10 mL
  Filled 2018-02-14: qty 10

## 2018-02-14 MED ORDER — MAGNESIUM SULFATE 4 GM/100ML IV SOLN
4.0000 g | Freq: Once | INTRAVENOUS | Status: AC
  Administered 2018-02-14: 4 g via INTRAVENOUS
  Filled 2018-02-14: qty 100

## 2018-02-14 MED ORDER — HEPARIN SOD (PORK) LOCK FLUSH 100 UNIT/ML IV SOLN
500.0000 [IU] | Freq: Once | INTRAVENOUS | Status: AC | PRN
  Administered 2018-02-14: 500 [IU]
  Filled 2018-02-14: qty 5

## 2018-02-14 MED ORDER — SODIUM CHLORIDE 0.9 % IV SOLN
Freq: Once | INTRAVENOUS | Status: AC
  Administered 2018-02-14: 11:00:00 via INTRAVENOUS
  Filled 2018-02-14: qty 250

## 2018-02-14 NOTE — Progress Notes (Signed)
Mg = 1.1 today.  Will supplement w/ 4 g IV MgSulf.  Merleen Nicely, RN in inf made aware. Kennith Center, Pharm.D., CPP 02/14/2018@11 :11 AM

## 2018-02-14 NOTE — Progress Notes (Signed)
Received call from lab. Pt's Mg is 1.1. Dr. [name] made aware. Pt to receive IV magnesium today. Genna in pharmacy made aware as well.  Infusion nurse also notified of need for IV Magnesium

## 2018-02-14 NOTE — Patient Instructions (Addendum)
[facility] Discharge Instructions for Patients Receiving Chemotherapy  Today you received the following chemotherapy agents vectibix  To help prevent nausea and vomiting after your treatment, we encourage you to take your nausea medication as directed   If you develop nausea and vomiting that is not controlled by your nausea medication, call the clinic.   BELOW ARE SYMPTOMS THAT SHOULD BE REPORTED IMMEDIATELY:  *FEVER GREATER THAN 100.5 F  *CHILLS WITH OR WITHOUT FEVER  NAUSEA AND VOMITING THAT IS NOT CONTROLLED WITH YOUR NAUSEA MEDICATION  *UNUSUAL SHORTNESS OF BREATH  *UNUSUAL BRUISING OR BLEEDING  TENDERNESS IN MOUTH AND THROAT WITH OR WITHOUT PRESENCE OF ULCERS  *URINARY PROBLEMS  *BOWEL PROBLEMS  UNUSUAL RASH Items with * indicate a potential emergency and should be followed up as soon as possible.  Feel free to call the clinic should you have any questions or concerns. The clinic phone number is [phone].  Please show the [place] at check-in to the Emergency Department and triage nurse.   Hypomagnesemia Hypomagnesemia is a condition in which the level of magnesium in the blood is low. Magnesium is a mineral that is found in many foods. It is used in many different processes in the body. Hypomagnesemia can affect every organ in the body. In severe cases, it can cause life-threatening problems. What are the causes? This condition may be caused by:  Not getting enough magnesium in your diet.  Malnutrition.  Problems with absorbing magnesium from the intestines.  Dehydration.  Alcohol abuse.  Vomiting.  Severe or chronic diarrhea.  Some medicines, including medicines that make you urinate more (diuretics).  Certain diseases, such as kidney disease, diabetes, celiac disease, and overactive thyroid. What are the signs or symptoms? Symptoms of this condition include:  Loss of appetite.  Nausea and vomiting.  Involuntary  shaking or trembling of a body part (tremor).  Muscle weakness.  Tingling in the arms and legs.  Sudden tightening of muscles (muscle spasms).  Confusion.  Psychiatric issues, such as depression, irritability, or psychosis.  A feeling of fluttering of the heart.  Seizures. These symptoms are more severe if magnesium levels drop suddenly. How is this diagnosed? This condition may be diagnosed based on:  Your symptoms and medical history.  A physical exam.  Blood and urine tests. How is this treated? Treatment depends on the cause and the severity of the condition. It may be treated with:  A magnesium supplement. This can be taken in pill form. If the condition is severe, magnesium is usually given through an IV.  Changes to your diet. You may be directed to eat foods that have a lot of magnesium, such as green leafy vegetables, peas, beans, and nuts.  Stopping any intake of alcohol. Follow these instructions at home:      Make sure that your diet includes foods with magnesium. Foods that have a lot of magnesium in them include: ? Green leafy vegetables, such as spinach and broccoli. ? Beans and peas. ? Nuts and seeds, such as almonds and sunflower seeds. ? Whole grains, such as whole grain bread and fortified cereals.  Take magnesium supplements if your health care provider tells you to do that. Take them as directed.  Take over-the-counter and prescription medicines only as told by your health care provider.  Have your magnesium levels monitored as told by your health care provider.  When you are active, drink fluids that contain electrolytes.  Avoid drinking alcohol.  Keep all follow-up  visits as told by your health care provider. This is important. Contact a health care provider if:  You get worse instead of better.  Your symptoms return. Get help right away if you:  Develop severe muscle weakness.  Have trouble breathing.  Feel that your heart is  racing. Summary  Hypomagnesemia is a condition in which the level of magnesium in the blood is low.  Hypomagnesemia can affect every organ in the body.  Treatment may include eating more foods that contain magnesium, taking magnesium supplements, and not drinking alcohol.  Have your magnesium levels monitored as told by your health care provider. This information is not intended to replace advice given to you by your health care provider. Make sure you discuss any questions you have with your health care provider. Document Released: 09/14/2004 Document Revised: 11/20/2016 Document Reviewed: 11/20/2016 Elsevier Interactive Patient Education  2019 [employer].

## 2018-02-14 NOTE — Telephone Encounter (Signed)
Scheduled appt per 2/13 los - pt to get an updated schedule next visit.   

## 2018-02-14 NOTE — Progress Notes (Signed)
[facility] OFFICE PROGRESS NOTE   Diagnosis: Colon cancer  INTERVAL HISTORY:   Brenda Becker presents scheduled.  She was last treated with Panitumumab 01/31/2018.  She reports mild intermittent diarrhea.  A stable skin rash.  She is taking potassium and magnesium.  Objective:  Vital signs in last 24 hours:  Blood pressure 132/81, pulse 80, temperature 98.8 F (37.1 C), temperature source Oral, resp. rate 19, height 5' 4.2" (1.631 m), weight 228 lb 4.8 oz (103.6 kg), SpO2 99 %.    HEENT: No thrush or ulcers Resp: Lungs clear bilaterally Cardio: Regular rate and rhythm GI: No hepatosplenomegaly, no mass, nontender Vascular: No leg edema  Skin: Acne type rash over the face trunk, and extremities  Portacath/PICC-without erythema  Lab Results:  Lab Results  Component Value Date   WBC 5.8 01/31/2018   HGB 12.1 01/31/2018   HCT 36.5 01/31/2018   MCV 85.7 01/31/2018   PLT 229 01/31/2018   NEUTROABS 2.5 01/31/2018    CMP  Lab Results  Component Value Date   NA 142 01/31/2018   K 3.9 01/31/2018   CL 110 01/31/2018   CO2 24 01/31/2018   GLUCOSE 111 (H) 01/31/2018   BUN 10 01/31/2018   CREATININE 0.97 01/31/2018   CALCIUM 8.6 (L) 01/31/2018   PROT 6.7 01/31/2018   ALBUMIN 3.6 01/31/2018   AST 23 01/31/2018   ALT 29 01/31/2018   ALKPHOS 100 01/31/2018   BILITOT 0.3 01/31/2018   GFRNONAA >60 01/31/2018   GFRAA >60 01/31/2018    Lab Results  Component Value Date   CEA1 <1.00 11/22/2017     Medications: I have reviewed the patient's current medications.   Assessment/Plan: 1. 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  Cycle 2 CAPOX 01/22/2015  Cycle  3 CAPOX 02/12/2015  Cycle 4 CAPOX 03/05/2015  Cycle 5 CAPOX 03/26/2015  Cycle 6 CAPOX 04/16/2015  Cycle 7 CAPOX 05/07/2015 (oxaliplatin held due to progressive neuropathy)  Cycle 8 CAPOX 05/28/2015 (oxaliplatin held secondary to neuropathy)  CTs 11/01/2015-no significant change and an 11 mm upper abdomen mesenteric lymph node with partial calcification-treated metastasis?, No other evidence of metastatic disease  CTs 01/21/2016-mild interval progression of previously identified calcified nodal tissue in the upper central abdomen. Development of small adjacent lymph nodes in the interval.  EUS guided biopsy of the calcified upper abdominal mass on 02/17/2016 confirmed metastatic adenocarcinoma  Cycle 1 FOLFIRI/Avastin 03/09/2016  Cycle 2 FOLFIRI/Avastin 03/23/2016  Cycle 3 FOLFIRI/Avastin 04/06/2016 (Neulasta added)  Cycle 4 FOLFIRI/Avastin 04/20/2016  Cycle 5 FOLFIRI/Avastin 05/04/2016  Cycle 6 FOLFIRI/Avastin 05/18/2016  CT abdomen/pelvis 05/30/2016-new retroperitoneal lymphadenopathy in the para-aortic region with the largest node measuring 1.5 cm. Partially calcified mesenteric lymph node measures 12 mm as compared to 14 mm previously. Other scattered less than 1 cmmesenteric lymph nodes remain stable.  PET scan 06/13/2016-hypermetabolic posterior mediastinal node, hypermetabolic retroperitoneal lymph nodes, stable transverse mesocolon mass, bilateral hypermetabolic inguinal nodes  EUS biopsy of posterior mediastinal lymph nodes on 07/20/2016-positive for metastatic adenocarcinoma  Clinical trial at [facility], [id number] Panitumumab, nivolumab, ipilimumab.  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 No change in small calcified mesenteric and retroperitoneal abdominal lymph nodes. No evidence of metastatic disease in the chest.  Maintenance panitumumab beginning 03/29/2017, healed 05/24/2017 secondary to elevated liver enzymes-resumed 06/07/2017  [facility] at [facility] 06/04/2017-no evidence of disease progression, unchanged scattered mesenteric/retroperitoneal lymph nodes  Panitumumab every 2 weeks  Restaging CTs 08/27/2017-no evidence of metastatic disease in the chest;unchanged size and appearance of scattered calcified and noncalcified mesenteric and retroperitoneal lymph nodes. Near complete resolution of previously described haziness within the central mesentery. No new evidence of recurrent or metastatic disease within the abdomen or pelvis.  Continue Panitumumab every 2 weeks  Restaging CTs 01/08/2018- stable lymph nodes within the posterior mediastinum, retroperitoneum and transverse mesocolon 2. History of Microcytic anemia-likely iron deficiency anemia secondary to #1 and ongoing menses 3. Status post cholecystectomy 4. Family history of breast and ovarian cancer. She has seen the genetics counselor. 5. Delayed nausea following cycle 1 CAPOX. Aloxi and Emend added beginning with cycle 2. 6. Transient episodes of dyspnea following cycle 4 and cycle 5 CAPOX, likely neurotoxicity from oxaliplatin 7. Oxaliplatin neuropathy-improved 8. Mild neutropenia secondary to chemotherapy-Neulasta added beginning with cycle 3 FOLFIRI/Avastin 9. Mild mucositis following cycle 4 FOLFIRI/Avastin 10. Mammogram 05/11/2016-possible mass right breast. Right breast ultrasound 05/17/2016-right breast mass at 10:00 most likely represents a cyst, however is somewhat hypoechoic and could also represent a fibroadenoma. Ultrasound-guided aspiration of a 1 cmbenign cyst 05/22/2016. 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 12. Hypomagnesia secondary to Panitumumab. She continues magnesium oxide. 13. Elevated liver enzymes May 2019-likely secondary to resuming atorvastatin, now normal 14. History of paronychia related to Panitumumab.    Disposition: Brenda Becker appears unchanged.  She continues every 2-week panitumumab.  We will follow-up on the potassium and magnesium levels from today.  She will return for Panitumumab in 2 weeks in 4 weeks.  She will be scheduled for 4-week office visit.  We will deliver IV magnesium if the magnesium returns significantly lower.  Betsy Coder, MD  02/14/2018  10:28 AM

## 2018-02-20 DIAGNOSIS — E6609 Other obesity due to excess calories: Secondary | ICD-10-CM | POA: Diagnosis not present

## 2018-02-20 DIAGNOSIS — C184 Malignant neoplasm of transverse colon: Secondary | ICD-10-CM | POA: Diagnosis not present

## 2018-02-20 DIAGNOSIS — E782 Mixed hyperlipidemia: Secondary | ICD-10-CM | POA: Diagnosis not present

## 2018-02-20 DIAGNOSIS — K219 Gastro-esophageal reflux disease without esophagitis: Secondary | ICD-10-CM | POA: Diagnosis not present

## 2018-02-24 ENCOUNTER — Other Ambulatory Visit: Payer: Self-pay | Admitting: Oncology

## 2018-02-28 ENCOUNTER — Inpatient Hospital Stay: Payer: 59

## 2018-02-28 VITALS — BP 127/87 | HR 78 | Temp 99.5°F | Resp 18

## 2018-02-28 DIAGNOSIS — Z95828 Presence of other vascular implants and grafts: Secondary | ICD-10-CM

## 2018-02-28 DIAGNOSIS — Z5112 Encounter for antineoplastic immunotherapy: Secondary | ICD-10-CM | POA: Diagnosis not present

## 2018-02-28 DIAGNOSIS — C189 Malignant neoplasm of colon, unspecified: Secondary | ICD-10-CM

## 2018-02-28 DIAGNOSIS — C184 Malignant neoplasm of transverse colon: Secondary | ICD-10-CM

## 2018-02-28 LAB — CMP (CANCER CENTER ONLY)
ALBUMIN: 3.5 g/dL (ref 3.5–5.0)
ALT: 28 U/L (ref 0–44)
AST: 23 U/L (ref 15–41)
Alkaline Phosphatase: 93 U/L (ref 38–126)
Anion gap: 12 (ref 5–15)
BUN: 8 mg/dL (ref 6–20)
CO2: 20 mmol/L — ABNORMAL LOW (ref 22–32)
Calcium: 8.4 mg/dL — ABNORMAL LOW (ref 8.9–10.3)
Chloride: 110 mmol/L (ref 98–111)
Creatinine: 0.99 mg/dL (ref 0.44–1.00)
GFR, Est AFR Am: >60 mL/min (ref >60)
GFR, Estimated: >60 mL/min (ref >60)
GLUCOSE: 127 mg/dL — AB (ref 70–99)
POTASSIUM: 3.6 mmol/L (ref 3.5–5.1)
Sodium: 142 mmol/L (ref 135–145)
Total Bilirubin: 0.4 mg/dL (ref 0.3–1.2)
Total Protein: 6.8 g/dL (ref 6.5–8.1)

## 2018-02-28 LAB — MAGNESIUM: Magnesium: 1.1 mg/dL — CL (ref 1.7–2.4)

## 2018-02-28 MED ORDER — HEPARIN SOD (PORK) LOCK FLUSH 100 UNIT/ML IV SOLN
500.0000 [IU] | Freq: Once | INTRAVENOUS | Status: AC | PRN
  Administered 2018-02-28: 500 [IU]
  Filled 2018-02-28: qty 5

## 2018-02-28 MED ORDER — SODIUM CHLORIDE 0.9% FLUSH
10.0000 mL | Freq: Once | INTRAVENOUS | Status: AC
  Administered 2018-02-28: 10 mL
  Filled 2018-02-28: qty 10

## 2018-02-28 MED ORDER — MAGNESIUM SULFATE 4 GM/100ML IV SOLN
4.0000 g | Freq: Once | INTRAVENOUS | Status: AC
  Administered 2018-02-28: 4 g via INTRAVENOUS
  Filled 2018-02-28: qty 100

## 2018-02-28 MED ORDER — SODIUM CHLORIDE 0.9 % IV SOLN
6.0000 mg/kg | Freq: Once | INTRAVENOUS | Status: AC
  Administered 2018-02-28: 600 mg via INTRAVENOUS
  Filled 2018-02-28: qty 20

## 2018-02-28 MED ORDER — SODIUM CHLORIDE 0.9% FLUSH
10.0000 mL | INTRAVENOUS | Status: DC | PRN
  Administered 2018-02-28: 10 mL
  Filled 2018-02-28: qty 10

## 2018-02-28 MED ORDER — SODIUM CHLORIDE 0.9 % IV SOLN
Freq: Once | INTRAVENOUS | Status: AC
  Administered 2018-02-28: 10:00:00 via INTRAVENOUS
  Filled 2018-02-28: qty 250

## 2018-02-28 NOTE — Patient Instructions (Addendum)
[facility] Discharge Instructions for Patients Receiving Chemotherapy  Today you received the following chemotherapy agents Vectibix  To help prevent nausea and vomiting after your treatment, we encourage you to take your nausea medication as directed.  If you develop nausea and vomiting that is not controlled by your nausea medication, call the clinic.   BELOW ARE SYMPTOMS THAT SHOULD BE REPORTED IMMEDIATELY:  *FEVER GREATER THAN 100.5 F  *CHILLS WITH OR WITHOUT FEVER  NAUSEA AND VOMITING THAT IS NOT CONTROLLED WITH YOUR NAUSEA MEDICATION  *UNUSUAL SHORTNESS OF BREATH  *UNUSUAL BRUISING OR BLEEDING  TENDERNESS IN MOUTH AND THROAT WITH OR WITHOUT PRESENCE OF ULCERS  *URINARY PROBLEMS  *BOWEL PROBLEMS  UNUSUAL RASH Items with * indicate a potential emergency and should be followed up as soon as possible.  Feel free to call the clinic should you have any questions or concerns. The clinic phone number is [phone].  Please show the CHEMO ALERT CARD at check-in to the Emergency Department and triage nurse.   Hypomagnesemia Hypomagnesemia is a condition in which the level of magnesium in the blood is low. Magnesium is a mineral that is found in many foods. It is used in many different processes in the body. Hypomagnesemia can affect every organ in the body. In severe cases, it can cause life-threatening problems. What are the causes? This condition may be caused by:  Not getting enough magnesium in your diet.  Malnutrition.  Problems with absorbing magnesium from the intestines.  Dehydration.  Alcohol abuse.  Vomiting.  Severe or chronic diarrhea.  Some medicines, including medicines that make you urinate more (diuretics).  Certain diseases, such as kidney disease, diabetes, celiac disease, and overactive thyroid. What are the signs or symptoms? Symptoms of this condition include:  Loss of appetite.  Nausea and vomiting.  Involuntary  shaking or trembling of a body part (tremor).  Muscle weakness.  Tingling in the arms and legs.  Sudden tightening of muscles (muscle spasms).  Confusion.  Psychiatric issues, such as depression, irritability, or psychosis.  A feeling of fluttering of the heart.  Seizures. These symptoms are more severe if magnesium levels drop suddenly. How is this diagnosed? This condition may be diagnosed based on:  Your symptoms and medical history.  A physical exam.  Blood and urine tests. How is this treated? Treatment depends on the cause and the severity of the condition. It may be treated with:  A magnesium supplement. This can be taken in pill form. If the condition is severe, magnesium is usually given through an IV.  Changes to your diet. You may be directed to eat foods that have a lot of magnesium, such as green leafy vegetables, peas, beans, and nuts.  Stopping any intake of alcohol. Follow these instructions at home:      Make sure that your diet includes foods with magnesium. Foods that have a lot of magnesium in them include: ? Green leafy vegetables, such as spinach and broccoli. ? Beans and peas. ? Nuts and seeds, such as almonds and sunflower seeds. ? Whole grains, such as whole grain bread and fortified cereals.  Take magnesium supplements if your health care provider tells you to do that. Take them as directed.  Take over-the-counter and prescription medicines only as told by your health care provider.  Have your magnesium levels monitored as told by your health care provider.  When you are active, drink fluids that contain electrolytes.  Avoid drinking alcohol.  Keep all follow-up visits   as told by your health care provider. This is important. Contact a health care provider if:  You get worse instead of better.  Your symptoms return. Get help right away if you:  Develop severe muscle weakness.  Have trouble breathing.  Feel that your heart is  racing. Summary  Hypomagnesemia is a condition in which the level of magnesium in the blood is low.  Hypomagnesemia can affect every organ in the body.  Treatment may include eating more foods that contain magnesium, taking magnesium supplements, and not drinking alcohol.  Have your magnesium levels monitored as told by your health care provider. This information is not intended to replace advice given to you by your health care provider. Make sure you discuss any questions you have with your health care provider. Document Released: 09/14/2004 Document Revised: 11/20/2016 Document Reviewed: 11/20/2016 Elsevier Interactive Patient Education  2019 [employer].

## 2018-02-28 NOTE — Progress Notes (Signed)
Received verbal order for pharmacy to dose magnesium for patient today. Mg level is 1.1. Patient confirmed she is taking her oral magnesium. Will give magnesium 4 grams IV today.   Demetrius Charity, PharmD, [facility] Pharmacist Pharmacy Phone: [phone] 02/28/2018

## 2018-02-28 NOTE — Progress Notes (Signed)
Dr. [name] aware of Magnesium 1.1. OK to treat today and will have pharmacy dose IV magnesium as well.

## 2018-03-02 IMAGING — CT CT ABD-PELV W/ CM
2 of 5 series · 16 of 46 positions shown, 18 images · IV contrast (ISOVUE 300)
Comparison: 01/21/2016

CLINICAL DATA: Followup transverse colon carcinoma. Ongoing
chemotherapy.

EXAM:
CT ABDOMEN AND PELVIS WITH CONTRAST
TECHNIQUE: Multidetector CT imaging of the abdomen and pelvis was performed
using the standard protocol following bolus administration of
intravenous contrast.
CONTRAST:  100mL ZAU13P-GZZ IOPAMIDOL (ZAU13P-GZZ) INJECTION 61%

[Series 2: axial st · axial · 0.84mm/px · z∈[+1070,+1480]mm · 13 of 96 slices shown, 15 images]
[im 7/96  soft-tissue]
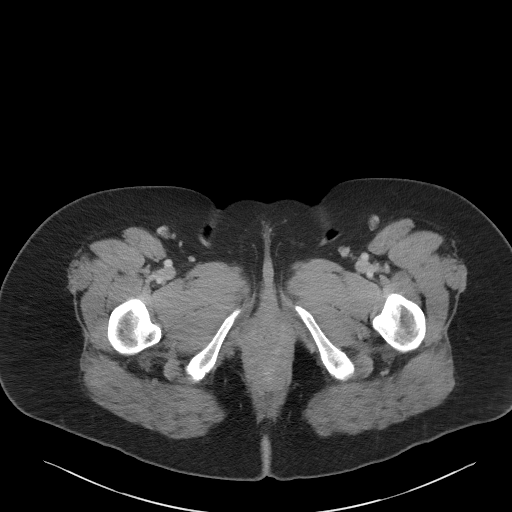
[im 7/96  bone]
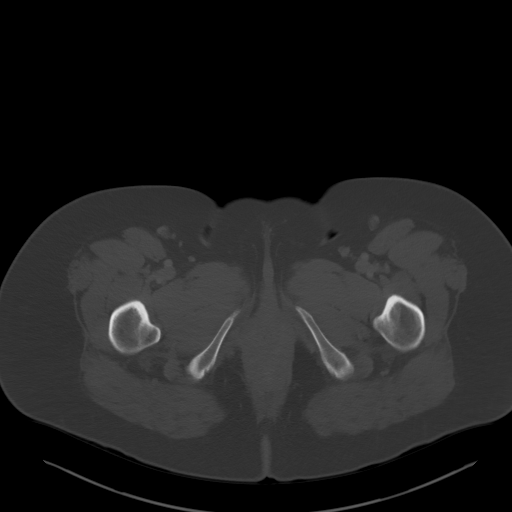
[im 13/96  soft-tissue]
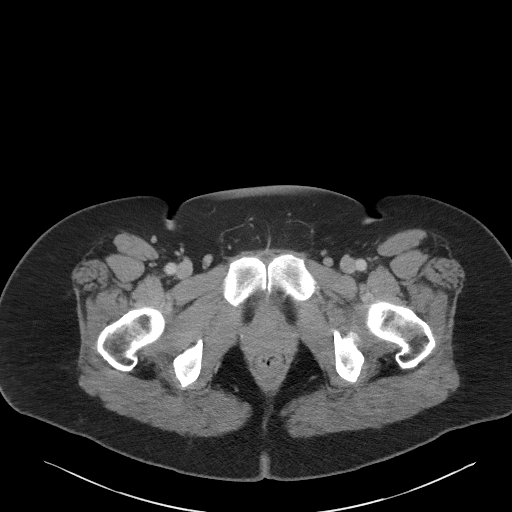
[im 20/96  soft-tissue]
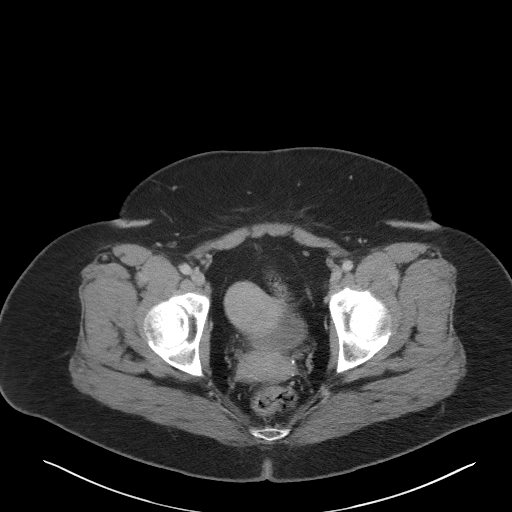
[im 26/96  soft-tissue]
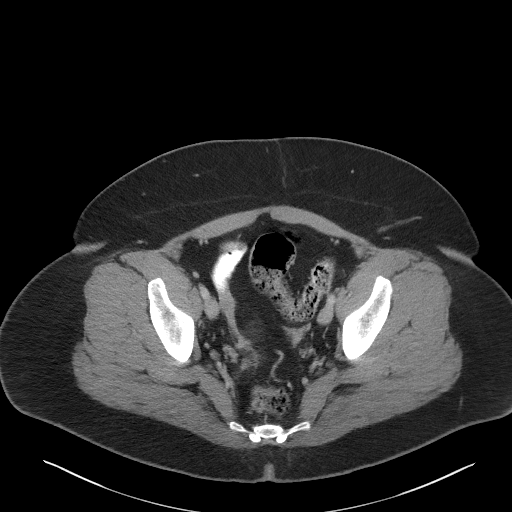
[im 32/96  soft-tissue]
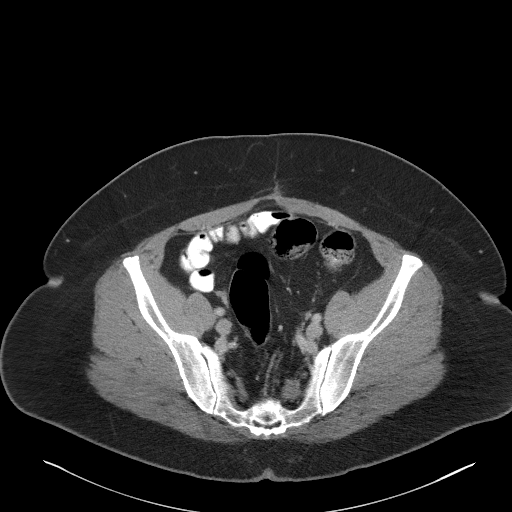
[im 39/96  soft-tissue]
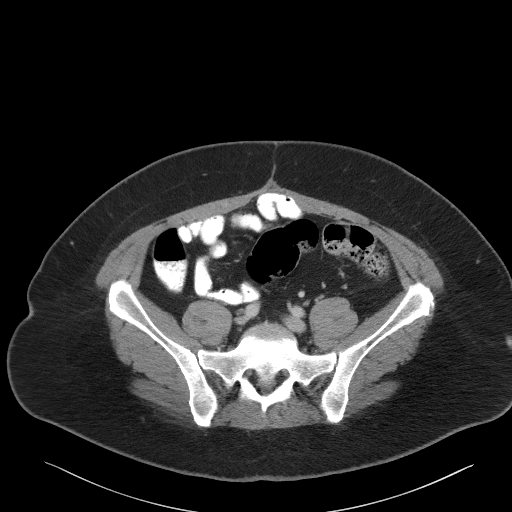
[im 51/96  soft-tissue]
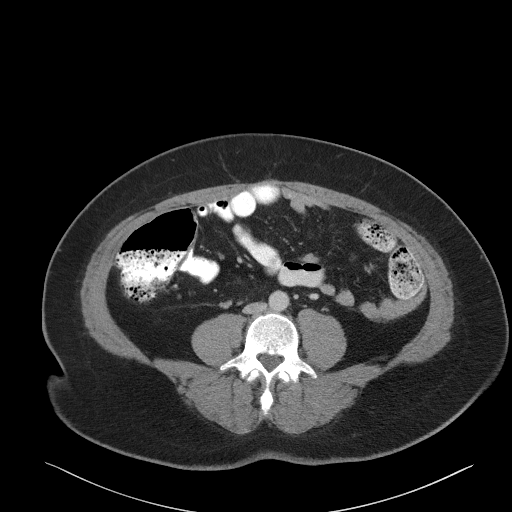
[im 58/96  soft-tissue]
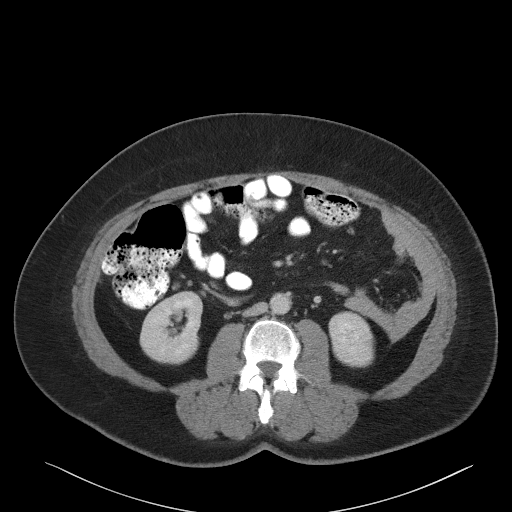
[im 64/96  soft-tissue]
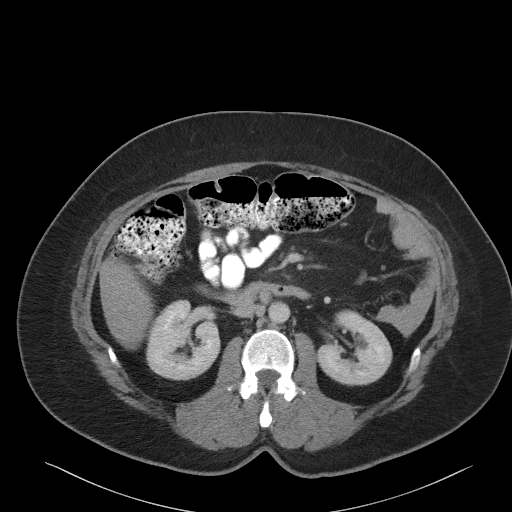
[im 64/96  bone]
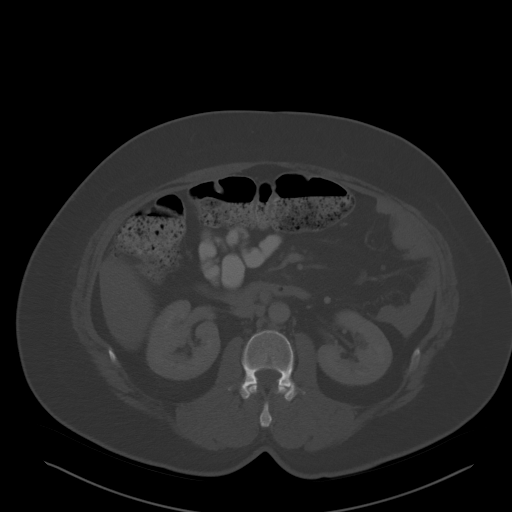
[im 70/96  soft-tissue]
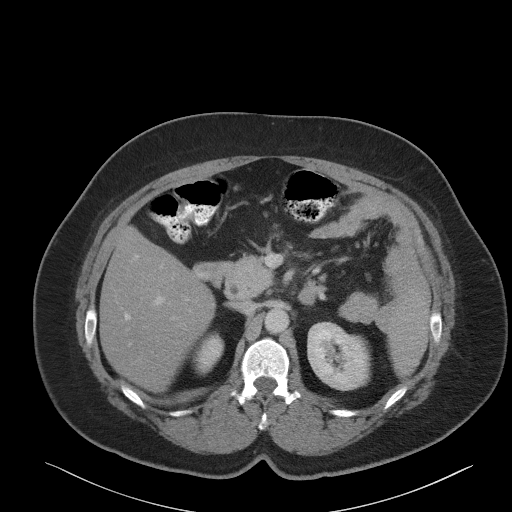
[im 77/96  soft-tissue]
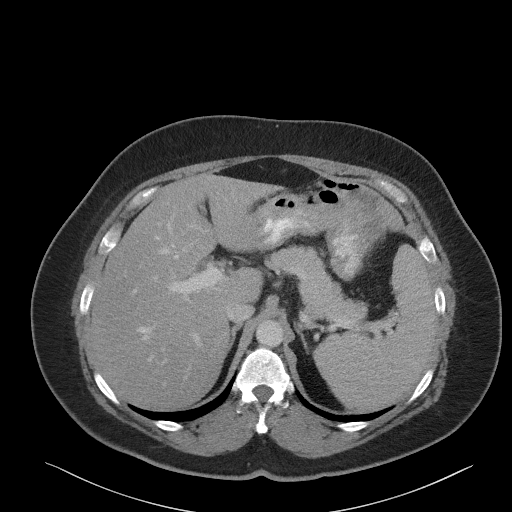
[im 83/96  soft-tissue]
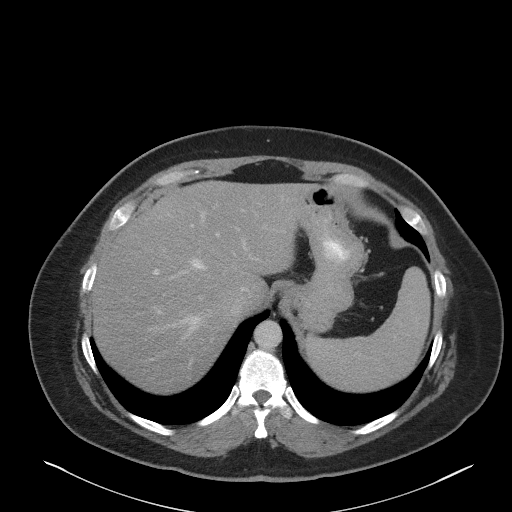
[im 89/96  soft-tissue]
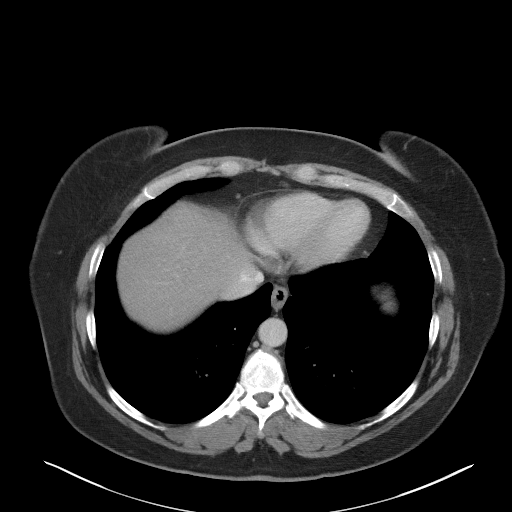

[Series 4: coronal st · coronal · 0.83mm/px · 3 of 81 slices shown]
[im 27/81  soft-tissue]
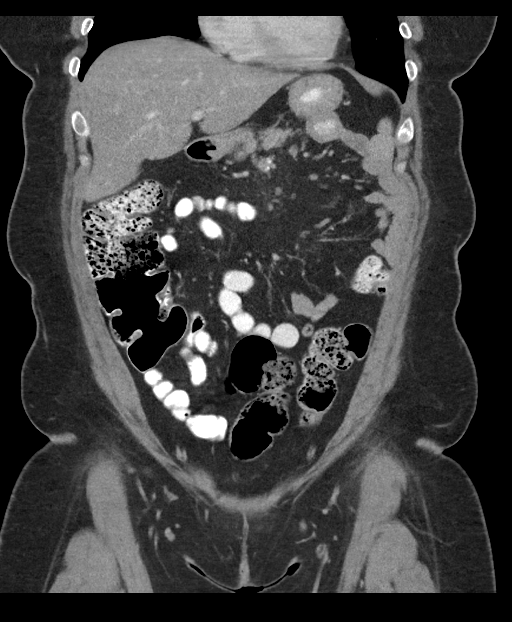
[im 36/81  soft-tissue]
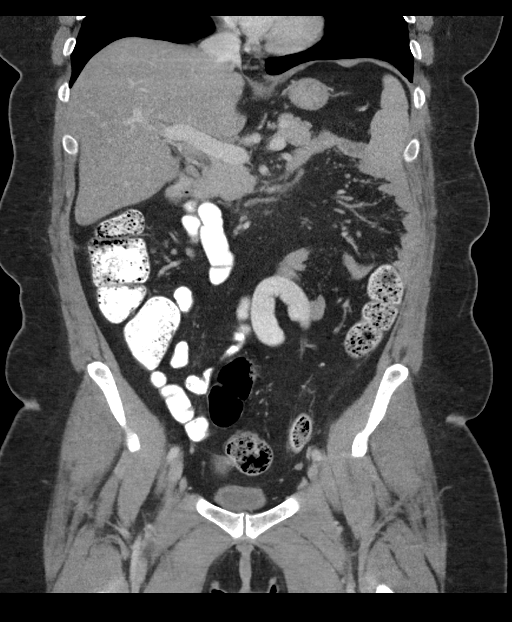
[im 45/81  soft-tissue]
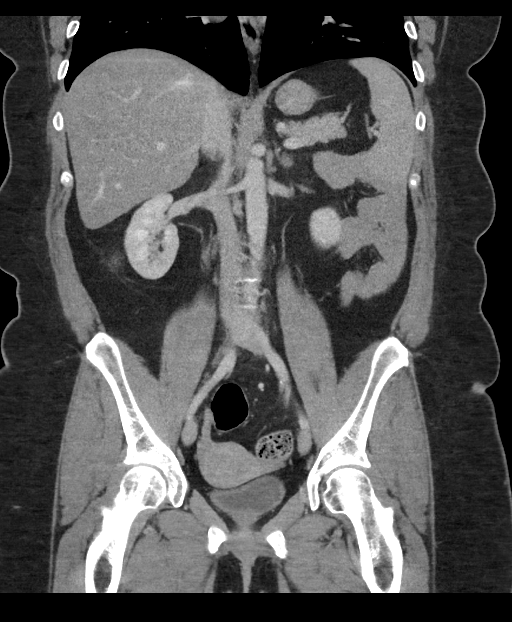

[16 of 46 positions shown; findings below may reference images not displayed]

FINDINGS: Lower Chest: No acute findings.

Hepatobiliary: No masses identified. Stable hepatic steatosis. Prior
cholecystectomy noted. No evidence of biliary dilatation.

Pancreas:  No mass or inflammatory changes.

Spleen: Within normal limits in size and appearance.

Adrenals/Urinary Tract: Normal adrenal glands. Ill-defined somewhat
linear or wedge-shaped areas of decreased attenuation are seen in
the mid and lower pole the right kidney and lower pole left kidney
which are new since previous study. Differential diagnosis includes
pyelonephritis, renal infarcts, or less likely neoplasm. No evidence
of hydronephrosis.

Stomach/Bowel: No evidence of obstruction, inflammatory process or
abnormal fluid collections.

Vascular/Lymphatic: New retroperitoneal lymphadenopathy is seen in
the paraaortic region, with largest lymph node measuring 1.5 cm on
image [DATE]. Partially calcified mesenteric lymph node measures 12 mm
on image [DATE] compared to 14 mm previously. Other scattered less
than 1 cm mesenteric lymph nodes remain stable. No abdominal aortic
aneurysm.

Reproductive: Stable 3.6 cm fibroid in uterine fundus. Adnexal
regions are unremarkable.

Other:  None.

Musculoskeletal:  No suspicious bone lesions identified.
IMPRESSION: New mild abdominal retroperitoneal lymphadenopathy, consistent with
metastatic disease.

Stable mild mesenteric lymphadenopathy.

New ill-defined linear or wedge-shaped areas of decreased
attenuation in both kidneys. Differential diagnosis includes
pyelonephritis, renal infarcts, or less likely neoplasm. Suggest
correlation with urinalysis, and continued attention on follow-up
CT.

Stable hepatic steatosis.  No evidence of liver metastases.

Stable small uterine fibroid.

## 2018-03-10 ENCOUNTER — Other Ambulatory Visit: Payer: Self-pay | Admitting: Oncology

## 2018-03-12 ENCOUNTER — Telehealth: Payer: Self-pay | Admitting: Oncology

## 2018-03-12 NOTE — Telephone Encounter (Signed)
Faxed office note to [facility]

## 2018-03-13 ENCOUNTER — Telehealth: Payer: Self-pay | Admitting: Oncology

## 2018-03-13 NOTE — Telephone Encounter (Signed)
Faxed medical records to [facility] attn: Cleta Alberts, RN, Release [phone]

## 2018-03-14 ENCOUNTER — Inpatient Hospital Stay: Payer: 59

## 2018-03-14 ENCOUNTER — Other Ambulatory Visit: Payer: Self-pay | Admitting: *Deleted

## 2018-03-14 ENCOUNTER — Inpatient Hospital Stay (HOSPITAL_BASED_OUTPATIENT_CLINIC_OR_DEPARTMENT_OTHER): Payer: 59 | Admitting: Oncology

## 2018-03-14 ENCOUNTER — Inpatient Hospital Stay: Payer: 59 | Attending: Oncology

## 2018-03-14 VITALS — BP 136/87 | HR 74 | Temp 98.8°F | Resp 16

## 2018-03-14 VITALS — BP 137/82 | HR 86 | Temp 98.3°F | Resp 18 | Ht 64.2 in | Wt 229.6 lb

## 2018-03-14 DIAGNOSIS — R21 Rash and other nonspecific skin eruption: Secondary | ICD-10-CM | POA: Diagnosis not present

## 2018-03-14 DIAGNOSIS — C189 Malignant neoplasm of colon, unspecified: Secondary | ICD-10-CM

## 2018-03-14 DIAGNOSIS — C184 Malignant neoplasm of transverse colon: Secondary | ICD-10-CM

## 2018-03-14 DIAGNOSIS — R197 Diarrhea, unspecified: Secondary | ICD-10-CM

## 2018-03-14 DIAGNOSIS — Z95828 Presence of other vascular implants and grafts: Secondary | ICD-10-CM

## 2018-03-14 DIAGNOSIS — Z5112 Encounter for antineoplastic immunotherapy: Secondary | ICD-10-CM | POA: Diagnosis present

## 2018-03-14 LAB — CBC WITH DIFFERENTIAL/PLATELET
Abs Immature Granulocytes: 0.02 10*3/uL (ref 0.00–0.07)
Basophils Absolute: 0.1 10*3/uL (ref 0.0–0.1)
Basophils Relative: 1 %
Eosinophils Absolute: 0.4 10*3/uL (ref 0.0–0.5)
Eosinophils Relative: 6 %
HCT: 37.4 % (ref 36.0–46.0)
Hemoglobin: 12.1 g/dL (ref 12.0–15.0)
Immature Granulocytes: 0 %
Lymphocytes Relative: 38 %
Lymphs Abs: 2.4 10*3/uL (ref 0.7–4.0)
MCH: 28.1 pg (ref 26.0–34.0)
MCHC: 32.4 g/dL (ref 30.0–36.0)
MCV: 86.8 fL (ref 80.0–100.0)
Monocytes Absolute: 0.5 10*3/uL (ref 0.1–1.0)
Monocytes Relative: 9 %
NEUTROS PCT: 46 %
Neutro Abs: 2.9 10*3/uL (ref 1.7–7.7)
Platelets: 225 10*3/uL (ref 150–400)
RBC: 4.31 MIL/uL (ref 3.87–5.11)
RDW: 13.2 % (ref 11.5–15.5)
WBC: 6.3 10*3/uL (ref 4.0–10.5)
nRBC: 0 % (ref 0.0–0.2)

## 2018-03-14 LAB — COMPREHENSIVE METABOLIC PANEL
ALT: 32 U/L (ref 0–44)
AST: 28 U/L (ref 15–41)
Albumin: 3.4 g/dL — ABNORMAL LOW (ref 3.5–5.0)
Alkaline Phosphatase: 97 U/L (ref 38–126)
Anion gap: 10 (ref 5–15)
BUN: 7 mg/dL (ref 6–20)
CO2: 22 mmol/L (ref 22–32)
Calcium: 8.1 mg/dL — ABNORMAL LOW (ref 8.9–10.3)
Chloride: 111 mmol/L (ref 98–111)
Creatinine, Ser: 0.93 mg/dL (ref 0.44–1.00)
GFR calc Af Amer: >60 mL/min (ref >60)
GFR calc non Af Amer: >60 mL/min (ref >60)
Glucose, Bld: 96 mg/dL (ref 70–99)
POTASSIUM: 3.8 mmol/L (ref 3.5–5.1)
SODIUM: 143 mmol/L (ref 135–145)
Total Bilirubin: 0.3 mg/dL (ref 0.3–1.2)
Total Protein: 6.6 g/dL (ref 6.5–8.1)

## 2018-03-14 LAB — MAGNESIUM: Magnesium: 1.2 mg/dL — CL (ref 1.7–2.4)

## 2018-03-14 MED ORDER — SODIUM CHLORIDE 0.9 % IV SOLN
6.0000 mg/kg | Freq: Once | INTRAVENOUS | Status: AC
  Administered 2018-03-14: 600 mg via INTRAVENOUS
  Filled 2018-03-14: qty 20

## 2018-03-14 MED ORDER — HEPARIN SOD (PORK) LOCK FLUSH 100 UNIT/ML IV SOLN
500.0000 [IU] | Freq: Once | INTRAVENOUS | Status: AC | PRN
  Administered 2018-03-14: 500 [IU]
  Filled 2018-03-14: qty 5

## 2018-03-14 MED ORDER — SODIUM CHLORIDE 0.9 % IV SOLN
Freq: Once | INTRAVENOUS | Status: AC
  Administered 2018-03-14: 11:00:00 via INTRAVENOUS
  Filled 2018-03-14: qty 250

## 2018-03-14 MED ORDER — SODIUM CHLORIDE 0.9% FLUSH
10.0000 mL | Freq: Once | INTRAVENOUS | Status: AC
  Administered 2018-03-14: 10 mL
  Filled 2018-03-14: qty 10

## 2018-03-14 MED ORDER — SODIUM CHLORIDE 0.9 % IV SOLN
Freq: Once | INTRAVENOUS | Status: AC
  Administered 2018-03-14: 12:00:00 via INTRAVENOUS
  Filled 2018-03-14: qty 250

## 2018-03-14 MED ORDER — SODIUM CHLORIDE 0.9% FLUSH
10.0000 mL | INTRAVENOUS | Status: DC | PRN
  Administered 2018-03-14: 10 mL
  Filled 2018-03-14: qty 10

## 2018-03-14 NOTE — Progress Notes (Signed)
MD reviewed Mg+ result: OK to treat today. Will have pharmacy dose IV magnesium.

## 2018-03-14 NOTE — Patient Instructions (Signed)
[facility] Discharge Instructions for Patients Receiving Chemotherapy  Today you received the following chemotherapy agents Vectibix  To help prevent nausea and vomiting after your treatment, we encourage you to take your nausea medication as directed.  If you develop nausea and vomiting that is not controlled by your nausea medication, call the clinic.   BELOW ARE SYMPTOMS THAT SHOULD BE REPORTED IMMEDIATELY:  *FEVER GREATER THAN 100.5 F  *CHILLS WITH OR WITHOUT FEVER  NAUSEA AND VOMITING THAT IS NOT CONTROLLED WITH YOUR NAUSEA MEDICATION  *UNUSUAL SHORTNESS OF BREATH  *UNUSUAL BRUISING OR BLEEDING  TENDERNESS IN MOUTH AND THROAT WITH OR WITHOUT PRESENCE OF ULCERS  *URINARY PROBLEMS  *BOWEL PROBLEMS  UNUSUAL RASH Items with * indicate a potential emergency and should be followed up as soon as possible.  Feel free to call the clinic should you have any questions or concerns. The clinic phone number is [phone].  Please show the CHEMO ALERT CARD at check-in to the Emergency Department and triage nurse.   Hypomagnesemia Hypomagnesemia is a condition in which the level of magnesium in the blood is low. Magnesium is a mineral that is found in many foods. It is used in many different processes in the body. Hypomagnesemia can affect every organ in the body. In severe cases, it can cause life-threatening problems. What are the causes? This condition may be caused by:  Not getting enough magnesium in your diet.  Malnutrition.  Problems with absorbing magnesium from the intestines.  Dehydration.  Alcohol abuse.  Vomiting.  Severe or chronic diarrhea.  Some medicines, including medicines that make you urinate more (diuretics).  Certain diseases, such as kidney disease, diabetes, celiac disease, and overactive thyroid. What are the signs or symptoms? Symptoms of this condition include:  Loss of appetite.  Nausea and vomiting.  Involuntary  shaking or trembling of a body part (tremor).  Muscle weakness.  Tingling in the arms and legs.  Sudden tightening of muscles (muscle spasms).  Confusion.  Psychiatric issues, such as depression, irritability, or psychosis.  A feeling of fluttering of the heart.  Seizures. These symptoms are more severe if magnesium levels drop suddenly. How is this diagnosed? This condition may be diagnosed based on:  Your symptoms and medical history.  A physical exam.  Blood and urine tests. How is this treated? Treatment depends on the cause and the severity of the condition. It may be treated with:  A magnesium supplement. This can be taken in pill form. If the condition is severe, magnesium is usually given through an IV.  Changes to your diet. You may be directed to eat foods that have a lot of magnesium, such as green leafy vegetables, peas, beans, and nuts.  Stopping any intake of alcohol. Follow these instructions at home:      Make sure that your diet includes foods with magnesium. Foods that have a lot of magnesium in them include: ? Green leafy vegetables, such as spinach and broccoli. ? Beans and peas. ? Nuts and seeds, such as almonds and sunflower seeds. ? Whole grains, such as whole grain bread and fortified cereals.  Take magnesium supplements if your health care provider tells you to do that. Take them as directed.  Take over-the-counter and prescription medicines only as told by your health care provider.  Have your magnesium levels monitored as told by your health care provider.  When you are active, drink fluids that contain electrolytes.  Avoid drinking alcohol.  Keep all follow-up visits   as told by your health care provider. This is important. Contact a health care provider if:  You get worse instead of better.  Your symptoms return. Get help right away if you:  Develop severe muscle weakness.  Have trouble breathing.  Feel that your heart is  racing. Summary  Hypomagnesemia is a condition in which the level of magnesium in the blood is low.  Hypomagnesemia can affect every organ in the body.  Treatment may include eating more foods that contain magnesium, taking magnesium supplements, and not drinking alcohol.  Have your magnesium levels monitored as told by your health care provider. This information is not intended to replace advice given to you by your health care provider. Make sure you discuss any questions you have with your health care provider. Document Released: 09/14/2004 Document Revised: 11/20/2016 Document Reviewed: 11/20/2016 Elsevier Interactive Patient Education  2019 [employer]  [facility] Discharge Instructions for Patients Receiving Chemotherapy  Today you received the following chemotherapy agents Vectibix  To help prevent nausea and vomiting after your treatment, we encourage you to take your nausea medication as directed.  If you develop nausea and vomiting that is not controlled by your nausea medication, call the clinic.   BELOW ARE SYMPTOMS THAT SHOULD BE REPORTED IMMEDIATELY:  *FEVER GREATER THAN 100.5 F  *CHILLS WITH OR WITHOUT FEVER  NAUSEA AND VOMITING THAT IS NOT CONTROLLED WITH YOUR NAUSEA MEDICATION  *UNUSUAL SHORTNESS OF BREATH  *UNUSUAL BRUISING OR BLEEDING  TENDERNESS IN MOUTH AND THROAT WITH OR WITHOUT PRESENCE OF ULCERS  *URINARY PROBLEMS  *BOWEL PROBLEMS  UNUSUAL RASH Items with * indicate a potential emergency and should be followed up as soon as possible.  Feel free to call the clinic should you have any questions or concerns. The clinic phone number is [phone].  Please show the CHEMO ALERT CARD at check-in to the Emergency Department and triage nurse.   Hypomagnesemia Hypomagnesemia is a condition in which the level of magnesium in the blood is low. Magnesium is a mineral that is found in many foods. It is used in many different processes  in the body. Hypomagnesemia can affect every organ in the body. In severe cases, it can cause life-threatening problems. What are the causes? This condition may be caused by:  Not getting enough magnesium in your diet.  Malnutrition.  Problems with absorbing magnesium from the intestines.  Dehydration.  Alcohol abuse.  Vomiting.  Severe or chronic diarrhea.  Some medicines, including medicines that make you urinate more (diuretics).  Certain diseases, such as kidney disease, diabetes, celiac disease, and overactive thyroid. What are the signs or symptoms? Symptoms of this condition include:  Loss of appetite.  Nausea and vomiting.  Involuntary shaking or trembling of a body part (tremor).  Muscle weakness.  Tingling in the arms and legs.  Sudden tightening of muscles (muscle spasms).  Confusion.  Psychiatric issues, such as depression, irritability, or psychosis.  A feeling of fluttering of the heart.  Seizures. These symptoms are more severe if magnesium levels drop suddenly. How is this diagnosed? This condition may be diagnosed based on:  Your symptoms and medical history.  A physical exam.  Blood and urine tests. How is this treated? Treatment depends on the cause and the severity of the condition. It may be treated with:  A magnesium supplement. This can be taken in pill form. If the condition is severe, magnesium is usually given through an IV.  Changes to your diet. You may   be directed to eat foods that have a lot of magnesium, such as green leafy vegetables, peas, beans, and nuts.  Stopping any intake of alcohol. Follow these instructions at home:      Make sure that your diet includes foods with magnesium. Foods that have a lot of magnesium in them include: ? Green leafy vegetables, such as spinach and broccoli. ? Beans and peas. ? Nuts and seeds, such as almonds and sunflower seeds. ? Whole grains, such as whole grain bread and fortified  cereals.  Take magnesium supplements if your health care provider tells you to do that. Take them as directed.  Take over-the-counter and prescription medicines only as told by your health care provider.  Have your magnesium levels monitored as told by your health care provider.  When you are active, drink fluids that contain electrolytes.  Avoid drinking alcohol.  Keep all follow-up visits as told by your health care provider. This is important. Contact a health care provider if:  You get worse instead of better.  Your symptoms return. Get help right away if you:  Develop severe muscle weakness.  Have trouble breathing.  Feel that your heart is racing. Summary  Hypomagnesemia is a condition in which the level of magnesium in the blood is low.  Hypomagnesemia can affect every organ in the body.  Treatment may include eating more foods that contain magnesium, taking magnesium supplements, and not drinking alcohol.  Have your magnesium levels monitored as told by your health care provider. This information is not intended to replace advice given to you by your health care provider. Make sure you discuss any questions you have with your health care provider. Document Released: 09/14/2004 Document Revised: 11/20/2016 Document Reviewed: 11/20/2016 Elsevier Interactive Patient Education  2019 [employer]  

## 2018-03-14 NOTE — Progress Notes (Signed)
[place] OFFICE PROGRESS NOTE   Diagnosis: Colon cancer  INTERVAL HISTORY:   Brenda Becker completed another treatment with Panitumumab on 02/28/2018.  No new complaint.  She has infrequent diarrhea.  Stable skin rash.  She is taking potassium and magnesium.  Objective:  Vital signs in last 24 hours:  Blood pressure 137/82, pulse 86, temperature 98.3 F (36.8 C), temperature source Oral, resp. rate 18, height 5' 4.2" (1.631 m), weight 229 lb 9.6 oz (104.1 kg), SpO2 100 %.    HEENT: No thrush or ulcers Resp: Lungs clear bilaterally Cardio: Regular rate and rhythm GI: No hepatomegaly, no mass, nontender Vascular: No leg edema  Skin: Mild acne type rash over the face and trunk.  Portacath/PICC-without erythema  Lab Results:  Lab Results  Component Value Date   WBC 6.3 03/14/2018   HGB 12.1 03/14/2018   HCT 37.4 03/14/2018   MCV 86.8 03/14/2018   PLT 225 03/14/2018   NEUTROABS 2.9 03/14/2018    CMP  Lab Results  Component Value Date   NA 143 03/14/2018   K 3.8 03/14/2018   CL 111 03/14/2018   CO2 22 03/14/2018   GLUCOSE 96 03/14/2018   BUN 7 03/14/2018   CREATININE 0.93 03/14/2018   CALCIUM 8.1 (L) 03/14/2018   PROT 6.6 03/14/2018   ALBUMIN 3.4 (L) 03/14/2018   AST 28 03/14/2018   ALT 32 03/14/2018   ALKPHOS 97 03/14/2018   BILITOT 0.3 03/14/2018   GFRNONAA >60 03/14/2018   GFRAA >60 03/14/2018    Lab Results  Component Value Date   CEA1 <1.00 11/22/2017     Medications: I have reviewed the patient's current medications.   Assessment/Plan:  1. 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  Cycle 2 CAPOX 01/22/2015  Cycle 3 CAPOX 02/12/2015   Cycle 4 CAPOX 03/05/2015  Cycle 5 CAPOX 03/26/2015  Cycle 6 CAPOX 04/16/2015  Cycle 7 CAPOX 05/07/2015 (oxaliplatin held due to progressive neuropathy)  Cycle 8 CAPOX 05/28/2015 (oxaliplatin held secondary to neuropathy)  CTs 11/01/2015-no significant change and an 11 mm upper abdomen mesenteric lymph node with partial calcification-treated metastasis?, No other evidence of metastatic disease  CTs 01/21/2016-mild interval progression of previously identified calcified nodal tissue in the upper central abdomen. Development of small adjacent lymph nodes in the interval.  EUS guided biopsy of the calcified upper abdominal mass on 02/17/2016 confirmed metastatic adenocarcinoma  Cycle 1 FOLFIRI/Avastin 03/09/2016  Cycle 2 FOLFIRI/Avastin 03/23/2016  Cycle 3 FOLFIRI/Avastin 04/06/2016 (Neulasta added)  Cycle 4 FOLFIRI/Avastin 04/20/2016  Cycle 5 FOLFIRI/Avastin 05/04/2016  Cycle 6 FOLFIRI/Avastin 05/18/2016  CT abdomen/pelvis 05/30/2016-new retroperitoneal lymphadenopathy in the para-aortic region with the largest node measuring 1.5 cm. Partially calcified mesenteric lymph node measures 12 mm as compared to 14 mm previously. Other scattered less than 1 cmmesenteric lymph nodes remain stable.  PET scan 06/13/2016-hypermetabolic posterior mediastinal node, hypermetabolic retroperitoneal lymph nodes, stable transverse mesocolon mass, bilateral hypermetabolic inguinal nodes  EUS biopsy of posterior mediastinal lymph nodes on 07/20/2016-positive for metastatic adenocarcinoma  Clinical trial at [facility], [id number] Panitumumab, nivolumab, ipilimumab.  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 No change in small calcified mesenteric and retroperitoneal abdominal lymph nodes. No evidence of metastatic disease in the chest.  Maintenance panitumumab beginning 03/29/2017, healed 05/24/2017 secondary to elevated liver enzymes-resumed 06/07/2017  [facility]aging CT[facility] at [facility] 06/04/2017-no evidence of disease progression, unchanged scattered mesenteric/retroperitoneal lymph nodes  Panitumumab every 2 weeks  Restaging CTs 08/27/2017-no evidence of metastatic disease in the chest;unchanged size and appearance of scattered calcified and noncalcified mesenteric and retroperitoneal lymph nodes. Near complete resolution of previously described haziness within the central mesentery. No new evidence of recurrent or metastatic disease within the abdomen or pelvis.  Continue Panitumumab every 2 weeks  Restaging CTs 01/08/2018- stable lymph nodes within the posterior mediastinum, retroperitoneum and transverse mesocolon  Continue Panitumumab every 2 weeks 2. History of Microcytic anemia-likely iron deficiency anemia secondary to #1 and ongoing menses 3. Status post cholecystectomy 4. Family history of breast and ovarian cancer. She has seen the genetics counselor. 5. Delayed nausea following cycle 1 CAPOX. Aloxi and Emend added beginning with cycle 2. 6. Transient episodes of dyspnea following cycle 4 and cycle 5 CAPOX, likely neurotoxicity from oxaliplatin 7. Oxaliplatin neuropathy-improved 8. Mild neutropenia secondary to chemotherapy-Neulasta added beginning with cycle 3 FOLFIRI/Avastin 9. Mild mucositis following cycle 4 FOLFIRI/Avastin 10. Mammogram 05/11/2016-possible mass right breast. Right breast ultrasound 05/17/2016-right breast mass at 10:00 most likely represents a cyst, however is somewhat hypoechoic and could also represent a fibroadenoma. Ultrasound-guided aspiration of a 1 cmbenign cyst 05/22/2016. 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 12. Hypomagnesia secondary to Panitumumab. She continues magnesium oxide. 13. Elevated liver enzymes May 2019-likely secondary to resuming atorvastatin, now normal 14. History of paronychia related to Panitumumab.     Disposition: Brenda Becker appears unchanged.  She is tolerating the Panitumumab well.  She will complete another treatment today.  We will replete the magnesium as needed.  She will return for an office visit and the next cycle of Panitumumab in 2 weeks.  She will be scheduled for an office visit in 1 month.  Betsy Coder, MD  03/14/2018  12:35 PM

## 2018-03-15 ENCOUNTER — Telehealth: Payer: Self-pay | Admitting: Oncology

## 2018-03-15 ENCOUNTER — Other Ambulatory Visit: Payer: Self-pay

## 2018-03-15 NOTE — Telephone Encounter (Signed)
Scheduled appt per 3/12 los - pt to get an updated schedule next visit.   

## 2018-03-24 ENCOUNTER — Other Ambulatory Visit: Payer: Self-pay | Admitting: Oncology

## 2018-03-28 ENCOUNTER — Inpatient Hospital Stay: Payer: 59

## 2018-03-28 ENCOUNTER — Encounter: Payer: Self-pay | Admitting: *Deleted

## 2018-03-28 ENCOUNTER — Other Ambulatory Visit: Payer: Self-pay

## 2018-03-28 VITALS — BP 128/86 | HR 81 | Temp 99.1°F | Resp 18

## 2018-03-28 DIAGNOSIS — C184 Malignant neoplasm of transverse colon: Secondary | ICD-10-CM

## 2018-03-28 DIAGNOSIS — C189 Malignant neoplasm of colon, unspecified: Secondary | ICD-10-CM

## 2018-03-28 DIAGNOSIS — Z5112 Encounter for antineoplastic immunotherapy: Secondary | ICD-10-CM | POA: Diagnosis not present

## 2018-03-28 DIAGNOSIS — Z95828 Presence of other vascular implants and grafts: Secondary | ICD-10-CM

## 2018-03-28 LAB — MAGNESIUM: Magnesium: 1.2 mg/dL — CL (ref 1.7–2.4)

## 2018-03-28 LAB — CBC WITH DIFFERENTIAL (CANCER CENTER ONLY)
Abs Immature Granulocytes: 0.01 10*3/uL (ref 0.00–0.07)
Basophils Absolute: 0 10*3/uL (ref 0.0–0.1)
Basophils Relative: 1 %
Eosinophils Absolute: 0.4 10*3/uL (ref 0.0–0.5)
Eosinophils Relative: 6 %
HEMATOCRIT: 37.3 % (ref 36.0–46.0)
Hemoglobin: 12.2 g/dL (ref 12.0–15.0)
Immature Granulocytes: 0 %
LYMPHS ABS: 2.5 10*3/uL (ref 0.7–4.0)
Lymphocytes Relative: 37 %
MCH: 28.3 pg (ref 26.0–34.0)
MCHC: 32.7 g/dL (ref 30.0–36.0)
MCV: 86.5 fL (ref 80.0–100.0)
Monocytes Absolute: 0.4 10*3/uL (ref 0.1–1.0)
Monocytes Relative: 6 %
Neutro Abs: 3.4 10*3/uL (ref 1.7–7.7)
Neutrophils Relative %: 50 %
Platelet Count: 229 10*3/uL (ref 150–400)
RBC: 4.31 MIL/uL (ref 3.87–5.11)
RDW: 13 % (ref 11.5–15.5)
WBC Count: 6.8 10*3/uL (ref 4.0–10.5)
nRBC: 0 % (ref 0.0–0.2)

## 2018-03-28 LAB — CMP (CANCER CENTER ONLY)
ALK PHOS: 91 U/L (ref 38–126)
ALT: 23 U/L (ref 0–44)
ANION GAP: 11 (ref 5–15)
AST: 19 U/L (ref 15–41)
Albumin: 3.5 g/dL (ref 3.5–5.0)
BUN: 9 mg/dL (ref 6–20)
CO2: 22 mmol/L (ref 22–32)
Calcium: 8.5 mg/dL — ABNORMAL LOW (ref 8.9–10.3)
Chloride: 108 mmol/L (ref 98–111)
Creatinine: 0.99 mg/dL (ref 0.44–1.00)
GFR, Est AFR Am: >60 mL/min (ref >60)
GFR, Estimated: >60 mL/min (ref >60)
Glucose, Bld: 128 mg/dL — ABNORMAL HIGH (ref 70–99)
Potassium: 3.7 mmol/L (ref 3.5–5.1)
Sodium: 141 mmol/L (ref 135–145)
Total Bilirubin: 0.4 mg/dL (ref 0.3–1.2)
Total Protein: 6.9 g/dL (ref 6.5–8.1)

## 2018-03-28 MED ORDER — SODIUM CHLORIDE 0.9 % IV SOLN
6.0000 mg/kg | Freq: Once | INTRAVENOUS | Status: AC
  Administered 2018-03-28: 600 mg via INTRAVENOUS
  Filled 2018-03-28: qty 20

## 2018-03-28 MED ORDER — SODIUM CHLORIDE 0.9 % IV SOLN
Freq: Once | INTRAVENOUS | Status: AC
  Administered 2018-03-28: 11:00:00 via INTRAVENOUS
  Filled 2018-03-28: qty 250

## 2018-03-28 MED ORDER — HEPARIN SOD (PORK) LOCK FLUSH 100 UNIT/ML IV SOLN
500.0000 [IU] | Freq: Once | INTRAVENOUS | Status: AC | PRN
  Administered 2018-03-28: 500 [IU]
  Filled 2018-03-28: qty 5

## 2018-03-28 MED ORDER — SODIUM CHLORIDE 0.9% FLUSH
10.0000 mL | Freq: Once | INTRAVENOUS | Status: AC
  Administered 2018-03-28: 10 mL
  Filled 2018-03-28: qty 10

## 2018-03-28 MED ORDER — SODIUM CHLORIDE 0.9% FLUSH
10.0000 mL | INTRAVENOUS | Status: DC | PRN
  Administered 2018-03-28: 10 mL
  Filled 2018-03-28: qty 10

## 2018-03-28 MED ORDER — SODIUM CHLORIDE 0.9 % IV SOLN
Freq: Once | INTRAVENOUS | Status: AC
  Administered 2018-03-28: 10:00:00 via INTRAVENOUS
  Filled 2018-03-28: qty 250

## 2018-03-28 NOTE — Patient Instructions (Signed)
[facility] Discharge Instructions for Patients Receiving Chemotherapy  Today you received the following chemotherapy agents Vectibix  To help prevent nausea and vomiting after your treatment, we encourage you to take your nausea medication as directed.  If you develop nausea and vomiting that is not controlled by your nausea medication, call the clinic.   BELOW ARE SYMPTOMS THAT SHOULD BE REPORTED IMMEDIATELY:  *FEVER GREATER THAN 100.5 F  *CHILLS WITH OR WITHOUT FEVER  NAUSEA AND VOMITING THAT IS NOT CONTROLLED WITH YOUR NAUSEA MEDICATION  *UNUSUAL SHORTNESS OF BREATH  *UNUSUAL BRUISING OR BLEEDING  TENDERNESS IN MOUTH AND THROAT WITH OR WITHOUT PRESENCE OF ULCERS  *URINARY PROBLEMS  *BOWEL PROBLEMS  UNUSUAL RASH Items with * indicate a potential emergency and should be followed up as soon as possible.  Feel free to call the clinic should you have any questions or concerns. The clinic phone number is [phone].  Please show the CHEMO ALERT CARD at check-in to the Emergency Department and triage nurse.   Hypomagnesemia Hypomagnesemia is a condition in which the level of magnesium in the blood is low. Magnesium is a mineral that is found in many foods. It is used in many different processes in the body. Hypomagnesemia can affect every organ in the body. In severe cases, it can cause life-threatening problems. What are the causes? This condition may be caused by:  Not getting enough magnesium in your diet.  Malnutrition.  Problems with absorbing magnesium from the intestines.  Dehydration.  Alcohol abuse.  Vomiting.  Severe or chronic diarrhea.  Some medicines, including medicines that make you urinate more (diuretics).  Certain diseases, such as kidney disease, diabetes, celiac disease, and overactive thyroid. What are the signs or symptoms? Symptoms of this condition include:  Loss of appetite.  Nausea and vomiting.  Involuntary  shaking or trembling of a body part (tremor).  Muscle weakness.  Tingling in the arms and legs.  Sudden tightening of muscles (muscle spasms).  Confusion.  Psychiatric issues, such as depression, irritability, or psychosis.  A feeling of fluttering of the heart.  Seizures. These symptoms are more severe if magnesium levels drop suddenly. How is this diagnosed? This condition may be diagnosed based on:  Your symptoms and medical history.  A physical exam.  Blood and urine tests. How is this treated? Treatment depends on the cause and the severity of the condition. It may be treated with:  A magnesium supplement. This can be taken in pill form. If the condition is severe, magnesium is usually given through an IV.  Changes to your diet. You may be directed to eat foods that have a lot of magnesium, such as green leafy vegetables, peas, beans, and nuts.  Stopping any intake of alcohol. Follow these instructions at home:      Make sure that your diet includes foods with magnesium. Foods that have a lot of magnesium in them include: ? Green leafy vegetables, such as spinach and broccoli. ? Beans and peas. ? Nuts and seeds, such as almonds and sunflower seeds. ? Whole grains, such as whole grain bread and fortified cereals.  Take magnesium supplements if your health care provider tells you to do that. Take them as directed.  Take over-the-counter and prescription medicines only as told by your health care provider.  Have your magnesium levels monitored as told by your health care provider.  When you are active, drink fluids that contain electrolytes.  Avoid drinking alcohol.  Keep all follow-up visits   as told by your health care provider. This is important. Contact a health care provider if:  You get worse instead of better.  Your symptoms return. Get help right away if you:  Develop severe muscle weakness.  Have trouble breathing.  Feel that your heart is  racing. Summary  Hypomagnesemia is a condition in which the level of magnesium in the blood is low.  Hypomagnesemia can affect every organ in the body.  Treatment may include eating more foods that contain magnesium, taking magnesium supplements, and not drinking alcohol.  Have your magnesium levels monitored as told by your health care provider. This information is not intended to replace advice given to you by your health care provider. Make sure you discuss any questions you have with your health care provider. Document Released: 09/14/2004 Document Revised: 11/20/2016 Document Reviewed: 11/20/2016 Elsevier Interactive Patient Education  2019 [employer]  [facility] Discharge Instructions for Patients Receiving Chemotherapy  Today you received the following chemotherapy agents Vectibix  To help prevent nausea and vomiting after your treatment, we encourage you to take your nausea medication as directed.  If you develop nausea and vomiting that is not controlled by your nausea medication, call the clinic.   BELOW ARE SYMPTOMS THAT SHOULD BE REPORTED IMMEDIATELY:  *FEVER GREATER THAN 100.5 F  *CHILLS WITH OR WITHOUT FEVER  NAUSEA AND VOMITING THAT IS NOT CONTROLLED WITH YOUR NAUSEA MEDICATION  *UNUSUAL SHORTNESS OF BREATH  *UNUSUAL BRUISING OR BLEEDING  TENDERNESS IN MOUTH AND THROAT WITH OR WITHOUT PRESENCE OF ULCERS  *URINARY PROBLEMS  *BOWEL PROBLEMS  UNUSUAL RASH Items with * indicate a potential emergency and should be followed up as soon as possible.  Feel free to call the clinic should you have any questions or concerns. The clinic phone number is [phone].  Please show the CHEMO ALERT CARD at check-in to the Emergency Department and triage nurse.   Hypomagnesemia Hypomagnesemia is a condition in which the level of magnesium in the blood is low. Magnesium is a mineral that is found in many foods. It is used in many different processes  in the body. Hypomagnesemia can affect every organ in the body. In severe cases, it can cause life-threatening problems. What are the causes? This condition may be caused by:  Not getting enough magnesium in your diet.  Malnutrition.  Problems with absorbing magnesium from the intestines.  Dehydration.  Alcohol abuse.  Vomiting.  Severe or chronic diarrhea.  Some medicines, including medicines that make you urinate more (diuretics).  Certain diseases, such as kidney disease, diabetes, celiac disease, and overactive thyroid. What are the signs or symptoms? Symptoms of this condition include:  Loss of appetite.  Nausea and vomiting.  Involuntary shaking or trembling of a body part (tremor).  Muscle weakness.  Tingling in the arms and legs.  Sudden tightening of muscles (muscle spasms).  Confusion.  Psychiatric issues, such as depression, irritability, or psychosis.  A feeling of fluttering of the heart.  Seizures. These symptoms are more severe if magnesium levels drop suddenly. How is this diagnosed? This condition may be diagnosed based on:  Your symptoms and medical history.  A physical exam.  Blood and urine tests. How is this treated? Treatment depends on the cause and the severity of the condition. It may be treated with:  A magnesium supplement. This can be taken in pill form. If the condition is severe, magnesium is usually given through an IV.  Changes to your diet. You may   be directed to eat foods that have a lot of magnesium, such as green leafy vegetables, peas, beans, and nuts.  Stopping any intake of alcohol. Follow these instructions at home:      Make sure that your diet includes foods with magnesium. Foods that have a lot of magnesium in them include: ? Green leafy vegetables, such as spinach and broccoli. ? Beans and peas. ? Nuts and seeds, such as almonds and sunflower seeds. ? Whole grains, such as whole grain bread and fortified  cereals.  Take magnesium supplements if your health care provider tells you to do that. Take them as directed.  Take over-the-counter and prescription medicines only as told by your health care provider.  Have your magnesium levels monitored as told by your health care provider.  When you are active, drink fluids that contain electrolytes.  Avoid drinking alcohol.  Keep all follow-up visits as told by your health care provider. This is important. Contact a health care provider if:  You get worse instead of better.  Your symptoms return. Get help right away if you:  Develop severe muscle weakness.  Have trouble breathing.  Feel that your heart is racing. Summary  Hypomagnesemia is a condition in which the level of magnesium in the blood is low.  Hypomagnesemia can affect every organ in the body.  Treatment may include eating more foods that contain magnesium, taking magnesium supplements, and not drinking alcohol.  Have your magnesium levels monitored as told by your health care provider. This information is not intended to replace advice given to you by your health care provider. Make sure you discuss any questions you have with your health care provider. Document Released: 09/14/2004 Document Revised: 11/20/2016 Document Reviewed: 11/20/2016 Elsevier Interactive Patient Education  2019 [employer]vier Inc.  

## 2018-03-28 NOTE — Progress Notes (Signed)
Critical value received.  Mag 1.2. Secure chat sent to Merceda Elks, RN with Dr. [name].  Message acknowledged.

## 2018-03-28 NOTE — Progress Notes (Signed)
MD review of labs: OK to treat today with Mg+ 1.2. Pharmacy will dose magnesium infusion today.

## 2018-04-07 ENCOUNTER — Other Ambulatory Visit: Payer: Self-pay | Admitting: Oncology

## 2018-04-11 ENCOUNTER — Inpatient Hospital Stay: Payer: 59

## 2018-04-11 ENCOUNTER — Inpatient Hospital Stay (HOSPITAL_BASED_OUTPATIENT_CLINIC_OR_DEPARTMENT_OTHER): Payer: 59 | Admitting: Oncology

## 2018-04-11 ENCOUNTER — Telehealth: Payer: Self-pay | Admitting: Oncology

## 2018-04-11 ENCOUNTER — Other Ambulatory Visit: Payer: Self-pay

## 2018-04-11 ENCOUNTER — Inpatient Hospital Stay: Payer: 59 | Attending: Oncology

## 2018-04-11 DIAGNOSIS — Z5112 Encounter for antineoplastic immunotherapy: Secondary | ICD-10-CM | POA: Insufficient documentation

## 2018-04-11 DIAGNOSIS — C184 Malignant neoplasm of transverse colon: Secondary | ICD-10-CM | POA: Diagnosis not present

## 2018-04-11 DIAGNOSIS — R197 Diarrhea, unspecified: Secondary | ICD-10-CM | POA: Diagnosis not present

## 2018-04-11 DIAGNOSIS — C189 Malignant neoplasm of colon, unspecified: Secondary | ICD-10-CM

## 2018-04-11 DIAGNOSIS — Z95828 Presence of other vascular implants and grafts: Secondary | ICD-10-CM

## 2018-04-11 LAB — COMPREHENSIVE METABOLIC PANEL
ALT: 27 U/L (ref 0–44)
AST: 29 U/L (ref 15–41)
Albumin: 3.7 g/dL (ref 3.5–5.0)
Alkaline Phosphatase: 91 U/L (ref 38–126)
Anion gap: 8 (ref 5–15)
BUN: 10 mg/dL (ref 6–20)
CO2: 25 mmol/L (ref 22–32)
Calcium: 8.6 mg/dL — ABNORMAL LOW (ref 8.9–10.3)
Chloride: 109 mmol/L (ref 98–111)
Creatinine, Ser: 0.98 mg/dL (ref 0.44–1.00)
GFR calc Af Amer: >60 mL/min (ref >60)
GFR calc non Af Amer: >60 mL/min (ref >60)
Glucose, Bld: 93 mg/dL (ref 70–99)
Potassium: 3.7 mmol/L (ref 3.5–5.1)
Sodium: 142 mmol/L (ref 135–145)
Total Bilirubin: 0.5 mg/dL (ref 0.3–1.2)
Total Protein: 7 g/dL (ref 6.5–8.1)

## 2018-04-11 LAB — CBC WITH DIFFERENTIAL (CANCER CENTER ONLY)
Abs Immature Granulocytes: 0.01 10*3/uL (ref 0.00–0.07)
Basophils Absolute: 0.1 10*3/uL (ref 0.0–0.1)
Basophils Relative: 1 %
Eosinophils Absolute: 0.4 10*3/uL (ref 0.0–0.5)
Eosinophils Relative: 6 %
HCT: 39.9 % (ref 36.0–46.0)
Hemoglobin: 12.9 g/dL (ref 12.0–15.0)
Immature Granulocytes: 0 %
Lymphocytes Relative: 39 %
Lymphs Abs: 2.3 10*3/uL (ref 0.7–4.0)
MCH: 28.3 pg (ref 26.0–34.0)
MCHC: 32.3 g/dL (ref 30.0–36.0)
MCV: 87.5 fL (ref 80.0–100.0)
Monocytes Absolute: 0.6 10*3/uL (ref 0.1–1.0)
Monocytes Relative: 10 %
Neutro Abs: 2.7 10*3/uL (ref 1.7–7.7)
Neutrophils Relative %: 44 %
Platelet Count: 231 10*3/uL (ref 150–400)
RBC: 4.56 MIL/uL (ref 3.87–5.11)
RDW: 13.1 % (ref 11.5–15.5)
WBC Count: 6 10*3/uL (ref 4.0–10.5)
nRBC: 0 % (ref 0.0–0.2)

## 2018-04-11 LAB — MAGNESIUM: Magnesium: 1.2 mg/dL — ABNORMAL LOW (ref 1.7–2.4)

## 2018-04-11 MED ORDER — DOXYCYCLINE HYCLATE 100 MG PO CAPS: 100.0000 mg | ORAL_CAPSULE | Freq: Two times a day (BID) | ORAL | 2 refills | Status: DC

## 2018-04-11 MED ORDER — SODIUM CHLORIDE 0.9 % IV SOLN
6.0000 mg/kg | Freq: Once | INTRAVENOUS | Status: AC
  Administered 2018-04-11: 600 mg via INTRAVENOUS
  Filled 2018-04-11: qty 20

## 2018-04-11 MED ORDER — HEPARIN SOD (PORK) LOCK FLUSH 100 UNIT/ML IV SOLN
500.0000 [IU] | Freq: Once | INTRAVENOUS | Status: AC | PRN
  Administered 2018-04-11: 500 [IU]
  Filled 2018-04-11: qty 5

## 2018-04-11 MED ORDER — SODIUM CHLORIDE 0.9% FLUSH
10.0000 mL | Freq: Once | INTRAVENOUS | Status: AC
  Administered 2018-04-11: 10 mL
  Filled 2018-04-11: qty 10

## 2018-04-11 MED ORDER — SODIUM CHLORIDE 0.9 % IV SOLN
6.0000 g | Freq: Once | INTRAVENOUS | Status: AC
  Administered 2018-04-11: 6 g via INTRAVENOUS
  Filled 2018-04-11: qty 12

## 2018-04-11 MED ORDER — POTASSIUM CHLORIDE CRYS ER 20 MEQ PO TBCR: EXTENDED_RELEASE_TABLET | ORAL | 2 refills | Status: DC

## 2018-04-11 MED ORDER — SODIUM CHLORIDE 0.9 % IV SOLN
Freq: Once | INTRAVENOUS | Status: AC
  Administered 2018-04-11: 10:00:00 via INTRAVENOUS
  Filled 2018-04-11: qty 250

## 2018-04-11 MED ORDER — SODIUM CHLORIDE 0.9% FLUSH
10.0000 mL | INTRAVENOUS | Status: DC | PRN
  Administered 2018-04-11: 10 mL
  Filled 2018-04-11: qty 10

## 2018-04-11 MED ORDER — FLUTICASONE PROPIONATE 0.05 % EX CREA: TOPICAL_CREAM | Freq: Every day | CUTANEOUS | 1 refills | Status: DC | PRN

## 2018-04-11 NOTE — Progress Notes (Signed)
[place] OFFICE PROGRESS NOTE   Diagnosis: Colon cancer  INTERVAL HISTORY:   Brenda Becker is as scheduled.  She completed another treatment with Panitumumab on 03/28/2018.  No diarrhea.  She has a persistent rash.  She is bothered by the rash over the nose and face.  She continues doxycycline.  Objective:  Vital signs in last 24 hours:   Blood pressure 130/79, pulse 78, temperature 98.1 F (36.7 C), temperature source Oral, resp. rate 18, height 5' 4.2" (1.631 m), weight 229 lb 14.4 oz (104.3 kg), SpO2 100 %.    Skin: Acne type rash over the face, trunk, and arms  Portacath/PICC-without erythema  Lab Results:  Lab Results  Component Value Date   WBC 6.0 04/11/2018   HGB 12.9 04/11/2018   HCT 39.9 04/11/2018   MCV 87.5 04/11/2018   PLT 231 04/11/2018   NEUTROABS 2.7 04/11/2018    CMP  Lab Results  Component Value Date   NA 141 03/28/2018   K 3.7 03/28/2018   CL 108 03/28/2018   CO2 22 03/28/2018   GLUCOSE 128 (H) 03/28/2018   BUN 9 03/28/2018   CREATININE 0.99 03/28/2018   CALCIUM 8.5 (L) 03/28/2018   PROT 6.9 03/28/2018   ALBUMIN 3.5 03/28/2018   AST 19 03/28/2018   ALT 23 03/28/2018   ALKPHOS 91 03/28/2018   BILITOT 0.4 03/28/2018   GFRNONAA >60 03/28/2018   GFRAA >60 03/28/2018    Lab Results  Component Value Date   CEA1 <1.00 11/22/2017     Medications: I have reviewed the patient's current medications.   Assessment/Plan: 1. 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  Cycle 2 CAPOX 01/22/2015  Cycle 3 CAPOX 02/12/2015  Cycle 4 CAPOX 03/05/2015  Cycle 5 CAPOX 03/26/2015  Cycle 6 CAPOX 04/16/2015  Cycle 7 CAPOX 05/07/2015  (oxaliplatin held due to progressive neuropathy)  Cycle 8 CAPOX 05/28/2015 (oxaliplatin held secondary to neuropathy)  CTs 11/01/2015-no significant change and an 11 mm upper abdomen mesenteric lymph node with partial calcification-treated metastasis?, No other evidence of metastatic disease  CTs 01/21/2016-mild interval progression of previously identified calcified nodal tissue in the upper central abdomen. Development of small adjacent lymph nodes in the interval.  EUS guided biopsy of the calcified upper abdominal mass on 02/17/2016 confirmed metastatic adenocarcinoma  Cycle 1 FOLFIRI/Avastin 03/09/2016  Cycle 2 FOLFIRI/Avastin 03/23/2016  Cycle 3 FOLFIRI/Avastin 04/06/2016 (Neulasta added)  Cycle 4 FOLFIRI/Avastin 04/20/2016  Cycle 5 FOLFIRI/Avastin 05/04/2016  Cycle 6 FOLFIRI/Avastin 05/18/2016  CT abdomen/pelvis 05/30/2016-new retroperitoneal lymphadenopathy in the para-aortic region with the largest node measuring 1.5 cm. Partially calcified mesenteric lymph node measures 12 mm as compared to 14 mm previously. Other scattered less than 1 cmmesenteric lymph nodes remain stable.  PET scan 06/13/2016-hypermetabolic posterior mediastinal node, hypermetabolic retroperitoneal lymph nodes, stable transverse mesocolon mass, bilateral hypermetabolic inguinal nodes  EUS biopsy of posterior mediastinal lymph nodes on 07/20/2016-positive for metastatic adenocarcinoma  Clinical trial [facility], [id number] Panitumumab, nivolumab, ipilimumab.  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 No  change in small calcified mesenteric and retroperitoneal abdominal lymph nodes.  No evidence of metastatic disease in the chest.  Maintenance panitumumab beginning 03/29/2017, healed 05/24/2017 secondary to elevated liver enzymes-resumed 06/07/2017  Restaging CTs at [facility] 06/04/2017-no evidence of disease progression, unchanged scattered mesenteric/retroperitoneal lymph nodes  Panitumumab every 2 weeks  Restaging CTs 08/27/2017-no evidence of metastatic disease in the chest;unchanged size and appearance of scattered calcified and noncalcified mesenteric and retroperitoneal lymph nodes. Near complete resolution of previously described haziness within the central mesentery. No new evidence of recurrent or metastatic disease within the abdomen or pelvis.  Continue Panitumumab every 2 weeks  Restaging CTs 01/08/2018- stable lymph nodes within the posterior mediastinum, retroperitoneum and transverse mesocolon  Continue Panitumumab every 2 weeks 2. History of Microcytic anemia-likely iron deficiency anemia secondary to #1 and ongoing menses 3. Status post cholecystectomy 4. Family history of breast and ovarian cancer. She has seen the genetics counselor. 5. Delayed nausea following cycle 1 CAPOX. Aloxi and Emend added beginning with cycle 2. 6. Transient episodes of dyspnea following cycle 4 and cycle 5 CAPOX, likely neurotoxicity from oxaliplatin 7. Oxaliplatin neuropathy-improved 8. Mild neutropenia secondary to chemotherapy-Neulasta added beginning with cycle 3 FOLFIRI/Avastin 9. Mild mucositis following cycle 4 FOLFIRI/Avastin 10. Mammogram 05/11/2016-possible mass right breast. Right breast ultrasound 05/17/2016-right breast mass at 10:00 most likely represents a cyst, however is somewhat hypoechoic and could also represent a fibroadenoma. Ultrasound-guided aspiration of a 1 cmbenign cyst 05/22/2016. 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 12. Hypomagnesia secondary to Panitumumab. She continues magnesium oxide. 13. Elevated liver enzymes  May 2019-likely secondary to resuming atorvastatin, now normal 14. History of paronychia related to Panitumumab.    Disposition: Brenda Becker appears unchanged.  She continues every 2-week panitumumab.  The plan is to schedule restaging CTs in late June. She continues to receive IV magnesium as needed on days of treatment.  She will try fluticasone cream for the face rash.  Betsy Coder, MD  04/11/2018  9:44 AM

## 2018-04-11 NOTE — Progress Notes (Signed)
Spoke w/ Manuela Schwartz - per Dr. [name], give patient same dose of magnesium as she received last treatment for magnesium level of 1.2. Orders for magnesium 6 g IV over 3 hours entered.   Demetrius Charity, PharmD, [facility] Pharmacist Pharmacy Phone: [phone] 04/11/2018

## 2018-04-11 NOTE — Progress Notes (Unsigned)
Critical MG reported to and read back by RN Manuela Schwartz Cowered at 11.15am

## 2018-04-11 NOTE — Addendum Note (Signed)
Addended by: Tania Ade on: 04/11/2018 09:56 AM   Modules accepted: Orders

## 2018-04-11 NOTE — Telephone Encounter (Signed)
Scheduled appt per 4/9 los. °

## 2018-04-11 NOTE — Patient Instructions (Signed)
[facility] Discharge Instructions for Patients Receiving Chemotherapy  Today you received the following chemotherapy agents Vectibix  To help prevent nausea and vomiting after your treatment, we encourage you to take your nausea medication as directed.  If you develop nausea and vomiting that is not controlled by your nausea medication, call the clinic.   BELOW ARE SYMPTOMS THAT SHOULD BE REPORTED IMMEDIATELY:  *FEVER GREATER THAN 100.5 F  *CHILLS WITH OR WITHOUT FEVER  NAUSEA AND VOMITING THAT IS NOT CONTROLLED WITH YOUR NAUSEA MEDICATION  *UNUSUAL SHORTNESS OF BREATH  *UNUSUAL BRUISING OR BLEEDING  TENDERNESS IN MOUTH AND THROAT WITH OR WITHOUT PRESENCE OF ULCERS  *URINARY PROBLEMS  *BOWEL PROBLEMS  UNUSUAL RASH Items with * indicate a potential emergency and should be followed up as soon as possible.  Feel free to call the clinic should you have any questions or concerns. The clinic phone number is [phone].  Please show the [place] at check-in to the Emergency Department and triage nurse.   Hypomagnesemia Hypomagnesemia is a condition in which the level of magnesium in the blood is low. Magnesium is a mineral that is found in many foods. It is used in many different processes in the body. Hypomagnesemia can affect every organ in the body. In severe cases, it can cause life-threatening problems. What are the causes? This condition may be caused by:  Not getting enough magnesium in your diet.  Malnutrition.  Problems with absorbing magnesium from the intestines.  Dehydration.  Alcohol abuse.  Vomiting.  Severe or chronic diarrhea.  Some medicines, including medicines that make you urinate more (diuretics).  Certain diseases, such as kidney disease, diabetes, celiac disease, and overactive thyroid. What are the signs or symptoms? Symptoms of this condition include:  Loss of appetite.  Nausea and vomiting.  Involuntary  shaking or trembling of a body part (tremor).  Muscle weakness.  Tingling in the arms and legs.  Sudden tightening of muscles (muscle spasms).  Confusion.  Psychiatric issues, such as depression, irritability, or psychosis.  A feeling of fluttering of the heart.  Seizures. These symptoms are more severe if magnesium levels drop suddenly. How is this diagnosed? This condition may be diagnosed based on:  Your symptoms and medical history.  A physical exam.  Blood and urine tests. How is this treated? Treatment depends on the cause and the severity of the condition. It may be treated with:  A magnesium supplement. This can be taken in pill form. If the condition is severe, magnesium is usually given through an IV.  Changes to your diet. You may be directed to eat foods that have a lot of magnesium, such as green leafy vegetables, peas, beans, and nuts.  Stopping any intake of alcohol. Follow these instructions at home:      Make sure that your diet includes foods with magnesium. Foods that have a lot of magnesium in them include: ? Green leafy vegetables, such as spinach and broccoli. ? Beans and peas. ? Nuts and seeds, such as almonds and sunflower seeds. ? Whole grains, such as whole grain bread and fortified cereals.  Take magnesium supplements if your health care provider tells you to do that. Take them as directed.  Take over-the-counter and prescription medicines only as told by your health care provider.  Have your magnesium levels monitored as told by your health care provider.  When you are active, drink fluids that contain electrolytes.  Avoid drinking alcohol.  Keep all follow-up visits  as told by your health care provider. This is important. Contact a health care provider if:  You get worse instead of better.  Your symptoms return. Get help right away if you:  Develop severe muscle weakness.  Have trouble breathing.  Feel that your heart is  racing. Summary  Hypomagnesemia is a condition in which the level of magnesium in the blood is low.  Hypomagnesemia can affect every organ in the body.  Treatment may include eating more foods that contain magnesium, taking magnesium supplements, and not drinking alcohol.  Have your magnesium levels monitored as told by your health care provider. This information is not intended to replace advice given to you by your health care provider. Make sure you discuss any questions you have with your health care provider. Document Released: 09/14/2004 Document Revised: 11/20/2016 Document Reviewed: 11/20/2016 Elsevier Interactive Patient Education  2019 [place] Discharge Instructions for Patients Receiving Chemotherapy  Today you received the following chemotherapy agents Vectibix  To help prevent nausea and vomiting after your treatment, we encourage you to take your nausea medication as directed.  If you develop nausea and vomiting that is not controlled by your nausea medication, call the clinic.   BELOW ARE SYMPTOMS THAT SHOULD BE REPORTED IMMEDIATELY:  *FEVER GREATER THAN 100.5 F  *CHILLS WITH OR WITHOUT FEVER  NAUSEA AND VOMITING THAT IS NOT CONTROLLED WITH YOUR NAUSEA MEDICATION  *UNUSUAL SHORTNESS OF BREATH  *UNUSUAL BRUISING OR BLEEDING  TENDERNESS IN MOUTH AND THROAT WITH OR WITHOUT PRESENCE OF ULCERS  *URINARY PROBLEMS  *BOWEL PROBLEMS  UNUSUAL RASH Items with * indicate a potential emergency and should be followed up as soon as possible.  Feel free to call the clinic should you have any questions or concerns. The clinic phone number is [phone].  Please show the [place] at check-in to the Emergency Department and triage nurse.   Hypomagnesemia Hypomagnesemia is a condition in which the level of magnesium in the blood is low. Magnesium is a mineral that is found in many foods. It is used in many different processes  in the body. Hypomagnesemia can affect every organ in the body. In severe cases, it can cause life-threatening problems. What are the causes? This condition may be caused by:  Not getting enough magnesium in your diet.  Malnutrition.  Problems with absorbing magnesium from the intestines.  Dehydration.  Alcohol abuse.  Vomiting.  Severe or chronic diarrhea.  Some medicines, including medicines that make you urinate more (diuretics).  Certain diseases, such as kidney disease, diabetes, celiac disease, and overactive thyroid. What are the signs or symptoms? Symptoms of this condition include:  Loss of appetite.  Nausea and vomiting.  Involuntary shaking or trembling of a body part (tremor).  Muscle weakness.  Tingling in the arms and legs.  Sudden tightening of muscles (muscle spasms).  Confusion.  Psychiatric issues, such as depression, irritability, or psychosis.  A feeling of fluttering of the heart.  Seizures. These symptoms are more severe if magnesium levels drop suddenly. How is this diagnosed? This condition may be diagnosed based on:  Your symptoms and medical history.  A physical exam.  Blood and urine tests. How is this treated? Treatment depends on the cause and the severity of the condition. It may be treated with:  A magnesium supplement. This can be taken in pill form. If the condition is severe, magnesium is usually given through an IV.  Changes to your diet. You may  be directed to eat foods that have a lot of magnesium, such as green leafy vegetables, peas, beans, and nuts.  Stopping any intake of alcohol. Follow these instructions at home:      Make sure that your diet includes foods with magnesium. Foods that have a lot of magnesium in them include: ? Green leafy vegetables, such as spinach and broccoli. ? Beans and peas. ? Nuts and seeds, such as almonds and sunflower seeds. ? Whole grains, such as whole grain bread and fortified  cereals.  Take magnesium supplements if your health care provider tells you to do that. Take them as directed.  Take over-the-counter and prescription medicines only as told by your health care provider.  Have your magnesium levels monitored as told by your health care provider.  When you are active, drink fluids that contain electrolytes.  Avoid drinking alcohol.  Keep all follow-up visits as told by your health care provider. This is important. Contact a health care provider if:  You get worse instead of better.  Your symptoms return. Get help right away if you:  Develop severe muscle weakness.  Have trouble breathing.  Feel that your heart is racing. Summary  Hypomagnesemia is a condition in which the level of magnesium in the blood is low.  Hypomagnesemia can affect every organ in the body.  Treatment may include eating more foods that contain magnesium, taking magnesium supplements, and not drinking alcohol.  Have your magnesium levels monitored as told by your health care provider. This information is not intended to replace advice given to you by your health care provider. Make sure you discuss any questions you have with your health care provider. Document Released: 09/14/2004 Document Revised: 11/20/2016 Document Reviewed: 11/20/2016 Elsevier Interactive Patient Education  2019 [employer].

## 2018-04-12 ENCOUNTER — Telehealth: Payer: Self-pay | Admitting: *Deleted

## 2018-04-12 NOTE — Telephone Encounter (Signed)
VM from patient that [facility] did not receive the fluticasone cream that was e-scribed on 04/11/18. Called pharmacy and left script on their refill line.

## 2018-04-22 ENCOUNTER — Other Ambulatory Visit: Payer: Self-pay | Admitting: Oncology

## 2018-04-25 ENCOUNTER — Inpatient Hospital Stay: Payer: 59

## 2018-04-25 ENCOUNTER — Other Ambulatory Visit: Payer: Self-pay

## 2018-04-25 VITALS — BP 124/84 | HR 73 | Temp 97.9°F | Resp 20

## 2018-04-25 DIAGNOSIS — C189 Malignant neoplasm of colon, unspecified: Secondary | ICD-10-CM

## 2018-04-25 DIAGNOSIS — Z95828 Presence of other vascular implants and grafts: Secondary | ICD-10-CM

## 2018-04-25 DIAGNOSIS — Z5112 Encounter for antineoplastic immunotherapy: Secondary | ICD-10-CM | POA: Diagnosis not present

## 2018-04-25 DIAGNOSIS — C184 Malignant neoplasm of transverse colon: Secondary | ICD-10-CM

## 2018-04-25 LAB — MAGNESIUM: Magnesium: 1.1 mg/dL — CL (ref 1.7–2.4)

## 2018-04-25 LAB — CMP (CANCER CENTER ONLY)
ALT: 18 U/L (ref 0–44)
AST: 16 U/L (ref 15–41)
Albumin: 3.3 g/dL — ABNORMAL LOW (ref 3.5–5.0)
Alkaline Phosphatase: 96 U/L (ref 38–126)
Anion gap: 11 (ref 5–15)
BUN: 8 mg/dL (ref 6–20)
CO2: 22 mmol/L (ref 22–32)
Calcium: 8.3 mg/dL — ABNORMAL LOW (ref 8.9–10.3)
Chloride: 109 mmol/L (ref 98–111)
Creatinine: 1.03 mg/dL — ABNORMAL HIGH (ref 0.44–1.00)
GFR, Est AFR Am: >60 mL/min (ref >60)
GFR, Estimated: >60 mL/min (ref >60)
Glucose, Bld: 127 mg/dL — ABNORMAL HIGH (ref 70–99)
Potassium: 3.7 mmol/L (ref 3.5–5.1)
Sodium: 142 mmol/L (ref 135–145)
Total Bilirubin: 0.4 mg/dL (ref 0.3–1.2)
Total Protein: 6.8 g/dL (ref 6.5–8.1)

## 2018-04-25 MED ORDER — MAGNESIUM SULFATE 2 GM/50ML IV SOLN
2.0000 g | Freq: Once | INTRAVENOUS | Status: AC
  Administered 2018-04-25: 2 g via INTRAVENOUS
  Filled 2018-04-25: qty 50

## 2018-04-25 MED ORDER — SODIUM CHLORIDE 0.9% FLUSH
10.0000 mL | Freq: Once | INTRAVENOUS | Status: AC
  Administered 2018-04-25: 10 mL
  Filled 2018-04-25: qty 10

## 2018-04-25 MED ORDER — SODIUM CHLORIDE 0.9% FLUSH
10.0000 mL | INTRAVENOUS | Status: DC | PRN
  Administered 2018-04-25: 12:00:00 10 mL
  Filled 2018-04-25: qty 10

## 2018-04-25 MED ORDER — SODIUM CHLORIDE 0.9 % IV SOLN
6.0000 mg/kg | Freq: Once | INTRAVENOUS | Status: AC
  Administered 2018-04-25: 11:00:00 600 mg via INTRAVENOUS
  Filled 2018-04-25: qty 10

## 2018-04-25 MED ORDER — HEPARIN SOD (PORK) LOCK FLUSH 100 UNIT/ML IV SOLN
500.0000 [IU] | Freq: Once | INTRAVENOUS | Status: AC | PRN
  Administered 2018-04-25: 500 [IU]
  Filled 2018-04-25: qty 5

## 2018-04-25 MED ORDER — SODIUM CHLORIDE 0.9 % IV SOLN
Freq: Once | INTRAVENOUS | Status: AC
  Administered 2018-04-25: 09:00:00 via INTRAVENOUS
  Filled 2018-04-25: qty 250

## 2018-04-25 NOTE — Patient Instructions (Signed)
[facility] Discharge Instructions for Patients Receiving Chemotherapy  Today you received the following chemotherapy agents Vectibix  To help prevent nausea and vomiting after your treatment, we encourage you to take your nausea medication as directed.  If you develop nausea and vomiting that is not controlled by your nausea medication, call the clinic.   BELOW ARE SYMPTOMS THAT SHOULD BE REPORTED IMMEDIATELY:  *FEVER GREATER THAN 100.5 F  *CHILLS WITH OR WITHOUT FEVER  NAUSEA AND VOMITING THAT IS NOT CONTROLLED WITH YOUR NAUSEA MEDICATION  *UNUSUAL SHORTNESS OF BREATH  *UNUSUAL BRUISING OR BLEEDING  TENDERNESS IN MOUTH AND THROAT WITH OR WITHOUT PRESENCE OF ULCERS  *URINARY PROBLEMS  *BOWEL PROBLEMS  UNUSUAL RASH Items with * indicate a potential emergency and should be followed up as soon as possible.  Feel free to call the clinic should you have any questions or concerns. The clinic phone number is [phone].  Please show the [place] at check-in to the Emergency Department and triage nurse.   Hypomagnesemia Hypomagnesemia is a condition in which the level of magnesium in the blood is low. Magnesium is a mineral that is found in many foods. It is used in many different processes in the body. Hypomagnesemia can affect every organ in the body. In severe cases, it can cause life-threatening problems. What are the causes? This condition may be caused by:  Not getting enough magnesium in your diet.  Malnutrition.  Problems with absorbing magnesium from the intestines.  Dehydration.  Alcohol abuse.  Vomiting.  Severe or chronic diarrhea.  Some medicines, including medicines that make you urinate more (diuretics).  Certain diseases, such as kidney disease, diabetes, celiac disease, and overactive thyroid. What are the signs or symptoms? Symptoms of this condition include:  Loss of appetite.  Nausea and vomiting.  Involuntary  shaking or trembling of a body part (tremor).  Muscle weakness.  Tingling in the arms and legs.  Sudden tightening of muscles (muscle spasms).  Confusion.  Psychiatric issues, such as depression, irritability, or psychosis.  A feeling of fluttering of the heart.  Seizures. These symptoms are more severe if magnesium levels drop suddenly. How is this diagnosed? This condition may be diagnosed based on:  Your symptoms and medical history.  A physical exam.  Blood and urine tests. How is this treated? Treatment depends on the cause and the severity of the condition. It may be treated with:  A magnesium supplement. This can be taken in pill form. If the condition is severe, magnesium is usually given through an IV.  Changes to your diet. You may be directed to eat foods that have a lot of magnesium, such as green leafy vegetables, peas, beans, and nuts.  Stopping any intake of alcohol. Follow these instructions at home:      Make sure that your diet includes foods with magnesium. Foods that have a lot of magnesium in them include: ? Green leafy vegetables, such as spinach and broccoli. ? Beans and peas. ? Nuts and seeds, such as almonds and sunflower seeds. ? Whole grains, such as whole grain bread and fortified cereals.  Take magnesium supplements if your health care provider tells you to do that. Take them as directed.  Take over-the-counter and prescription medicines only as told by your health care provider.  Have your magnesium levels monitored as told by your health care provider.  When you are active, drink fluids that contain electrolytes.  Avoid drinking alcohol.  Keep all follow-up visits  as told by your health care provider. This is important. Contact a health care provider if:  You get worse instead of better.  Your symptoms return. Get help right away if you:  Develop severe muscle weakness.  Have trouble breathing.  Feel that your heart is  racing. Summary  Hypomagnesemia is a condition in which the level of magnesium in the blood is low.  Hypomagnesemia can affect every organ in the body.  Treatment may include eating more foods that contain magnesium, taking magnesium supplements, and not drinking alcohol.  Have your magnesium levels monitored as told by your health care provider. This information is not intended to replace advice given to you by your health care provider. Make sure you discuss any questions you have with your health care provider. Document Released: 09/14/2004 Document Revised: 11/20/2016 Document Reviewed: 11/20/2016 Elsevier Interactive Patient Education  2019 [place] Discharge Instructions for Patients Receiving Chemotherapy  Today you received the following chemotherapy agents Vectibix  To help prevent nausea and vomiting after your treatment, we encourage you to take your nausea medication as directed.  If you develop nausea and vomiting that is not controlled by your nausea medication, call the clinic.   BELOW ARE SYMPTOMS THAT SHOULD BE REPORTED IMMEDIATELY:  *FEVER GREATER THAN 100.5 F  *CHILLS WITH OR WITHOUT FEVER  NAUSEA AND VOMITING THAT IS NOT CONTROLLED WITH YOUR NAUSEA MEDICATION  *UNUSUAL SHORTNESS OF BREATH  *UNUSUAL BRUISING OR BLEEDING  TENDERNESS IN MOUTH AND THROAT WITH OR WITHOUT PRESENCE OF ULCERS  *URINARY PROBLEMS  *BOWEL PROBLEMS  UNUSUAL RASH Items with * indicate a potential emergency and should be followed up as soon as possible.  Feel free to call the clinic should you have any questions or concerns. The clinic phone number is [phone].  Please show the [place] at check-in to the Emergency Department and triage nurse.   Hypomagnesemia Hypomagnesemia is a condition in which the level of magnesium in the blood is low. Magnesium is a mineral that is found in many foods. It is used in many different processes  in the body. Hypomagnesemia can affect every organ in the body. In severe cases, it can cause life-threatening problems. What are the causes? This condition may be caused by:  Not getting enough magnesium in your diet.  Malnutrition.  Problems with absorbing magnesium from the intestines.  Dehydration.  Alcohol abuse.  Vomiting.  Severe or chronic diarrhea.  Some medicines, including medicines that make you urinate more (diuretics).  Certain diseases, such as kidney disease, diabetes, celiac disease, and overactive thyroid. What are the signs or symptoms? Symptoms of this condition include:  Loss of appetite.  Nausea and vomiting.  Involuntary shaking or trembling of a body part (tremor).  Muscle weakness.  Tingling in the arms and legs.  Sudden tightening of muscles (muscle spasms).  Confusion.  Psychiatric issues, such as depression, irritability, or psychosis.  A feeling of fluttering of the heart.  Seizures. These symptoms are more severe if magnesium levels drop suddenly. How is this diagnosed? This condition may be diagnosed based on:  Your symptoms and medical history.  A physical exam.  Blood and urine tests. How is this treated? Treatment depends on the cause and the severity of the condition. It may be treated with:  A magnesium supplement. This can be taken in pill form. If the condition is severe, magnesium is usually given through an IV.  Changes to your diet. You may  be directed to eat foods that have a lot of magnesium, such as green leafy vegetables, peas, beans, and nuts.  Stopping any intake of alcohol. Follow these instructions at home:      Make sure that your diet includes foods with magnesium. Foods that have a lot of magnesium in them include: ? Green leafy vegetables, such as spinach and broccoli. ? Beans and peas. ? Nuts and seeds, such as almonds and sunflower seeds. ? Whole grains, such as whole grain bread and fortified  cereals.  Take magnesium supplements if your health care provider tells you to do that. Take them as directed.  Take over-the-counter and prescription medicines only as told by your health care provider.  Have your magnesium levels monitored as told by your health care provider.  When you are active, drink fluids that contain electrolytes.  Avoid drinking alcohol.  Keep all follow-up visits as told by your health care provider. This is important. Contact a health care provider if:  You get worse instead of better.  Your symptoms return. Get help right away if you:  Develop severe muscle weakness.  Have trouble breathing.  Feel that your heart is racing. Summary  Hypomagnesemia is a condition in which the level of magnesium in the blood is low.  Hypomagnesemia can affect every organ in the body.  Treatment may include eating more foods that contain magnesium, taking magnesium supplements, and not drinking alcohol.  Have your magnesium levels monitored as told by your health care provider. This information is not intended to replace advice given to you by your health care provider. Make sure you discuss any questions you have with your health care provider. Document Released: 09/14/2004 Document Revised: 11/20/2016 Document Reviewed: 11/20/2016 Elsevier Interactive Patient Education  2019 [employer].

## 2018-04-25 NOTE — Progress Notes (Signed)
Per Dr. [name]: OK to treat today with Mg+ 1.1. Will give 2 grams IV Mg+ today.

## 2018-04-25 NOTE — Progress Notes (Signed)
Per Dr. [name], add magnesium 2 grams to patient's treatment today. Orders added to treatment plan.  Demetrius Charity, PharmD, [facility] Pharmacist Pharmacy Phone: [phone] 04/25/2018

## 2018-05-05 ENCOUNTER — Other Ambulatory Visit: Payer: Self-pay | Admitting: Oncology

## 2018-05-09 ENCOUNTER — Inpatient Hospital Stay: Payer: 59

## 2018-05-09 ENCOUNTER — Inpatient Hospital Stay: Payer: 59 | Attending: Oncology

## 2018-05-09 ENCOUNTER — Inpatient Hospital Stay (HOSPITAL_BASED_OUTPATIENT_CLINIC_OR_DEPARTMENT_OTHER): Payer: 59 | Admitting: Oncology

## 2018-05-09 ENCOUNTER — Telehealth: Payer: Self-pay | Admitting: Oncology

## 2018-05-09 ENCOUNTER — Other Ambulatory Visit: Payer: Self-pay

## 2018-05-09 VITALS — BP 143/85 | HR 73

## 2018-05-09 VITALS — BP 134/91 | HR 81 | Temp 97.7°F | Resp 18 | Ht 64.2 in | Wt 232.6 lb

## 2018-05-09 DIAGNOSIS — R21 Rash and other nonspecific skin eruption: Secondary | ICD-10-CM

## 2018-05-09 DIAGNOSIS — Z95828 Presence of other vascular implants and grafts: Secondary | ICD-10-CM

## 2018-05-09 DIAGNOSIS — C184 Malignant neoplasm of transverse colon: Secondary | ICD-10-CM | POA: Diagnosis not present

## 2018-05-09 DIAGNOSIS — C189 Malignant neoplasm of colon, unspecified: Secondary | ICD-10-CM

## 2018-05-09 DIAGNOSIS — R197 Diarrhea, unspecified: Secondary | ICD-10-CM | POA: Diagnosis not present

## 2018-05-09 DIAGNOSIS — Z5112 Encounter for antineoplastic immunotherapy: Secondary | ICD-10-CM | POA: Diagnosis not present

## 2018-05-09 LAB — CMP (CANCER CENTER ONLY)
ALT: 24 U/L (ref 0–44)
AST: 22 U/L (ref 15–41)
Albumin: 3.5 g/dL (ref 3.5–5.0)
Alkaline Phosphatase: 100 U/L (ref 38–126)
Anion gap: 11 (ref 5–15)
BUN: 7 mg/dL (ref 6–20)
CO2: 23 mmol/L (ref 22–32)
Calcium: 8.7 mg/dL — ABNORMAL LOW (ref 8.9–10.3)
Chloride: 109 mmol/L (ref 98–111)
Creatinine: 0.94 mg/dL (ref 0.44–1.00)
GFR, Est AFR Am: >60 mL/min (ref >60)
GFR, Estimated: >60 mL/min (ref >60)
Glucose, Bld: 103 mg/dL — ABNORMAL HIGH (ref 70–99)
Potassium: 3.8 mmol/L (ref 3.5–5.1)
Sodium: 143 mmol/L (ref 135–145)
Total Bilirubin: 0.2 mg/dL — ABNORMAL LOW (ref 0.3–1.2)
Total Protein: 7 g/dL (ref 6.5–8.1)

## 2018-05-09 LAB — MAGNESIUM: Magnesium: 1.1 mg/dL — CL (ref 1.7–2.4)

## 2018-05-09 MED ORDER — SODIUM CHLORIDE 0.9 % IV SOLN
6.0000 mg/kg | Freq: Once | INTRAVENOUS | Status: AC
  Administered 2018-05-09: 600 mg via INTRAVENOUS
  Filled 2018-05-09: qty 20

## 2018-05-09 MED ORDER — HEPARIN SOD (PORK) LOCK FLUSH 100 UNIT/ML IV SOLN
500.0000 [IU] | Freq: Once | INTRAVENOUS | Status: AC | PRN
  Administered 2018-05-09: 500 [IU]
  Filled 2018-05-09: qty 5

## 2018-05-09 MED ORDER — SODIUM CHLORIDE 0.9% FLUSH
10.0000 mL | Freq: Once | INTRAVENOUS | Status: AC
  Administered 2018-05-09: 10 mL
  Filled 2018-05-09: qty 10

## 2018-05-09 MED ORDER — SODIUM CHLORIDE 0.9% FLUSH
10.0000 mL | INTRAVENOUS | Status: DC | PRN
  Administered 2018-05-09: 10 mL
  Filled 2018-05-09: qty 10

## 2018-05-09 MED ORDER — SODIUM CHLORIDE 0.9 % IV SOLN
Freq: Once | INTRAVENOUS | Status: AC
  Administered 2018-05-09: 10:00:00 via INTRAVENOUS
  Filled 2018-05-09: qty 250

## 2018-05-09 MED ORDER — SODIUM CHLORIDE 0.9 % IV SOLN
Freq: Once | INTRAVENOUS | Status: AC
  Administered 2018-05-09: 11:00:00 via INTRAVENOUS
  Filled 2018-05-09: qty 250

## 2018-05-09 NOTE — Patient Instructions (Signed)
Coronavirus (COVID-19) Are you at risk?  Are you at risk for the Coronavirus (COVID-19)?  To be considered HIGH RISK for Coronavirus (COVID-19), you have to meet the following criteria:  . Traveled to [place], [place], [place], [place] or [place]; or in the [place] to [place], [place], [place], or [place]; and have fever, cough, and shortness of breath within the last 2 weeks of travel OR . Been in close contact with a person diagnosed with COVID-19 within the last 2 weeks and have fever, cough, and shortness of breath . IF YOU DO NOT MEET THESE CRITERIA, YOU ARE CONSIDERED LOW RISK FOR COVID-19.  What to do if you are HIGH RISK for COVID-19?  . If you are having a medical emergency, call 911. . Seek medical care right away. Before you go to a doctor's office, urgent care or emergency department, call ahead and tell them about your recent travel, contact with someone diagnosed with COVID-19, and your symptoms. You should receive instructions from your physician's office regarding next steps of care.  . When you arrive at healthcare provider, tell the healthcare staff immediately you have returned from visiting [place], [place], [place], [place] or [place]; or traveled in the [place] to [place], [place], [place], or [place]; in the last two weeks or you have been in close contact with a person diagnosed with COVID-19 in the last 2 weeks.   . Tell the health care staff about your symptoms: fever, cough and shortness of breath. . After you have been seen by a medical provider, you will be either: o Tested for (COVID-19) and discharged home on quarantine except to seek medical care if symptoms worsen, and asked to  - Stay home and avoid contact with others until you get your results (4-5 days)  - Avoid travel on public transportation if possible (such as bus, train, or airplane) or o Sent to the Emergency Department by EMS for evaluation, COVID-19 testing, and possible  admission depending on your condition and test results.  What to do if you are LOW RISK for COVID-19?  Reduce your risk of any infection by using the same precautions used for avoiding the common cold or flu:  . Wash your hands often with soap and warm water for at least 20 seconds.  If soap and water are not readily available, use an alcohol-based hand sanitizer with at least 60% alcohol.  . If coughing or sneezing, cover your mouth and nose by coughing or sneezing into the elbow areas of your shirt or coat, into a tissue or into your sleeve (not your hands). . Avoid shaking hands with others and consider head nods or verbal greetings only. . Avoid touching your eyes, nose, or mouth with unwashed hands.  . Avoid close contact with people who are sick. . Avoid places or events with large numbers of people in one location, like concerts or sporting events. . Carefully consider travel plans you have or are making. . If you are planning any travel outside or inside the [place], visit the CDC's Travelers' Health webpage for the latest health notices. . If you have some symptoms but not all symptoms, continue to monitor at home and seek medical attention if your symptoms worsen. . If you are having a medical emergency, call 911.   ADDITIONAL HEALTHCARE OPTIONS FOR PATIENTS  [facility] / e-Visit: [email or web address]         [facility]: [phone]  [facility]: [phone]                   [facility]: [phone]   [facility] Discharge Instructions for Patients Receiving Chemotherapy  Today you received the following chemotherapy agents Vectibix  To help prevent nausea and vomiting after your treatment, we encourage you to take your nausea medication as directed.    If you develop nausea and vomiting that is not controlled by your nausea medication, call the clinic.   BELOW ARE  SYMPTOMS THAT SHOULD BE REPORTED IMMEDIATELY:  *FEVER GREATER THAN 100.5 F  *CHILLS WITH OR WITHOUT FEVER  NAUSEA AND VOMITING THAT IS NOT CONTROLLED WITH YOUR NAUSEA MEDICATION  *UNUSUAL SHORTNESS OF BREATH  *UNUSUAL BRUISING OR BLEEDING  TENDERNESS IN MOUTH AND THROAT WITH OR WITHOUT PRESENCE OF ULCERS  *URINARY PROBLEMS  *BOWEL PROBLEMS  UNUSUAL RASH Items with * indicate a potential emergency and should be followed up as soon as possible.  Feel free to call the clinic should you have any questions or concerns. The clinic phone number is [phone].  Please show the CHEMO ALERT CARD at check-in to the Emergency Department and triage nurse.   

## 2018-05-09 NOTE — Telephone Encounter (Signed)
Scheduled appt per 5/7 los. °

## 2018-05-09 NOTE — Progress Notes (Signed)
Per Dr. [name]: OK to treat with Mg+ 1.1 today. Will infuse 3 grams Mg+ today--pharmacy will place orders.

## 2018-05-09 NOTE — Patient Instructions (Signed)

## 2018-05-09 NOTE — Progress Notes (Signed)
Brenda Becker OFFICE PROGRESS NOTE   Diagnosis: Colon cancer  INTERVAL HISTORY:   Brenda Becker Becker returns as scheduled.  She continues every 2-week panitumumab.  She has diarrhea approximately 2-3 times per week.  The face rash is worse since she is wearing a mask.  No other complaint.  Objective:  Vital signs in last 24 hours:  Blood pressure (!) 134/91, pulse 81, temperature 97.7 F (36.5 C), temperature source Oral, resp. rate 18, height 5' 4.2" (1.631 m), weight 232 lb 9.6 oz (105.5 kg), SpO2 100 %.     Skin: Acne type rash over the forehead and malar areas, similar rash at the upper anterior chest surrounding the Port-A-Cath  Portacath/PICC-without erythema  Lab Results:  Lab Results  Component Value Date   WBC 6.0 04/11/2018   HGB 12.9 04/11/2018   HCT 39.9 04/11/2018   MCV 87.5 04/11/2018   PLT 231 04/11/2018   NEUTROABS 2.7 04/11/2018    CMP  Lab Results  Component Value Date   NA 142 04/25/2018   K 3.7 04/25/2018   CL 109 04/25/2018   CO2 22 04/25/2018   GLUCOSE 127 (H) 04/25/2018   BUN 8 04/25/2018   CREATININE 1.03 (H) 04/25/2018   CALCIUM 8.3 (L) 04/25/2018   PROT 6.8 04/25/2018   ALBUMIN 3.3 (L) 04/25/2018   AST 16 04/25/2018   ALT 18 04/25/2018   ALKPHOS 96 04/25/2018   BILITOT 0.4 04/25/2018   GFRNONAA >60 04/25/2018   GFRAA >60 04/25/2018    Lab Results  Component Value Date   CEA1 <1.00 11/22/2017     Medications: I have reviewed the patient's current medications.   Assessment/Plan: 1. 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  Cycle 2 CAPOX 01/22/2015  Cycle 3 CAPOX 02/12/2015  Cycle 4 CAPOX 03/05/2015  Cycle 5 CAPOX  03/26/2015  Cycle 6 CAPOX 04/16/2015  Cycle 7 CAPOX 05/07/2015 (oxaliplatin held due to progressive neuropathy)  Cycle 8 CAPOX 05/28/2015 (oxaliplatin held secondary to neuropathy)  CTs 11/01/2015-no significant change and an 11 mm upper abdomen mesenteric lymph node with partial calcification-treated metastasis?, No other evidence of metastatic disease  CTs 01/21/2016-mild interval progression of previously identified calcified nodal tissue in the upper central abdomen. Development of small adjacent lymph nodes in the interval.  EUS guided biopsy of the calcified upper abdominal mass on 02/17/2016 confirmed metastatic adenocarcinoma  Cycle 1 FOLFIRI/Avastin 03/09/2016  Cycle 2 FOLFIRI/Avastin 03/23/2016  Cycle 3 FOLFIRI/Avastin 04/06/2016 (Neulasta added)  Cycle 4 FOLFIRI/Avastin 04/20/2016  Cycle 5 FOLFIRI/Avastin 05/04/2016  Cycle 6 FOLFIRI/Avastin 05/18/2016  CT abdomen/pelvis 05/30/2016-new retroperitoneal lymphadenopathy in the para-aortic region with the largest node measuring 1.5 cm. Partially calcified mesenteric lymph node measures 12 mm as compared to 14 mm previously. Other scattered less than 1 cmmesenteric lymph nodes remain stable.  PET scan 06/13/2016-hypermetabolic posterior mediastinal node, hypermetabolic retroperitoneal lymph nodes, stable transverse mesocolon mass, bilateral hypermetabolic inguinal nodes  EUS biopsy of posterior mediastinal lymph nodes on 07/20/2016-positive for metastatic adenocarcinoma  Clinical trial at [facility], [id number] Panitumumab, nivolumab, ipilimumab.  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 No change in  small calcified mesenteric and retroperitoneal abdominal lymph nodes. No evidence of metastatic disease in the chest.  Maintenance panitumumab beginning 03/29/2017, healed 05/24/2017 secondary to elevated liver enzymes-resumed 06/07/2017  Restaging CTs at [facility] 06/04/2017-no evidence of disease progression, unchanged scattered mesenteric/retroperitoneal lymph nodes  Panitumumab every 2 weeks  Restaging CTs 08/27/2017-no evidence of metastatic disease in the chest;unchanged size and appearance of scattered calcified and noncalcified mesenteric and retroperitoneal lymph nodes. Near complete resolution of previously described haziness within the central mesentery. No new evidence of recurrent or metastatic disease within the abdomen or pelvis.  Continue Panitumumab every 2 weeks  Restaging CTs 01/08/2018- stable lymph nodes within the posterior mediastinum, retroperitoneum and transverse mesocolon  Continue Panitumumab every 2 weeks 2. History of Microcytic anemia-likely iron deficiency anemia secondary to #1 and ongoing menses 3. Status post cholecystectomy 4. Family history of breast and ovarian cancer. She has seen the genetics counselor. 5. Delayed nausea following cycle 1 CAPOX. Aloxi and Emend added beginning with cycle 2. 6. Transient episodes of dyspnea following cycle 4 and cycle 5 CAPOX, likely neurotoxicity from oxaliplatin 7. Oxaliplatin neuropathy-improved 8. Mild neutropenia secondary to chemotherapy-Neulasta added beginning with cycle 3 FOLFIRI/Avastin 9. Mild mucositis following cycle 4 FOLFIRI/Avastin 10. Mammogram 05/11/2016-possible mass right breast. Right breast ultrasound 05/17/2016-right breast mass at 10:00 most likely represents a cyst, however is somewhat hypoechoic and could also represent a fibroadenoma. Ultrasound-guided aspiration of a 1 cmbenign cyst 05/22/2016. 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 12. Hypomagnesia secondary to  Panitumumab. She continues magnesium oxide. 13. Elevated liver enzymes May 2019-likely secondary to resuming atorvastatin, now normal 14. History of paronychia related to Panitumumab.     Disposition: [name] appears unchanged.  She will continue fluticasone cream as needed to the face rash.  She continues every 2-week panitumumab.  We administer IV magnesium as needed.  She is taking magnesium and potassium supplements.  She will return for Panitumumab in 2 weeks and an office visit in 4 weeks. The plan is to schedule restaging CTs immediately prior to the office visit on 07/04/2018.  Betsy Coder, MD  05/09/2018  9:41 AM

## 2018-05-19 ENCOUNTER — Other Ambulatory Visit: Payer: Self-pay | Admitting: Oncology

## 2018-05-20 ENCOUNTER — Encounter: Payer: PRIVATE HEALTH INSURANCE | Admitting: Obstetrics & Gynecology

## 2018-05-20 ENCOUNTER — Other Ambulatory Visit: Payer: Self-pay

## 2018-05-21 ENCOUNTER — Telehealth: Payer: Self-pay | Admitting: *Deleted

## 2018-05-21 NOTE — Telephone Encounter (Signed)
Telephone call to patient to request she come in 5/20 at 1100am for lab, 1130 for flush and 1200 for infusion in an effort to limit the number of patients in the waiting rooms. Asked for a return call to confirm.

## 2018-05-22 ENCOUNTER — Other Ambulatory Visit: Payer: Self-pay | Admitting: Obstetrics & Gynecology

## 2018-05-22 ENCOUNTER — Ambulatory Visit (INDEPENDENT_AMBULATORY_CARE_PROVIDER_SITE_OTHER): Payer: 59 | Admitting: Obstetrics & Gynecology

## 2018-05-22 ENCOUNTER — Other Ambulatory Visit: Payer: Self-pay

## 2018-05-22 ENCOUNTER — Encounter: Payer: PRIVATE HEALTH INSURANCE | Admitting: Obstetrics & Gynecology

## 2018-05-22 ENCOUNTER — Encounter: Payer: Self-pay | Admitting: Obstetrics & Gynecology

## 2018-05-22 VITALS — BP 140/86 | Ht 64.5 in | Wt 230.0 lb

## 2018-05-22 DIAGNOSIS — Z1231 Encounter for screening mammogram for malignant neoplasm of breast: Secondary | ICD-10-CM

## 2018-05-22 DIAGNOSIS — C189 Malignant neoplasm of colon, unspecified: Secondary | ICD-10-CM | POA: Diagnosis not present

## 2018-05-22 DIAGNOSIS — Z3041 Encounter for surveillance of contraceptive pills: Secondary | ICD-10-CM | POA: Diagnosis not present

## 2018-05-22 DIAGNOSIS — Z01419 Encounter for gynecological examination (general) (routine) without abnormal findings: Secondary | ICD-10-CM | POA: Diagnosis not present

## 2018-05-22 MED ORDER — NORETHIN ACE-ETH ESTRAD-FE 1-20 MG-MCG PO TABS: 1.0000 | ORAL_TABLET | Freq: Every day | ORAL | 4 refills | Status: DC

## 2018-05-22 NOTE — Progress Notes (Signed)
Brenda Becker [birth year] [id number]   History:    48 y.o. G1P1L1 Married.  Daughter is 86 yo  RP:  Established patient presenting for annual gyn exam   HPI: Well on Junel Fe 1/20.  No breakthrough bleeding.  No pelvic pain.  No recent intercourse as patient is still going through chemotherapy for colon Cancer, followed by Dr [name].  Breasts normal.  Urine and bowel movements currently normal.  Body mass index 38.87.  Not regularly physically active at this time.  Health Labs Dr Clayborne Artist MD.   Past medical history,surgical history, family history and social history were all reviewed and documented in the EPIC chart.  Gynecologic History No LMP recorded. (Menstrual status: Chemotherapy). Contraception: abstinence and OCP (estrogen/progesterone) Last Pap: 05/2016. Results were: Negative/HPV HR neg Last mammogram: 05/2017. Results were: Negative. Bone Density: Never Colonoscopy: 2016  Obstetric History OB History  Gravida Para Term Preterm AB Living  1 1       1   SAB TAB Ectopic Multiple Live Births               # Outcome Date GA Lbr Len/2nd Weight Sex Delivery Anes PTL Lv  1 Para              ROS: A ROS was performed and pertinent positives and negatives are included in the history.  GENERAL: No fevers or chills. HEENT: No change in vision, no earache, sore throat or sinus congestion. NECK: No pain or stiffness. CARDIOVASCULAR: No chest pain or pressure. No palpitations. PULMONARY: No shortness of breath, cough or wheeze. GASTROINTESTINAL: No abdominal pain, nausea, vomiting or diarrhea, melena or bright red blood per rectum. GENITOURINARY: No urinary frequency, urgency, hesitancy or dysuria. MUSCULOSKELETAL: No joint or muscle pain, no back pain, no recent trauma. DERMATOLOGIC: No rash, no itching, no lesions. ENDOCRINE: No polyuria, polydipsia, no heat or cold intolerance. No recent change in weight. HEMATOLOGICAL: No anemia or easy bruising or bleeding. NEUROLOGIC: No  headache, seizures, numbness, tingling or weakness. PSYCHIATRIC: No depression, no loss of interest in normal activity or change in sleep pattern.     Exam:   BP 140/86   Ht 5' 4.5" (1.638 m)   Wt 230 lb (104.3 kg)   BMI 38.87 kg/m   Body mass index is 38.87 kg/m.  General appearance : Well developed well nourished female. No acute distress HEENT: Eyes: no retinal hemorrhage or exudates,  Neck supple, trachea midline, no carotid bruits, no thyroidmegaly Lungs: Clear to auscultation, no rhonchi or wheezes, or rib retractions  Heart: Regular rate and rhythm, no murmurs or gallops Breast:Examined in sitting and supine position were symmetrical in appearance, no palpable masses or tenderness,  no skin retraction, no nipple inversion, no nipple discharge, no skin discoloration, no axillary or supraclavicular lymphadenopathy Abdomen: no palpable masses or tenderness, no rebound or guarding Extremities: no edema or skin discoloration or tenderness  Pelvic: Vulva: Normal             Vagina: No gross lesions or discharge  Cervix: No gross lesions or discharge.  Pap reflex done.  Uterus  AV, normal size, shape and consistency, non-tender and mobile  Adnexa  Without masses or tenderness  Anus: Normal   Assessment/Plan:  48 y.o. female for annual exam   1. Encounter for routine gynecological examination with Papanicolaou smear of cervix Normal gynecologic exam.  Pap reflex done.  Breast exam normal.  Health labs with family physician.  Body mass index 38.87.  Encouraged to exercise as much as possible in the context of her colon cancer.  Healthy nutrition. - Pap IG w/ reflex to HPV when ASC-U  2. Encounter for surveillance of contraceptive pills Well on Junel FE 1/20.  No contraindication to continue.  Prescription sent to pharmacy.  3. Malignant neoplasm of colon, unspecified part of colon [facility]) Chemotherapy for colon cancer followed by Dr. [name].  Other orders -  norethindrone-ethinyl estradiol (LOESTRIN FE) 1-20 MG-MCG tablet; Take 1 tablet by mouth daily.  [name] MD, 12:11 PM 05/22/2018

## 2018-05-23 ENCOUNTER — Telehealth: Payer: Self-pay | Admitting: *Deleted

## 2018-05-23 ENCOUNTER — Inpatient Hospital Stay: Payer: 59

## 2018-05-23 ENCOUNTER — Other Ambulatory Visit: Payer: Self-pay

## 2018-05-23 VITALS — BP 138/81 | HR 92 | Temp 97.9°F | Resp 16

## 2018-05-23 DIAGNOSIS — Z5112 Encounter for antineoplastic immunotherapy: Secondary | ICD-10-CM | POA: Diagnosis not present

## 2018-05-23 DIAGNOSIS — C184 Malignant neoplasm of transverse colon: Secondary | ICD-10-CM

## 2018-05-23 DIAGNOSIS — C189 Malignant neoplasm of colon, unspecified: Secondary | ICD-10-CM

## 2018-05-23 DIAGNOSIS — Z95828 Presence of other vascular implants and grafts: Secondary | ICD-10-CM

## 2018-05-23 LAB — CMP (CANCER CENTER ONLY)
ALT: 23 U/L (ref 0–44)
AST: 17 U/L (ref 15–41)
Albumin: 3.4 g/dL — ABNORMAL LOW (ref 3.5–5.0)
Alkaline Phosphatase: 106 U/L (ref 38–126)
Anion gap: 9 (ref 5–15)
BUN: 11 mg/dL (ref 6–20)
CO2: 23 mmol/L (ref 22–32)
Calcium: 8.9 mg/dL (ref 8.9–10.3)
Chloride: 108 mmol/L (ref 98–111)
Creatinine: 1.11 mg/dL — ABNORMAL HIGH (ref 0.44–1.00)
GFR, Est AFR Am: >60 mL/min (ref >60)
GFR, Estimated: 59 mL/min — ABNORMAL LOW (ref >60)
Glucose, Bld: 105 mg/dL — ABNORMAL HIGH (ref 70–99)
Potassium: 4 mmol/L (ref 3.5–5.1)
Sodium: 140 mmol/L (ref 135–145)
Total Bilirubin: 0.3 mg/dL (ref 0.3–1.2)
Total Protein: 7.1 g/dL (ref 6.5–8.1)

## 2018-05-23 LAB — PAP IG W/ RFLX HPV ASCU

## 2018-05-23 LAB — MAGNESIUM: Magnesium: 1.3 mg/dL — CL (ref 1.7–2.4)

## 2018-05-23 MED ORDER — MAGNESIUM SULFATE 4 GM/100ML IV SOLN
4.0000 g | Freq: Once | INTRAVENOUS | Status: AC
  Administered 2018-05-23: 13:00:00 4 g via INTRAVENOUS
  Filled 2018-05-23: qty 100

## 2018-05-23 MED ORDER — HEPARIN SOD (PORK) LOCK FLUSH 100 UNIT/ML IV SOLN
500.0000 [IU] | Freq: Once | INTRAVENOUS | Status: AC | PRN
  Administered 2018-05-23: 500 [IU]
  Filled 2018-05-23: qty 5

## 2018-05-23 MED ORDER — SODIUM CHLORIDE 0.9 % IV SOLN
6.0000 mg/kg | Freq: Once | INTRAVENOUS | Status: AC
  Administered 2018-05-23: 600 mg via INTRAVENOUS
  Filled 2018-05-23: qty 20

## 2018-05-23 MED ORDER — SODIUM CHLORIDE 0.9% FLUSH
10.0000 mL | Freq: Once | INTRAVENOUS | Status: AC
  Administered 2018-05-23: 11:00:00 10 mL
  Filled 2018-05-23: qty 10

## 2018-05-23 MED ORDER — SODIUM CHLORIDE 0.9 % IV SOLN
Freq: Once | INTRAVENOUS | Status: AC
  Administered 2018-05-23: 12:00:00 via INTRAVENOUS
  Filled 2018-05-23: qty 250

## 2018-05-23 MED ORDER — SODIUM CHLORIDE 0.9% FLUSH
10.0000 mL | INTRAVENOUS | Status: DC | PRN
  Administered 2018-05-23: 16:00:00 10 mL
  Filled 2018-05-23: qty 10

## 2018-05-23 NOTE — Telephone Encounter (Signed)
Received call report from [facility].  "Today's Mg+ = 1.3."  Message left with results.    Further communication through infusion nurse per treatment protocol standards.

## 2018-05-23 NOTE — Patient Instructions (Signed)
[place] Discharge Instructions for Patients Receiving Chemotherapy  Today you received the following chemotherapy agents Vectibix  To help prevent nausea and vomiting after your treatment, we encourage you to take your nausea medication as directed.  If you develop nausea and vomiting that is not controlled by your nausea medication, call the clinic.   BELOW ARE SYMPTOMS THAT SHOULD BE REPORTED IMMEDIATELY:  *FEVER GREATER THAN 100.5 F  *CHILLS WITH OR WITHOUT FEVER  NAUSEA AND VOMITING THAT IS NOT CONTROLLED WITH YOUR NAUSEA MEDICATION  *UNUSUAL SHORTNESS OF BREATH  *UNUSUAL BRUISING OR BLEEDING  TENDERNESS IN MOUTH AND THROAT WITH OR WITHOUT PRESENCE OF ULCERS  *URINARY PROBLEMS  *BOWEL PROBLEMS  UNUSUAL RASH Items with * indicate a potential emergency and should be followed up as soon as possible.  Feel free to call the clinic should you have any questions or concerns. The clinic phone number is [phone].  Please show the [place] at check-in to the Emergency Department and triage nurse.   Hypomagnesemia Hypomagnesemia is a condition in which the level of magnesium in the blood is low. Magnesium is a mineral that is found in many foods. It is used in many different processes in the body. Hypomagnesemia can affect every organ in the body. In severe cases, it can cause life-threatening problems. What are the causes? This condition may be caused by:  Not getting enough magnesium in your diet.  Malnutrition.  Problems with absorbing magnesium from the intestines.  Dehydration.  Alcohol abuse.  Vomiting.  Severe or chronic diarrhea.  Some medicines, including medicines that make you urinate more (diuretics).  Certain diseases, such as kidney disease, diabetes, celiac disease, and overactive thyroid. What are the signs or symptoms? Symptoms of this condition include:  Loss of appetite.  Nausea and vomiting.  Involuntary  shaking or trembling of a body part (tremor).  Muscle weakness.  Tingling in the arms and legs.  Sudden tightening of muscles (muscle spasms).  Confusion.  Psychiatric issues, such as depression, irritability, or psychosis.  A feeling of fluttering of the heart.  Seizures. These symptoms are more severe if magnesium levels drop suddenly. How is this diagnosed? This condition may be diagnosed based on:  Your symptoms and medical history.  A physical exam.  Blood and urine tests. How is this treated? Treatment depends on the cause and the severity of the condition. It may be treated with:  A magnesium supplement. This can be taken in pill form. If the condition is severe, magnesium is usually given through an IV.  Changes to your diet. You may be directed to eat foods that have a lot of magnesium, such as green leafy vegetables, peas, beans, and nuts.  Stopping any intake of alcohol. Follow these instructions at home:      Make sure that your diet includes foods with magnesium. Foods that have a lot of magnesium in them include: ? Green leafy vegetables, such as spinach and broccoli. ? Beans and peas. ? Nuts and seeds, such as almonds and sunflower seeds. ? Whole grains, such as whole grain bread and fortified cereals.  Take magnesium supplements if your health care provider tells you to do that. Take them as directed.  Take over-the-counter and prescription medicines only as told by your health care provider.  Have your magnesium levels monitored as told by your health care provider.  When you are active, drink fluids that contain electrolytes.  Avoid drinking alcohol.  Keep all follow-up visits  as told by your health care provider. This is important. Contact a health care provider if:  You get worse instead of better.  Your symptoms return. Get help right away if you:  Develop severe muscle weakness.  Have trouble breathing.  Feel that your heart is  racing. Summary  Hypomagnesemia is a condition in which the level of magnesium in the blood is low.  Hypomagnesemia can affect every organ in the body.  Treatment may include eating more foods that contain magnesium, taking magnesium supplements, and not drinking alcohol.  Have your magnesium levels monitored as told by your health care provider. This information is not intended to replace advice given to you by your health care provider. Make sure you discuss any questions you have with your health care provider. Document Released: 09/14/2004 Document Revised: 11/20/2016 Document Reviewed: 11/20/2016 Elsevier Interactive Patient Education  2019 [facility] Discharge Instructions for Patients Receiving Chemotherapy  Today you received the following chemotherapy agents Vectibix  To help prevent nausea and vomiting after your treatment, we encourage you to take your nausea medication as directed.  If you develop nausea and vomiting that is not controlled by your nausea medication, call the clinic.   BELOW ARE SYMPTOMS THAT SHOULD BE REPORTED IMMEDIATELY:  *FEVER GREATER THAN 100.5 F  *CHILLS WITH OR WITHOUT FEVER  NAUSEA AND VOMITING THAT IS NOT CONTROLLED WITH YOUR NAUSEA MEDICATION  *UNUSUAL SHORTNESS OF BREATH  *UNUSUAL BRUISING OR BLEEDING  TENDERNESS IN MOUTH AND THROAT WITH OR WITHOUT PRESENCE OF ULCERS  *URINARY PROBLEMS  *BOWEL PROBLEMS  UNUSUAL RASH Items with * indicate a potential emergency and should be followed up as soon as possible.  Feel free to call the clinic should you have any questions or concerns. The clinic phone number is [phone].  Please show the [place] at check-in to the Emergency Department and triage nurse.   Hypomagnesemia Hypomagnesemia is a condition in which the level of magnesium in the blood is low. Magnesium is a mineral that is found in many foods. It is used in many different processes  in the body. Hypomagnesemia can affect every organ in the body. In severe cases, it can cause life-threatening problems. What are the causes? This condition may be caused by:  Not getting enough magnesium in your diet.  Malnutrition.  Problems with absorbing magnesium from the intestines.  Dehydration.  Alcohol abuse.  Vomiting.  Severe or chronic diarrhea.  Some medicines, including medicines that make you urinate more (diuretics).  Certain diseases, such as kidney disease, diabetes, celiac disease, and overactive thyroid. What are the signs or symptoms? Symptoms of this condition include:  Loss of appetite.  Nausea and vomiting.  Involuntary shaking or trembling of a body part (tremor).  Muscle weakness.  Tingling in the arms and legs.  Sudden tightening of muscles (muscle spasms).  Confusion.  Psychiatric issues, such as depression, irritability, or psychosis.  A feeling of fluttering of the heart.  Seizures. These symptoms are more severe if magnesium levels drop suddenly. How is this diagnosed? This condition may be diagnosed based on:  Your symptoms and medical history.  A physical exam.  Blood and urine tests. How is this treated? Treatment depends on the cause and the severity of the condition. It may be treated with:  A magnesium supplement. This can be taken in pill form. If the condition is severe, magnesium is usually given through an IV.  Changes to your diet. You may  be directed to eat foods that have a lot of magnesium, such as green leafy vegetables, peas, beans, and nuts.  Stopping any intake of alcohol. Follow these instructions at home:      Make sure that your diet includes foods with magnesium. Foods that have a lot of magnesium in them include: ? Green leafy vegetables, such as spinach and broccoli. ? Beans and peas. ? Nuts and seeds, such as almonds and sunflower seeds. ? Whole grains, such as whole grain bread and fortified  cereals.  Take magnesium supplements if your health care provider tells you to do that. Take them as directed.  Take over-the-counter and prescription medicines only as told by your health care provider.  Have your magnesium levels monitored as told by your health care provider.  When you are active, drink fluids that contain electrolytes.  Avoid drinking alcohol.  Keep all follow-up visits as told by your health care provider. This is important. Contact a health care provider if:  You get worse instead of better.  Your symptoms return. Get help right away if you:  Develop severe muscle weakness.  Have trouble breathing.  Feel that your heart is racing. Summary  Hypomagnesemia is a condition in which the level of magnesium in the blood is low.  Hypomagnesemia can affect every organ in the body.  Treatment may include eating more foods that contain magnesium, taking magnesium supplements, and not drinking alcohol.  Have your magnesium levels monitored as told by your health care provider. This information is not intended to replace advice given to you by your health care provider. Make sure you discuss any questions you have with your health care provider. Document Released: 09/14/2004 Document Revised: 11/20/2016 Document Reviewed: 11/20/2016 Elsevier Interactive Patient Education  2019 [employer].

## 2018-05-27 ENCOUNTER — Encounter: Payer: Self-pay | Admitting: Obstetrics & Gynecology

## 2018-05-27 NOTE — Patient Instructions (Signed)
1. Encounter for routine gynecological examination with Papanicolaou smear of cervix Normal gynecologic exam.  Pap reflex done.  Breast exam normal.  Health labs with family physician.  Body mass index 38.87.  Encouraged to exercise as much as possible in the context of her colon cancer.  Healthy nutrition. - Pap IG w/ reflex to HPV when ASC-U  2. Encounter for surveillance of contraceptive pills Well on Junel FE 1/20.  No contraindication to continue.  Prescription sent to pharmacy.  3. Malignant neoplasm of colon, unspecified part of colon [facility]) Chemotherapy for colon cancer followed by Dr. [name].  Other orders - norethindrone-ethinyl estradiol (LOESTRIN FE) 1-20 MG-MCG tablet; Take 1 tablet by mouth daily.  Brenda Becker, it was a pleasure seeing you today!  I will inform you of your results as soon as they are available.

## 2018-06-02 ENCOUNTER — Other Ambulatory Visit: Payer: Self-pay | Admitting: Oncology

## 2018-06-06 ENCOUNTER — Other Ambulatory Visit: Payer: Self-pay

## 2018-06-06 ENCOUNTER — Inpatient Hospital Stay: Payer: 59 | Attending: Oncology

## 2018-06-06 ENCOUNTER — Inpatient Hospital Stay (HOSPITAL_BASED_OUTPATIENT_CLINIC_OR_DEPARTMENT_OTHER): Payer: 59 | Admitting: Nurse Practitioner

## 2018-06-06 ENCOUNTER — Inpatient Hospital Stay: Payer: 59

## 2018-06-06 ENCOUNTER — Encounter: Payer: Self-pay | Admitting: Nurse Practitioner

## 2018-06-06 ENCOUNTER — Telehealth: Payer: Self-pay | Admitting: *Deleted

## 2018-06-06 VITALS — BP 127/91 | HR 80 | Temp 98.4°F | Resp 18 | Ht 64.5 in | Wt 230.3 lb

## 2018-06-06 DIAGNOSIS — Z1231 Encounter for screening mammogram for malignant neoplasm of breast: Secondary | ICD-10-CM

## 2018-06-06 DIAGNOSIS — Z5112 Encounter for antineoplastic immunotherapy: Secondary | ICD-10-CM | POA: Diagnosis present

## 2018-06-06 DIAGNOSIS — R21 Rash and other nonspecific skin eruption: Secondary | ICD-10-CM | POA: Diagnosis not present

## 2018-06-06 DIAGNOSIS — C184 Malignant neoplasm of transverse colon: Secondary | ICD-10-CM | POA: Insufficient documentation

## 2018-06-06 DIAGNOSIS — C189 Malignant neoplasm of colon, unspecified: Secondary | ICD-10-CM

## 2018-06-06 DIAGNOSIS — Z95828 Presence of other vascular implants and grafts: Secondary | ICD-10-CM

## 2018-06-06 LAB — CBC WITH DIFFERENTIAL (CANCER CENTER ONLY)
Abs Immature Granulocytes: 0.02 10*3/uL (ref 0.00–0.07)
Basophils Absolute: 0.1 10*3/uL (ref 0.0–0.1)
Basophils Relative: 1 %
Eosinophils Absolute: 0.5 10*3/uL (ref 0.0–0.5)
Eosinophils Relative: 7 %
HCT: 40.7 % (ref 36.0–46.0)
Hemoglobin: 13.2 g/dL (ref 12.0–15.0)
Immature Granulocytes: 0 %
Lymphocytes Relative: 36 %
Lymphs Abs: 2.5 10*3/uL (ref 0.7–4.0)
MCH: 28.4 pg (ref 26.0–34.0)
MCHC: 32.4 g/dL (ref 30.0–36.0)
MCV: 87.7 fL (ref 80.0–100.0)
Monocytes Absolute: 0.6 10*3/uL (ref 0.1–1.0)
Monocytes Relative: 8 %
Neutro Abs: 3.4 10*3/uL (ref 1.7–7.7)
Neutrophils Relative %: 48 %
Platelet Count: 244 10*3/uL (ref 150–400)
RBC: 4.64 MIL/uL (ref 3.87–5.11)
RDW: 13.1 % (ref 11.5–15.5)
WBC Count: 7 10*3/uL (ref 4.0–10.5)
nRBC: 0 % (ref 0.0–0.2)

## 2018-06-06 LAB — CMP (CANCER CENTER ONLY)
ALT: 27 U/L (ref 0–44)
AST: 27 U/L (ref 15–41)
Albumin: 3.7 g/dL (ref 3.5–5.0)
Alkaline Phosphatase: 103 U/L (ref 38–126)
Anion gap: 10 (ref 5–15)
BUN: 13 mg/dL (ref 6–20)
CO2: 23 mmol/L (ref 22–32)
Calcium: 9.1 mg/dL (ref 8.9–10.3)
Chloride: 108 mmol/L (ref 98–111)
Creatinine: 1.11 mg/dL — ABNORMAL HIGH (ref 0.44–1.00)
GFR, Est AFR Am: >60 mL/min (ref >60)
GFR, Estimated: 59 mL/min — ABNORMAL LOW (ref >60)
Glucose, Bld: 107 mg/dL — ABNORMAL HIGH (ref 70–99)
Potassium: 4 mmol/L (ref 3.5–5.1)
Sodium: 141 mmol/L (ref 135–145)
Total Bilirubin: 0.3 mg/dL (ref 0.3–1.2)
Total Protein: 7.1 g/dL (ref 6.5–8.1)

## 2018-06-06 LAB — MAGNESIUM: Magnesium: 1.4 mg/dL — CL (ref 1.7–2.4)

## 2018-06-06 MED ORDER — SODIUM CHLORIDE 0.9% FLUSH
10.0000 mL | INTRAVENOUS | Status: DC | PRN
  Administered 2018-06-06: 10 mL
  Filled 2018-06-06: qty 10

## 2018-06-06 MED ORDER — HEPARIN SOD (PORK) LOCK FLUSH 100 UNIT/ML IV SOLN
500.0000 [IU] | Freq: Once | INTRAVENOUS | Status: AC | PRN
  Administered 2018-06-06: 15:00:00 500 [IU]
  Filled 2018-06-06: qty 5

## 2018-06-06 MED ORDER — SODIUM CHLORIDE 0.9 % IV SOLN
Freq: Once | INTRAVENOUS | Status: AC
  Administered 2018-06-06: 12:00:00 via INTRAVENOUS
  Filled 2018-06-06: qty 250

## 2018-06-06 MED ORDER — SODIUM CHLORIDE 0.9 % IV SOLN
Freq: Once | INTRAVENOUS | Status: AC
  Administered 2018-06-06: 11:00:00 via INTRAVENOUS
  Filled 2018-06-06: qty 250

## 2018-06-06 MED ORDER — SODIUM CHLORIDE 0.9 % IV SOLN
6.0000 mg/kg | Freq: Once | INTRAVENOUS | Status: AC
  Administered 2018-06-06: 600 mg via INTRAVENOUS
  Filled 2018-06-06: qty 20

## 2018-06-06 MED ORDER — SODIUM CHLORIDE 0.9% FLUSH
10.0000 mL | Freq: Once | INTRAVENOUS | Status: AC
  Administered 2018-06-06: 10 mL
  Filled 2018-06-06: qty 10

## 2018-06-06 NOTE — Telephone Encounter (Signed)
Received call report from [facility].  "Today's Mg+ = 1.4."  Secure Chat message sent with results.  Further communication through infusion nurse per treatment protocol.

## 2018-06-06 NOTE — Patient Instructions (Signed)

## 2018-06-06 NOTE — Progress Notes (Signed)
VO for Magnesium Sulfate 4 gms over 2 hours entered per request by Ned Card.  Hardie Pulley, PharmD, BCPS, BCOP

## 2018-06-06 NOTE — Progress Notes (Signed)
CMP review per Ned Card, NP: OK to treat with low Mg+ today. Will order IV Mg+

## 2018-06-06 NOTE — Progress Notes (Signed)
[place] OFFICE PROGRESS NOTE   Diagnosis: Colon cancer  INTERVAL HISTORY:   Brenda Becker returns as scheduled.  She continues every 2-week Panitumumab.  Skin rash continues to be improved overall.  She notes the rash increases when she has to wear a mask.  She denies nausea/vomiting.  No mouth sores.  No diarrhea.  She has good appetite.  No abdominal pain.  Objective:  Vital signs in last 24 hours:  Blood pressure (!) 127/91, pulse 80, temperature 98.4 F (36.9 C), temperature source Oral, resp. rate 18, height 5' 4.5" (1.638 m), weight 230 lb 4.8 oz (104.5 kg), SpO2 99 %.    Vascular: No leg edema. Skin: Acne type rash over the forehead, malar regions and upper chest. Port-A-Cath without erythema.  Lab Results:  Lab Results  Component Value Date   WBC 6.0 04/11/2018   HGB 12.9 04/11/2018   HCT 39.9 04/11/2018   MCV 87.5 04/11/2018   PLT 231 04/11/2018   NEUTROABS 2.7 04/11/2018    Imaging:  No results found.  Medications: I have reviewed the patient's current medications.  Assessment/Plan: 1.  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  Cycle 2 CAPOX 01/22/2015  Cycle 3 CAPOX 02/12/2015  Cycle 4 CAPOX 03/05/2015  Cycle 5 CAPOX 03/26/2015  Cycle 6 CAPOX 04/16/2015  Cycle 7 CAPOX 05/07/2015 (oxaliplatin held due to progressive neuropathy)  Cycle 8 CAPOX 05/28/2015 (oxaliplatin held secondary to neuropathy)  CTs 11/01/2015-no significant change and an 11 mm upper abdomen mesenteric lymph node with partial calcification-treated metastasis?, No other evidence of metastatic disease  CTs 01/21/2016-mild interval progression of previously identified  calcified nodal tissue in the upper central abdomen. Development of small adjacent lymph nodes in the interval.  EUS guided biopsy of the calcified upper abdominal mass on 02/17/2016 confirmed metastatic adenocarcinoma  Cycle 1 FOLFIRI/Avastin 03/09/2016  Cycle 2 FOLFIRI/Avastin 03/23/2016  Cycle 3 FOLFIRI/Avastin 04/06/2016 (Neulasta added)  Cycle 4 FOLFIRI/Avastin 04/20/2016  Cycle 5 FOLFIRI/Avastin 05/04/2016  Cycle 6 FOLFIRI/Avastin 05/18/2016  CT abdomen/pelvis 05/30/2016-new retroperitoneal lymphadenopathy in the para-aortic region with the largest node measuring 1.5 cm. Partially calcified mesenteric lymph node measures 12 mm as compared to 14 mm previously. Other scattered less than 1 cmmesenteric lymph nodes remain stable.  PET scan 06/13/2016-hypermetabolic posterior mediastinal node, hypermetabolic retroperitoneal lymph nodes, stable transverse mesocolon mass, bilateral hypermetabolic inguinal nodes  EUS biopsy of posterior mediastinal lymph nodes on 07/20/2016-positive for metastatic adenocarcinoma  Clinical trial at [facility], [id number] Panitumumab, nivolumab, ipilimumab.  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 No change in small calcified mesenteric and retroperitoneal abdominal lymph nodes. No evidence of metastatic disease in the chest.  Maintenance panitumumab beginning 03/29/2017, healed 05/24/2017 secondary to elevated liver enzymes-resumed 06/07/2017  [facility]s at [facility] 06/04/2017-no evidence of disease progression, unchanged scattered mesenteric/retroperitoneal lymph nodes  Panitumumab every 2 weeks  Restaging CTs 08/27/2017-no evidence of  metastatic disease in the chest;unchanged size and appearance of scattered calcified and noncalcified mesenteric and retroperitoneal lymph nodes. Near complete resolution of previously described haziness  within the central mesentery. No new evidence of recurrent or metastatic disease within the abdomen or pelvis.  Continue Panitumumab every 2 weeks  Restaging CTs 01/08/2018- stable lymph nodes within the posterior mediastinum, retroperitoneum and transverse mesocolon  Continue Panitumumab every 2 weeks 2. History of Microcytic anemia-likely iron deficiency anemia secondary to #1 and ongoing menses 3. Status post cholecystectomy 4. Family history of breast and ovarian cancer. She has seen the genetics counselor. 5. Delayed nausea following cycle 1 CAPOX. Aloxi and Emend added beginning with cycle 2. 6. Transient episodes of dyspnea following cycle 4 and cycle 5 CAPOX, likely neurotoxicity from oxaliplatin 7. Oxaliplatin neuropathy-improved 8. Mild neutropenia secondary to chemotherapy-Neulasta added beginning with cycle 3 FOLFIRI/Avastin 9. Mild mucositis following cycle 4 FOLFIRI/Avastin 10. Mammogram 05/11/2016-possible mass right breast. Right breast ultrasound 05/17/2016-right breast mass at 10:00 most likely represents a cyst, however is somewhat hypoechoic and could also represent a fibroadenoma. Ultrasound-guided aspiration of a 1 cmbenign cyst 05/22/2016. 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 12. Hypomagnesia secondary to Panitumumab. She continues magnesium oxide. 13. Elevated liver enzymes May 2019-likely secondary to resuming atorvastatin, now normal 14. History of paronychia related to Panitumumab.   Disposition: Brenda Becker appears stable.  There is no clinical evidence of disease progression.  The plan is to continue every 2-week Panitumumab.  She will return for Panitumumab in 2 weeks.  She will undergo restaging CTs around 07/08/2018 and  return for a follow-up visit on 07/11/2018.  She will contact the office in the interim with any problems.    [name]   06/06/2018  10:24 AM

## 2018-06-06 NOTE — Patient Instructions (Signed)
[place] Discharge Instructions for Patients Receiving Chemotherapy  Today you received the following chemotherapy agents Panitumumab (Vectibix).  To help prevent nausea and vomiting after your treatment, we encourage you to take your nausea medication as prescribed.   If you develop nausea and vomiting that is not controlled by your nausea medication, call the clinic.   BELOW ARE SYMPTOMS THAT SHOULD BE REPORTED IMMEDIATELY:  *FEVER GREATER THAN 100.5 F  *CHILLS WITH OR WITHOUT FEVER  NAUSEA AND VOMITING THAT IS NOT CONTROLLED WITH YOUR NAUSEA MEDICATION  *UNUSUAL SHORTNESS OF BREATH  *UNUSUAL BRUISING OR BLEEDING  TENDERNESS IN MOUTH AND THROAT WITH OR WITHOUT PRESENCE OF ULCERS  *URINARY PROBLEMS  *BOWEL PROBLEMS  UNUSUAL RASH Items with * indicate a potential emergency and should be followed up as soon as possible.  Feel free to call the clinic should you have any questions or concerns. The clinic phone number is [phone].  Please show the [place] at check-in to the Emergency Department and triage nurse.  Magnesium Sulfate injection What is this medicine? MAGNESIUM SULFATE (mag NEE zee um SUL fate) is an electrolyte injection commonly used to treat low magnesium levels in your blood. It is also used to prevent or control seizures in women with preeclampsia or eclampsia. This medicine may be used for other purposes; ask your health care provider or pharmacist if you have questions. What should I tell my health care provider before I take this medicine? They need to know if you have any of these conditions: -heart disease -history of irregular heart beat -kidney disease -an unusual or allergic reaction to magnesium sulfate, medicines, foods, dyes, or preservatives -pregnant or trying to get pregnant -breast-feeding How should I use this medicine? This medicine is for infusion into a vein. It is given by a health care professional in a  hospital or clinic setting. Talk to your pediatrician regarding the use of this medicine in children. While this drug may be prescribed for selected conditions, precautions do apply. Overdosage: If you think you have taken too much of this medicine contact a poison control center or emergency room at once. NOTE: This medicine is only for you. Do not share this medicine with others. What if I miss a dose? This does not apply. What may interact with this medicine? This medicine may interact with the following medications: -certain medicines for anxiety or sleep -certain medicines for seizures like phenobarbital -digoxin -medicines that relax muscles for surgery -narcotic medicines for pain This list may not describe all possible interactions. Give your health care provider a list of all the medicines, herbs, non-prescription drugs, or dietary supplements you use. Also tell them if you smoke, drink alcohol, or use illegal drugs. Some items may interact with your medicine. What should I watch for while using this medicine? Your condition will be monitored carefully while you are receiving this medicine. You may need blood work done while you are receiving this medicine. What side effects may I notice from receiving this medicine? Side effects that you should report to your doctor or health care professional as soon as possible: -allergic reactions like skin rash, itching or hives, swelling of the face, lips, or tongue -facial flushing -muscle weakness -signs and symptoms of low blood pressure like dizziness; feeling faint or lightheaded, falls; unusually weak or tired -signs and symptoms of a dangerous change in heartbeat or heart rhythm like chest pain; dizziness; fast or irregular heartbeat; palpitations; breathing problems -sweating This list may  not describe all possible side effects. Call your doctor for medical advice about side effects. You may report side effects to FDA at  1-800-FDA-1088. Where should I keep my medicine? This drug is given in a hospital or clinic and will not be stored at home. NOTE: This sheet is a summary. It may not cover all possible information. If you have questions about this medicine, talk to your doctor, pharmacist, or health care provider.  2019 Elsevier/Gold Standard (2015-07-07 12:31:42)  Coronavirus (COVID-19) Are you at risk?  Are you at risk for the Coronavirus (COVID-19)?  To be considered HIGH RISK for Coronavirus (COVID-19), you have to meet the following criteria:  . Traveled to [place], [place], [place], [place] or [place]; or in the [place] to [place], [place], [place], or [place]; and have fever, cough, and shortness of breath within the last 2 weeks of travel OR . Been in close contact with a person diagnosed with COVID-19 within the last 2 weeks and have fever, cough, and shortness of breath . IF YOU DO NOT MEET THESE CRITERIA, YOU ARE CONSIDERED LOW RISK FOR COVID-19.  What to do if you are HIGH RISK for COVID-19?  Marland Kitchen If you are having a medical emergency, call 911. . Seek medical care right away. Before you go to a doctor's office, urgent care or emergency department, call ahead and tell them about your recent travel, contact with someone diagnosed with COVID-19, and your symptoms. You should receive instructions from your physician's office regarding next steps of care.  . When you arrive at healthcare provider, tell the healthcare staff immediately you have returned from visiting [place], [place], [place], [place] or [place]; or traveled in the [place] to [place], [place], [place], or [place]; in the last two weeks or you have been in close contact with a person diagnosed with COVID-19 in the last 2 weeks.   . Tell the health care staff about your symptoms: fever, cough and shortness of breath. . After you have been seen by a medical provider, you will be either: o Tested for (COVID-19) and  discharged home on quarantine except to seek medical care if symptoms worsen, and asked to  - Stay home and avoid contact with others until you get your results (4-5 days)  - Avoid travel on public transportation if possible (such as bus, train, or airplane) or o Sent to the Emergency Department by EMS for evaluation, COVID-19 testing, and possible admission depending on your condition and test results.  What to do if you are LOW RISK for COVID-19?  Reduce your risk of any infection by using the same precautions used for avoiding the common cold or flu:  Marland Kitchen Wash your hands often with soap and warm water for at least 20 seconds.  If soap and water are not readily available, use an alcohol-based hand sanitizer with at least 60% alcohol.  . If coughing or sneezing, cover your mouth and nose by coughing or sneezing into the elbow areas of your shirt or coat, into a tissue or into your sleeve (not your hands). . Avoid shaking hands with others and consider head nods or verbal greetings only. . Avoid touching your eyes, nose, or mouth with unwashed hands.  . Avoid close contact with people who are sick. . Avoid places or events with large numbers of people in one location, like concerts or sporting events. . Carefully consider travel plans you have or are making. . If you  are planning any travel outside or inside the [place], visit the CDC's Travelers' Health webpage for the latest health notices. . If you have some symptoms but not all symptoms, continue to monitor at home and seek medical attention if your symptoms worsen. . If you are having a medical emergency, call 911.   [place] / e-Visit: [email or web address]         [facility]: [facility]: [phone]                   [facility]: [phone]

## 2018-06-07 ENCOUNTER — Telehealth: Payer: Self-pay | Admitting: Nurse Practitioner

## 2018-06-07 NOTE — Telephone Encounter (Signed)
Scheduled appt per 6/4 los, was not able to reach the patient.

## 2018-06-18 ENCOUNTER — Other Ambulatory Visit: Payer: Self-pay | Admitting: Oncology

## 2018-06-20 ENCOUNTER — Other Ambulatory Visit: Payer: Self-pay

## 2018-06-20 ENCOUNTER — Inpatient Hospital Stay: Payer: 59

## 2018-06-20 VITALS — BP 142/82 | HR 85 | Temp 98.9°F | Resp 18

## 2018-06-20 DIAGNOSIS — Z95828 Presence of other vascular implants and grafts: Secondary | ICD-10-CM

## 2018-06-20 DIAGNOSIS — Z5112 Encounter for antineoplastic immunotherapy: Secondary | ICD-10-CM | POA: Diagnosis not present

## 2018-06-20 DIAGNOSIS — C189 Malignant neoplasm of colon, unspecified: Secondary | ICD-10-CM

## 2018-06-20 DIAGNOSIS — C184 Malignant neoplasm of transverse colon: Secondary | ICD-10-CM

## 2018-06-20 LAB — CMP (CANCER CENTER ONLY)
ALT: 25 U/L (ref 0–44)
AST: 21 U/L (ref 15–41)
Albumin: 3.5 g/dL (ref 3.5–5.0)
Alkaline Phosphatase: 89 U/L (ref 38–126)
Anion gap: 8 (ref 5–15)
BUN: 9 mg/dL (ref 6–20)
CO2: 24 mmol/L (ref 22–32)
Calcium: 9 mg/dL (ref 8.9–10.3)
Chloride: 110 mmol/L (ref 98–111)
Creatinine: 1.1 mg/dL — ABNORMAL HIGH (ref 0.44–1.00)
GFR, Est AFR Am: >60 mL/min (ref >60)
GFR, Estimated: 59 mL/min — ABNORMAL LOW (ref >60)
Glucose, Bld: 118 mg/dL — ABNORMAL HIGH (ref 70–99)
Potassium: 3.7 mmol/L (ref 3.5–5.1)
Sodium: 142 mmol/L (ref 135–145)
Total Bilirubin: 0.3 mg/dL (ref 0.3–1.2)
Total Protein: 6.8 g/dL (ref 6.5–8.1)

## 2018-06-20 LAB — MAGNESIUM: Magnesium: 1.3 mg/dL — CL (ref 1.7–2.4)

## 2018-06-20 MED ORDER — SODIUM CHLORIDE 0.9 % IV SOLN
6.0000 mg/kg | Freq: Once | INTRAVENOUS | Status: AC
  Administered 2018-06-20: 600 mg via INTRAVENOUS
  Filled 2018-06-20: qty 20

## 2018-06-20 MED ORDER — SODIUM CHLORIDE 0.9 % IV SOLN
Freq: Once | INTRAVENOUS | Status: AC
  Administered 2018-06-20: 11:00:00 via INTRAVENOUS
  Filled 2018-06-20: qty 250

## 2018-06-20 MED ORDER — SODIUM CHLORIDE 0.9% FLUSH
10.0000 mL | INTRAVENOUS | Status: DC | PRN
  Administered 2018-06-20: 10 mL
  Filled 2018-06-20: qty 10

## 2018-06-20 MED ORDER — SODIUM CHLORIDE 0.9% FLUSH
10.0000 mL | Freq: Once | INTRAVENOUS | Status: AC
  Administered 2018-06-20: 10 mL
  Filled 2018-06-20: qty 10

## 2018-06-20 MED ORDER — HEPARIN SOD (PORK) LOCK FLUSH 100 UNIT/ML IV SOLN
500.0000 [IU] | Freq: Once | INTRAVENOUS | Status: AC | PRN
  Administered 2018-06-20: 13:00:00 500 [IU]
  Filled 2018-06-20: qty 5

## 2018-06-20 NOTE — Patient Instructions (Signed)
[place] Discharge Instructions for Patients Receiving Chemotherapy  Today you received the following chemotherapy agent: Panitumumab (Vectibix).  To help prevent nausea and vomiting after your treatment, we encourage you to take your nausea medication as prescribed.   If you develop nausea and vomiting that is not controlled by your nausea medication, call the clinic.   BELOW ARE SYMPTOMS THAT SHOULD BE REPORTED IMMEDIATELY:  *FEVER GREATER THAN 100.5 F  *CHILLS WITH OR WITHOUT FEVER  NAUSEA AND VOMITING THAT IS NOT CONTROLLED WITH YOUR NAUSEA MEDICATION  *UNUSUAL SHORTNESS OF BREATH  *UNUSUAL BRUISING OR BLEEDING  TENDERNESS IN MOUTH AND THROAT WITH OR WITHOUT PRESENCE OF ULCERS  *URINARY PROBLEMS  *BOWEL PROBLEMS  UNUSUAL RASH Items with * indicate a potential emergency and should be followed up as soon as possible.  Feel free to call the clinic should you have any questions or concerns. The clinic phone number is [phone].  Please show the [place] at check-in to the Emergency Department and triage nurse.

## 2018-06-20 NOTE — Progress Notes (Signed)
OK to treat today with magnesium 1.3 per MD Sherrill, no IV mag to be ordered at this time.  Pt's next appt will be longer to accommodate for low mag levels so she can receive IV mag.  Pt VU of this and to continue with oral magnesium at home.

## 2018-07-04 ENCOUNTER — Ambulatory Visit: Payer: 59 | Admitting: Oncology

## 2018-07-04 ENCOUNTER — Other Ambulatory Visit: Payer: 59

## 2018-07-04 ENCOUNTER — Ambulatory Visit: Payer: 59

## 2018-07-07 ENCOUNTER — Other Ambulatory Visit: Payer: Self-pay | Admitting: Oncology

## 2018-07-08 ENCOUNTER — Ambulatory Visit (HOSPITAL_COMMUNITY)
Admission: RE | Admit: 2018-07-08 | Discharge: 2018-07-08 | Disposition: A | Discharge status: Home / Self Care | Payer: 59 | Source: Ambulatory Visit | Attending: Nurse Practitioner | Admitting: Nurse Practitioner

## 2018-07-08 ENCOUNTER — Other Ambulatory Visit: Payer: Self-pay

## 2018-07-08 DIAGNOSIS — C184 Malignant neoplasm of transverse colon: Secondary | ICD-10-CM | POA: Diagnosis not present

## 2018-07-08 MED ORDER — SODIUM CHLORIDE (PF) 0.9 % IJ SOLN
INTRAMUSCULAR | Status: AC
  Filled 2018-07-08: qty 50

## 2018-07-08 MED ORDER — IOHEXOL 300 MG/ML  SOLN
100.0000 mL | Freq: Once | INTRAMUSCULAR | Status: AC | PRN
  Administered 2018-07-08: 10:00:00 100 mL via INTRAVENOUS

## 2018-07-08 MED ORDER — IOHEXOL 300 MG/ML  SOLN
30.0000 mL | Freq: Once | INTRAMUSCULAR | Status: AC | PRN
  Administered 2018-07-08: 08:00:00 30 mL via ORAL

## 2018-07-10 ENCOUNTER — Other Ambulatory Visit: Payer: Self-pay | Admitting: Oncology

## 2018-07-10 ENCOUNTER — Ambulatory Visit
Admission: RE | Admit: 2018-07-10 | Discharge: 2018-07-10 | Disposition: A | Discharge status: Home / Self Care | Payer: 59 | Source: Ambulatory Visit | Attending: Obstetrics & Gynecology | Admitting: Obstetrics & Gynecology

## 2018-07-10 ENCOUNTER — Other Ambulatory Visit: Payer: Self-pay

## 2018-07-11 ENCOUNTER — Inpatient Hospital Stay: Payer: 59

## 2018-07-11 ENCOUNTER — Inpatient Hospital Stay: Payer: 59 | Attending: Oncology

## 2018-07-11 ENCOUNTER — Inpatient Hospital Stay (HOSPITAL_BASED_OUTPATIENT_CLINIC_OR_DEPARTMENT_OTHER): Payer: 59 | Admitting: Oncology

## 2018-07-11 ENCOUNTER — Other Ambulatory Visit: Payer: Self-pay

## 2018-07-11 VITALS — BP 131/86 | HR 87 | Temp 98.2°F | Resp 18 | Ht 64.5 in | Wt 228.2 lb

## 2018-07-11 DIAGNOSIS — C184 Malignant neoplasm of transverse colon: Secondary | ICD-10-CM

## 2018-07-11 DIAGNOSIS — Z95828 Presence of other vascular implants and grafts: Secondary | ICD-10-CM

## 2018-07-11 DIAGNOSIS — Z5112 Encounter for antineoplastic immunotherapy: Secondary | ICD-10-CM | POA: Diagnosis not present

## 2018-07-11 DIAGNOSIS — C189 Malignant neoplasm of colon, unspecified: Secondary | ICD-10-CM

## 2018-07-11 DIAGNOSIS — R21 Rash and other nonspecific skin eruption: Secondary | ICD-10-CM | POA: Diagnosis not present

## 2018-07-11 LAB — CMP (CANCER CENTER ONLY)
ALT: 27 U/L (ref 0–44)
AST: 23 U/L (ref 15–41)
Albumin: 3.6 g/dL (ref 3.5–5.0)
Alkaline Phosphatase: 90 U/L (ref 38–126)
Anion gap: 11 (ref 5–15)
BUN: 9 mg/dL (ref 6–20)
CO2: 22 mmol/L (ref 22–32)
Calcium: 9 mg/dL (ref 8.9–10.3)
Chloride: 110 mmol/L (ref 98–111)
Creatinine: 1.05 mg/dL — ABNORMAL HIGH (ref 0.44–1.00)
GFR, Est AFR Am: >60 mL/min (ref >60)
GFR, Estimated: >60 mL/min (ref >60)
Glucose, Bld: 125 mg/dL — ABNORMAL HIGH (ref 70–99)
Potassium: 4 mmol/L (ref 3.5–5.1)
Sodium: 143 mmol/L (ref 135–145)
Total Bilirubin: 0.3 mg/dL (ref 0.3–1.2)
Total Protein: 7.1 g/dL (ref 6.5–8.1)

## 2018-07-11 LAB — MAGNESIUM: Magnesium: 1.3 mg/dL — CL (ref 1.7–2.4)

## 2018-07-11 MED ORDER — SODIUM CHLORIDE 0.9 % IV SOLN
Freq: Once | INTRAVENOUS | Status: AC
  Administered 2018-07-11: 11:00:00 via INTRAVENOUS
  Filled 2018-07-11: qty 250

## 2018-07-11 MED ORDER — POTASSIUM CHLORIDE CRYS ER 20 MEQ PO TBCR: EXTENDED_RELEASE_TABLET | ORAL | 1 refills | Status: DC

## 2018-07-11 MED ORDER — SODIUM CHLORIDE 0.9% FLUSH
10.0000 mL | Freq: Once | INTRAVENOUS | Status: AC
  Administered 2018-07-11: 10:00:00 10 mL
  Filled 2018-07-11: qty 10

## 2018-07-11 MED ORDER — HEPARIN SOD (PORK) LOCK FLUSH 100 UNIT/ML IV SOLN
500.0000 [IU] | Freq: Once | INTRAVENOUS | Status: AC | PRN
  Administered 2018-07-11: 500 [IU]
  Filled 2018-07-11: qty 5

## 2018-07-11 MED ORDER — SODIUM CHLORIDE 0.9% FLUSH
10.0000 mL | INTRAVENOUS | Status: DC | PRN
  Administered 2018-07-11: 14:00:00 10 mL
  Filled 2018-07-11: qty 10

## 2018-07-11 MED ORDER — SODIUM CHLORIDE 0.9 % IV SOLN
6.0000 mg/kg | Freq: Once | INTRAVENOUS | Status: AC
  Administered 2018-07-11: 13:00:00 600 mg via INTRAVENOUS
  Filled 2018-07-11: qty 20

## 2018-07-11 MED ORDER — CLINDAMYCIN PHOSPHATE 1 % EX SOLN: CUTANEOUS | 1 refills | Status: DC

## 2018-07-11 MED ORDER — SODIUM CHLORIDE 0.9 % IV SOLN
3.0000 g | Freq: Once | INTRAVENOUS | Status: AC
  Administered 2018-07-11: 11:00:00 3 g via INTRAVENOUS
  Filled 2018-07-11: qty 6

## 2018-07-11 NOTE — Progress Notes (Signed)
[facility] OFFICE PROGRESS NOTE   Diagnosis: Colon cancer  INTERVAL HISTORY:   Ms. Kiner returns as scheduled.  She continues every 2-week panitumumab.  Persistent skin rash.  No new complaint.  She is working.  Objective:  Vital signs in last 24 hours:  Blood pressure 131/86, pulse 87, temperature 98.2 F (36.8 C), temperature source Oral, resp. rate 18, height 5' 4.5" (1.638 m), weight 228 lb 3.2 oz (103.5 kg), SpO2 99 %.   Limited physical examination secondary to distancing with the COVID pandemic GI: No hepatomegaly, nontender Vascular: No leg edema  Skin: Acne type rash over the trunk, face, and extremities  Portacath/PICC-without erythema  Lab Results:  Lab Results  Component Value Date   WBC 7.0 06/06/2018   HGB 13.2 06/06/2018   HCT 40.7 06/06/2018   MCV 87.7 06/06/2018   PLT 244 06/06/2018   NEUTROABS 3.4 06/06/2018    CMP  Lab Results  Component Value Date   NA 143 07/11/2018   K 4.0 07/11/2018   CL 110 07/11/2018   CO2 22 07/11/2018   GLUCOSE 125 (H) 07/11/2018   BUN 9 07/11/2018   CREATININE 1.05 (H) 07/11/2018   CALCIUM 9.0 07/11/2018   PROT 7.1 07/11/2018   ALBUMIN 3.6 07/11/2018   AST 23 07/11/2018   ALT 27 07/11/2018   ALKPHOS 90 07/11/2018   BILITOT 0.3 07/11/2018   GFRNONAA >60 07/11/2018   GFRAA >60 07/11/2018    Lab Results  Component Value Date   CEA1 <1.00 11/22/2017    Lab Results  Component Value Date   INR 0.94 12/22/2014    Imaging:  Ct Chest W Contrast  Result Date: 07/08/2018 CLINICAL DATA:  Restaging colon cancer diagnosed in 2016. History of colon resection. Chemotherapy ongoing. No current complaints. EXAM: CT CHEST, ABDOMEN, AND PELVIS WITH CONTRAST TECHNIQUE: Multidetector CT imaging of the chest, abdomen and pelvis was performed following the standard protocol during bolus administration of intravenous contrast. CONTRAST:  148m OMNIPAQUE IOHEXOL 300 MG/ML  SOLN COMPARISON:  CTs 01/01/2018 and  11/01/2015. FINDINGS: CT CHEST FINDINGS Cardiovascular: No significant vascular findings. Right IJ Port-A-Cath extends to the superior cavoatrial junction. The heart size is normal. There is no pericardial effusion. Mediastinum/Nodes: There are no enlarged mediastinal, hilar, axillary or internal mammary lymph nodes. The thyroid gland, trachea and esophagus demonstrate no significant findings. Lungs/Pleura: There is no pleural effusion or pneumothorax. The lungs are clear. There are no suspicious pulmonary nodules. Stable air cyst formation in the lingula. Musculoskeletal/Chest wall: No chest wall mass or suspicious osseous findings. Stable right breast calcifications. CT ABDOMEN AND PELVIS FINDINGS Hepatobiliary: The liver is normal in density without suspicious focal abnormality. No biliary dilatation post cholecystectomy. Pancreas: Unremarkable. No pancreatic ductal dilatation or surrounding inflammatory changes. Spleen: Normal in size without focal abnormality. Adrenals/Urinary Tract: Both adrenal glands appear normal. The kidneys, ureters and bladder appear normal. No evidence of urinary tract calculus. Stomach/Bowel: No evidence of bowel wall thickening, distention or surrounding inflammatory change. Stable mid transverse colon anastomosis. There is moderate stool throughout the colon. The appendix appears normal. Vascular/Lymphatic: There are no enlarged abdominal or pelvic lymph nodes. Previously demonstrated retroperitoneal and mesenteric lymph nodes have decreased in size and are now calcified. Stable mild aortic and branch vessel atherosclerosis. No acute vascular findings. Reproductive: Stable probable fundal fibroid or adenomyosis. No adnexal mass. Other: No ascites or peritoneal nodularity. Musculoskeletal: No acute or significant osseous findings. IMPRESSION: 1. No evidence of metastatic disease status post resection of mid  transverse colon lesion. Previously demonstrated small retroperitoneal and  mesenteric lymph nodes are now calcified. 2. No acute chest findings. 3.  Mild abdominal aortic Atherosclerosis (ICD10-I70.0). Electronically Signed   By: Richardean Sale M.D.   On: 07/08/2018 11:40   Ct Abdomen Pelvis W Contrast  Result Date: 07/08/2018 CLINICAL DATA:  Restaging colon cancer diagnosed in 2016. History of colon resection. Chemotherapy ongoing. No current complaints. EXAM: CT CHEST, ABDOMEN, AND PELVIS WITH CONTRAST TECHNIQUE: Multidetector CT imaging of the chest, abdomen and pelvis was performed following the standard protocol during bolus administration of intravenous contrast. CONTRAST:  163m OMNIPAQUE IOHEXOL 300 MG/ML  SOLN COMPARISON:  CTs 01/01/2018 and 11/01/2015. FINDINGS: CT CHEST FINDINGS Cardiovascular: No significant vascular findings. Right IJ Port-A-Cath extends to the superior cavoatrial junction. The heart size is normal. There is no pericardial effusion. Mediastinum/Nodes: There are no enlarged mediastinal, hilar, axillary or internal mammary lymph nodes. The thyroid gland, trachea and esophagus demonstrate no significant findings. Lungs/Pleura: There is no pleural effusion or pneumothorax. The lungs are clear. There are no suspicious pulmonary nodules. Stable air cyst formation in the lingula. Musculoskeletal/Chest wall: No chest wall mass or suspicious osseous findings. Stable right breast calcifications. CT ABDOMEN AND PELVIS FINDINGS Hepatobiliary: The liver is normal in density without suspicious focal abnormality. No biliary dilatation post cholecystectomy. Pancreas: Unremarkable. No pancreatic ductal dilatation or surrounding inflammatory changes. Spleen: Normal in size without focal abnormality. Adrenals/Urinary Tract: Both adrenal glands appear normal. The kidneys, ureters and bladder appear normal. No evidence of urinary tract calculus. Stomach/Bowel: No evidence of bowel wall thickening, distention or surrounding inflammatory change. Stable mid transverse colon  anastomosis. There is moderate stool throughout the colon. The appendix appears normal. Vascular/Lymphatic: There are no enlarged abdominal or pelvic lymph nodes. Previously demonstrated retroperitoneal and mesenteric lymph nodes have decreased in size and are now calcified. Stable mild aortic and branch vessel atherosclerosis. No acute vascular findings. Reproductive: Stable probable fundal fibroid or adenomyosis. No adnexal mass. Other: No ascites or peritoneal nodularity. Musculoskeletal: No acute or significant osseous findings. IMPRESSION: 1. No evidence of metastatic disease status post resection of mid transverse colon lesion. Previously demonstrated small retroperitoneal and mesenteric lymph nodes are now calcified. 2. No acute chest findings. 3.  Mild abdominal aortic Atherosclerosis (ICD10-I70.0). Electronically Signed   By: WRichardean SaleM.D.   On: 07/08/2018 11:40   Mm 3d Screen Breast Bilateral  Result Date: 07/10/2018 CLINICAL DATA:  Screening. EXAM: DIGITAL SCREENING BILATERAL MAMMOGRAM WITH TOMO AND CAD COMPARISON:  Previous exam(s). ACR Breast Density Category b: There are scattered areas of fibroglandular density. FINDINGS: There are no findings suspicious for malignancy. Images were processed with CAD. IMPRESSION: No mammographic evidence of malignancy. A result letter of this screening mammogram will be mailed directly to the patient. RECOMMENDATION: Screening mammogram in one year. (Code:[id number]) BI-RADS CATEGORY  1: Negative. Electronically Signed   By: SCurlene DolphinM.D.   On: 07/10/2018 16:34    Medications: I have reviewed the patient's current medications.   Assessment/Plan: 1.  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  Cycle 2 CAPOX 01/22/2015  Cycle 3 CAPOX 02/12/2015  Cycle 4 CAPOX 03/05/2015  Cycle 5 CAPOX 03/26/2015  Cycle 6 CAPOX 04/16/2015  Cycle 7 CAPOX 05/07/2015 (oxaliplatin held due to progressive  neuropathy)  Cycle 8 CAPOX 05/28/2015 (oxaliplatin held secondary to neuropathy)  CTs 11/01/2015-no significant change and an 11 mm upper abdomen mesenteric lymph node with partial calcification-treated metastasis?, No other evidence of metastatic disease  CTs 01/21/2016-mild interval progression of previously identified calcified nodal tissue in the upper central abdomen. Development of small adjacent lymph nodes in the interval.  EUS guided biopsy of the calcified upper abdominal mass on 02/17/2016 confirmed metastatic adenocarcinoma  Cycle 1 FOLFIRI/Avastin 03/09/2016  Cycle 2 FOLFIRI/Avastin 03/23/2016  Cycle 3 FOLFIRI/Avastin 04/06/2016 (Neulasta added)  Cycle 4 FOLFIRI/Avastin 04/20/2016  Cycle 5 FOLFIRI/Avastin 05/04/2016  Cycle 6 FOLFIRI/Avastin 05/18/2016  CT abdomen/pelvis 05/30/2016-new retroperitoneal lymphadenopathy in the para-aortic region with the largest node measuring 1.5 cm. Partially calcified mesenteric lymph node measures 12 mm as compared to 14 mm previously. Other scattered less than 1 cmmesenteric lymph nodes remain stable.  PET scan 06/13/2016-hypermetabolic posterior mediastinal node, hypermetabolic retroperitoneal lymph nodes, stable transverse mesocolon mass, bilateral hypermetabolic inguinal nodes  EUS biopsy of posterior mediastinal lymph nodes on 07/20/2016-positive for metastatic adenocarcinoma  Clinical trial at [id number] Panitumumab, nivolumab, ipilimumab.  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 No change in small calcified mesenteric and retroperitoneal abdominal lymph nodes. No evidence of metastatic disease in the chest.  Maintenance panitumumab beginning 03/29/2017, healed 05/24/2017 secondary to elevated liver enzymes-resumed 06/07/2017  [facility]aging [facility] at [facility] 06/04/2017-no evidence of disease progression, unchanged scattered mesenteric/retroperitoneal lymph nodes  Panitumumab every 2 weeks  Restaging CTs 08/27/2017-no evidence of metastatic disease in the chest;unchanged size and appearance of scattered calcified and noncalcified mesenteric and retroperitoneal lymph nodes. Near complete resolution of previously described haziness within the central mesentery. No new evidence of recurrent or metastatic disease within the abdomen or pelvis.  Continue Panitumumab every 2 weeks  Restaging CTs 01/01/2018-stable lymph nodes within the posterior mediastinum, retroperitoneum and transverse mesocolon  Continue Panitumumab every 2 weeks  Restaging CT 07/08/2018- no evidence of metastatic disease, we noted small retroperitoneal/mesenteric nodes are calcified  Every 2-week panitumumab continued 2. History of Microcytic anemia-likely iron deficiency anemia secondary to #1 and ongoing menses 3. Status post cholecystectomy 4. Family history of breast and ovarian cancer. She has seen the genetics counselor. 5. Delayed nausea following cycle 1 CAPOX. Aloxi and Emend added beginning with cycle 2. 6. Transient episodes of dyspnea following cycle 4 and cycle 5 CAPOX, likely neurotoxicity from oxaliplatin 7. Oxaliplatin neuropathy-improved 8. Mild neutropenia secondary to chemotherapy-Neulasta added beginning with cycle 3 FOLFIRI/Avastin 9. Mild mucositis following cycle 4 FOLFIRI/Avastin 10. Mammogram 05/11/2016-possible mass right breast. Right breast ultrasound  05/17/2016-right breast mass at 10:00 most likely represents a cyst, however is somewhat hypoechoic and could also represent a fibroadenoma. Ultrasound-guided aspiration of a 1 cmbenign cyst 05/22/2016. 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 12. Hypomagnesia secondary to Panitumumab. She continues magnesium oxide. 13. Elevated liver enzymes May 2019-likely secondary to resuming atorvastatin, now normal 14. History of paronychia related to Panitumumab.     Disposition: Ms. Pla appears stable.  She is in clinical remission from colon cancer.  She has been maintained on single agent Panitumumab since March 2019.  Small mesenteric/retroperitoneal lymph nodes are calcified or resolved.  The plan is to continue Panitumumab on a 2-week schedule.  I  will consult with Dr. Andree Elk regarding the indication to continue Panitumumab.  Ms. Avery will return for an office visit in 1 month.  I reviewed the CT images.  We continue to replete with IV magnesium as needed.  Betsy Coder, MD  07/11/2018  1:44 PM

## 2018-07-11 NOTE — Patient Instructions (Addendum)
[facility] Discharge Instructions for Patients Receiving Chemotherapy  Today you received the following chemotherapy agents: Vectibix  To help prevent nausea and vomiting after your treatment, we encourage you to take your nausea medication as directed.    If you develop nausea and vomiting that is not controlled by your nausea medication, call the clinic.   BELOW ARE SYMPTOMS THAT SHOULD BE REPORTED IMMEDIATELY:  *FEVER GREATER THAN 100.5 F  *CHILLS WITH OR WITHOUT FEVER  NAUSEA AND VOMITING THAT IS NOT CONTROLLED WITH YOUR NAUSEA MEDICATION  *UNUSUAL SHORTNESS OF BREATH  *UNUSUAL BRUISING OR BLEEDING  TENDERNESS IN MOUTH AND THROAT WITH OR WITHOUT PRESENCE OF ULCERS  *URINARY PROBLEMS  *BOWEL PROBLEMS  UNUSUAL RASH Items with * indicate a potential emergency and should be followed up as soon as possible.  Feel free to call the clinic should you have any questions or concerns. The clinic phone number is [phone].  Please show the CHEMO ALERT CARD at check-in to the Emergency Department and triage nurse.  Magnesium Sulfate injection What is this medicine? MAGNESIUM SULFATE (mag NEE zee um SUL fate) is an electrolyte injection commonly used to treat low magnesium levels in your blood. It is also used to prevent or control seizures in women with preeclampsia or eclampsia. This medicine may be used for other purposes; ask your health care provider or pharmacist if you have questions. What should I tell my health care provider before I take this medicine? They need to know if you have any of these conditions:  heart disease  history of irregular heart beat  kidney disease  an unusual or allergic reaction to magnesium sulfate, medicines, foods, dyes, or preservatives  pregnant or trying to get pregnant  breast-feeding How should I use this medicine? This medicine is for infusion into a vein. It is given by a health care professional in a hospital  or clinic setting. Talk to your pediatrician regarding the use of this medicine in children. While this drug may be prescribed for selected conditions, precautions do apply. Overdosage: If you think you have taken too much of this medicine contact a poison control center or emergency room at once. NOTE: This medicine is only for you. Do not share this medicine with others. What if I miss a dose? This does not apply. What may interact with this medicine? This medicine may interact with the following medications:  certain medicines for anxiety or sleep  certain medicines for seizures like phenobarbital  digoxin  medicines that relax muscles for surgery  narcotic medicines for pain This list may not describe all possible interactions. Give your health care provider a list of all the medicines, herbs, non-prescription drugs, or dietary supplements you use. Also tell them if you smoke, drink alcohol, or use illegal drugs. Some items may interact with your medicine. What should I watch for while using this medicine? Your condition will be monitored carefully while you are receiving this medicine. You may need blood work done while you are receiving this medicine. What side effects may I notice from receiving this medicine? Side effects that you should report to your doctor or health care professional as soon as possible:  allergic reactions like skin rash, itching or hives, swelling of the face, lips, or tongue  facial flushing  muscle weakness  signs and symptoms of low blood pressure like dizziness; feeling faint or lightheaded, falls; unusually weak or tired  signs and symptoms of a dangerous change in heartbeat or heart   rhythm like chest pain; dizziness; fast or irregular heartbeat; palpitations; breathing problems  sweating This list may not describe all possible side effects. Call your doctor for medical advice about side effects. You may report side effects to FDA at  1-800-FDA-1088. Where should I keep my medicine? This drug is given in a hospital or clinic and will not be stored at home. NOTE: This sheet is a summary. It may not cover all possible information. If you have questions about this medicine, talk to your doctor, pharmacist, or health care provider.  2020 Elsevier/Gold Standard (2015-07-07 12:31:42)  

## 2018-07-11 NOTE — Progress Notes (Signed)
MD reviewed labs : OK to treat with Mg+ 1.3. Contacted pharmacy to prepare 3 grams IV Mg+ infusion today while here. OK per infusion nurse to add this today.

## 2018-07-16 ENCOUNTER — Other Ambulatory Visit: Payer: Self-pay | Admitting: Oncology

## 2018-07-16 DIAGNOSIS — C189 Malignant neoplasm of colon, unspecified: Secondary | ICD-10-CM

## 2018-07-21 ENCOUNTER — Other Ambulatory Visit: Payer: Self-pay | Admitting: Oncology

## 2018-07-25 ENCOUNTER — Other Ambulatory Visit: Payer: Self-pay

## 2018-07-25 ENCOUNTER — Inpatient Hospital Stay: Payer: 59

## 2018-07-25 VITALS — BP 129/89 | HR 86 | Temp 98.4°F | Resp 18

## 2018-07-25 DIAGNOSIS — Z5112 Encounter for antineoplastic immunotherapy: Secondary | ICD-10-CM | POA: Diagnosis not present

## 2018-07-25 DIAGNOSIS — C184 Malignant neoplasm of transverse colon: Secondary | ICD-10-CM

## 2018-07-25 DIAGNOSIS — C189 Malignant neoplasm of colon, unspecified: Secondary | ICD-10-CM

## 2018-07-25 DIAGNOSIS — Z95828 Presence of other vascular implants and grafts: Secondary | ICD-10-CM

## 2018-07-25 LAB — CMP (CANCER CENTER ONLY)
ALT: 33 U/L (ref 0–44)
AST: 27 U/L (ref 15–41)
Albumin: 3.7 g/dL (ref 3.5–5.0)
Alkaline Phosphatase: 93 U/L (ref 38–126)
Anion gap: 11 (ref 5–15)
BUN: 10 mg/dL (ref 6–20)
CO2: 23 mmol/L (ref 22–32)
Calcium: 9.2 mg/dL (ref 8.9–10.3)
Chloride: 107 mmol/L (ref 98–111)
Creatinine: 1.22 mg/dL — ABNORMAL HIGH (ref 0.44–1.00)
GFR, Est AFR Am: >60 mL/min (ref >60)
GFR, Estimated: 52 mL/min — ABNORMAL LOW (ref >60)
Glucose, Bld: 134 mg/dL — ABNORMAL HIGH (ref 70–99)
Potassium: 3.8 mmol/L (ref 3.5–5.1)
Sodium: 141 mmol/L (ref 135–145)
Total Bilirubin: 0.4 mg/dL (ref 0.3–1.2)
Total Protein: 7.2 g/dL (ref 6.5–8.1)

## 2018-07-25 LAB — MAGNESIUM: Magnesium: 1.4 mg/dL — CL (ref 1.7–2.4)

## 2018-07-25 MED ORDER — SODIUM CHLORIDE 0.9% FLUSH
10.0000 mL | Freq: Once | INTRAVENOUS | Status: AC
  Administered 2018-07-25: 10 mL
  Filled 2018-07-25: qty 10

## 2018-07-25 MED ORDER — SODIUM CHLORIDE 0.9 % IV SOLN
3.0000 g | Freq: Once | INTRAVENOUS | Status: AC
  Administered 2018-07-25: 13:00:00 3 g via INTRAVENOUS
  Filled 2018-07-25: qty 6

## 2018-07-25 MED ORDER — HEPARIN SOD (PORK) LOCK FLUSH 100 UNIT/ML IV SOLN
500.0000 [IU] | Freq: Once | INTRAVENOUS | Status: AC | PRN
  Administered 2018-07-25: 500 [IU]
  Filled 2018-07-25: qty 5

## 2018-07-25 MED ORDER — SODIUM CHLORIDE 0.9 % IV SOLN
Freq: Once | INTRAVENOUS | Status: AC
  Administered 2018-07-25: 12:00:00 via INTRAVENOUS
  Filled 2018-07-25: qty 250

## 2018-07-25 MED ORDER — SODIUM CHLORIDE 0.9 % IV SOLN
6.0000 mg/kg | Freq: Once | INTRAVENOUS | Status: AC
  Administered 2018-07-25: 600 mg via INTRAVENOUS
  Filled 2018-07-25: qty 20

## 2018-07-25 MED ORDER — SODIUM CHLORIDE 0.9% FLUSH
10.0000 mL | INTRAVENOUS | Status: DC | PRN
  Administered 2018-07-25: 15:00:00 10 mL
  Filled 2018-07-25: qty 10

## 2018-07-25 NOTE — Patient Instructions (Signed)

## 2018-07-25 NOTE — Progress Notes (Signed)
Per Dr. [name]: OK to treat with Mg+ 1.4 today. Will administer 3 grams IV Mg+ today.

## 2018-07-25 NOTE — Patient Instructions (Signed)
[facility] Discharge Instructions for Patients Receiving Chemotherapy  Today you received the following chemotherapy agents: Vectibix  To help prevent nausea and vomiting after your treatment, we encourage you to take your nausea medication as directed.    If you develop nausea and vomiting that is not controlled by your nausea medication, call the clinic.   BELOW ARE SYMPTOMS THAT SHOULD BE REPORTED IMMEDIATELY:  *FEVER GREATER THAN 100.5 F  *CHILLS WITH OR WITHOUT FEVER  NAUSEA AND VOMITING THAT IS NOT CONTROLLED WITH YOUR NAUSEA MEDICATION  *UNUSUAL SHORTNESS OF BREATH  *UNUSUAL BRUISING OR BLEEDING  TENDERNESS IN MOUTH AND THROAT WITH OR WITHOUT PRESENCE OF ULCERS  *URINARY PROBLEMS  *BOWEL PROBLEMS  UNUSUAL RASH Items with * indicate a potential emergency and should be followed up as soon as possible.  Feel free to call the clinic should you have any questions or concerns. The clinic phone number is [phone].  Please show the CHEMO ALERT CARD at check-in to the Emergency Department and triage nurse.  Magnesium Sulfate injection What is this medicine? MAGNESIUM SULFATE (mag NEE zee um SUL fate) is an electrolyte injection commonly used to treat low magnesium levels in your blood. It is also used to prevent or control seizures in women with preeclampsia or eclampsia. This medicine may be used for other purposes; ask your health care provider or pharmacist if you have questions. What should I tell my health care provider before I take this medicine? They need to know if you have any of these conditions:  heart disease  history of irregular heart beat  kidney disease  an unusual or allergic reaction to magnesium sulfate, medicines, foods, dyes, or preservatives  pregnant or trying to get pregnant  breast-feeding How should I use this medicine? This medicine is for infusion into a vein. It is given by a health care professional in a hospital  or clinic setting. Talk to your pediatrician regarding the use of this medicine in children. While this drug may be prescribed for selected conditions, precautions do apply. Overdosage: If you think you have taken too much of this medicine contact a poison control center or emergency room at once. NOTE: This medicine is only for you. Do not share this medicine with others. What if I miss a dose? This does not apply. What may interact with this medicine? This medicine may interact with the following medications:  certain medicines for anxiety or sleep  certain medicines for seizures like phenobarbital  digoxin  medicines that relax muscles for surgery  narcotic medicines for pain This list may not describe all possible interactions. Give your health care provider a list of all the medicines, herbs, non-prescription drugs, or dietary supplements you use. Also tell them if you smoke, drink alcohol, or use illegal drugs. Some items may interact with your medicine. What should I watch for while using this medicine? Your condition will be monitored carefully while you are receiving this medicine. You may need blood work done while you are receiving this medicine. What side effects may I notice from receiving this medicine? Side effects that you should report to your doctor or health care professional as soon as possible:  allergic reactions like skin rash, itching or hives, swelling of the face, lips, or tongue  facial flushing  muscle weakness  signs and symptoms of low blood pressure like dizziness; feeling faint or lightheaded, falls; unusually weak or tired  signs and symptoms of a dangerous change in heartbeat or heart   rhythm like chest pain; dizziness; fast or irregular heartbeat; palpitations; breathing problems  sweating This list may not describe all possible side effects. Call your doctor for medical advice about side effects. You may report side effects to FDA at  1-800-FDA-1088. Where should I keep my medicine? This drug is given in a hospital or clinic and will not be stored at home. NOTE: This sheet is a summary. It may not cover all possible information. If you have questions about this medicine, talk to your doctor, pharmacist, or health care provider.  2020 Elsevier/Gold Standard (2015-07-07 12:31:42)  

## 2018-08-04 ENCOUNTER — Other Ambulatory Visit: Payer: Self-pay | Admitting: Oncology

## 2018-08-07 ENCOUNTER — Other Ambulatory Visit: Payer: Self-pay | Admitting: Nurse Practitioner

## 2018-08-07 DIAGNOSIS — C189 Malignant neoplasm of colon, unspecified: Secondary | ICD-10-CM

## 2018-08-08 ENCOUNTER — Other Ambulatory Visit: Payer: Self-pay

## 2018-08-08 ENCOUNTER — Inpatient Hospital Stay (HOSPITAL_BASED_OUTPATIENT_CLINIC_OR_DEPARTMENT_OTHER): Payer: 59 | Admitting: Oncology

## 2018-08-08 ENCOUNTER — Inpatient Hospital Stay: Payer: 59

## 2018-08-08 ENCOUNTER — Inpatient Hospital Stay: Payer: 59 | Attending: Oncology

## 2018-08-08 VITALS — BP 137/89 | HR 89 | Temp 98.7°F | Resp 18 | Ht 64.5 in | Wt 218.5 lb

## 2018-08-08 DIAGNOSIS — C184 Malignant neoplasm of transverse colon: Secondary | ICD-10-CM | POA: Diagnosis present

## 2018-08-08 DIAGNOSIS — Z5112 Encounter for antineoplastic immunotherapy: Secondary | ICD-10-CM | POA: Insufficient documentation

## 2018-08-08 DIAGNOSIS — C189 Malignant neoplasm of colon, unspecified: Secondary | ICD-10-CM

## 2018-08-08 DIAGNOSIS — Z95828 Presence of other vascular implants and grafts: Secondary | ICD-10-CM

## 2018-08-08 LAB — CMP (CANCER CENTER ONLY)
ALT: 33 U/L (ref 0–44)
AST: 26 U/L (ref 15–41)
Albumin: 3.7 g/dL (ref 3.5–5.0)
Alkaline Phosphatase: 89 U/L (ref 38–126)
Anion gap: 13 (ref 5–15)
BUN: 11 mg/dL (ref 6–20)
CO2: 21 mmol/L — ABNORMAL LOW (ref 22–32)
Calcium: 9.3 mg/dL (ref 8.9–10.3)
Chloride: 107 mmol/L (ref 98–111)
Creatinine: 1.16 mg/dL — ABNORMAL HIGH (ref 0.44–1.00)
GFR, Est AFR Am: >60 mL/min (ref >60)
GFR, Estimated: 56 mL/min — ABNORMAL LOW (ref >60)
Glucose, Bld: 140 mg/dL — ABNORMAL HIGH (ref 70–99)
Potassium: 3.7 mmol/L (ref 3.5–5.1)
Sodium: 141 mmol/L (ref 135–145)
Total Bilirubin: 0.3 mg/dL (ref 0.3–1.2)
Total Protein: 7.1 g/dL (ref 6.5–8.1)

## 2018-08-08 LAB — CBC WITH DIFFERENTIAL (CANCER CENTER ONLY)
Abs Immature Granulocytes: 0.01 10*3/uL (ref 0.00–0.07)
Basophils Absolute: 0.1 10*3/uL (ref 0.0–0.1)
Basophils Relative: 1 %
Eosinophils Absolute: 0.3 10*3/uL (ref 0.0–0.5)
Eosinophils Relative: 5 %
HCT: 41 % (ref 36.0–46.0)
Hemoglobin: 13.4 g/dL (ref 12.0–15.0)
Immature Granulocytes: 0 %
Lymphocytes Relative: 35 %
Lymphs Abs: 2.3 10*3/uL (ref 0.7–4.0)
MCH: 28 pg (ref 26.0–34.0)
MCHC: 32.7 g/dL (ref 30.0–36.0)
MCV: 85.6 fL (ref 80.0–100.0)
Monocytes Absolute: 0.5 10*3/uL (ref 0.1–1.0)
Monocytes Relative: 7 %
Neutro Abs: 3.4 10*3/uL (ref 1.7–7.7)
Neutrophils Relative %: 52 %
Platelet Count: 229 10*3/uL (ref 150–400)
RBC: 4.79 MIL/uL (ref 3.87–5.11)
RDW: 13.2 % (ref 11.5–15.5)
WBC Count: 6.6 10*3/uL (ref 4.0–10.5)
nRBC: 0 % (ref 0.0–0.2)

## 2018-08-08 LAB — MAGNESIUM: Magnesium: 1.3 mg/dL — CL (ref 1.7–2.4)

## 2018-08-08 MED ORDER — SODIUM CHLORIDE 0.9 % IV SOLN
3.0000 g | Freq: Once | INTRAVENOUS | Status: AC
  Administered 2018-08-08: 3 g via INTRAVENOUS
  Filled 2018-08-08: qty 6

## 2018-08-08 MED ORDER — SODIUM CHLORIDE 0.9 % IV SOLN
6.0000 mg/kg | Freq: Once | INTRAVENOUS | Status: AC
  Administered 2018-08-08: 600 mg via INTRAVENOUS
  Filled 2018-08-08: qty 20

## 2018-08-08 MED ORDER — SODIUM CHLORIDE 0.9% FLUSH
10.0000 mL | Freq: Once | INTRAVENOUS | Status: AC
  Administered 2018-08-08: 10 mL
  Filled 2018-08-08: qty 10

## 2018-08-08 MED ORDER — SODIUM CHLORIDE 0.9% FLUSH
10.0000 mL | INTRAVENOUS | Status: DC | PRN
  Administered 2018-08-08: 15:00:00 10 mL
  Filled 2018-08-08: qty 10

## 2018-08-08 MED ORDER — SODIUM CHLORIDE 0.9 % IV SOLN
Freq: Once | INTRAVENOUS | Status: AC
  Administered 2018-08-08: 12:00:00 via INTRAVENOUS
  Filled 2018-08-08: qty 250

## 2018-08-08 MED ORDER — HEPARIN SOD (PORK) LOCK FLUSH 100 UNIT/ML IV SOLN
500.0000 [IU] | Freq: Once | INTRAVENOUS | Status: AC | PRN
  Administered 2018-08-08: 500 [IU]
  Filled 2018-08-08: qty 5

## 2018-08-08 NOTE — Progress Notes (Signed)
Give magnesium sulfate 3 grams IV per Dr. Miles Costain today for Mg level of 1.3.  Demetrius Charity, PharmD, [place] [facility] Pharmacist Pharmacy Phone: [phone] 08/08/2018

## 2018-08-08 NOTE — Progress Notes (Signed)
Per Dr. [name]: OK to treat today with Mg+ 1.3. Pharmacy to order 3 grams IV today. Continued QID oral Mg+ at home

## 2018-08-08 NOTE — Progress Notes (Deleted)
[place] OFFICE PROGRESS NOTE   Diagnosis:   INTERVAL HISTORY:   ***  Objective:  Vital signs in last 24 hours:  Blood pressure 137/89, pulse 89, temperature 98.7 F (37.1 C), temperature source Oral, resp. rate 18, height 5' 4.5" (1.638 m), weight 218 lb 8 oz (99.1 kg), SpO2 100 %.    HEENT: *** Lymphatics: *** Resp: *** Cardio: *** GI: *** Vascular: *** Neuro:***  Skin:***   Portacath/PICC-without erythema  Lab Results:  Lab Results  Component Value Date   WBC 6.6 08/08/2018   HGB 13.4 08/08/2018   HCT 41.0 08/08/2018   MCV 85.6 08/08/2018   PLT 229 08/08/2018   NEUTROABS 3.4 08/08/2018    CMP  Lab Results  Component Value Date   NA 141 08/08/2018   K 3.7 08/08/2018   CL 107 08/08/2018   CO2 21 (L) 08/08/2018   GLUCOSE 140 (H) 08/08/2018   BUN 11 08/08/2018   CREATININE 1.16 (H) 08/08/2018   CALCIUM 9.3 08/08/2018   PROT 7.1 08/08/2018   ALBUMIN 3.7 08/08/2018   AST 26 08/08/2018   ALT 33 08/08/2018   ALKPHOS 89 08/08/2018   BILITOT 0.3 08/08/2018   GFRNONAA 56 (L) 08/08/2018   GFRAA >60 08/08/2018    Lab Results  Component Value Date   CEA1 <1.00 11/22/2017    Lab Results  Component Value Date   INR 0.94 12/22/2014    Imaging:  No results found.  Medications: I have reviewed the patient's current medications.   Assessment/Plan: 1.  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  Cycle 2 CAPOX 01/22/2015  Cycle 3 CAPOX 02/12/2015  Cycle 4 CAPOX 03/05/2015  Cycle 5 CAPOX 03/26/2015  Cycle 6 CAPOX 04/16/2015  Cycle 7 CAPOX 05/07/2015 (oxaliplatin held due to progressive neuropathy)  Cycle 8 CAPOX 05/28/2015  (oxaliplatin held secondary to neuropathy)  CTs 11/01/2015-no significant change and an 11 mm upper abdomen mesenteric lymph node with partial calcification-treated metastasis?, No other evidence of metastatic disease  CTs 01/21/2016-mild interval progression of previously identified calcified nodal tissue in the upper central abdomen. Development of small adjacent lymph nodes in the interval.  EUS guided biopsy of the calcified upper abdominal mass on 02/17/2016 confirmed metastatic adenocarcinoma  Cycle 1 FOLFIRI/Avastin 03/09/2016  Cycle 2 FOLFIRI/Avastin 03/23/2016  Cycle 3 FOLFIRI/Avastin 04/06/2016 (Neulasta added)  Cycle 4 FOLFIRI/Avastin 04/20/2016  Cycle 5 FOLFIRI/Avastin 05/04/2016  Cycle 6 FOLFIRI/Avastin 05/18/2016  CT abdomen/pelvis 05/30/2016-new retroperitoneal lymphadenopathy in the para-aortic region with the largest node measuring 1.5 cm. Partially calcified mesenteric lymph node measures 12 mm as compared to 14 mm previously. Other scattered less than 1 cmmesenteric lymph nodes remain stable.  PET scan 06/13/2016-hypermetabolic posterior mediastinal node, hypermetabolic retroperitoneal lymph nodes, stable transverse mesocolon mass, bilateral hypermetabolic inguinal nodes  EUS biopsy of posterior mediastinal lymph nodes on 07/20/2016-positive for metastatic adenocarcinoma  Clinical trial at [facility], [id number] Panitumumab, nivolumab, ipilimumab.  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 No change in small calcified mesenteric and retroperitoneal abdominal lymph nodes. No evidence  of metastatic disease in the chest.  Maintenance panitumumab  beginning 03/29/2017, healed 05/24/2017 secondary to elevated liver enzymes-resumed 06/07/2017  Restaging CTs at [facility] 06/04/2017-no evidence of disease progression, unchanged scattered mesenteric/retroperitoneal lymph nodes  Panitumumab every 2 weeks  Restaging CTs 08/27/2017-no evidence of metastatic disease in the chest;unchanged size and appearance of scattered calcified and noncalcified mesenteric and retroperitoneal lymph nodes. Near complete resolution of previously described haziness within the central mesentery. No new evidence of recurrent or metastatic disease within the abdomen or pelvis.  Continue Panitumumab every 2 weeks  Restaging CTs 01/01/2018-stable lymph nodes within the posterior mediastinum, retroperitoneum and transverse mesocolon  Continue Panitumumab every 2 weeks  Restaging CT 07/08/2018- no evidence of metastatic disease, we noted small retroperitoneal/mesenteric nodes are calcified  Every 2-week panitumumab continued 2. History of Microcytic anemia-likely iron deficiency anemia secondary to #1 and ongoing menses 3. Status post cholecystectomy 4. Family history of breast and ovarian cancer. She has seen the genetics counselor. 5. Delayed nausea following cycle 1 CAPOX. Aloxi and Emend added beginning with cycle 2. 6. Transient episodes of dyspnea following cycle 4 and cycle 5 CAPOX, likely neurotoxicity from oxaliplatin 7. Oxaliplatin neuropathy-improved 8. Mild neutropenia secondary to chemotherapy-Neulasta added beginning with cycle 3 FOLFIRI/Avastin 9. Mild mucositis following cycle 4 FOLFIRI/Avastin 10. Mammogram 05/11/2016-possible mass right breast. Right breast ultrasound 05/17/2016-right breast mass at 10:00 most likely represents a cyst, however is somewhat hypoechoic and could also represent a fibroadenoma. Ultrasound-guided aspiration of a 1 cmbenign cyst 05/22/2016. 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 12. Hypomagnesia  secondary to Panitumumab. She continues magnesium oxide. 13. Elevated liver enzymes May 2019-likely secondary to resuming atorvastatin, now normal 14. History of paronychia related to Panitumumab.     Disposition: ***  Gary Sherrill, MD  08/08/2018  10:51 AM   

## 2018-08-08 NOTE — Patient Instructions (Signed)
[place] Discharge Instructions for Patients Receiving Chemotherapy  Today you received the following chemotherapy agents: Vectibix  To help prevent nausea and vomiting after your treatment, we encourage you to take your nausea medication as directed.    If you develop nausea and vomiting that is not controlled by your nausea medication, call the clinic.   BELOW ARE SYMPTOMS THAT SHOULD BE REPORTED IMMEDIATELY:  *FEVER GREATER THAN 100.5 F  *CHILLS WITH OR WITHOUT FEVER  NAUSEA AND VOMITING THAT IS NOT CONTROLLED WITH YOUR NAUSEA MEDICATION  *UNUSUAL SHORTNESS OF BREATH  *UNUSUAL BRUISING OR BLEEDING  TENDERNESS IN MOUTH AND THROAT WITH OR WITHOUT PRESENCE OF ULCERS  *URINARY PROBLEMS  *BOWEL PROBLEMS  UNUSUAL RASH Items with * indicate a potential emergency and should be followed up as soon as possible.  Feel free to call the clinic should you have any questions or concerns. The clinic phone number is [phone].  Please show the [facility] at check-in to the Emergency Department and triage nurse.  Magnesium Sulfate injection What is this medicine? MAGNESIUM SULFATE (mag NEE zee um SUL fate) is an electrolyte injection commonly used to treat low magnesium levels in your blood. It is also used to prevent or control seizures in women with preeclampsia or eclampsia. This medicine may be used for other purposes; ask your health care provider or pharmacist if you have questions. What should I tell my health care provider before I take this medicine? They need to know if you have any of these conditions: heart disease history of irregular heart beat kidney disease an unusual or allergic reaction to magnesium sulfate, medicines, foods, dyes, or preservatives pregnant or trying to get pregnant breast-feeding How should I use this medicine? This medicine is for infusion into a vein. It is given by a health care professional in a hospital or clinic  setting. Talk to your pediatrician regarding the use of this medicine in children. While this drug may be prescribed for selected conditions, precautions do apply. Overdosage: If you think you have taken too much of this medicine contact a poison control center or emergency room at once. NOTE: This medicine is only for you. Do not share this medicine with others. What if I miss a dose? This does not apply. What may interact with this medicine? This medicine may interact with the following medications: certain medicines for anxiety or sleep certain medicines for seizures like phenobarbital digoxin medicines that relax muscles for surgery narcotic medicines for pain This list may not describe all possible interactions. Give your health care provider a list of all the medicines, herbs, non-prescription drugs, or dietary supplements you use. Also tell them if you smoke, drink alcohol, or use illegal drugs. Some items may interact with your medicine. What should I watch for while using this medicine? Your condition will be monitored carefully while you are receiving this medicine. You may need blood work done while you are receiving this medicine. What side effects may I notice from receiving this medicine? Side effects that you should report to your doctor or health care professional as soon as possible: allergic reactions like skin rash, itching or hives, swelling of the face, lips, or tongue facial flushing muscle weakness signs and symptoms of low blood pressure like dizziness; feeling faint or lightheaded, falls; unusually weak or tired signs and symptoms of a dangerous change in heartbeat or heart rhythm like chest pain; dizziness; fast or irregular heartbeat; palpitations; breathing problems sweating This list may  not describe all possible side effects. Call your doctor for medical advice about side effects. You may report side effects to FDA at 1-800-FDA-1088. Where should I keep my  medicine? This drug is given in a hospital or clinic and will not be stored at home. NOTE: This sheet is a summary. It may not cover all possible information. If you have questions about this medicine, talk to your doctor, pharmacist, or health care provider.  2020 Elsevier/Gold Standard (2015-07-07 12:31:42)   Hypomagnesemia Hypomagnesemia is a condition in which the level of magnesium in the blood is low. Magnesium is a mineral that is found in many foods. It is used in many different processes in the body. Hypomagnesemia can affect every organ in the body. In severe cases, it can cause life-threatening problems. What are the causes? This condition may be caused by:  Not getting enough magnesium in your diet.  Malnutrition.  Problems with absorbing magnesium from the intestines.  Dehydration.  Alcohol abuse.  Vomiting.  Severe or chronic diarrhea.  Some medicines, including medicines that make you urinate more (diuretics).  Certain diseases, such as kidney disease, diabetes, celiac disease, and overactive thyroid. What are the signs or symptoms? Symptoms of this condition include:  Loss of appetite.  Nausea and vomiting.  Involuntary shaking or trembling of a body part (tremor).  Muscle weakness.  Tingling in the arms and legs.  Sudden tightening of muscles (muscle spasms).  Confusion.  Psychiatric issues, such as depression, irritability, or psychosis.  A feeling of fluttering of the heart.  Seizures. These symptoms are more severe if magnesium levels drop suddenly. How is this diagnosed? This condition may be diagnosed based on:  Your symptoms and medical history.  A physical exam.  Blood and urine tests. How is this treated? Treatment depends on the cause and the severity of the condition. It may be treated with:  A magnesium supplement. This can be taken in pill form. If the condition is severe, magnesium is usually given through an IV.  Changes  to your diet. You may be directed to eat foods that have a lot of magnesium, such as green leafy vegetables, peas, beans, and nuts.  Stopping any intake of alcohol. Follow these instructions at home:      Make sure that your diet includes foods with magnesium. Foods that have a lot of magnesium in them include: ? Green leafy vegetables, such as spinach and broccoli. ? Beans and peas. ? Nuts and seeds, such as almonds and sunflower seeds. ? Whole grains, such as whole grain bread and fortified cereals.  Take magnesium supplements if your health care provider tells you to do that. Take them as directed.  Take over-the-counter and prescription medicines only as told by your health care provider.  Have your magnesium levels monitored as told by your health care provider.  When you are active, drink fluids that contain electrolytes.  Avoid drinking alcohol.  Keep all follow-up visits as told by your health care provider. This is important. Contact a health care provider if:  You get worse instead of better.  Your symptoms return. Get help right away if you:  Develop severe muscle weakness.  Have trouble breathing.  Feel that your heart is racing. Summary  Hypomagnesemia is a condition in which the level of magnesium in the blood is low.  Hypomagnesemia can affect every organ in the body.  Treatment may include eating more foods that contain magnesium, taking magnesium supplements, and not drinking alcohol.  Have your magnesium levels monitored as told by your health care provider. This information is not intended to replace advice given to you by your health care provider. Make sure you discuss any questions you have with your health care provider. Document Released: 09/14/2004 Document Revised: 12/01/2016 Document Reviewed: 11/20/2016 Elsevier Patient Education  2020 [employer].

## 2018-08-08 NOTE — Progress Notes (Signed)
[place] OFFICE PROGRESS NOTE   Diagnosis: Colon cancer  INTERVAL HISTORY:   Brenda Becker completed another treatment with Panitumumab on 07/25/2018.  Stable skin rash.  No diarrhea.  She had an episode of "sciatica" and is seeing a Restaurant manager, fast food.  This pain has improved.  She reports intentional weight loss.  Objective:  Vital signs in last 24 hours:  Blood pressure 137/89, pulse 89, temperature 98.7 F (37.1 C), temperature source Oral, resp. rate 18, height 5' 4.5" (1.638 m), weight 218 lb 8 oz (99.1 kg), SpO2 100 %.   Limited physical examination secondary to distancing with the COVID pandemic  Skin: Acne type rash over the face  Portacath/PICC-without erythema  Lab Results:  Lab Results  Component Value Date   WBC 6.6 08/08/2018   HGB 13.4 08/08/2018   HCT 41.0 08/08/2018   MCV 85.6 08/08/2018   PLT 229 08/08/2018   NEUTROABS 3.4 08/08/2018    CMP  Lab Results  Component Value Date   NA 141 08/08/2018   K 3.7 08/08/2018   CL 107 08/08/2018   CO2 21 (L) 08/08/2018   GLUCOSE 140 (H) 08/08/2018   BUN 11 08/08/2018   CREATININE 1.16 (H) 08/08/2018   CALCIUM 9.3 08/08/2018   PROT 7.1 08/08/2018   ALBUMIN 3.7 08/08/2018   AST 26 08/08/2018   ALT 33 08/08/2018   ALKPHOS 89 08/08/2018   BILITOT 0.3 08/08/2018   GFRNONAA 56 (L) 08/08/2018   GFRAA >60 08/08/2018    Lab Results  Component Value Date   CEA1 <1.00 11/22/2017     Medications: I have reviewed the patient's current medications.   Assessment/Plan: 1.  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  Cycle 2 CAPOX 01/22/2015  Cycle 3 CAPOX 02/12/2015  Cycle 4 CAPOX 03/05/2015  Cycle 5  CAPOX 03/26/2015  Cycle 6 CAPOX 04/16/2015  Cycle 7 CAPOX 05/07/2015 (oxaliplatin held due to progressive neuropathy)  Cycle 8 CAPOX 05/28/2015 (oxaliplatin held secondary to neuropathy)  CTs 11/01/2015-no significant change and an 11 mm upper abdomen mesenteric lymph node with partial calcification-treated metastasis?, No other evidence of metastatic disease  CTs 01/21/2016-mild interval progression of previously identified calcified nodal tissue in the upper central abdomen. Development of small adjacent lymph nodes in the interval.  EUS guided biopsy of the calcified upper abdominal mass on 02/17/2016 confirmed metastatic adenocarcinoma  Cycle 1 FOLFIRI/Avastin 03/09/2016  Cycle 2 FOLFIRI/Avastin 03/23/2016  Cycle 3 FOLFIRI/Avastin 04/06/2016 (Neulasta added)  Cycle 4 FOLFIRI/Avastin 04/20/2016  Cycle 5 FOLFIRI/Avastin 05/04/2016  Cycle 6 FOLFIRI/Avastin 05/18/2016  CT abdomen/pelvis 05/30/2016-new retroperitoneal lymphadenopathy in the para-aortic region with the largest node measuring 1.5 cm. Partially calcified mesenteric lymph node measures 12 mm as compared to 14 mm previously. Other scattered less than 1 cmmesenteric lymph nodes remain stable.  PET scan 06/13/2016-hypermetabolic posterior mediastinal node, hypermetabolic retroperitoneal lymph nodes, stable transverse mesocolon mass, bilateral hypermetabolic inguinal nodes  EUS biopsy of posterior mediastinal lymph nodes on 07/20/2016-positive for metastatic adenocarcinoma  Clinical trial at [facility], [id number] Panitumumab, nivolumab, ipilimumab.  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 No change  in small calcified mesenteric and retroperitoneal abdominal lymph nodes. No evidence of metastatic disease in the chest.  Maintenance panitumumab beginning 03/29/2017, healed 05/24/2017 secondary to elevated liver enzymes-resumed 06/07/2017  [facility] at [facility] 06/04/2017-no evidence of disease progression, unchanged scattered mesenteric/retroperitoneal lymph nodes  Panitumumab every 2 weeks  Restaging CTs 08/27/2017-no evidence of metastatic disease in the chest;unchanged size and appearance of scattered calcified and noncalcified mesenteric and retroperitoneal lymph nodes. Near complete resolution of previously described haziness within the central mesentery. No new evidence of recurrent or metastatic disease within the abdomen or pelvis.  Continue Panitumumab every 2 weeks  Restaging CTs 01/01/2018-stable lymph nodes within the posterior mediastinum, retroperitoneum and transverse mesocolon  Continue Panitumumab every 2 weeks  Restaging CT 07/08/2018- no evidence of metastatic disease, we noted small retroperitoneal/mesenteric nodes are calcified  Every 2-week panitumumab continued   Panitumumab changed to monthly dosing beginning 08/08/2018 2. History of Microcytic anemia-likely iron deficiency anemia secondary to #1 and ongoing menses 3. Status post cholecystectomy 4. Family history of breast and ovarian cancer. She has seen the genetics counselor. 5. Delayed nausea following cycle 1 CAPOX. Aloxi and Emend added beginning with cycle 2. 6. Transient episodes of dyspnea following cycle 4 and cycle 5 CAPOX, likely neurotoxicity from oxaliplatin 7. Oxaliplatin neuropathy-improved 8. Mild neutropenia secondary to chemotherapy-Neulasta added beginning with cycle 3 FOLFIRI/Avastin 9. Mild mucositis following cycle 4 FOLFIRI/Avastin 10. Mammogram 05/11/2016-possible mass right breast. Right breast ultrasound 05/17/2016-right breast mass at 10:00 most likely represents a cyst, however is  somewhat hypoechoic and could also represent a fibroadenoma. Ultrasound-guided aspiration of a 1 cmbenign cyst 05/22/2016. 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 12. Hypomagnesia secondary to Panitumumab. She continues magnesium oxide. 13. Elevated liver enzymes May 2019-likely secondary to resuming atorvastatin, now normal 14. History of paronychia related to Panitumumab.    Disposition: Ms. Dick appears unchanged.  There is no clinical or radiologic evidence of persistent colon cancer.  We discussed treatment options.  We discussed discontinuing therapy and increasing treatment interval.  She most comfortable with continued at a 4-week schedule.  She wanted a restaging CTs in 6 months and we will consider discontinuing treatment if she remains without evidence of disease.  I reviewed options with Dr. Aleatha Borer last month and she agrees with discontinuing treatment.  It is unclear whether the clinical remission is related to Panitumumab or the course of immunotherapy at [facility].  Brenda Coder, MD  08/08/2018  10:56 AM

## 2018-08-22 ENCOUNTER — Telehealth: Payer: Self-pay | Admitting: Oncology

## 2018-08-22 NOTE — Telephone Encounter (Signed)
Called and left msg about 9/3 appt. Mailed printout

## 2018-08-28 ENCOUNTER — Ambulatory Visit: Payer: 59 | Admitting: Obstetrics & Gynecology

## 2018-08-31 ENCOUNTER — Other Ambulatory Visit: Payer: Self-pay | Admitting: Oncology

## 2018-09-05 ENCOUNTER — Inpatient Hospital Stay: Payer: 59

## 2018-09-05 ENCOUNTER — Other Ambulatory Visit: Payer: Self-pay

## 2018-09-05 ENCOUNTER — Inpatient Hospital Stay: Payer: 59 | Attending: Oncology | Admitting: Nurse Practitioner

## 2018-09-05 ENCOUNTER — Encounter: Payer: Self-pay | Admitting: Nurse Practitioner

## 2018-09-05 VITALS — BP 119/82 | HR 89 | Temp 99.2°F | Resp 18 | Ht 64.5 in | Wt 213.7 lb

## 2018-09-05 DIAGNOSIS — Z95828 Presence of other vascular implants and grafts: Secondary | ICD-10-CM

## 2018-09-05 DIAGNOSIS — C189 Malignant neoplasm of colon, unspecified: Secondary | ICD-10-CM

## 2018-09-05 DIAGNOSIS — C184 Malignant neoplasm of transverse colon: Secondary | ICD-10-CM

## 2018-09-05 DIAGNOSIS — Z5112 Encounter for antineoplastic immunotherapy: Secondary | ICD-10-CM | POA: Insufficient documentation

## 2018-09-05 LAB — CMP (CANCER CENTER ONLY)
ALT: 26 U/L (ref 0–44)
AST: 23 U/L (ref 15–41)
Albumin: 3.7 g/dL (ref 3.5–5.0)
Alkaline Phosphatase: 101 U/L (ref 38–126)
Anion gap: 10 (ref 5–15)
BUN: 12 mg/dL (ref 6–20)
CO2: 23 mmol/L (ref 22–32)
Calcium: 9 mg/dL (ref 8.9–10.3)
Chloride: 108 mmol/L (ref 98–111)
Creatinine: 1.18 mg/dL — ABNORMAL HIGH (ref 0.44–1.00)
GFR, Est AFR Am: >60 mL/min (ref >60)
GFR, Estimated: 54 mL/min — ABNORMAL LOW (ref >60)
Glucose, Bld: 81 mg/dL (ref 70–99)
Potassium: 4.3 mmol/L (ref 3.5–5.1)
Sodium: 141 mmol/L (ref 135–145)
Total Bilirubin: 0.3 mg/dL (ref 0.3–1.2)
Total Protein: 7.1 g/dL (ref 6.5–8.1)

## 2018-09-05 LAB — MAGNESIUM: Magnesium: 1.6 mg/dL — ABNORMAL LOW (ref 1.7–2.4)

## 2018-09-05 MED ORDER — HEPARIN SOD (PORK) LOCK FLUSH 100 UNIT/ML IV SOLN
500.0000 [IU] | Freq: Once | INTRAVENOUS | Status: AC | PRN
  Administered 2018-09-05: 500 [IU]
  Filled 2018-09-05: qty 5

## 2018-09-05 MED ORDER — SODIUM CHLORIDE 0.9 % IV SOLN
Freq: Once | INTRAVENOUS | Status: AC
  Administered 2018-09-05: 13:00:00 via INTRAVENOUS
  Filled 2018-09-05: qty 250

## 2018-09-05 MED ORDER — SODIUM CHLORIDE 0.9% FLUSH
10.0000 mL | INTRAVENOUS | Status: DC | PRN
  Administered 2018-09-05: 10 mL
  Filled 2018-09-05: qty 10

## 2018-09-05 MED ORDER — SODIUM CHLORIDE 0.9% FLUSH
10.0000 mL | Freq: Once | INTRAVENOUS | Status: AC
  Administered 2018-09-05: 11:00:00 10 mL
  Filled 2018-09-05: qty 10

## 2018-09-05 MED ORDER — SODIUM CHLORIDE 0.9 % IV SOLN
6.0000 mg/kg | Freq: Once | INTRAVENOUS | Status: AC
  Administered 2018-09-05: 14:00:00 600 mg via INTRAVENOUS
  Filled 2018-09-05: qty 20

## 2018-09-05 NOTE — Progress Notes (Signed)
[facility] OFFICE PROGRESS NOTE   Diagnosis: Colon cancer  INTERVAL HISTORY:   B[facility] Becker returns as scheduled.  She continues Panitumumab on a 4-week schedule.  She overall feels well. No nausea or vomiting.  She has intermittent diarrhea.  Skin rash varies.  She denies pain.  She has a good appetite and good energy level.  Objective:  Vital signs in last 24 hours:  Blood pressure 119/82, pulse 89, temperature 99.2 F (37.3 C), temperature source Oral, resp. rate 18, height 5' 4.5" (1.638 m), weight 213 lb 11.2 oz (96.9 kg), SpO2 100 %.    HEENT: No thrush or ulcers. GI: Abdomen soft and nontender.  No hepatomegaly. Vascular: No leg edema. Neuro: Alert and oriented. Skin: A few acne type skin lesions at the upper chest. Port-A-Cath without erythema.   Lab Results:  Lab Results  Component Value Date   WBC 6.6 08/08/2018   HGB 13.4 08/08/2018   HCT 41.0 08/08/2018   MCV 85.6 08/08/2018   PLT 229 08/08/2018   NEUTROABS 3.4 08/08/2018    Imaging:  No results found.  Medications: I have reviewed the patient's current medications.  Assessment/Plan: 1.  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  Cycle 2 CAPOX 01/22/2015  Cycle 3 CAPOX 02/12/2015  Cycle 4 CAPOX 03/05/2015  Cycle 5 CAPOX 03/26/2015  Cycle 6 CAPOX 04/16/2015  Cycle 7 CAPOX 05/07/2015 (oxaliplatin held due to progressive neuropathy)  Cycle 8 CAPOX 05/28/2015 (oxaliplatin held secondary to neuropathy)  CTs 11/01/2015-no significant change and an 11 mm upper abdomen mesenteric lymph node with partial calcification-treated metastasis?, No other evidence of metastatic disease  CTs  01/21/2016-mild interval progression of previously identified calcified nodal tissue in the upper central abdomen. Development of small adjacent lymph nodes in the interval.  EUS guided biopsy of the calcified upper abdominal mass on 02/17/2016 confirmed metastatic adenocarcinoma  Cycle 1 FOLFIRI/Avastin 03/09/2016  Cycle 2 FOLFIRI/Avastin 03/23/2016  Cycle 3 FOLFIRI/Avastin 04/06/2016 (Neulasta added)  Cycle 4 FOLFIRI/Avastin 04/20/2016  Cycle 5 FOLFIRI/Avastin 05/04/2016  Cycle 6 FOLFIRI/Avastin 05/18/2016  CT abdomen/pelvis 05/30/2016-new retroperitoneal lymphadenopathy in the para-aortic region with the largest node measuring 1.5 cm. Partially calcified mesenteric lymph node measures 12 mm as compared to 14 mm previously. Other scattered less than 1 cmmesenteric lymph nodes remain stable.  PET scan 06/13/2016-hypermetabolic posterior mediastinal node, hypermetabolic retroperitoneal lymph nodes, stable transverse mesocolon mass, bilateral hypermetabolic inguinal nodes  EUS biopsy of posterior mediastinal lymph nodes on 07/20/2016-positive for metastatic adenocarcinoma  Clinical trial at [facility], [id number] Panitumumab, nivolumab, ipilimumab.  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 No change in small calcified mesenteric and retroperitoneal abdominal lymph nodes. No evidence of metastatic disease in the chest.  Maintenance panitumumab beginning 03/29/2017, healed 05/24/2017 secondary to elevated liver enzymes-resumed 06/07/2017  [facility]aging CTs at [facility] 06/04/2017-no evidence of disease progression, unchanged scattered mesenteric/retroperitoneal lymph nodes   Panitumumab every 2 weeks  Restaging CTs 08/27/2017-no evidence of metastatic disease in the chest;unchanged size and appearance of scattered calcified and noncalcified mesenteric and retroperitoneal  lymph nodes. Near complete resolution of previously described haziness within the central mesentery. No new evidence of recurrent or metastatic disease within the abdomen or pelvis.  Continue Panitumumab every 2 weeks  Restaging CTs 01/01/2018-stable lymph nodes within the posterior mediastinum, retroperitoneum and transverse mesocolon  Continue Panitumumab every 2 weeks  Restaging CT 07/08/2018- no evidence of metastatic disease, we noted small retroperitoneal/mesenteric nodes are calcified  Every 2-week panitumumab continued   Panitumumab changed to monthly dosing beginning 08/08/2018 2. History of Microcytic anemia-likely iron deficiency anemia secondary to #1 and ongoing menses 3. Status post cholecystectomy 4. Family history of breast and ovarian cancer. She has seen the genetics counselor. 5. Delayed nausea following cycle 1 CAPOX. Aloxi and Emend added beginning with cycle 2. 6. Transient episodes of dyspnea following cycle 4 and cycle 5 CAPOX, likely neurotoxicity from oxaliplatin 7. Oxaliplatin neuropathy-improved 8. Mild neutropenia secondary to chemotherapy-Neulasta added beginning with cycle 3 FOLFIRI/Avastin 9. Mild mucositis following cycle 4 FOLFIRI/Avastin 10. Mammogram 05/11/2016-possible mass right breast. Right breast ultrasound 05/17/2016-right breast mass at 10:00 most likely represents a cyst, however is somewhat hypoechoic and could also represent a fibroadenoma. Ultrasound-guided aspiration of a 1 cmbenign cyst 05/22/2016. 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 12. Hypomagnesia secondary to Panitumumab. She continues magnesium oxide. 13. Elevated liver enzymes May 2019-likely secondary to resuming atorvastatin, now normal 14. History of  paronychia related to Panitumumab.    Disposition: Brenda Becker appears stable.  There is no clinical evidence of disease progression.  Plan to continue Panitumumab every 4 weeks.  The plan is for restaging CTs at a six-month interval.  We reviewed the labs from today, ok for treatment.   She will return for lab, follow-up, Panitumumab in 4 weeks.  She will contact the office in the interim with any problems.   [name]   09/05/2018  12:13 PM

## 2018-09-06 ENCOUNTER — Telehealth: Payer: Self-pay | Admitting: Oncology

## 2018-09-06 NOTE — Telephone Encounter (Signed)
Called and spoke with patient. Confirmed date and time of appt  ° °

## 2018-09-18 ENCOUNTER — Other Ambulatory Visit: Payer: Self-pay | Admitting: *Deleted

## 2018-09-18 ENCOUNTER — Other Ambulatory Visit: Payer: Self-pay | Admitting: Oncology

## 2018-09-18 DIAGNOSIS — C189 Malignant neoplasm of colon, unspecified: Secondary | ICD-10-CM

## 2018-09-29 ENCOUNTER — Other Ambulatory Visit: Payer: Self-pay | Admitting: Oncology

## 2018-10-03 ENCOUNTER — Inpatient Hospital Stay: Payer: 59

## 2018-10-03 ENCOUNTER — Inpatient Hospital Stay: Payer: 59 | Attending: Oncology | Admitting: Oncology

## 2018-10-03 ENCOUNTER — Other Ambulatory Visit: Payer: Self-pay

## 2018-10-03 VITALS — BP 128/88 | HR 85 | Temp 98.7°F | Resp 18 | Ht 64.5 in | Wt 213.4 lb

## 2018-10-03 DIAGNOSIS — C189 Malignant neoplasm of colon, unspecified: Secondary | ICD-10-CM

## 2018-10-03 DIAGNOSIS — Z95828 Presence of other vascular implants and grafts: Secondary | ICD-10-CM

## 2018-10-03 DIAGNOSIS — C184 Malignant neoplasm of transverse colon: Secondary | ICD-10-CM

## 2018-10-03 DIAGNOSIS — Z5112 Encounter for antineoplastic immunotherapy: Secondary | ICD-10-CM | POA: Diagnosis present

## 2018-10-03 LAB — CBC WITH DIFFERENTIAL (CANCER CENTER ONLY)
Abs Immature Granulocytes: 0.02 10*3/uL (ref 0.00–0.07)
Basophils Absolute: 0.1 10*3/uL (ref 0.0–0.1)
Basophils Relative: 1 %
Eosinophils Absolute: 0.1 10*3/uL (ref 0.0–0.5)
Eosinophils Relative: 2 %
HCT: 38.7 % (ref 36.0–46.0)
Hemoglobin: 13 g/dL (ref 12.0–15.0)
Immature Granulocytes: 0 %
Lymphocytes Relative: 40 %
Lymphs Abs: 2.4 10*3/uL (ref 0.7–4.0)
MCH: 28.8 pg (ref 26.0–34.0)
MCHC: 33.6 g/dL (ref 30.0–36.0)
MCV: 85.6 fL (ref 80.0–100.0)
Monocytes Absolute: 0.5 10*3/uL (ref 0.1–1.0)
Monocytes Relative: 8 %
Neutro Abs: 2.9 10*3/uL (ref 1.7–7.7)
Neutrophils Relative %: 49 %
Platelet Count: 251 10*3/uL (ref 150–400)
RBC: 4.52 MIL/uL (ref 3.87–5.11)
RDW: 13.3 % (ref 11.5–15.5)
WBC Count: 6 10*3/uL (ref 4.0–10.5)
nRBC: 0 % (ref 0.0–0.2)

## 2018-10-03 LAB — CMP (CANCER CENTER ONLY)
ALT: 18 U/L (ref 0–44)
AST: 20 U/L (ref 15–41)
Albumin: 3.5 g/dL (ref 3.5–5.0)
Alkaline Phosphatase: 102 U/L (ref 38–126)
Anion gap: 10 (ref 5–15)
BUN: 12 mg/dL (ref 6–20)
CO2: 21 mmol/L — ABNORMAL LOW (ref 22–32)
Calcium: 9.2 mg/dL (ref 8.9–10.3)
Chloride: 108 mmol/L (ref 98–111)
Creatinine: 1.21 mg/dL — ABNORMAL HIGH (ref 0.44–1.00)
GFR, Est AFR Am: >60 mL/min (ref >60)
GFR, Estimated: 53 mL/min — ABNORMAL LOW (ref >60)
Glucose, Bld: 81 mg/dL (ref 70–99)
Potassium: 4.2 mmol/L (ref 3.5–5.1)
Sodium: 139 mmol/L (ref 135–145)
Total Bilirubin: 0.3 mg/dL (ref 0.3–1.2)
Total Protein: 7.1 g/dL (ref 6.5–8.1)

## 2018-10-03 LAB — MAGNESIUM: Magnesium: 1.8 mg/dL (ref 1.7–2.4)

## 2018-10-03 MED ORDER — SODIUM CHLORIDE 0.9% FLUSH
10.0000 mL | INTRAVENOUS | Status: DC | PRN
  Administered 2018-10-03: 10 mL
  Filled 2018-10-03: qty 10

## 2018-10-03 MED ORDER — HEPARIN SOD (PORK) LOCK FLUSH 100 UNIT/ML IV SOLN
500.0000 [IU] | Freq: Once | INTRAVENOUS | Status: AC | PRN
  Administered 2018-10-03: 500 [IU]
  Filled 2018-10-03: qty 5

## 2018-10-03 MED ORDER — SODIUM CHLORIDE 0.9% FLUSH
10.0000 mL | Freq: Once | INTRAVENOUS | Status: AC
  Administered 2018-10-03: 10 mL
  Filled 2018-10-03: qty 10

## 2018-10-03 MED ORDER — SODIUM CHLORIDE 0.9 % IV SOLN
Freq: Once | INTRAVENOUS | Status: AC
  Administered 2018-10-03: 13:00:00 via INTRAVENOUS
  Filled 2018-10-03: qty 250

## 2018-10-03 MED ORDER — SODIUM CHLORIDE 0.9 % IV SOLN
6.0000 mg/kg | Freq: Once | INTRAVENOUS | Status: AC
  Administered 2018-10-03: 600 mg via INTRAVENOUS
  Filled 2018-10-03: qty 20

## 2018-10-03 NOTE — Progress Notes (Signed)
[place] OFFICE PROGRESS NOTE   Diagnosis: Colon cancer  INTERVAL HISTORY:   Brenda Becker returns as scheduled.  She completed another treatment with Panitumumab on 09/05/2018.  She reports a stable skin rash.  She continues to have intermittent diarrhea.  She is taking magnesium.  No new complaint.  Good appetite.  She is working.  She reports intentional weight loss.  Objective:  Vital signs in last 24 hours:  Blood pressure 128/88, pulse 85, temperature 98.7 F (37.1 C), temperature source Oral, resp. rate 18, height 5' 4.5" (1.638 m), weight 213 lb 6.4 oz (96.8 kg), SpO2 100 %.    Limited physical examination secondary to distancing with the COVID pandemic GI: No hepatomegaly, nontender Vascular: No leg edema  Skin: Acne type rash over the face and trunk, mild area of paronychia and a right finger  Portacath/PICC-without erythema  Lab Results:  Lab Results  Component Value Date   WBC 6.0 10/03/2018   HGB 13.0 10/03/2018   HCT 38.7 10/03/2018   MCV 85.6 10/03/2018   PLT 251 10/03/2018   NEUTROABS 2.9 10/03/2018    CMP  Lab Results  Component Value Date   NA 139 10/03/2018   K 4.2 10/03/2018   CL 108 10/03/2018   CO2 21 (L) 10/03/2018   GLUCOSE 81 10/03/2018   BUN 12 10/03/2018   CREATININE 1.21 (H) 10/03/2018   CALCIUM 9.2 10/03/2018   PROT 7.1 10/03/2018   ALBUMIN 3.5 10/03/2018   AST 20 10/03/2018   ALT 18 10/03/2018   ALKPHOS 102 10/03/2018   BILITOT 0.3 10/03/2018   GFRNONAA 53 (L) 10/03/2018   GFRAA >60 10/03/2018  Magnesium 1.8  Lab Results  Component Value Date   CEA1 <1.00 11/22/2017     Medications: I have reviewed the patient's current medications.   Assessment/Plan: 1.  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  Cycle 2 CAPOX 01/22/2015  Cycle 3 CAPOX 02/12/2015  Cycle 4 CAPOX 03/05/2015  Cycle 5 CAPOX 03/26/2015  Cycle 6 CAPOX 04/16/2015  Cycle 7 CAPOX 05/07/2015 (oxaliplatin held due to progressive neuropathy)  Cycle 8 CAPOX 05/28/2015 (oxaliplatin held secondary to neuropathy)  CTs 11/01/2015-no significant change and an 11 mm upper abdomen mesenteric lymph node with partial calcification-treated metastasis?, No other evidence of metastatic disease  CTs 01/21/2016-mild interval progression of previously identified calcified nodal tissue in the upper central abdomen. Development of small adjacent lymph nodes in the interval.  EUS guided biopsy of the calcified upper abdominal mass on 02/17/2016 confirmed metastatic adenocarcinoma  Cycle 1 FOLFIRI/Avastin 03/09/2016  Cycle 2 FOLFIRI/Avastin 03/23/2016  Cycle 3 FOLFIRI/Avastin 04/06/2016 (Neulasta added)  Cycle 4 FOLFIRI/Avastin 04/20/2016  Cycle 5 FOLFIRI/Avastin 05/04/2016  Cycle 6 FOLFIRI/Avastin 05/18/2016  CT abdomen/pelvis 05/30/2016-new retroperitoneal lymphadenopathy in the para-aortic region with the largest node measuring 1.5 cm. Partially calcified mesenteric lymph node measures 12 mm as compared to 14 mm previously. Other scattered less than 1 cmmesenteric lymph nodes remain stable.  PET scan 06/13/2016-hypermetabolic posterior mediastinal node, hypermetabolic retroperitoneal lymph nodes, stable transverse mesocolon mass, bilateral hypermetabolic inguinal nodes  EUS biopsy of posterior mediastinal lymph nodes on 07/20/2016-positive for metastatic adenocarcinoma  Clinical trial at [facility], [id number] Panitumumab, nivolumab, ipilimumab.  Restaging CTs 12/18/2017-decreased size of right lower lobe nodules, mediastinal, retroperitoneal, and  mesenteric lymph nodes consistent with a response to therapy  Treatment placed on hold  February 2019 due to immunotherapy-induced hepatitis  Restaging CTs 03/12/2017-no progressive findings to suggest new recurrent or metastatic disease in the abdomen or pelvis. No change in small calcified mesenteric and retroperitoneal abdominal lymph nodes. No evidence of metastatic disease in the chest.  Maintenance panitumumab beginning 03/29/2017, healed 05/24/2017 secondary to elevated liver enzymes-resumed 06/07/2017  Restaging CTs at [facility] 06/04/2017-no evidence of disease progression, unchanged scattered mesenteric/retroperitoneal lymph nodes  Panitumumab every 2 weeks  Restaging CTs 08/27/2017-no evidence of metastatic disease in the chest;unchanged size and appearance of scattered calcified and noncalcified mesenteric and retroperitoneal lymph nodes. Near complete resolution of previously described haziness within the central mesentery. No new evidence of recurrent or metastatic disease within the abdomen or pelvis.  Continue Panitumumab every 2 weeks  Restaging CTs 01/01/2018-stable lymph nodes within the posterior mediastinum, retroperitoneum and transverse mesocolon  Continue Panitumumab every 2 weeks  Restaging CT 07/08/2018- no evidence of metastatic disease, we noted small retroperitoneal/mesenteric nodes are calcified  Every 2-week panitumumab continued   Panitumumab changed to monthly dosing beginning 08/08/2018 2. History of Microcytic anemia-likely iron deficiency anemia secondary to #1 and ongoing menses 3. Status post cholecystectomy 4. Family history of breast and ovarian cancer. She has seen the genetics counselor. 5. Delayed nausea following cycle 1 CAPOX. Aloxi and Emend added beginning with cycle 2. 6. Transient episodes of dyspnea following cycle 4 and cycle 5 CAPOX, likely neurotoxicity from oxaliplatin 7. Oxaliplatin neuropathy-improved 8. Mild neutropenia secondary to chemotherapy-Neulasta added beginning with cycle 3 FOLFIRI/Avastin 9. Mild mucositis following  cycle 4 FOLFIRI/Avastin 10. Mammogram 05/11/2016-possible mass right breast. Right breast ultrasound 05/17/2016-right breast mass at 10:00 most likely represents a cyst, however is somewhat hypoechoic and could also represent a fibroadenoma. Ultrasound-guided aspiration of a 1 cmbenign cyst 05/22/2016. 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 12. Hypomagnesia secondary to Panitumumab. She continues magnesium oxide. 13. Elevated liver enzymes May 2019-likely secondary to resuming atorvastatin, now normal 14. History of paronychia related to Panitumumab.     Disposition: [facility] Becker appears stable.  She will continue monthly Panitumumab.  The plan is to schedule restaging CTs in 3-4 months.  B[facility]da Becker will return for an office visit and Panitumumab in 1 month.  Betsy Coder, MD  10/03/2018  1:55 PM

## 2018-10-03 NOTE — Patient Instructions (Signed)
[facility] Discharge Instructions for Patients Receiving Chemotherapy  Today you received the following chemotherapy agents: Vectibix.  To help prevent nausea and vomiting after your treatment, we encourage you to take your nausea medication as directed.   If you develop nausea and vomiting that is not controlled by your nausea medication, call the clinic.   BELOW ARE SYMPTOMS THAT SHOULD BE REPORTED IMMEDIATELY:  *FEVER GREATER THAN 100.5 F  *CHILLS WITH OR WITHOUT FEVER  NAUSEA AND VOMITING THAT IS NOT CONTROLLED WITH YOUR NAUSEA MEDICATION  *UNUSUAL SHORTNESS OF BREATH  *UNUSUAL BRUISING OR BLEEDING  TENDERNESS IN MOUTH AND THROAT WITH OR WITHOUT PRESENCE OF ULCERS  *URINARY PROBLEMS  *BOWEL PROBLEMS  UNUSUAL RASH Items with * indicate a potential emergency and should be followed up as soon as possible.  Feel free to call the clinic should you have any questions or concerns. The clinic phone number is [phone].  Please show the CHEMO ALERT CARD at check-in to the Emergency Department and triage nurse.   

## 2018-10-07 ENCOUNTER — Telehealth: Payer: Self-pay | Admitting: Oncology

## 2018-10-07 NOTE — Telephone Encounter (Signed)
Scheduled per los. Called and left msg. Mailed printout  °

## 2018-10-09 ENCOUNTER — Other Ambulatory Visit: Payer: Self-pay | Admitting: Oncology

## 2018-10-26 ENCOUNTER — Other Ambulatory Visit: Payer: Self-pay | Admitting: Oncology

## 2018-10-31 ENCOUNTER — Inpatient Hospital Stay (HOSPITAL_BASED_OUTPATIENT_CLINIC_OR_DEPARTMENT_OTHER): Payer: 59 | Admitting: Nurse Practitioner

## 2018-10-31 ENCOUNTER — Inpatient Hospital Stay: Payer: 59

## 2018-10-31 ENCOUNTER — Encounter: Payer: Self-pay | Admitting: Nurse Practitioner

## 2018-10-31 ENCOUNTER — Other Ambulatory Visit: Payer: Self-pay

## 2018-10-31 VITALS — BP 138/91 | HR 90 | Temp 98.0°F | Resp 18 | Ht 64.5 in | Wt 213.2 lb

## 2018-10-31 DIAGNOSIS — C184 Malignant neoplasm of transverse colon: Secondary | ICD-10-CM

## 2018-10-31 DIAGNOSIS — Z95828 Presence of other vascular implants and grafts: Secondary | ICD-10-CM

## 2018-10-31 DIAGNOSIS — C189 Malignant neoplasm of colon, unspecified: Secondary | ICD-10-CM

## 2018-10-31 DIAGNOSIS — Z5112 Encounter for antineoplastic immunotherapy: Secondary | ICD-10-CM | POA: Diagnosis not present

## 2018-10-31 LAB — CMP (CANCER CENTER ONLY)
ALT: 30 U/L (ref 0–44)
AST: 24 U/L (ref 15–41)
Albumin: 3.8 g/dL (ref 3.5–5.0)
Alkaline Phosphatase: 95 U/L (ref 38–126)
Anion gap: 10 (ref 5–15)
BUN: 13 mg/dL (ref 6–20)
CO2: 24 mmol/L (ref 22–32)
Calcium: 9.3 mg/dL (ref 8.9–10.3)
Chloride: 106 mmol/L (ref 98–111)
Creatinine: 1.26 mg/dL — ABNORMAL HIGH (ref 0.44–1.00)
GFR, Est AFR Am: 58 mL/min — ABNORMAL LOW (ref >60)
GFR, Estimated: 50 mL/min — ABNORMAL LOW (ref >60)
Glucose, Bld: 98 mg/dL (ref 70–99)
Potassium: 3.9 mmol/L (ref 3.5–5.1)
Sodium: 140 mmol/L (ref 135–145)
Total Bilirubin: 0.3 mg/dL (ref 0.3–1.2)
Total Protein: 6.7 g/dL (ref 6.5–8.1)

## 2018-10-31 LAB — MAGNESIUM: Magnesium: 1.8 mg/dL (ref 1.7–2.4)

## 2018-10-31 MED ORDER — SODIUM CHLORIDE 0.9% FLUSH
10.0000 mL | Freq: Once | INTRAVENOUS | Status: AC
  Administered 2018-10-31: 10 mL
  Filled 2018-10-31: qty 10

## 2018-10-31 MED ORDER — SODIUM CHLORIDE 0.9 % IV SOLN
6.0000 mg/kg | Freq: Once | INTRAVENOUS | Status: AC
  Administered 2018-10-31: 600 mg via INTRAVENOUS
  Filled 2018-10-31: qty 10

## 2018-10-31 MED ORDER — HEPARIN SOD (PORK) LOCK FLUSH 100 UNIT/ML IV SOLN
500.0000 [IU] | Freq: Once | INTRAVENOUS | Status: AC | PRN
  Administered 2018-10-31: 500 [IU]
  Filled 2018-10-31: qty 5

## 2018-10-31 MED ORDER — SODIUM CHLORIDE 0.9% FLUSH
10.0000 mL | INTRAVENOUS | Status: DC | PRN
  Administered 2018-10-31: 10 mL
  Filled 2018-10-31: qty 10

## 2018-10-31 MED ORDER — SODIUM CHLORIDE 0.9 % IV SOLN
Freq: Once | INTRAVENOUS | Status: AC
  Administered 2018-10-31: 13:00:00 via INTRAVENOUS
  Filled 2018-10-31: qty 250

## 2018-10-31 NOTE — Progress Notes (Signed)
[place] OFFICE PROGRESS NOTE   Diagnosis: Colon cancer  INTERVAL HISTORY:   Ms. [name] returns as scheduled.  She completed another treatment with Panitumumab 10/03/2018.  She denies nausea/vomiting.  No mouth sores.  No diarrhea.  She denies pain.  She has a good appetite.  Skin rash overall is stable.  She notes excessive hair growth on her face.  Objective:  Vital signs in last 24 hours:  Blood pressure (!) 138/91, pulse 90, temperature 98 F (36.7 C), temperature source Temporal, resp. rate 18, height 5' 4.5" (1.638 m), weight 213 lb 3.2 oz (96.7 kg), SpO2 100 %.    HEENT: No thrush or ulcers. GI: Abdomen soft and nontender.  No hepatomegaly. Vascular: No leg edema. Neuro: Alert and oriented. Skin: Mild acne type rash scattered over the face. Port-A-Cath without erythema.   Lab Results:  Lab Results  Component Value Date   WBC 6.0 10/03/2018   HGB 13.0 10/03/2018   HCT 38.7 10/03/2018   MCV 85.6 10/03/2018   PLT 251 10/03/2018   NEUTROABS 2.9 10/03/2018    Imaging:  No results found.  Medications: I have reviewed the patient's current medications.  Assessment/Plan: 1. 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  Cycle 2 CAPOX 01/22/2015  Cycle 3 CAPOX 02/12/2015  Cycle 4 CAPOX 03/05/2015  Cycle 5 CAPOX 03/26/2015  Cycle 6 CAPOX 04/16/2015  Cycle 7 CAPOX 05/07/2015 (oxaliplatin held due to progressive neuropathy)  Cycle 8 CAPOX 05/28/2015 (oxaliplatin held secondary to neuropathy)  CTs 11/01/2015-no significant change and an 11 mm upper abdomen mesenteric lymph node with partial calcification-treated metastasis?, No other evidence of  metastatic disease  CTs 01/21/2016-mild interval progression of previously identified calcified nodal tissue in the upper central abdomen. Development of small adjacent lymph nodes in the interval.  EUS guided biopsy of the calcified upper abdominal mass on 02/17/2016 confirmed metastatic adenocarcinoma  Cycle 1 FOLFIRI/Avastin 03/09/2016  Cycle 2 FOLFIRI/Avastin 03/23/2016  Cycle 3 FOLFIRI/Avastin 04/06/2016 (Neulasta added)  Cycle 4 FOLFIRI/Avastin 04/20/2016  Cycle 5 FOLFIRI/Avastin 05/04/2016  Cycle 6 FOLFIRI/Avastin 05/18/2016  CT abdomen/pelvis 05/30/2016-new retroperitoneal lymphadenopathy in the para-aortic region with the largest node measuring 1.5 cm. Partially calcified mesenteric lymph node measures 12 mm as compared to 14 mm previously. Other scattered less than 1 cmmesenteric lymph nodes remain stable.  PET scan 06/13/2016-hypermetabolic posterior mediastinal node, hypermetabolic retroperitoneal lymph nodes, stable transverse mesocolon mass, bilateral hypermetabolic inguinal nodes  EUS biopsy of posterior mediastinal lymph nodes on 07/20/2016-positive for metastatic adenocarcinoma  Clinical trial at [facility], [id number] Panitumumab, nivolumab, ipilimumab.  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 No change in small calcified mesenteric and retroperitoneal abdominal lymph nodes. No evidence of metastatic disease in the chest.  Maintenance panitumumab beginning 03/29/2017, healed 05/24/2017 secondary to elevated liver enzymes-resumed 06/07/2017  [facility]aging CTs at [facility] 06/04/2017-no evidence of disease progression, unchanged scattered  mesenteric/retroperitoneal lymph nodes  Panitumumab every 2 weeks  Restaging CTs 08/27/2017-no evidence of metastatic disease in the chest;unchanged size and appearance of scattered calcified and noncalcified mesenteric  and retroperitoneal lymph nodes. Near complete resolution of previously described haziness within the central mesentery. No new evidence of recurrent or metastatic disease within the abdomen or pelvis.  Continue Panitumumab every 2 weeks  Restaging CTs 01/01/2018-stable lymph nodes within the posterior mediastinum, retroperitoneum and transverse mesocolon  Continue Panitumumab every 2 weeks  Restaging CT 07/08/2018-no evidence of metastatic disease, we noted small retroperitoneal/mesenteric nodes are calcified  Every 2-week panitumumab continued  Panitumumab changed to monthly dosing beginning 08/08/2018 2. History of Microcytic anemia-likely iron deficiency anemia secondary to #1 and ongoing menses 3. Status post cholecystectomy 4. Family history of breast and ovarian cancer. She has seen the genetics counselor. 5. Delayed nausea following cycle 1 CAPOX. Aloxi and Emend added beginning with cycle 2. 6. Transient episodes of dyspnea following cycle 4 and cycle 5 CAPOX, likely neurotoxicity from oxaliplatin 7. Oxaliplatin neuropathy-improved 8. Mild neutropenia secondary to chemotherapy-Neulasta added beginning with cycle 3 FOLFIRI/Avastin 9. Mild mucositis following cycle 4 FOLFIRI/Avastin 10. Mammogram 05/11/2016-possible mass right breast. Right breast ultrasound 05/17/2016-right breast mass at 10:00 most likely represents a cyst, however is somewhat hypoechoic and could also represent a fibroadenoma. Ultrasound-guided aspiration of a 1 cmbenign cyst 05/22/2016. 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 12. Hypomagnesia secondary to Panitumumab. She continues magnesium oxide. 13. Elevated liver enzymes May 2019-likely secondary to  resuming atorvastatin, now normal 14. History of paronychia related to Panitumumab.    Disposition: Brenda Becker appears stable.  She continues monthly Panitumumab.  There is no clinical evidence of disease progression.  Plan to continue the same.  She will return for lab, follow-up and Panitumumab in 1 month.  She will contact the office in the interim with any problems.    [name]   10/31/2018  12:33 PM

## 2018-10-31 NOTE — Patient Instructions (Signed)
[facility] Discharge Instructions for Patients Receiving Chemotherapy  Today you received the following chemotherapy agents: Panitumumab  To help prevent nausea and vomiting after your treatment, we encourage you to take your nausea medication as prescribed.    If you develop nausea and vomiting that is not controlled by your nausea medication, call the clinic.   BELOW ARE SYMPTOMS THAT SHOULD BE REPORTED IMMEDIATELY:  *FEVER GREATER THAN 100.5 F  *CHILLS WITH OR WITHOUT FEVER  NAUSEA AND VOMITING THAT IS NOT CONTROLLED WITH YOUR NAUSEA MEDICATION  *UNUSUAL SHORTNESS OF BREATH  *UNUSUAL BRUISING OR BLEEDING  TENDERNESS IN MOUTH AND THROAT WITH OR WITHOUT PRESENCE OF ULCERS  *URINARY PROBLEMS  *BOWEL PROBLEMS  UNUSUAL RASH Items with * indicate a potential emergency and should be followed up as soon as possible.  Feel free to call the clinic should you have any questions or concerns. The clinic phone number is [phone].  Please show the CHEMO ALERT CARD at check-in to the Emergency Department and triage nurse.   

## 2018-10-31 NOTE — Patient Instructions (Signed)

## 2018-11-01 ENCOUNTER — Telehealth: Payer: Self-pay | Admitting: Oncology

## 2018-11-01 NOTE — Telephone Encounter (Signed)
Scheduled per los. Called and spoke with patient. Confirmed appt 

## 2018-11-20 ENCOUNTER — Other Ambulatory Visit: Payer: Self-pay | Admitting: Nurse Practitioner

## 2018-11-20 DIAGNOSIS — C189 Malignant neoplasm of colon, unspecified: Secondary | ICD-10-CM

## 2018-11-24 ENCOUNTER — Other Ambulatory Visit: Payer: Self-pay | Admitting: Oncology

## 2018-11-26 ENCOUNTER — Inpatient Hospital Stay: Payer: 59

## 2018-11-26 ENCOUNTER — Inpatient Hospital Stay: Payer: 59 | Attending: Oncology | Admitting: Oncology

## 2018-11-26 ENCOUNTER — Other Ambulatory Visit: Payer: Self-pay

## 2018-11-26 VITALS — BP 133/91 | HR 90 | Temp 98.0°F | Resp 17 | Ht 64.5 in | Wt 206.9 lb

## 2018-11-26 DIAGNOSIS — C189 Malignant neoplasm of colon, unspecified: Secondary | ICD-10-CM

## 2018-11-26 DIAGNOSIS — C184 Malignant neoplasm of transverse colon: Secondary | ICD-10-CM

## 2018-11-26 DIAGNOSIS — Z5112 Encounter for antineoplastic immunotherapy: Secondary | ICD-10-CM | POA: Insufficient documentation

## 2018-11-26 DIAGNOSIS — Z95828 Presence of other vascular implants and grafts: Secondary | ICD-10-CM

## 2018-11-26 LAB — CMP (CANCER CENTER ONLY)
ALT: 28 U/L (ref 0–44)
AST: 23 U/L (ref 15–41)
Albumin: 3.9 g/dL (ref 3.5–5.0)
Alkaline Phosphatase: 99 U/L (ref 38–126)
Anion gap: 11 (ref 5–15)
BUN: 11 mg/dL (ref 6–20)
CO2: 27 mmol/L (ref 22–32)
Calcium: 9.2 mg/dL (ref 8.9–10.3)
Chloride: 106 mmol/L (ref 98–111)
Creatinine: 1.33 mg/dL — ABNORMAL HIGH (ref 0.44–1.00)
GFR, Est AFR Am: 55 mL/min — ABNORMAL LOW (ref >60)
GFR, Estimated: 47 mL/min — ABNORMAL LOW (ref >60)
Glucose, Bld: 125 mg/dL — ABNORMAL HIGH (ref 70–99)
Potassium: 3.5 mmol/L (ref 3.5–5.1)
Sodium: 144 mmol/L (ref 135–145)
Total Bilirubin: 0.5 mg/dL (ref 0.3–1.2)
Total Protein: 7 g/dL (ref 6.5–8.1)

## 2018-11-26 LAB — CBC WITH DIFFERENTIAL (CANCER CENTER ONLY)
Abs Immature Granulocytes: 0.01 10*3/uL (ref 0.00–0.07)
Basophils Absolute: 0.1 10*3/uL (ref 0.0–0.1)
Basophils Relative: 1 %
Eosinophils Absolute: 0.3 10*3/uL (ref 0.0–0.5)
Eosinophils Relative: 4 %
HCT: 41.3 % (ref 36.0–46.0)
Hemoglobin: 13.5 g/dL (ref 12.0–15.0)
Immature Granulocytes: 0 %
Lymphocytes Relative: 36 %
Lymphs Abs: 2.1 10*3/uL (ref 0.7–4.0)
MCH: 28.2 pg (ref 26.0–34.0)
MCHC: 32.7 g/dL (ref 30.0–36.0)
MCV: 86.2 fL (ref 80.0–100.0)
Monocytes Absolute: 0.4 10*3/uL (ref 0.1–1.0)
Monocytes Relative: 7 %
Neutro Abs: 3 10*3/uL (ref 1.7–7.7)
Neutrophils Relative %: 52 %
Platelet Count: 240 10*3/uL (ref 150–400)
RBC: 4.79 MIL/uL (ref 3.87–5.11)
RDW: 12.9 % (ref 11.5–15.5)
WBC Count: 5.8 10*3/uL (ref 4.0–10.5)
nRBC: 0 % (ref 0.0–0.2)

## 2018-11-26 LAB — MAGNESIUM: Magnesium: 1.8 mg/dL (ref 1.7–2.4)

## 2018-11-26 MED ORDER — HEPARIN SOD (PORK) LOCK FLUSH 100 UNIT/ML IV SOLN
500.0000 [IU] | Freq: Once | INTRAVENOUS | Status: AC | PRN
  Administered 2018-11-26: 500 [IU]
  Filled 2018-11-26: qty 5

## 2018-11-26 MED ORDER — SODIUM CHLORIDE 0.9 % IV SOLN
6.0000 mg/kg | Freq: Once | INTRAVENOUS | Status: AC
  Administered 2018-11-26: 600 mg via INTRAVENOUS
  Filled 2018-11-26: qty 20

## 2018-11-26 MED ORDER — SODIUM CHLORIDE 0.9% FLUSH
10.0000 mL | Freq: Once | INTRAVENOUS | Status: AC
  Administered 2018-11-26: 10 mL
  Filled 2018-11-26: qty 10

## 2018-11-26 MED ORDER — SODIUM CHLORIDE 0.9% FLUSH
10.0000 mL | INTRAVENOUS | Status: DC | PRN
  Administered 2018-11-26: 10 mL
  Filled 2018-11-26: qty 10

## 2018-11-26 MED ORDER — SODIUM CHLORIDE 0.9 % IV SOLN
Freq: Once | INTRAVENOUS | Status: AC
  Administered 2018-11-26: 12:00:00 via INTRAVENOUS
  Filled 2018-11-26: qty 250

## 2018-11-26 NOTE — Patient Instructions (Signed)

## 2018-11-26 NOTE — Progress Notes (Signed)
[place] OFFICE PROGRESS NOTE   Diagnosis: Colon cancer  INTERVAL HISTORY:   [name] returns as scheduled.  She completed another treatment with Panitumumab on 10/31/2018.  She reports increased rash at the face.  She has several areas of paronychia at the fingers.  Good appetite.  She reports intentional weight loss.  She has diarrhea several days per week, usually once daily.  Objective:  Vital signs in last 24 hours:  Blood pressure (!) 133/91, pulse 90, temperature 98 F (36.7 C), temperature source Temporal, resp. rate 17, height 5' 4.5" (1.638 m), weight 206 lb 14.4 oz (93.8 kg), SpO2 100 %.    Limited physical examination secondary to distancing with the Covid pandemic GI: No hepatomegaly, no mass, nontender Vascular: No leg edema  Skin: Acne type rash over the trunk and face, few linear ulcerations at the fingertips  Portacath/PICC-without erythema  Lab Results:  Lab Results  Component Value Date   WBC 5.8 11/26/2018   HGB 13.5 11/26/2018   HCT 41.3 11/26/2018   MCV 86.2 11/26/2018   PLT 240 11/26/2018   NEUTROABS 3.0 11/26/2018    CMP  Lab Results  Component Value Date   NA 144 11/26/2018   K 3.5 11/26/2018   CL 106 11/26/2018   CO2 27 11/26/2018   GLUCOSE 125 (H) 11/26/2018   BUN 11 11/26/2018   CREATININE 1.33 (H) 11/26/2018   CALCIUM 9.2 11/26/2018   PROT 7.0 11/26/2018   ALBUMIN 3.9 11/26/2018   AST 23 11/26/2018   ALT 28 11/26/2018   ALKPHOS 99 11/26/2018   BILITOT 0.5 11/26/2018   GFRNONAA 47 (L) 11/26/2018   GFRAA 55 (L) 11/26/2018    Lab Results  Component Value Date   CEA1 <1.00 11/22/2017    Medications: I have reviewed the patient's current medications.   Assessment/Plan: 1. 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  Cycle 2 CAPOX 01/22/2015  Cycle 3 CAPOX 02/12/2015  Cycle 4 CAPOX 03/05/2015  Cycle 5 CAPOX 03/26/2015  Cycle 6 CAPOX 04/16/2015  Cycle 7 CAPOX 05/07/2015 (oxaliplatin held due to progressive neuropathy)  Cycle 8 CAPOX 05/28/2015 (oxaliplatin held secondary to neuropathy)  CTs 11/01/2015-no significant change and an 11 mm upper abdomen mesenteric lymph node with partial calcification-treated metastasis?, No other evidence of metastatic disease  CTs 01/21/2016-mild interval progression of previously identified calcified nodal tissue in the upper central abdomen. Development of small adjacent lymph nodes in the interval.  EUS guided biopsy of the calcified upper abdominal mass on 02/17/2016 confirmed metastatic adenocarcinoma  Cycle 1 FOLFIRI/Avastin 03/09/2016  Cycle 2 FOLFIRI/Avastin 03/23/2016  Cycle 3 FOLFIRI/Avastin 04/06/2016 (Neulasta added)  Cycle 4 FOLFIRI/Avastin 04/20/2016  Cycle 5 FOLFIRI/Avastin 05/04/2016  Cycle 6 FOLFIRI/Avastin 05/18/2016  CT abdomen/pelvis 05/30/2016-new retroperitoneal lymphadenopathy in the para-aortic region with the largest node measuring 1.5 cm. Partially calcified mesenteric lymph node measures 12 mm as compared to 14 mm previously. Other scattered less than 1 cmmesenteric lymph nodes remain stable.  PET scan 06/13/2016-hypermetabolic posterior mediastinal node, hypermetabolic retroperitoneal lymph nodes, stable transverse mesocolon mass, bilateral hypermetabolic inguinal nodes  EUS biopsy of posterior mediastinal lymph nodes on 07/20/2016-positive for metastatic adenocarcinoma  Clinical trial at [facility], [id number] Panitumumab, nivolumab, ipilimumab.  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 No change in small calcified mesenteric and retroperitoneal abdominal lymph nodes. No evidence of metastatic disease in the chest.  Maintenance panitumumab beginning 03/29/2017, healed 05/24/2017 secondary to elevated liver enzymes-resumed 06/07/2017  [facility] 06/04/2017-no evidence of disease progression, unchanged scattered mesenteric/retroperitoneal lymph nodes  Panitumumab every 2 weeks  Restaging CTs 08/27/2017-no evidence of metastatic disease in the chest;unchanged size and appearance of scattered calcified and noncalcified mesenteric and retroperitoneal lymph nodes. Near complete resolution of previously described haziness within the central mesentery. No new evidence of recurrent or metastatic disease within the abdomen or pelvis.  Continue Panitumumab every 2 weeks  Restaging CTs 01/01/2018-stable lymph nodes within the posterior mediastinum, retroperitoneum and transverse mesocolon  Continue Panitumumab every 2 weeks  Restaging CT 07/08/2018-no evidence of metastatic disease, we noted small retroperitoneal/mesenteric nodes are calcified  Every 2-week panitumumab continued  Panitumumab changed to monthly dosing beginning 08/08/2018 2. History of Microcytic anemia-likely iron deficiency anemia secondary to #1 and ongoing menses 3. Status post cholecystectomy 4. Family history of breast and ovarian cancer. She has seen the genetics counselor. 5. Delayed nausea following cycle 1 CAPOX. Aloxi and Emend added beginning with cycle 2. 6. Transient episodes of dyspnea following cycle 4 and cycle 5 CAPOX, likely neurotoxicity from oxaliplatin 7. Oxaliplatin neuropathy-improved 8. Mild neutropenia secondary to chemotherapy-Neulasta added beginning with cycle 3 FOLFIRI/Avastin 9.  Mild mucositis following cycle 4 FOLFIRI/Avastin 10. Mammogram 05/11/2016-possible mass right breast. Right breast ultrasound 05/17/2016-right breast mass at 10:00 most likely represents a cyst, however is somewhat hypoechoic and could also represent a fibroadenoma. Ultrasound-guided aspiration of a 1 cmbenign cyst 05/22/2016. 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 12. Hypomagnesia secondary to Panitumumab. She continues magnesium oxide. 13. Elevated liver enzymes May 2019-likely secondary to resuming atorvastatin, now normal 14. History of paronychia related to Panitumumab.    Disposition: Brenda Becker appears stable.  She will continue monthly Panitumumab.  She will return for an office visit and Panitumumab in 1 month.  She will be scheduled for restaging CTs during the last week of December.  The creatinine is mildly elevated.  This is most likely related to diarrhea.  I encouraged her to increase fluid intake.  She continues potassium and magnesium supplements.  Betsy Coder, MD  11/26/2018  11:04 AM

## 2018-11-26 NOTE — Patient Instructions (Signed)
[facility] Discharge Instructions for Patients Receiving Chemotherapy  Today you received the following chemotherapy agents: Vectibix.  To help prevent nausea and vomiting after your treatment, we encourage you to take your nausea medication as directed.   If you develop nausea and vomiting that is not controlled by your nausea medication, call the clinic.   BELOW ARE SYMPTOMS THAT SHOULD BE REPORTED IMMEDIATELY:  *FEVER GREATER THAN 100.5 F  *CHILLS WITH OR WITHOUT FEVER  NAUSEA AND VOMITING THAT IS NOT CONTROLLED WITH YOUR NAUSEA MEDICATION  *UNUSUAL SHORTNESS OF BREATH  *UNUSUAL BRUISING OR BLEEDING  TENDERNESS IN MOUTH AND THROAT WITH OR WITHOUT PRESENCE OF ULCERS  *URINARY PROBLEMS  *BOWEL PROBLEMS  UNUSUAL RASH Items with * indicate a potential emergency and should be followed up as soon as possible.  Feel free to call the clinic should you have any questions or concerns. The clinic phone number is [phone].  Please show the CHEMO ALERT CARD at check-in to the Emergency Department and triage nurse.   

## 2018-12-06 ENCOUNTER — Telehealth: Payer: Self-pay | Admitting: Oncology

## 2018-12-06 NOTE — Telephone Encounter (Signed)
Returned patient's phone call regarding rescheduling 12/23 appointment, per patient's request appointment has moved to 12/28.  Message to provider.

## 2018-12-22 ENCOUNTER — Other Ambulatory Visit: Payer: Self-pay | Admitting: Oncology

## 2018-12-24 ENCOUNTER — Other Ambulatory Visit: Payer: Self-pay

## 2018-12-24 ENCOUNTER — Ambulatory Visit (HOSPITAL_COMMUNITY)
Admission: RE | Admit: 2018-12-24 | Discharge: 2018-12-24 | Disposition: A | Discharge status: Home / Self Care | Payer: 59 | Source: Ambulatory Visit | Attending: Oncology | Admitting: Oncology

## 2018-12-24 DIAGNOSIS — C184 Malignant neoplasm of transverse colon: Secondary | ICD-10-CM | POA: Diagnosis present

## 2018-12-24 MED ORDER — IOHEXOL 300 MG/ML  SOLN
100.0000 mL | Freq: Once | INTRAMUSCULAR | Status: AC | PRN
  Administered 2018-12-24: 10:00:00 100 mL via INTRAVENOUS

## 2018-12-24 MED ORDER — IOHEXOL 9 MG/ML PO SOLN
ORAL | Status: AC
  Administered 2018-12-24: 08:00:00 1000 mL via ORAL
  Filled 2018-12-24: qty 1000

## 2018-12-24 MED ORDER — SODIUM CHLORIDE (PF) 0.9 % IJ SOLN
INTRAMUSCULAR | Status: AC
  Filled 2018-12-24: qty 50

## 2018-12-24 MED ORDER — IOHEXOL 9 MG/ML PO SOLN: 1000.0000 mL | ORAL | Status: AC

## 2018-12-25 ENCOUNTER — Other Ambulatory Visit: Payer: 59

## 2018-12-25 ENCOUNTER — Ambulatory Visit: Payer: 59 | Admitting: Oncology

## 2018-12-25 ENCOUNTER — Telehealth: Payer: Self-pay | Admitting: *Deleted

## 2018-12-25 ENCOUNTER — Ambulatory Visit: Payer: 59

## 2018-12-25 NOTE — Telephone Encounter (Signed)
-----   Message from Ladell Pier, MD sent at 12/25/2018  7:21 AM EST ----- Please call patient, there is a new small lymph node in the central abdomen, no other evidence of recurrent colon cancer, follow-up as scheduled

## 2018-12-25 NOTE — Telephone Encounter (Signed)
Informed of CT results and that MD said it could be something or may not be. Just warrants watching for now. F/U as scheduled.

## 2018-12-26 ENCOUNTER — Other Ambulatory Visit: Payer: Self-pay | Admitting: *Deleted

## 2018-12-30 ENCOUNTER — Other Ambulatory Visit: Payer: Self-pay

## 2018-12-30 ENCOUNTER — Inpatient Hospital Stay: Payer: 59 | Attending: Oncology

## 2018-12-30 ENCOUNTER — Inpatient Hospital Stay: Payer: 59

## 2018-12-30 ENCOUNTER — Inpatient Hospital Stay (HOSPITAL_BASED_OUTPATIENT_CLINIC_OR_DEPARTMENT_OTHER): Payer: 59 | Admitting: Oncology

## 2018-12-30 VITALS — BP 137/87 | HR 100 | Temp 98.2°F | Resp 17 | Ht 64.5 in | Wt 205.5 lb

## 2018-12-30 DIAGNOSIS — C184 Malignant neoplasm of transverse colon: Secondary | ICD-10-CM | POA: Insufficient documentation

## 2018-12-30 DIAGNOSIS — Z95828 Presence of other vascular implants and grafts: Secondary | ICD-10-CM

## 2018-12-30 DIAGNOSIS — Z5112 Encounter for antineoplastic immunotherapy: Secondary | ICD-10-CM | POA: Insufficient documentation

## 2018-12-30 DIAGNOSIS — C189 Malignant neoplasm of colon, unspecified: Secondary | ICD-10-CM

## 2018-12-30 LAB — CMP (CANCER CENTER ONLY)
ALT: 20 U/L (ref 0–44)
AST: 19 U/L (ref 15–41)
Albumin: 3.7 g/dL (ref 3.5–5.0)
Alkaline Phosphatase: 104 U/L (ref 38–126)
Anion gap: 7 (ref 5–15)
BUN: 10 mg/dL (ref 6–20)
CO2: 26 mmol/L (ref 22–32)
Calcium: 8.9 mg/dL (ref 8.9–10.3)
Chloride: 107 mmol/L (ref 98–111)
Creatinine: 1.22 mg/dL — ABNORMAL HIGH (ref 0.44–1.00)
GFR, Est AFR Am: >60 mL/min (ref >60)
GFR, Estimated: 52 mL/min — ABNORMAL LOW (ref >60)
Glucose, Bld: 111 mg/dL — ABNORMAL HIGH (ref 70–99)
Potassium: 3.7 mmol/L (ref 3.5–5.1)
Sodium: 140 mmol/L (ref 135–145)
Total Bilirubin: 0.3 mg/dL (ref 0.3–1.2)
Total Protein: 6.8 g/dL (ref 6.5–8.1)

## 2018-12-30 LAB — MAGNESIUM: Magnesium: 1.7 mg/dL (ref 1.7–2.4)

## 2018-12-30 MED ORDER — HEPARIN SOD (PORK) LOCK FLUSH 100 UNIT/ML IV SOLN
500.0000 [IU] | Freq: Once | INTRAVENOUS | Status: AC | PRN
  Administered 2018-12-30: 500 [IU]
  Filled 2018-12-30: qty 5

## 2018-12-30 MED ORDER — SODIUM CHLORIDE 0.9 % IV SOLN
6.0000 mg/kg | Freq: Once | INTRAVENOUS | Status: AC
  Administered 2018-12-30: 14:00:00 600 mg via INTRAVENOUS
  Filled 2018-12-30: qty 20

## 2018-12-30 MED ORDER — SODIUM CHLORIDE 0.9 % IV SOLN
Freq: Once | INTRAVENOUS | Status: AC
  Filled 2018-12-30: qty 250

## 2018-12-30 MED ORDER — SODIUM CHLORIDE 0.9% FLUSH
10.0000 mL | Freq: Once | INTRAVENOUS | Status: AC
  Administered 2018-12-30: 10 mL
  Filled 2018-12-30: qty 10

## 2018-12-30 MED ORDER — SODIUM CHLORIDE 0.9% FLUSH
10.0000 mL | INTRAVENOUS | Status: DC | PRN
  Administered 2018-12-30: 10 mL
  Filled 2018-12-30: qty 10

## 2018-12-30 NOTE — Progress Notes (Signed)
[place] OFFICE PROGRESS NOTE   Diagnosis: Colon cancer  INTERVAL HISTORY:   Brenda Becker returns for a scheduled visit.  She continues monthly Panitumumab.  Reviewed with Imodium.  No change in the skin rash.  Good appetite.  She reports intentional weight loss.  She is working.  Objective:  Vital signs in last 24 hours:  Blood pressure 137/87, pulse 100, temperature 98.2 F (36.8 C), temperature source Temporal, resp. rate 17, height 5' 4.5" (1.638 m), weight 205 lb 8 oz (93.2 kg), SpO2 100 %.    Limited physical examination secondary to distancing with the Covid pandemic Skin: Palms without erythema, 1 area of skin breakdown between 2 of the left hand fingers  Portacath/PICC-without erythema  Lab Results:  Lab Results  Component Value Date   WBC 5.8 11/26/2018   HGB 13.5 11/26/2018   HCT 41.3 11/26/2018   MCV 86.2 11/26/2018   PLT 240 11/26/2018   NEUTROABS 3.0 11/26/2018    CMP  Lab Results  Component Value Date   NA 140 12/30/2018   K 3.7 12/30/2018   CL 107 12/30/2018   CO2 26 12/30/2018   GLUCOSE 111 (H) 12/30/2018   BUN 10 12/30/2018   CREATININE 1.22 (H) 12/30/2018   CALCIUM 8.9 12/30/2018   PROT 6.8 12/30/2018   ALBUMIN 3.7 12/30/2018   AST 19 12/30/2018   ALT 20 12/30/2018   ALKPHOS 104 12/30/2018   BILITOT 0.3 12/30/2018   GFRNONAA 52 (L) 12/30/2018   GFRAA >60 12/30/2018    Lab Results  Component Value Date   CEA1 <1.00 11/22/2017      Imaging: CT images from 12/24/2018-reviewed with Brenda Becker   Medications: I have reviewed the patient's current medications.   Assessment/Plan: 1. 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  Cycle 2 CAPOX 01/22/2015  Cycle 3 CAPOX 02/12/2015  Cycle 4 CAPOX 03/05/2015  Cycle 5 CAPOX 03/26/2015  Cycle 6 CAPOX 04/16/2015  Cycle 7 CAPOX 05/07/2015 (oxaliplatin held due to progressive neuropathy)  Cycle 8 CAPOX 05/28/2015 (oxaliplatin held secondary to neuropathy)  CTs 11/01/2015-no significant change and an 11 mm upper abdomen mesenteric lymph node with partial calcification-treated metastasis?, No other evidence of metastatic disease  CTs 01/21/2016-mild interval progression of previously identified calcified nodal tissue in the upper central abdomen. Development of small adjacent lymph nodes in the interval.  EUS guided biopsy of the calcified upper abdominal mass on 02/17/2016 confirmed metastatic adenocarcinoma  Cycle 1 FOLFIRI/Avastin 03/09/2016  Cycle 2 FOLFIRI/Avastin 03/23/2016  Cycle 3 FOLFIRI/Avastin 04/06/2016 (Neulasta added)  Cycle 4 FOLFIRI/Avastin 04/20/2016  Cycle 5 FOLFIRI/Avastin 05/04/2016  Cycle 6 FOLFIRI/Avastin 05/18/2016  CT abdomen/pelvis 05/30/2016-new retroperitoneal lymphadenopathy in the para-aortic region with the largest node measuring 1.5 cm. Partially calcified mesenteric lymph node measures 12 mm as compared to 14 mm previously. Other scattered less than 1 cmmesenteric lymph nodes remain stable.  PET scan 06/13/2016-hypermetabolic posterior mediastinal node, hypermetabolic retroperitoneal lymph nodes, stable transverse mesocolon mass, bilateral hypermetabolic inguinal nodes  EUS biopsy of posterior mediastinal lymph nodes on 07/20/2016-positive for metastatic adenocarcinoma  Clinical trial at [facility], [id number] Panitumumab, nivolumab, ipilimumab.  Restaging CTs 12/18/2017-decreased size of right lower lobe nodules, mediastinal, retroperitoneal, and mesenteric lymph nodes consistent with a response to therapy  Treatment placed on hold February  2019 due to immunotherapy-induced hepatitis  Restaging CTs 03/12/2017-no  progressive findings to suggest new recurrent or metastatic disease in the abdomen or pelvis. No change in small calcified mesenteric and retroperitoneal abdominal lymph nodes. No evidence of metastatic disease in the chest.  Maintenance panitumumab beginning 03/29/2017, healed 05/24/2017 secondary to elevated liver enzymes-resumed 06/07/2017  Restaging CTs at [facility] 06/04/2017-no evidence of disease progression, unchanged scattered mesenteric/retroperitoneal lymph nodes  Panitumumab every 2 weeks  Restaging CTs 08/27/2017-no evidence of metastatic disease in the chest;unchanged size and appearance of scattered calcified and noncalcified mesenteric and retroperitoneal lymph nodes. Near complete resolution of previously described haziness within the central mesentery. No new evidence of recurrent or metastatic disease within the abdomen or pelvis.  Continue Panitumumab every 2 weeks  Restaging CTs 01/01/2018-stable lymph nodes within the posterior mediastinum, retroperitoneum and transverse mesocolon  Continue Panitumumab every 2 weeks  Restaging CT 07/08/2018-no evidence of metastatic disease, we noted small retroperitoneal/mesenteric nodes are calcified  Every 2-week panitumumab continued  Panitumumab changed to monthly dosing beginning 08/08/2018  CTs 12/24/2018-new 1.5 cm lesion in the central small bowel mesentery adjacent inferior to the body of the pancreas, suspicious for metastatic lymph node, no other evidence of metastatic disease, indeterminate 7 mm upper pole right kidney lesion 2. History of Microcytic anemia-likely iron deficiency anemia secondary to #1 and ongoing menses 3. Status post cholecystectomy 4. Family history of breast and ovarian cancer. She has seen the genetics counselor. 5. Delayed nausea following cycle 1 CAPOX. Aloxi and Emend added beginning with cycle 2. 6. Transient episodes of dyspnea following cycle 4 and cycle 5 CAPOX, likely neurotoxicity from  oxaliplatin 7. Oxaliplatin neuropathy-improved 8. Mild neutropenia secondary to chemotherapy-Neulasta added beginning with cycle 3 FOLFIRI/Avastin 9. Mild mucositis following cycle 4 FOLFIRI/Avastin 10. Mammogram 05/11/2016-possible mass right breast. Right breast ultrasound 05/17/2016-right breast mass at 10:00 most likely represents a cyst, however is somewhat hypoechoic and could also represent a fibroadenoma. Ultrasound-guided aspiration of a 1 cmbenign cyst 05/22/2016. 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 12. Hypomagnesia secondary to Panitumumab. She continues magnesium oxide. 13. Elevated liver enzymes May 2019-likely secondary to resuming atorvastatin, now normal 14. History of paronychia related to Panitumumab.  Disposition: Brenda Becker appears unchanged.  I reviewed the restaging CT images with her.  There is a small new lesion in the central mesentery consistent with a lymph node metastasis.  I reviewed remote images and it is unclear whether this lesion was present in the past.  I will present her case at the GI tumor conference this week to review the images.  I recommend continuing monthly Panitumumab.  She will return for an office visit in 1 month.  She plans to follow-up with her primary provider to consider cardiac evaluation of the coronary calcifications seen on the chest CT images.  Betsy Coder, MD  12/30/2018  12:41 PM

## 2019-01-01 ENCOUNTER — Encounter: Payer: Self-pay | Admitting: *Deleted

## 2019-01-01 ENCOUNTER — Telehealth: Payer: Self-pay | Admitting: Oncology

## 2019-01-01 NOTE — Telephone Encounter (Signed)
Scheduled per los. Called and left msg. Mailed printout  °

## 2019-01-08 ENCOUNTER — Other Ambulatory Visit: Payer: Self-pay | Admitting: Oncology

## 2019-01-20 ENCOUNTER — Other Ambulatory Visit: Payer: Self-pay | Admitting: Oncology

## 2019-01-20 DIAGNOSIS — C189 Malignant neoplasm of colon, unspecified: Secondary | ICD-10-CM

## 2019-01-26 ENCOUNTER — Other Ambulatory Visit: Payer: Self-pay | Admitting: Oncology

## 2019-01-27 ENCOUNTER — Inpatient Hospital Stay: Payer: 59

## 2019-01-27 ENCOUNTER — Encounter: Payer: Self-pay | Admitting: Nurse Practitioner

## 2019-01-27 ENCOUNTER — Other Ambulatory Visit: Payer: Self-pay

## 2019-01-27 ENCOUNTER — Inpatient Hospital Stay: Payer: 59 | Attending: Oncology | Admitting: Nurse Practitioner

## 2019-01-27 VITALS — BP 135/87 | HR 77 | Temp 98.0°F | Resp 18 | Ht 64.5 in | Wt 201.9 lb

## 2019-01-27 DIAGNOSIS — Z5112 Encounter for antineoplastic immunotherapy: Secondary | ICD-10-CM | POA: Diagnosis present

## 2019-01-27 DIAGNOSIS — C189 Malignant neoplasm of colon, unspecified: Secondary | ICD-10-CM

## 2019-01-27 DIAGNOSIS — C184 Malignant neoplasm of transverse colon: Secondary | ICD-10-CM | POA: Insufficient documentation

## 2019-01-27 DIAGNOSIS — Z95828 Presence of other vascular implants and grafts: Secondary | ICD-10-CM

## 2019-01-27 LAB — CMP (CANCER CENTER ONLY)
ALT: 21 U/L (ref 0–44)
AST: 18 U/L (ref 15–41)
Albumin: 3.7 g/dL (ref 3.5–5.0)
Alkaline Phosphatase: 119 U/L (ref 38–126)
Anion gap: 7 (ref 5–15)
BUN: 10 mg/dL (ref 6–20)
CO2: 25 mmol/L (ref 22–32)
Calcium: 8.9 mg/dL (ref 8.9–10.3)
Chloride: 109 mmol/L (ref 98–111)
Creatinine: 1.02 mg/dL — ABNORMAL HIGH (ref 0.44–1.00)
GFR, Est AFR Am: >60 mL/min (ref >60)
GFR, Estimated: >60 mL/min (ref >60)
Glucose, Bld: 103 mg/dL — ABNORMAL HIGH (ref 70–99)
Potassium: 4.1 mmol/L (ref 3.5–5.1)
Sodium: 141 mmol/L (ref 135–145)
Total Bilirubin: 0.3 mg/dL (ref 0.3–1.2)
Total Protein: 6.6 g/dL (ref 6.5–8.1)

## 2019-01-27 LAB — CBC WITH DIFFERENTIAL (CANCER CENTER ONLY)
Abs Immature Granulocytes: 0.01 10*3/uL (ref 0.00–0.07)
Basophils Absolute: 0.1 10*3/uL (ref 0.0–0.1)
Basophils Relative: 1 %
Eosinophils Absolute: 0.2 10*3/uL (ref 0.0–0.5)
Eosinophils Relative: 4 %
HCT: 40.7 % (ref 36.0–46.0)
Hemoglobin: 13.2 g/dL (ref 12.0–15.0)
Immature Granulocytes: 0 %
Lymphocytes Relative: 36 %
Lymphs Abs: 2.2 10*3/uL (ref 0.7–4.0)
MCH: 27.4 pg (ref 26.0–34.0)
MCHC: 32.4 g/dL (ref 30.0–36.0)
MCV: 84.6 fL (ref 80.0–100.0)
Monocytes Absolute: 0.5 10*3/uL (ref 0.1–1.0)
Monocytes Relative: 9 %
Neutro Abs: 3 10*3/uL (ref 1.7–7.7)
Neutrophils Relative %: 50 %
Platelet Count: 237 10*3/uL (ref 150–400)
RBC: 4.81 MIL/uL (ref 3.87–5.11)
RDW: 13.2 % (ref 11.5–15.5)
WBC Count: 6.1 10*3/uL (ref 4.0–10.5)
nRBC: 0 % (ref 0.0–0.2)

## 2019-01-27 LAB — MAGNESIUM: Magnesium: 1.8 mg/dL (ref 1.7–2.4)

## 2019-01-27 MED ORDER — SODIUM CHLORIDE 0.9% FLUSH
10.0000 mL | INTRAVENOUS | Status: DC | PRN
  Administered 2019-01-27: 10 mL
  Filled 2019-01-27: qty 10

## 2019-01-27 MED ORDER — SODIUM CHLORIDE 0.9 % IV SOLN
6.0000 mg/kg | Freq: Once | INTRAVENOUS | Status: AC
  Administered 2019-01-27: 600 mg via INTRAVENOUS
  Filled 2019-01-27: qty 10

## 2019-01-27 MED ORDER — HEPARIN SOD (PORK) LOCK FLUSH 100 UNIT/ML IV SOLN
500.0000 [IU] | Freq: Once | INTRAVENOUS | Status: AC | PRN
  Administered 2019-01-27: 500 [IU]
  Filled 2019-01-27: qty 5

## 2019-01-27 MED ORDER — SODIUM CHLORIDE 0.9% FLUSH
10.0000 mL | Freq: Once | INTRAVENOUS | Status: AC
  Administered 2019-01-27: 10 mL
  Filled 2019-01-27: qty 10

## 2019-01-27 MED ORDER — SODIUM CHLORIDE 0.9 % IV SOLN
Freq: Once | INTRAVENOUS | Status: AC
  Filled 2019-01-27: qty 250

## 2019-01-27 NOTE — Progress Notes (Signed)
[place] OFFICE PROGRESS NOTE   Diagnosis: Colon cancer  INTERVAL HISTORY:   [name] returns as scheduled.  She continues monthly Panitumumab.  She denies nausea/vomiting.  No mouth sores.  No diarrhea.  Skin rashes stable.  No abdominal pain.  Objective:  Vital signs in last 24 hours:  Blood pressure 135/87, pulse 77, temperature 98 F (36.7 C), temperature source Temporal, resp. rate 18, height 5' 4.5" (1.638 m), weight 201 lb 14.4 oz (91.6 kg), SpO2 100 %.    HEENT: No thrush or ulcers. GI: Abdomen soft and nontender.  No hepatomegaly.  No mass. Vascular: No leg edema. Skin: No significant skin rash. Port-A-Cath without erythema.  Lab Results:  Lab Results  Component Value Date   WBC 6.1 01/27/2019   HGB 13.2 01/27/2019   HCT 40.7 01/27/2019   MCV 84.6 01/27/2019   PLT 237 01/27/2019   NEUTROABS 3.0 01/27/2019    Imaging:  No results found.  Medications: I have reviewed the patient's current medications.  Assessment/Plan: 1. 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  Cycle 2 CAPOX 01/22/2015  Cycle 3 CAPOX 02/12/2015  Cycle 4 CAPOX 03/05/2015  Cycle 5 CAPOX 03/26/2015  Cycle 6 CAPOX 04/16/2015  Cycle 7 CAPOX 05/07/2015 (oxaliplatin held due to progressive neuropathy)  Cycle 8 CAPOX 05/28/2015 (oxaliplatin held secondary to neuropathy)  CTs 11/01/2015-no significant change and an 11 mm upper abdomen mesenteric lymph node with partial calcification-treated metastasis?, No other evidence of metastatic disease  CTs 01/21/2016-mild interval progression of previously identified calcified nodal tissue in the upper central abdomen.  Development of small adjacent lymph nodes in the interval.  EUS guided biopsy of the calcified upper abdominal mass on 02/17/2016 confirmed metastatic adenocarcinoma  Cycle 1 FOLFIRI/Avastin 03/09/2016  Cycle 2 FOLFIRI/Avastin 03/23/2016  Cycle 3 FOLFIRI/Avastin 04/06/2016 (Neulasta added)  Cycle 4 FOLFIRI/Avastin 04/20/2016  Cycle 5 FOLFIRI/Avastin 05/04/2016  Cycle 6 FOLFIRI/Avastin 05/18/2016  CT abdomen/pelvis 05/30/2016-new retroperitoneal lymphadenopathy in the para-aortic region with the largest node measuring 1.5 cm. Partially calcified mesenteric lymph node measures 12 mm as compared to 14 mm previously. Other scattered less than 1 cmmesenteric lymph nodes remain stable.  PET scan 06/13/2016-hypermetabolic posterior mediastinal node, hypermetabolic retroperitoneal lymph nodes, stable transverse mesocolon mass, bilateral hypermetabolic inguinal nodes  EUS biopsy of posterior mediastinal lymph nodes on 07/20/2016-positive for metastatic adenocarcinoma  Clinical trial at [facility], [id number] Panitumumab, nivolumab, ipilimumab.  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 No change in small calcified mesenteric and retroperitoneal abdominal lymph nodes. No evidence of metastatic disease in the chest.  Maintenance panitumumab beginning 03/29/2017, healed 05/24/2017 secondary to elevated liver enzymes-resumed 06/07/2017  Restaging CTs at [facility] 06/04/2017-no evidence of disease progression, unchanged scattered mesenteric/retroperitoneal lymph nodes  Panitumumab every 2 weeks  Restaging CTs 08/27/2017-no evidence of metastatic disease in the chest;unchanged size and  appearance of scattered calcified and noncalcified mesenteric and retroperitoneal lymph nodes. Near complete resolution of previously described haziness within the central mesentery. No new evidence of recurrent or metastatic disease within the abdomen or  pelvis.  Continue Panitumumab every 2 weeks  Restaging CTs 01/01/2018-stable lymph nodes within the posterior mediastinum, retroperitoneum and transverse mesocolon  Continue Panitumumab every 2 weeks  Restaging CT 07/08/2018-no evidence of metastatic disease, we noted small retroperitoneal/mesenteric nodes are calcified  Every 2-week panitumumab continued  Panitumumab changed to monthly dosing beginning 08/08/2018  CTs 12/24/2018-new 1.5 cm lesion in the central small bowel mesentery adjacent inferior to the body of the pancreas, suspicious for metastatic lymph node, no other evidence of metastatic disease, indeterminate 7 mm upper pole right kidney lesion  Panitumumab monthly dosing continued 2. History of Microcytic anemia-likely iron deficiency anemia secondary to #1 and ongoing menses 3. Status post cholecystectomy 4. Family history of breast and ovarian cancer. She has seen the genetics counselor. 5. Delayed nausea following cycle 1 CAPOX. Aloxi and Emend added beginning with cycle 2. 6. Transient episodes of dyspnea following cycle 4 and cycle 5 CAPOX, likely neurotoxicity from oxaliplatin 7. Oxaliplatin neuropathy-improved 8. Mild neutropenia secondary to chemotherapy-Neulasta added beginning with cycle 3 FOLFIRI/Avastin 9. Mild mucositis following cycle 4 FOLFIRI/Avastin 10. Mammogram 05/11/2016-possible mass right breast. Right breast ultrasound 05/17/2016-right breast mass at 10:00 most likely represents a cyst, however is somewhat hypoechoic and could also represent a fibroadenoma. Ultrasound-guided aspiration of a 1 cmbenign cyst 05/22/2016. 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 12. Hypomagnesia secondary to Panitumumab. She continues magnesium oxide. 13. Elevated liver enzymes May 2019-likely secondary to resuming atorvastatin, now normal 14. History of paronychia related to Panitumumab.  Disposition: Brenda Becker appears stable.  The plan is to continue monthly Panitumumab and repeat CTs at a 20-monthinterval.  We reviewed the CBC and chemistry panel from today.  Labs adequate to proceed with treatment as above.  She will return for lab, follow-up, Panitumumab in 4 weeks.  She will contact the office in the interim with any problems.    LNed CardANP/GNP-BC   01/27/2019  2:04 PM

## 2019-01-27 NOTE — Patient Instructions (Signed)
[facility] Discharge Instructions for Patients Receiving Chemotherapy  Today you received the following chemotherapy agents: panitumumab.  To help prevent nausea and vomiting after your treatment, we encourage you to take your nausea medication as directed.   If you develop nausea and vomiting that is not controlled by your nausea medication, call the clinic.   BELOW ARE SYMPTOMS THAT SHOULD BE REPORTED IMMEDIATELY:  *FEVER GREATER THAN 100.5 F  *CHILLS WITH OR WITHOUT FEVER  NAUSEA AND VOMITING THAT IS NOT CONTROLLED WITH YOUR NAUSEA MEDICATION  *UNUSUAL SHORTNESS OF BREATH  *UNUSUAL BRUISING OR BLEEDING  TENDERNESS IN MOUTH AND THROAT WITH OR WITHOUT PRESENCE OF ULCERS  *URINARY PROBLEMS  *BOWEL PROBLEMS  UNUSUAL RASH Items with * indicate a potential emergency and should be followed up as soon as possible.  Feel free to call the clinic should you have any questions or concerns. The clinic phone number is [phone].  Please show the CHEMO ALERT CARD at check-in to the Emergency Department and triage nurse.   

## 2019-02-23 ENCOUNTER — Other Ambulatory Visit: Payer: Self-pay | Admitting: Oncology

## 2019-02-25 ENCOUNTER — Inpatient Hospital Stay: Payer: 59

## 2019-02-25 ENCOUNTER — Inpatient Hospital Stay: Payer: 59 | Attending: Oncology | Admitting: Oncology

## 2019-02-25 ENCOUNTER — Other Ambulatory Visit: Payer: Self-pay

## 2019-02-25 VITALS — BP 124/91 | HR 94 | Temp 98.0°F | Resp 20 | Ht 64.5 in | Wt 205.0 lb

## 2019-02-25 DIAGNOSIS — C184 Malignant neoplasm of transverse colon: Secondary | ICD-10-CM

## 2019-02-25 DIAGNOSIS — C189 Malignant neoplasm of colon, unspecified: Secondary | ICD-10-CM

## 2019-02-25 DIAGNOSIS — Z5112 Encounter for antineoplastic immunotherapy: Secondary | ICD-10-CM | POA: Diagnosis not present

## 2019-02-25 DIAGNOSIS — Z95828 Presence of other vascular implants and grafts: Secondary | ICD-10-CM

## 2019-02-25 LAB — CMP (CANCER CENTER ONLY)
ALT: 14 U/L (ref 0–44)
AST: 13 U/L — ABNORMAL LOW (ref 15–41)
Albumin: 3.4 g/dL — ABNORMAL LOW (ref 3.5–5.0)
Alkaline Phosphatase: 126 U/L (ref 38–126)
Anion gap: 8 (ref 5–15)
BUN: 12 mg/dL (ref 6–20)
CO2: 26 mmol/L (ref 22–32)
Calcium: 8.7 mg/dL — ABNORMAL LOW (ref 8.9–10.3)
Chloride: 107 mmol/L (ref 98–111)
Creatinine: 1 mg/dL (ref 0.44–1.00)
GFR, Est AFR Am: >60 mL/min (ref >60)
GFR, Estimated: >60 mL/min (ref >60)
Glucose, Bld: 118 mg/dL — ABNORMAL HIGH (ref 70–99)
Potassium: 3.7 mmol/L (ref 3.5–5.1)
Sodium: 141 mmol/L (ref 135–145)
Total Bilirubin: 0.3 mg/dL (ref 0.3–1.2)
Total Protein: 6.6 g/dL (ref 6.5–8.1)

## 2019-02-25 LAB — CBC WITH DIFFERENTIAL (CANCER CENTER ONLY)
Abs Immature Granulocytes: 0.01 10*3/uL (ref 0.00–0.07)
Basophils Absolute: 0.1 10*3/uL (ref 0.0–0.1)
Basophils Relative: 1 %
Eosinophils Absolute: 0.3 10*3/uL (ref 0.0–0.5)
Eosinophils Relative: 4 %
HCT: 39.4 % (ref 36.0–46.0)
Hemoglobin: 12.8 g/dL (ref 12.0–15.0)
Immature Granulocytes: 0 %
Lymphocytes Relative: 34 %
Lymphs Abs: 2.3 10*3/uL (ref 0.7–4.0)
MCH: 27.5 pg (ref 26.0–34.0)
MCHC: 32.5 g/dL (ref 30.0–36.0)
MCV: 84.5 fL (ref 80.0–100.0)
Monocytes Absolute: 0.5 10*3/uL (ref 0.1–1.0)
Monocytes Relative: 7 %
Neutro Abs: 3.7 10*3/uL (ref 1.7–7.7)
Neutrophils Relative %: 54 %
Platelet Count: 250 10*3/uL (ref 150–400)
RBC: 4.66 MIL/uL (ref 3.87–5.11)
RDW: 13.3 % (ref 11.5–15.5)
WBC Count: 6.9 10*3/uL (ref 4.0–10.5)
nRBC: 0 % (ref 0.0–0.2)

## 2019-02-25 LAB — MAGNESIUM: Magnesium: 1.7 mg/dL (ref 1.7–2.4)

## 2019-02-25 MED ORDER — SODIUM CHLORIDE 0.9 % IV SOLN
6.0000 mg/kg | Freq: Once | INTRAVENOUS | Status: AC
  Administered 2019-02-25: 12:00:00 600 mg via INTRAVENOUS
  Filled 2019-02-25: qty 30

## 2019-02-25 MED ORDER — SODIUM CHLORIDE 0.9% FLUSH
10.0000 mL | Freq: Once | INTRAVENOUS | Status: AC
  Administered 2019-02-25: 10 mL
  Filled 2019-02-25: qty 10

## 2019-02-25 MED ORDER — SODIUM CHLORIDE 0.9 % IV SOLN
Freq: Once | INTRAVENOUS | Status: AC
  Filled 2019-02-25: qty 250

## 2019-02-25 MED ORDER — HEPARIN SOD (PORK) LOCK FLUSH 100 UNIT/ML IV SOLN
500.0000 [IU] | Freq: Once | INTRAVENOUS | Status: AC | PRN
  Administered 2019-02-25: 13:00:00 500 [IU] via INTRAVENOUS
  Filled 2019-02-25: qty 5

## 2019-02-25 NOTE — Progress Notes (Signed)
[place] OFFICE PROGRESS NOTE   Diagnosis: Colon cancer  INTERVAL HISTORY:   Brenda Becker returns as scheduled.  She last received Panitumumab on 01/27/2019.  She has intermittent diarrhea relieved with Imodium.  The skin rash has progressed.  This chiefly affects the face.  She has discomfort at the base of the left thumb and right shoulder.  The shoulder pain has improved.  Objective:  Vital signs in last 24 hours:  Blood pressure (!) 124/91, pulse 94, temperature 98 F (36.7 C), temperature source Temporal, resp. rate 20, height 5' 4.5" (1.638 m), weight 205 lb (93 kg), SpO2 100 %.     GI: No hepatomegaly, no mass, nontender Vascular: No leg edema Musculoskeletal: No pain with motion of the right shoulder, mild tenderness at the medial base of the left thumb Skin: Erythematous pustular rash over the face and chest  Portacath/PICC-without erythema  Lab Results:  Lab Results  Component Value Date   WBC 6.9 02/25/2019   HGB 12.8 02/25/2019   HCT 39.4 02/25/2019   MCV 84.5 02/25/2019   PLT 250 02/25/2019   NEUTROABS 3.7 02/25/2019    CMP  Lab Results  Component Value Date   NA 141 02/25/2019   K 3.7 02/25/2019   CL 107 02/25/2019   CO2 26 02/25/2019   GLUCOSE 118 (H) 02/25/2019   BUN 12 02/25/2019   CREATININE 1.00 02/25/2019   CALCIUM 8.7 (L) 02/25/2019   PROT 6.6 02/25/2019   ALBUMIN 3.4 (L) 02/25/2019   AST 13 (L) 02/25/2019   ALT 14 02/25/2019   ALKPHOS 126 02/25/2019   BILITOT 0.3 02/25/2019   GFRNONAA >60 02/25/2019   GFRAA >60 02/25/2019    Lab Results  Component Value Date   CEA1 <1.00 11/22/2017     Medications: I have reviewed the patient's current medications.   Assessment/Plan: 1. 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  Cycle 2 CAPOX 01/22/2015  Cycle 3 CAPOX 02/12/2015  Cycle 4 CAPOX 03/05/2015  Cycle 5 CAPOX 03/26/2015  Cycle 6 CAPOX 04/16/2015  Cycle 7 CAPOX 05/07/2015 (oxaliplatin held due to progressive neuropathy)  Cycle 8 CAPOX 05/28/2015 (oxaliplatin held secondary to neuropathy)  CTs 11/01/2015-no significant change and an 11 mm upper abdomen mesenteric lymph node with partial calcification-treated metastasis?, No other evidence of metastatic disease  CTs 01/21/2016-mild interval progression of previously identified calcified nodal tissue in the upper central abdomen. Development of small adjacent lymph nodes in the interval.  EUS guided biopsy of the calcified upper abdominal mass on 02/17/2016 confirmed metastatic adenocarcinoma  Cycle 1 FOLFIRI/Avastin 03/09/2016  Cycle 2 FOLFIRI/Avastin 03/23/2016  Cycle 3 FOLFIRI/Avastin 04/06/2016 (Neulasta added)  Cycle 4 FOLFIRI/Avastin 04/20/2016  Cycle 5 FOLFIRI/Avastin 05/04/2016  Cycle 6 FOLFIRI/Avastin 05/18/2016  CT abdomen/pelvis 05/30/2016-new retroperitoneal lymphadenopathy in the para-aortic region with the largest node measuring 1.5 cm. Partially calcified mesenteric lymph node measures 12 mm as compared to 14 mm previously. Other scattered less than 1 cmmesenteric lymph nodes remain stable.  PET scan 06/13/2016-hypermetabolic posterior mediastinal node, hypermetabolic retroperitoneal lymph nodes, stable transverse mesocolon mass, bilateral hypermetabolic inguinal nodes  EUS biopsy of posterior mediastinal lymph nodes on 07/20/2016-positive for metastatic adenocarcinoma  Clinical trial at [facility], [id number] Panitumumab, nivolumab, ipilimumab.  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 No change in small calcified mesenteric and retroperitoneal abdominal lymph nodes. No evidence of metastatic disease in the chest.  Maintenance panitumumab beginning 03/29/2017, healed 05/24/2017 secondary to elevated liver enzymes-resumed 06/07/2017  [facility]aging CTs at [facility] 06/04/2017-no evidence of disease progression, unchanged scattered mesenteric/retroperitoneal lymph nodes  Panitumumab every 2 weeks  Restaging CTs 08/27/2017-no evidence of metastatic disease in the chest;unchanged size and appearance of scattered calcified and noncalcified mesenteric and retroperitoneal lymph nodes. Near complete resolution of previously described haziness within the central mesentery. No new evidence of recurrent or metastatic disease within the abdomen or pelvis.  Continue Panitumumab every 2 weeks  Restaging CTs 01/01/2018-stable lymph nodes within the posterior mediastinum, retroperitoneum and transverse mesocolon  Continue Panitumumab every 2 weeks  Restaging CT 07/08/2018-no evidence of metastatic disease, we noted small retroperitoneal/mesenteric nodes are calcified  Every 2-week panitumumab continued  Panitumumab changed to monthly dosing beginning 08/08/2018  CTs 12/24/2018-new 1.5 cm lesion in the central small bowel mesentery adjacent inferior to the body of the pancreas, suspicious for metastatic lymph node, no other evidence of metastatic disease, indeterminate 7 mm upper pole right kidney lesion  Panitumumab monthly dosing continued 2. History of Microcytic anemia-likely iron deficiency anemia secondary to #1 and ongoing menses 3. Status post cholecystectomy 4. Family history of breast and ovarian cancer. She has seen the genetics counselor. 5. Delayed nausea following cycle 1  CAPOX. Aloxi and Emend added beginning with cycle 2. 6. Transient episodes of dyspnea following cycle 4 and cycle 5 CAPOX, likely neurotoxicity from oxaliplatin 7. Oxaliplatin neuropathy-improved 8. Mild neutropenia secondary to chemotherapy-Neulasta added beginning with cycle 3 FOLFIRI/Avastin 9. Mild mucositis following cycle 4 FOLFIRI/Avastin 10. Mammogram 05/11/2016-possible mass right breast. Right breast ultrasound 05/17/2016-right breast mass at 10:00 most likely represents a cyst, however is somewhat hypoechoic and could also represent a fibroadenoma. Ultrasound-guided aspiration of a 1 cmbenign cyst 05/22/2016. 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 12. Hypomagnesia secondary to Panitumumab. She continues magnesium oxide. 13. Elevated liver enzymes May 2019-likely secondary to resuming atorvastatin, now normal 14. History of paronychia related to Panitumumab.    Disposition: Brenda Becker appears stable aside from mild progression of the skin rash.  She will complete another treatment with Panitumumab today.  She will undergo a restaging CT evaluation prior to an office visit next month.  I suspect the shoulder and thumb discomfort are related to a benign musculoskeletal condition.  Betsy Coder, MD  02/25/2019  1:57 PM

## 2019-02-25 NOTE — Patient Instructions (Signed)
[facility] Discharge Instructions for Patients Receiving Chemotherapy  Today you received the following chemotherapy agents: Vectibix.  To help prevent nausea and vomiting after your treatment, we encourage you to take your nausea medication as directed.   If you develop nausea and vomiting that is not controlled by your nausea medication, call the clinic.   BELOW ARE SYMPTOMS THAT SHOULD BE REPORTED IMMEDIATELY:  *FEVER GREATER THAN 100.5 F  *CHILLS WITH OR WITHOUT FEVER  NAUSEA AND VOMITING THAT IS NOT CONTROLLED WITH YOUR NAUSEA MEDICATION  *UNUSUAL SHORTNESS OF BREATH  *UNUSUAL BRUISING OR BLEEDING  TENDERNESS IN MOUTH AND THROAT WITH OR WITHOUT PRESENCE OF ULCERS  *URINARY PROBLEMS  *BOWEL PROBLEMS  UNUSUAL RASH Items with * indicate a potential emergency and should be followed up as soon as possible.  Feel free to call the clinic should you have any questions or concerns. The clinic phone number is [phone].  Please show the CHEMO ALERT CARD at check-in to the Emergency Department and triage nurse.   

## 2019-02-25 NOTE — Patient Instructions (Signed)

## 2019-02-26 ENCOUNTER — Telehealth: Payer: Self-pay | Admitting: Oncology

## 2019-02-26 MED ORDER — ACETAMINOPHEN 325 MG PO TABS
ORAL_TABLET | ORAL | Status: AC
  Filled 2019-02-26: qty 2

## 2019-02-26 MED ORDER — DIPHENHYDRAMINE HCL 25 MG PO CAPS
ORAL_CAPSULE | ORAL | Status: AC
  Filled 2019-02-26: qty 2

## 2019-02-26 MED ORDER — FAMOTIDINE IN NACL 20-0.9 MG/50ML-% IV SOLN
INTRAVENOUS | Status: AC
  Filled 2019-02-26: qty 50

## 2019-02-26 MED ORDER — METHYLPREDNISOLONE SODIUM SUCC 125 MG IJ SOLR
INTRAMUSCULAR | Status: AC
  Filled 2019-02-26: qty 2

## 2019-02-26 NOTE — Telephone Encounter (Signed)
Scheduled per los. Called and spoke with patient. Confirmed appt 

## 2019-03-23 ENCOUNTER — Other Ambulatory Visit: Payer: Self-pay | Admitting: Oncology

## 2019-03-24 ENCOUNTER — Ambulatory Visit (HOSPITAL_COMMUNITY)
Admission: RE | Admit: 2019-03-24 | Discharge: 2019-03-24 | Disposition: A | Discharge status: Home / Self Care | Payer: 59 | Source: Ambulatory Visit | Attending: Oncology | Admitting: Oncology

## 2019-03-24 ENCOUNTER — Other Ambulatory Visit: Payer: Self-pay

## 2019-03-24 ENCOUNTER — Encounter: Payer: Self-pay | Admitting: Family Medicine

## 2019-03-24 DIAGNOSIS — C184 Malignant neoplasm of transverse colon: Secondary | ICD-10-CM | POA: Insufficient documentation

## 2019-03-24 MED ORDER — SODIUM CHLORIDE (PF) 0.9 % IJ SOLN
INTRAMUSCULAR | Status: AC
  Filled 2019-03-24: qty 50

## 2019-03-24 MED ORDER — IOHEXOL 9 MG/ML PO SOLN
1000.0000 mL | ORAL | Status: AC
  Administered 2019-03-24: 1000 mL via ORAL

## 2019-03-24 MED ORDER — IOHEXOL 300 MG/ML  SOLN
100.0000 mL | Freq: Once | INTRAMUSCULAR | Status: AC | PRN
  Administered 2019-03-24: 09:00:00 100 mL via INTRAVENOUS

## 2019-03-24 MED ORDER — IOHEXOL 9 MG/ML PO SOLN
ORAL | Status: AC
  Filled 2019-03-24: qty 1000

## 2019-03-25 ENCOUNTER — Inpatient Hospital Stay: Payer: 59

## 2019-03-25 ENCOUNTER — Encounter: Payer: Self-pay | Admitting: Nurse Practitioner

## 2019-03-25 ENCOUNTER — Other Ambulatory Visit: Payer: Self-pay

## 2019-03-25 ENCOUNTER — Inpatient Hospital Stay: Payer: 59 | Attending: Oncology | Admitting: Nurse Practitioner

## 2019-03-25 VITALS — BP 138/90 | HR 89 | Temp 97.8°F | Resp 18 | Ht 64.5 in | Wt 203.9 lb

## 2019-03-25 DIAGNOSIS — Z5112 Encounter for antineoplastic immunotherapy: Secondary | ICD-10-CM | POA: Diagnosis not present

## 2019-03-25 DIAGNOSIS — Z95828 Presence of other vascular implants and grafts: Secondary | ICD-10-CM

## 2019-03-25 DIAGNOSIS — C184 Malignant neoplasm of transverse colon: Secondary | ICD-10-CM

## 2019-03-25 LAB — CMP (CANCER CENTER ONLY)
ALT: 15 U/L (ref 0–44)
AST: 14 U/L — ABNORMAL LOW (ref 15–41)
Albumin: 3.4 g/dL — ABNORMAL LOW (ref 3.5–5.0)
Alkaline Phosphatase: 146 U/L — ABNORMAL HIGH (ref 38–126)
Anion gap: 7 (ref 5–15)
BUN: 7 mg/dL (ref 6–20)
CO2: 26 mmol/L (ref 22–32)
Calcium: 8.8 mg/dL — ABNORMAL LOW (ref 8.9–10.3)
Chloride: 108 mmol/L (ref 98–111)
Creatinine: 1.03 mg/dL — ABNORMAL HIGH (ref 0.44–1.00)
GFR, Est AFR Am: >60 mL/min (ref >60)
GFR, Estimated: >60 mL/min (ref >60)
Glucose, Bld: 101 mg/dL — ABNORMAL HIGH (ref 70–99)
Potassium: 3.6 mmol/L (ref 3.5–5.1)
Sodium: 141 mmol/L (ref 135–145)
Total Bilirubin: 0.3 mg/dL (ref 0.3–1.2)
Total Protein: 6.5 g/dL (ref 6.5–8.1)

## 2019-03-25 LAB — MAGNESIUM: Magnesium: 1.6 mg/dL — ABNORMAL LOW (ref 1.7–2.4)

## 2019-03-25 MED ORDER — SODIUM CHLORIDE 0.9 % IV SOLN
6.0000 mg/kg | Freq: Once | INTRAVENOUS | Status: AC
  Administered 2019-03-25: 12:00:00 600 mg via INTRAVENOUS
  Filled 2019-03-25: qty 20

## 2019-03-25 MED ORDER — LOPERAMIDE HCL 2 MG PO CAPS: 2.0000 mg | ORAL_CAPSULE | ORAL | 0 refills | Status: DC | PRN

## 2019-03-25 MED ORDER — HEPARIN SOD (PORK) LOCK FLUSH 100 UNIT/ML IV SOLN
500.0000 [IU] | Freq: Once | INTRAVENOUS | Status: DC
  Filled 2019-03-25: qty 5

## 2019-03-25 MED ORDER — SODIUM CHLORIDE 0.9% FLUSH
10.0000 mL | INTRAVENOUS | Status: DC | PRN
  Administered 2019-03-25: 10 mL
  Filled 2019-03-25: qty 10

## 2019-03-25 MED ORDER — HEPARIN SOD (PORK) LOCK FLUSH 100 UNIT/ML IV SOLN
500.0000 [IU] | Freq: Once | INTRAVENOUS | Status: AC | PRN
  Administered 2019-03-25: 500 [IU]
  Filled 2019-03-25: qty 5

## 2019-03-25 MED ORDER — SODIUM CHLORIDE 0.9 % IV SOLN
Freq: Once | INTRAVENOUS | Status: AC
  Filled 2019-03-25: qty 250

## 2019-03-25 MED ORDER — SODIUM CHLORIDE 0.9% FLUSH
10.0000 mL | Freq: Once | INTRAVENOUS | Status: AC
  Administered 2019-03-25: 10 mL
  Filled 2019-03-25: qty 10

## 2019-03-25 NOTE — Patient Instructions (Signed)
[facility] Discharge Instructions for Patients Receiving Chemotherapy  Today you received the following chemotherapy agents: Vectibix.  To help prevent nausea and vomiting after your treatment, we encourage you to take your nausea medication as directed.   If you develop nausea and vomiting that is not controlled by your nausea medication, call the clinic.   BELOW ARE SYMPTOMS THAT SHOULD BE REPORTED IMMEDIATELY:  *FEVER GREATER THAN 100.5 F  *CHILLS WITH OR WITHOUT FEVER  NAUSEA AND VOMITING THAT IS NOT CONTROLLED WITH YOUR NAUSEA MEDICATION  *UNUSUAL SHORTNESS OF BREATH  *UNUSUAL BRUISING OR BLEEDING  TENDERNESS IN MOUTH AND THROAT WITH OR WITHOUT PRESENCE OF ULCERS  *URINARY PROBLEMS  *BOWEL PROBLEMS  UNUSUAL RASH Items with * indicate a potential emergency and should be followed up as soon as possible.  Feel free to call the clinic should you have any questions or concerns. The clinic phone number is [phone].  Please show the CHEMO ALERT CARD at check-in to the Emergency Department and triage nurse.   

## 2019-03-25 NOTE — Progress Notes (Addendum)
[facility] OFFICE PROGRESS NOTE   Diagnosis: Colon cancer  INTERVAL HISTORY:   Brenda Becker returns as scheduled.  She completed another cycle of Panitumumab 02/25/2019.  She denies nausea/vomiting.  Diarrhea overall is controlled with Imodium.  Skin rash is stable.  Objective:  Vital signs in last 24 hours:  Blood pressure 138/90, pulse 89, temperature 97.8 F (36.6 C), temperature source Temporal, resp. rate 18, height 5' 4.5" (1.638 m), weight 203 lb 14.4 oz (92.5 kg), SpO2 100 %.    HEENT: No thrush or ulcers. GI: Abdomen soft and nontender.  No hepatomegaly. Vascular: No leg edema. Neuro: Alert and oriented. Skin: Erythematous pustular rash over the face and chest. Port-A-Cath without erythema.   Lab Results:  Lab Results  Component Value Date   WBC 6.9 02/25/2019   HGB 12.8 02/25/2019   HCT 39.4 02/25/2019   MCV 84.5 02/25/2019   PLT 250 02/25/2019   NEUTROABS 3.7 02/25/2019    Imaging:  CT ABDOMEN PELVIS W CONTRAST  Result Date: 03/24/2019 CLINICAL DATA:  Colorectal carcinoma. Stage IIIC well differentiated adenocarcinoma transverse colon status post partial colectomy. EXAM: CT ABDOMEN AND PELVIS WITH CONTRAST TECHNIQUE: Multidetector CT imaging of the abdomen and pelvis was performed using the standard protocol following bolus administration of intravenous contrast. CONTRAST:  173m OMNIPAQUE IOHEXOL 300 MG/ML  SOLN COMPARISON:  CT 09/24/2018 FINDINGS: Lower chest: Lung bases are clear. Hepatobiliary: No focal hepatic lesion. Postcholecystectomy. No biliary dilatation. Pancreas: Pancreas is normal. Spleen: Normal spleen Adrenals/urinary tract: Adrenal glands and kidneys are normal. There small bilateral nonobstructing renal calculi. The ureters and bladder normal. Stomach/Bowel: Stomach, small-bowel and cecum normal. No evidence of colon obstruction. No nodularity. Vascular/Lymphatic: Abdominal aorta normal caliber. The central mesenteric lymph node  adjacent to the pancreas measures 13 mm (image 23/2 compared to 12 mm on prior for no appreciable change. No additional mesenteric adenopathy identified. No peritoneal or omental nodularity. Reproductive: Uterus normal. Cystic lesions posterior to the uterus are favored benign cysts of the ovaries. Other: No free-fluid.  No omental peritoneal metastasis. Musculoskeletal: No aggressive osseous lesion IMPRESSION: 1. Stable lymph node in the central mesentery adjacent to the pancreas. 2. No evidence of new mesenteric lymphadenopathy. 3. Benign-appearing cysts posterior to the uterus are consistent with ovarian cysts. Electronically Signed   By: SSuzy BouchardM.D.   On: 03/24/2019 10:35    Medications: I have reviewed the patient's current medications.  Assessment/Plan: 1. 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  Cycle 2 CAPOX 01/22/2015  Cycle 3 CAPOX 02/12/2015  Cycle 4 CAPOX 03/05/2015  Cycle 5 CAPOX 03/26/2015  Cycle 6 CAPOX 04/16/2015  Cycle 7 CAPOX 05/07/2015 (oxaliplatin held due to progressive neuropathy)  Cycle 8 CAPOX 05/28/2015 (oxaliplatin held secondary to neuropathy)  CTs 11/01/2015-no significant change and an 11 mm upper abdomen mesenteric lymph node with partial calcification-treated metastasis?, No other evidence of metastatic disease  CTs 01/21/2016-mild interval progression of previously identified calcified nodal tissue in the upper central abdomen. Development of small adjacent lymph nodes in the interval.  EUS guided biopsy of the calcified upper abdominal mass on 02/17/2016 confirmed metastatic adenocarcinoma  Cycle 1 FOLFIRI/Avastin 03/09/2016  Cycle 2  FOLFIRI/Avastin 03/23/2016  Cycle 3 FOLFIRI/Avastin 04/06/2016 (Neulasta added)  Cycle 4 FOLFIRI/Avastin 04/20/2016  Cycle  5 FOLFIRI/Avastin 05/04/2016  Cycle 6 FOLFIRI/Avastin 05/18/2016  CT abdomen/pelvis 05/30/2016-new retroperitoneal lymphadenopathy in the para-aortic region with the largest node measuring 1.5 cm. Partially calcified mesenteric lymph node measures 12 mm as compared to 14 mm previously. Other scattered less than 1 cmmesenteric lymph nodes remain stable.  PET scan 06/13/2016-hypermetabolic posterior mediastinal node, hypermetabolic retroperitoneal lymph nodes, stable transverse mesocolon mass, bilateral hypermetabolic inguinal nodes  EUS biopsy of posterior mediastinal lymph nodes on 07/20/2016-positive for metastatic adenocarcinoma  Clinical trial at [facility], [id number] Panitumumab, nivolumab, ipilimumab.  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 No change in small calcified mesenteric and retroperitoneal abdominal lymph nodes. No evidence of metastatic disease in the chest.  Maintenance panitumumab beginning 03/29/2017, healed 05/24/2017 secondary to elevated liver enzymes-resumed 06/07/2017  [facility] at [facility] 06/04/2017-no evidence of disease progression, unchanged scattered mesenteric/retroperitoneal lymph nodes  Panitumumab every 2 weeks  Restaging CTs 08/27/2017-no evidence of metastatic disease in the chest;unchanged size and appearance of scattered calcified and noncalcified mesenteric and retroperitoneal lymph nodes. Near complete resolution of previously described haziness within the central mesentery. No new evidence of recurrent or  metastatic disease within the abdomen or pelvis.  Continue Panitumumab every 2 weeks  Restaging CTs 01/01/2018-stable lymph nodes within the posterior mediastinum, retroperitoneum and transverse mesocolon  Continue Panitumumab every 2 weeks  Restaging CT 07/08/2018-no evidence of metastatic disease, we noted small retroperitoneal/mesenteric nodes are calcified  Every 2-week panitumumab continued  Panitumumab changed to monthly dosing beginning 08/08/2018  CTs 12/24/2018-new 1.5 cm lesion in the central small bowel mesentery adjacent inferior to the body of the pancreas, suspicious for metastatic lymph node, no other evidence of metastatic disease, indeterminate 7 mm upper pole right kidney lesion  Panitumumab monthly dosing continued  Stable lymph node in the central mesentery adjacent to the pancreas.  No new mesenteric lymphadenopathy.  Panitumumab monthly dosing continued 2. History of Microcytic anemia-likely iron deficiency anemia secondary to #1 and ongoing menses 3. Status post cholecystectomy 4. Family history of breast and ovarian cancer. She has seen the genetics counselor. 5. Delayed nausea following cycle 1 CAPOX. Aloxi and Emend added beginning with cycle 2. 6. Transient episodes of dyspnea following cycle 4 and cycle 5 CAPOX, likely neurotoxicity from oxaliplatin 7. Oxaliplatin neuropathy-improved 8. Mild neutropenia secondary to chemotherapy-Neulasta added beginning with cycle 3 FOLFIRI/Avastin 9. Mild mucositis following cycle 4 FOLFIRI/Avastin 10. Mammogram 05/11/2016-possible mass right breast. Right breast ultrasound 05/17/2016-right breast mass at 10:00 most likely represents a cyst, however is somewhat hypoechoic and could also represent a fibroadenoma. Ultrasound-guided aspiration of a 1 cmbenign cyst 05/22/2016. 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 12. Hypomagnesia secondary to Panitumumab. She continues magnesium oxide. 13.  Elevated liver enzymes May 2019-likely secondary to resuming atorvastatin, now normal 14. History of paronychia related to Panitumumab.  Disposition: Brenda Becker appears stable.  The restaging CT scan from yesterday showed a stable lymph node in the central mesentery.  We reviewed the results with her at today's visit.  Dr. [name] recommends continuation of Panitumumab on a monthly dosing schedule.  Brenda Becker is interested in adding nivolumab to the treatment regimen.  Dr. [name] does not recommend this at present.  Plan to proceed with Panitumumab alone today as scheduled.  We will  make a referral to Dr. Aleatha Borer at [facility] to discuss this further.  Brenda Becker agrees with this plan.  We reviewed the labs from today, adequate for treatment.  She has mild hypomagnesemia.  She will continue oral magnesium as currently taking.  She will return for lab, follow-up, Panitumumab/possible nivolumab in 1 month.  She will contact the office in the interim with any problems.  Patient seen with Dr. [name].    [name]   03/25/2019  10:45 AM This was a shared visit with Ned Card.  We reviewed the restaging CT findings with Brenda Becker.  I reviewed the images.  There is no clinical or radiologic evidence of disease progression.  I recommend continuing Panitumumab.  She is concerned that the mesenteric mass has not resolved and would like to consider adding nivolumab to the systemic therapy regimen.  She understands a history of hepatitis while she was being treated with Panitumumab, nivolumab, and ipilimumab.  It is unclear which agent contributed to the liver toxicity.  She also has a history of elevated liver enzymes related to a cholesterol medication.  She would like to discuss options with Dr. Aleatha Borer.  We will try to arrange for a virtual visit.  We discussed the incurable nature of the cancer.  She will return for an office visit and Panitumumab +/-nivolumab in 1 month.  Julieanne Manson, MD

## 2019-03-25 NOTE — Progress Notes (Signed)
Ok to treat with Magnesium today of 1.6 per Ned Card, NP

## 2019-03-26 ENCOUNTER — Other Ambulatory Visit: Payer: Self-pay | Admitting: Oncology

## 2019-03-26 DIAGNOSIS — C189 Malignant neoplasm of colon, unspecified: Secondary | ICD-10-CM

## 2019-03-27 ENCOUNTER — Telehealth: Payer: Self-pay | Admitting: Oncology

## 2019-03-27 NOTE — Telephone Encounter (Signed)
Scheduled per los. Called and left msg. Mailed printout  °

## 2019-04-07 ENCOUNTER — Ambulatory Visit: Payer: 59 | Admitting: Legal Medicine

## 2019-04-07 ENCOUNTER — Encounter: Payer: Self-pay | Admitting: Family Medicine

## 2019-04-07 ENCOUNTER — Ambulatory Visit (INDEPENDENT_AMBULATORY_CARE_PROVIDER_SITE_OTHER): Payer: 59 | Admitting: Family Medicine

## 2019-04-07 ENCOUNTER — Other Ambulatory Visit: Payer: Self-pay

## 2019-04-07 VITALS — BP 116/78 | HR 80 | Temp 97.6°F | Resp 16 | Ht 65.0 in | Wt 201.0 lb

## 2019-04-07 DIAGNOSIS — E782 Mixed hyperlipidemia: Secondary | ICD-10-CM | POA: Diagnosis not present

## 2019-04-07 DIAGNOSIS — Z6833 Body mass index (BMI) 33.0-33.9, adult: Secondary | ICD-10-CM

## 2019-04-07 DIAGNOSIS — E7849 Other hyperlipidemia: Secondary | ICD-10-CM | POA: Insufficient documentation

## 2019-04-07 DIAGNOSIS — C189 Malignant neoplasm of colon, unspecified: Secondary | ICD-10-CM

## 2019-04-07 DIAGNOSIS — G620 Drug-induced polyneuropathy: Secondary | ICD-10-CM

## 2019-04-07 DIAGNOSIS — C772 Secondary and unspecified malignant neoplasm of intra-abdominal lymph nodes: Secondary | ICD-10-CM | POA: Diagnosis not present

## 2019-04-07 DIAGNOSIS — E6609 Other obesity due to excess calories: Secondary | ICD-10-CM

## 2019-04-07 DIAGNOSIS — Z6837 Body mass index (BMI) 37.0-37.9, adult: Secondary | ICD-10-CM | POA: Insufficient documentation

## 2019-04-07 DIAGNOSIS — I700 Atherosclerosis of aorta: Secondary | ICD-10-CM

## 2019-04-07 HISTORY — DX: Other hyperlipidemia: E78.49

## 2019-04-07 MED ORDER — ASPIRIN EC 81 MG PO TBEC: 81.0000 mg | DELAYED_RELEASE_TABLET | Freq: Every day | ORAL | 3 refills | Status: DC

## 2019-04-07 NOTE — Patient Instructions (Signed)
No changes to medicines. Low fat diet.  Exercise regularly. Baby aspirin daily.  High Cholesterol  High cholesterol is a condition in which the blood has high levels of a white, waxy, fat-like substance (cholesterol). The human body needs small amounts of cholesterol. The liver makes all the cholesterol that the body needs. Extra (excess) cholesterol comes from the food that we eat. Cholesterol is carried from the liver by the blood through the blood vessels. If you have high cholesterol, deposits (plaques) may build up on the walls of your blood vessels (arteries). Plaques make the arteries narrower and stiffer. Cholesterol plaques increase your risk for heart attack and stroke. Work with your health care provider to keep your cholesterol levels in a healthy range. What increases the risk? This condition is more likely to develop in people who:  Eat foods that are high in animal fat (saturated fat) or cholesterol.  Are overweight.  Are not getting enough exercise.  Have a family history of high cholesterol. What are the signs or symptoms? There are no symptoms of this condition. How is this diagnosed? This condition may be diagnosed from the results of a blood test.  If you are older than age 95, your health care provider may check your cholesterol every 4-6 years.  You may be checked more often if you already have high cholesterol or other risk factors for heart disease. The blood test for cholesterol measures:  "Bad" cholesterol (LDL cholesterol). This is the main type of cholesterol that causes heart disease. The desired level for LDL is less than 100.  "Good" cholesterol (HDL cholesterol). This type helps to protect against heart disease by cleaning the arteries and carrying the LDL away. The desired level for HDL is 60 or higher.  Triglycerides. These are fats that the body can store or burn for energy. The desired number for triglycerides is lower than 150.  Total  cholesterol. This is a measure of the total amount of cholesterol in your blood, including LDL cholesterol, HDL cholesterol, and triglycerides. A healthy number is less than 200. How is this treated? This condition is treated with diet changes, lifestyle changes, and medicines. Diet changes  This may include eating more whole grains, fruits, vegetables, nuts, and fish.  This may also include cutting back on red meat and foods that have a lot of added sugar. Lifestyle changes  Changes may include getting at least 40 minutes of aerobic exercise 3 times a week. Aerobic exercises include walking, biking, and swimming. Aerobic exercise along with a healthy diet can help you maintain a healthy weight.  Changes may also include quitting smoking. Medicines  Medicines are usually given if diet and lifestyle changes have failed to reduce your cholesterol to healthy levels.  Your health care provider may prescribe a statin medicine. Statin medicines have been shown to reduce cholesterol, which can reduce the risk of heart disease. Follow these instructions at home: Eating and drinking If told by your health care provider:  Eat chicken (without skin), fish, veal, shellfish, ground [place] breast, and round or loin cuts of red meat.  Do not eat fried foods or fatty meats, such as hot dogs and salami.  Eat plenty of fruits, such as apples.  Eat plenty of vegetables, such as broccoli, potatoes, and carrots.  Eat beans, peas, and lentils.  Eat grains such as barley, rice, couscous, and bulgur wheat.  Eat pasta without cream sauces.  Use skim or nonfat milk, and eat low-fat or nonfat yogurt and  cheeses.  Do not eat or drink whole milk, cream, ice cream, egg yolks, or hard cheeses.  Do not eat stick margarine or tub margarines that contain trans fats (also called partially hydrogenated oils).  Do not eat saturated tropical oils, such as coconut oil and palm oil.  Do not eat cakes, cookies,  crackers, or other baked goods that contain trans fats.  General instructions  Exercise as directed by your health care provider. Increase your activity level with activities such as gardening, walking, and taking the stairs.  Take over-the-counter and prescription medicines only as told by your health care provider.  Do not use any products that contain nicotine or tobacco, such as cigarettes and e-cigarettes. If you need help quitting, ask your health care provider.  Keep all follow-up visits as told by your health care provider. This is important. Contact a health care provider if:  You are struggling to maintain a healthy diet or weight.  You need help to start on an exercise program.  You need help to stop smoking. Get help right away if:  You have chest pain.  You have trouble breathing. This information is not intended to replace advice given to you by your health care provider. Make sure you discuss any questions you have with your health care provider. Document Revised: 12/22/2016 Document Reviewed: 06/19/2015 Elsevier Patient Education  [place].

## 2019-04-07 NOTE — Progress Notes (Signed)
Subjective:  Patient ID: Brenda Becker, female    DOB: [birth year]  Age: 49 y.o. MRN: [id number]  Chief Complaint  Patient presents with  . Hyperlipidemia  . Colon Cancer    HPI patient is a 49 year old white female who presents for follow-up of hyperlipidemia and colon cancer.  Her CT scan of chest and abdomen and pelvis done in December 2020 showed a new lesion in the mesentery.  Patient has continued to receive chemo intermittently since her diagnosis.  She will be starting a new course of chemotherapy.  For her hyperlipidemia she takes Zetia 10 mg once daily.  She has been intolerant to statins due to the combination of the statin with her chemotherapy.  In addition I did prescribe Nexletol however this was not covered by her insurance.  It was noted that she has atherosclerosis of her aorta on her CT scan of her chest in December.  Patient denies any symptoms-see review of systems.   Social History   Socioeconomic History  . Marital status: Married    Spouse name: Not on file  . Number of children: 1  . Years of education: Not on file  . Highest education level: Not on file  Occupational History  . 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 Smoking status: Never Smoker  . Smokeless tobacco: Never Used  Substance and Sexual Activity  . Alcohol use: No  . Drug use: No  . Sexual activity: Not Currently    Partners: Male    Comment: MARRIED- 25 YRS  Other Topics Concern  . Not on file  Social History Narrative  . Not on file   Social Determinants of Health   Financial Resource Strain:   . Difficulty of Paying Living Expenses:   Food Insecurity:   . Worried About Charity fundraiser in the Last Year:   . Arboriculturist in the Last Year:   Transportation Needs:   . Film/video editor (Medical):   Marland Kitchen Lack of Transportation (Non-Medical):   Physical Activity:   . Days of Exercise per Week:   . Minutes of Exercise per Session:   Stress:   .  Feeling of Stress :   Social Connections:   . Frequency of Communication with Friends and Family:   . Frequency of Social Gatherings with Friends and Family:   . Attends Religious Services:   . Active Member of Clubs or Organizations:   . Attends Archivist Meetings:   Marland Kitchen Marital Status:    Past Medical History:  Diagnosis Date  . Anxiety disorder   . Cancer [facility]) 2016   colon ca surgery and chemo  . Colon cancer ([place])   . Complication of anesthesia    N/V  . GERD (gastroesophageal reflux disease)   . Headache   . Hypomagnesemia   . Mixed hyperlipidemia   . PONV (postoperative nausea and vomiting)    vomit with Gallbladder surgery  . Toxic liver disease with hepatitis, not elsewhere classified    Family History  Problem Relation Age of Onset  . Other Father        doesn't go to doctor or get colonoscopies  . Other Brother        stomach issues; has had early colonoscopy  . Other Maternal Aunt        won't go to doctor; probably has not had colonoscopy  . Cancer Cousin        "  some kind of stomach cancer" dx. early 12s  . Skin cancer Maternal Grandmother        non-melanoma; dx. late 60s  . Prostate cancer Maternal Grandfather        dx. late 88s  . Stomach cancer Other        dx. "older"  . Colon cancer Other        dx. 83s  . Cancer Other        "female cancer" dx. "later age";   Marland Kitchen Breast cancer Other        dx. 50s-60s  . Breast cancer Paternal Grandmother        dx. 35s  . Cervical cancer Paternal Aunt 47       treated at [facility]  . Throat cancer Paternal Aunt 23       smoker    Review of Systems  Constitutional: Negative for chills, fatigue and fever.  HENT: Negative for congestion, ear pain and sore throat.   Respiratory: Negative for cough and shortness of breath.   Cardiovascular: Negative for chest pain.  Gastrointestinal: Negative for abdominal pain, constipation, diarrhea, nausea and vomiting.  Endocrine: Negative for polydipsia, polyphagia  and polyuria.  Genitourinary: Negative for dysuria and urgency.  Musculoskeletal: Negative for arthralgias and myalgias.  Neurological: Negative for dizziness and headaches.  Psychiatric/Behavioral: Negative for dysphoric mood. The patient is not nervous/anxious.      Objective:  BP 116/78   Pulse 80   Temp 97.6 F (36.4 C)   Resp 16   Ht 5\' 5"  (1.651 m)   Wt 201 lb (91.2 kg)   BMI 33.45 kg/m   BP/Weight 04/07/2019 03/25/2019 [id number]  Systolic BP [id number]  Diastolic BP 78 90 91  Wt. (Lbs) 201 203.9 205  BMI 33.45 34.46 34.64    Physical Exam Constitutional:      Appearance: Normal appearance. She is obese.  Neck:     Vascular: No carotid bruit.  Cardiovascular:     Rate and Rhythm: Normal rate.     Pulses: Normal pulses.     Heart sounds: Normal heart sounds.  Pulmonary:     Effort: Pulmonary effort is normal.     Breath sounds: Normal breath sounds.  Abdominal:     General: Bowel sounds are normal.     Palpations: Abdomen is soft.     Tenderness: There is no abdominal tenderness.  Neurological:     Mental Status: She is alert.  Psychiatric:        Mood and Affect: Mood normal.        Behavior: Behavior normal.       Diabetic Foot Exam - Simple   No data filed       Lab Results  Component Value Date   WBC 6.9 02/25/2019   HGB 12.8 02/25/2019   HCT 39.4 02/25/2019   PLT 250 02/25/2019   GLUCOSE 101 (H) 03/25/2019   ALT 15 03/25/2019   AST 14 (L) 03/25/2019   NA 141 03/25/2019   K 3.6 03/25/2019   CL 108 03/25/2019   CREATININE 1.03 (H) 03/25/2019   BUN 7 03/25/2019   CO2 26 03/25/2019   INR 0.94 12/22/2014      Assessment & Plan:  Mixed hyperlipidemia -.  No changes to medicines.  Continue to work on eating a healthy diet and exercise.  Labs drawn today.  Plan: Comprehensive metabolic panel, Lipid panel  Malignant neoplasm of colon, unspecified part of colon ([place]) MGMT per  oncology.   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  Plan: CBC with Differential/Platelet  Hypomagnesemia -   Plan: Magnesium  Drug-induced polyneuropathy (HCC) - no medicines needed.  Class 1 obesity due to excess calories with serious comorbidity and body mass index (BMI) of 33.0 to 33.9 in adult Recommend heatlhy diet and exercise.  Atherosclerosis of thoracic aorta - continue zetia. Recommend baby aspirin 81 mg once daily.   Follow-up: Return in about 3 months (around 07/07/2019) for fasting.  AVS was given to patient prior to departure.  Rochel Brome [facility] [phone]

## 2019-04-08 LAB — MAGNESIUM: Magnesium: 1.8 mg/dL (ref 1.6–2.3)

## 2019-04-08 LAB — COMPREHENSIVE METABOLIC PANEL
ALT: 14 IU/L (ref 0–32)
AST: 15 IU/L (ref 0–40)
Albumin/Globulin Ratio: 1.7 (ref 1.2–2.2)
Albumin: 4.3 g/dL (ref 3.8–4.8)
Alkaline Phosphatase: 152 IU/L — ABNORMAL HIGH (ref 39–117)
BUN/Creatinine Ratio: 7 — ABNORMAL LOW (ref 9–23)
BUN: 7 mg/dL (ref 6–24)
Bilirubin Total: 0.3 mg/dL (ref 0.0–1.2)
CO2: 23 mmol/L (ref 20–29)
Calcium: 9.6 mg/dL (ref 8.7–10.2)
Chloride: 105 mmol/L (ref 96–106)
Creatinine, Ser: 0.94 mg/dL (ref 0.57–1.00)
GFR calc Af Amer: 83 mL/min/{1.73_m2} (ref >59)
GFR calc non Af Amer: 72 mL/min/{1.73_m2} (ref >59)
Globulin, Total: 2.5 g/dL (ref 1.5–4.5)
Glucose: 94 mg/dL (ref 65–99)
Potassium: 4.9 mmol/L (ref 3.5–5.2)
Sodium: 145 mmol/L — ABNORMAL HIGH (ref 134–144)
Total Protein: 6.8 g/dL (ref 6.0–8.5)

## 2019-04-08 LAB — CBC WITH DIFFERENTIAL/PLATELET
Basophils Absolute: 0.1 10*3/uL (ref 0.0–0.2)
Basos: 1 %
EOS (ABSOLUTE): 0.4 10*3/uL (ref 0.0–0.4)
Eos: 6 %
Hematocrit: 44.2 % (ref 34.0–46.6)
Hemoglobin: 14.8 g/dL (ref 11.1–15.9)
Immature Grans (Abs): 0 10*3/uL (ref 0.0–0.1)
Immature Granulocytes: 0 %
Lymphocytes Absolute: 2.3 10*3/uL (ref 0.7–3.1)
Lymphs: 38 %
MCH: 28.2 pg (ref 26.6–33.0)
MCHC: 33.5 g/dL (ref 31.5–35.7)
MCV: 84 fL (ref 79–97)
Monocytes Absolute: 0.5 10*3/uL (ref 0.1–0.9)
Monocytes: 8 %
Neutrophils Absolute: 2.9 10*3/uL (ref 1.4–7.0)
Neutrophils: 47 %
Platelets: 251 10*3/uL (ref 150–450)
RBC: 5.24 x10E6/uL (ref 3.77–5.28)
RDW: 14.1 % (ref 11.7–15.4)
WBC: 6.1 10*3/uL (ref 3.4–10.8)

## 2019-04-08 LAB — LIPID PANEL
Chol/HDL Ratio: 5.7 ratio — ABNORMAL HIGH (ref 0.0–4.4)
Cholesterol, Total: 245 mg/dL — ABNORMAL HIGH (ref 100–199)
HDL: 43 mg/dL (ref >39)
LDL Chol Calc (NIH): 179 mg/dL — ABNORMAL HIGH (ref 0–99)
Triglycerides: 125 mg/dL (ref 0–149)
VLDL Cholesterol Cal: 23 mg/dL (ref 5–40)

## 2019-04-08 LAB — CARDIOVASCULAR RISK ASSESSMENT

## 2019-04-09 ENCOUNTER — Other Ambulatory Visit: Payer: Self-pay | Admitting: Oncology

## 2019-04-16 NOTE — Progress Notes (Signed)
Pharmacist Chemotherapy Monitoring - Follow Up Assessment    I verify that I have reviewed each item in the below checklist:  . Regimen for the patient is scheduled for the appropriate day and plan matches scheduled date. Marland Kitchen Appropriate non-routine labs are ordered dependent on drug ordered. . If applicable, additional medications reviewed and ordered per protocol based on lifetime cumulative doses and/or treatment regimen.   Plan for follow-up and/or issues identified: No . I-vent associated with next due treatment: No . MD and/or nursing notified: No  Brenda Becker 04/16/2019 1:07 PM

## 2019-04-17 ENCOUNTER — Telehealth: Payer: Self-pay | Admitting: *Deleted

## 2019-04-17 NOTE — Telephone Encounter (Signed)
Called to cancel her appointment on 04/22/19. Has decided to f/u and have treatment at [facility] under care of Dr. Aleatha Borer. Dr. [name] is aware.

## 2019-04-18 ENCOUNTER — Telehealth: Payer: Self-pay | Admitting: *Deleted

## 2019-04-18 NOTE — Telephone Encounter (Signed)
[facility] not able to get chemo authorized there since it is still linked to [facility]. Notified managed care to d/c the chemo w/insurance company for [facility] so [facility] can treat. Patient has transferred her care.

## 2019-04-22 ENCOUNTER — Ambulatory Visit: Payer: 59 | Admitting: Oncology

## 2019-04-22 ENCOUNTER — Other Ambulatory Visit: Payer: 59

## 2019-04-22 ENCOUNTER — Ambulatory Visit: Payer: 59

## 2019-05-26 ENCOUNTER — Ambulatory Visit (INDEPENDENT_AMBULATORY_CARE_PROVIDER_SITE_OTHER): Payer: 59 | Admitting: Obstetrics & Gynecology

## 2019-05-26 ENCOUNTER — Encounter: Payer: Self-pay | Admitting: Obstetrics & Gynecology

## 2019-05-26 ENCOUNTER — Other Ambulatory Visit: Payer: Self-pay

## 2019-05-26 VITALS — BP 128/76 | Ht 64.5 in | Wt 206.0 lb

## 2019-05-26 DIAGNOSIS — C189 Malignant neoplasm of colon, unspecified: Secondary | ICD-10-CM | POA: Diagnosis not present

## 2019-05-26 DIAGNOSIS — Z01419 Encounter for gynecological examination (general) (routine) without abnormal findings: Secondary | ICD-10-CM | POA: Diagnosis not present

## 2019-05-26 DIAGNOSIS — Z308 Encounter for other contraceptive management: Secondary | ICD-10-CM | POA: Diagnosis not present

## 2019-05-26 DIAGNOSIS — E6609 Other obesity due to excess calories: Secondary | ICD-10-CM | POA: Diagnosis not present

## 2019-05-26 DIAGNOSIS — Z6834 Body mass index (BMI) 34.0-34.9, adult: Secondary | ICD-10-CM

## 2019-05-26 NOTE — Progress Notes (Signed)
Brenda Becker [birth year] [id number]   History:    49 y.o. G1P1L1 Married.  Daughter is 4 yo  RP:  Established patient presenting for annual gyn exam   HPI: Stopped Junel Fe 1/20.  Had a light menstrual period since then.  Abstinent since last year.  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  Patient has cold sores around the mouth occasionally, never had genital HSV.  No pelvic pain. Chemotherapy for colon Cancer x 4 years.Breasts normal. Urine and bowel movements currently normal. Body mass index much improved from 38.87 last year to 34.81 now.  Health Labs Dr Clayborne Artist MD.   Past medical history,surgical history, family history and social history were all reviewed and documented in the EPIC chart.  Gynecologic History No LMP recorded. (Menstrual status: Chemotherapy).  Obstetric History OB History  Gravida Para Term Preterm AB Living  1 1       1   SAB TAB Ectopic Multiple Live Births               # Outcome Date GA Lbr Len/2nd Weight Sex Delivery Anes PTL Lv  1 Para              ROS: A ROS was performed and pertinent positives and negatives are included in the history.  GENERAL: No fevers or chills. HEENT: No change in vision, no earache, sore throat or sinus congestion. NECK: No pain or stiffness. CARDIOVASCULAR: No chest pain or pressure. No palpitations. PULMONARY: No shortness of breath, cough or wheeze. GASTROINTESTINAL: No abdominal pain, nausea, vomiting or diarrhea, melena or bright red blood per rectum. GENITOURINARY: No urinary frequency, urgency, hesitancy or dysuria. MUSCULOSKELETAL: No joint or muscle pain, no back pain, no recent trauma. DERMATOLOGIC: No rash, no itching, no lesions. ENDOCRINE: No polyuria, polydipsia, no heat or cold intolerance. No recent change in weight. HEMATOLOGICAL: No anemia or easy bruising or bleeding. NEUROLOGIC: No headache, seizures, numbness, tingling or weakness. PSYCHIATRIC: No depression, no loss of interest in normal  activity or change in sleep pattern.     Exam:   BP 128/76   Ht 5' 4.5" (1.638 m)   Wt 206 lb (93.4 kg)   BMI 34.81 kg/m   Body mass index is 34.81 kg/m.  General appearance : Well developed well nourished female. No acute distress HEENT: Eyes: no retinal hemorrhage or exudates,  Neck supple, trachea midline, no carotid bruits, no thyroidmegaly Lungs: Clear to auscultation, no rhonchi or wheezes, or rib retractions  Heart: Regular rate and rhythm, no murmurs or gallops Breast:Examined in sitting and supine position were symmetrical in appearance, no palpable masses or tenderness,  no skin retraction, no nipple inversion, no nipple discharge, no skin discoloration, no axillary or supraclavicular lymphadenopathy Abdomen: no palpable masses or tenderness, no rebound or guarding Extremities: no edema or skin discoloration or tenderness  Pelvic: Vulva: Normal             Vagina: No gross lesions or discharge  Cervix: No gross lesions or discharge  Uterus  AV, normal size, shape and consistency, non-tender and mobile  Adnexa  Without masses or tenderness  Anus: Normal   Assessment/Plan:  49 y.o. female for annual exam   1. Well female exam with routine gynecological exam Normal gynecologic exam.  Pap test May 2020 was negative, no indication to repeat this year.  Breast exam normal.  Screening mammogram July 2020 was negative.  Health labs with Dr. Tobie Poet.  2. Encounter for other contraceptive management  Currently abstinent, declines contraception.  3. Malignant neoplasm of colon, unspecified part of colon ([place]) On chemotherapy.  4. Class 1 obesity due to excess calories with serious comorbidity and body mass index (BMI) of 34.0 to 34.9 in adult Much improved body mass index since last year.  We will continue on a lower calorie/carb diet and regular fitness activities.  [name] MD, 9:33 AM 05/26/2019

## 2019-05-28 ENCOUNTER — Other Ambulatory Visit: Payer: Self-pay | Admitting: Oncology

## 2019-05-28 ENCOUNTER — Other Ambulatory Visit: Payer: Self-pay

## 2019-05-28 ENCOUNTER — Encounter: Payer: Self-pay | Admitting: Obstetrics & Gynecology

## 2019-05-28 DIAGNOSIS — C189 Malignant neoplasm of colon, unspecified: Secondary | ICD-10-CM

## 2019-05-28 MED ORDER — ESOMEPRAZOLE MAGNESIUM 20 MG PO CPDR: 40.0000 mg | DELAYED_RELEASE_CAPSULE | Freq: Every day | ORAL | 0 refills | Status: DC

## 2019-05-28 NOTE — Patient Instructions (Signed)
1. Well female exam with routine gynecological exam Normal gynecologic exam.  Pap test May 2020 was negative, no indication to repeat this year.  Breast exam normal.  Screening mammogram July 2020 was negative.  Health labs with Dr. Tobie Poet.  2. Encounter for other contraceptive management Currently abstinent, declines contraception.  3. Malignant neoplasm of colon, unspecified part of colon ([place]) On chemotherapy.  4. Class 1 obesity due to excess calories with serious comorbidity and body mass index (BMI) of 34.0 to 34.9 in adult Much improved body mass index since last year.  We will continue on a lower calorie/carb diet and regular fitness activities.  Brenda Becker, it was a pleasure seeing you today!

## 2019-06-23 ENCOUNTER — Other Ambulatory Visit: Payer: Self-pay | Admitting: Obstetrics & Gynecology

## 2019-06-23 DIAGNOSIS — Z1231 Encounter for screening mammogram for malignant neoplasm of breast: Secondary | ICD-10-CM

## 2019-06-30 ENCOUNTER — Other Ambulatory Visit: Payer: Self-pay

## 2019-06-30 MED ORDER — OMEPRAZOLE 40 MG PO CPDR: 40.0000 mg | DELAYED_RELEASE_CAPSULE | Freq: Every day | ORAL | 0 refills | Status: DC

## 2019-06-30 NOTE — Telephone Encounter (Signed)
Allea called to report that insurance will not cover the nexium and she is requesting that the prescription be changed to omeprazole 40 mg daily.  [place] the RX to be sent to [facility] for a 90 day supply.

## 2019-07-15 ENCOUNTER — Other Ambulatory Visit: Payer: Self-pay

## 2019-07-15 ENCOUNTER — Ambulatory Visit
Admission: RE | Admit: 2019-07-15 | Discharge: 2019-07-15 | Disposition: A | Discharge status: Home / Self Care | Payer: 59 | Source: Ambulatory Visit | Attending: Obstetrics & Gynecology | Admitting: Obstetrics & Gynecology

## 2019-07-15 DIAGNOSIS — Z1231 Encounter for screening mammogram for malignant neoplasm of breast: Secondary | ICD-10-CM

## 2019-07-21 ENCOUNTER — Other Ambulatory Visit: Payer: Self-pay | Admitting: Oncology

## 2019-07-23 ENCOUNTER — Other Ambulatory Visit: Payer: Self-pay

## 2019-07-23 ENCOUNTER — Other Ambulatory Visit: Payer: Self-pay | Admitting: Oncology

## 2019-07-23 DIAGNOSIS — C189 Malignant neoplasm of colon, unspecified: Secondary | ICD-10-CM

## 2019-08-05 NOTE — Progress Notes (Signed)
Subjective:  Patient ID: Brenda Becker, female    DOB: 11-22-1970  Age: 49 y.o. MRN: [id number]  Chief Complaint  Patient presents with  . Hyperlipidemia  . Colon Cancer    HPI   Patient is a 49 year old white female who presents for follow-up of hyperlipidemia and colon cancer.  Her CT scan of chest and abdomen and pelvis done in December 2020 showed a new lesion in the mesentery.  Patient has continued to receive chemo intermittently since her diagnosis.   For her hyperlipidemia she has been intolerant to statins due to the combination of the statin with her chemotherapy. Was tolerating zetia, but ran out. In addition I did prescribe Nexletol however this was not covered by her insurance.  It was noted that she has atherosclerosis of her aorta on her CT scan of her chest in December.  Patient denies any symptoms.. Apple cider vinegar and honey started last week. Helps with GERD. Hopes it helps with decreasing cholesterol.   Social History   Socioeconomic History  . Marital status: Married    Spouse name: Not on file  . Number of children: 1  . Years of education: Not on file  . Highest education level: Not on file  Occupational History  . 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 Smoking status: Never Smoker  . Smokeless tobacco: Never Used  Vaping Use  . Vaping Use: Never used  Substance and Sexual Activity  . Alcohol use: No  . Drug use: No  . Sexual activity: Yes    Partners: Male    Comment: MARRIED- 97 YRS  Other Topics Concern  . Not on file  Social History Narrative  . Not on file   Social Determinants of Health   Financial Resource Strain:   . Difficulty of Paying Living Expenses:   Food Insecurity:   . Worried About Charity fundraiser in the Last Year:   . Arboriculturist in the Last Year:   Transportation Needs:   . Film/video editor (Medical):   Marland Kitchen Lack of Transportation (Non-Medical):   Physical Activity:   . Days of  Exercise per Week:   . Minutes of Exercise per Session:   Stress:   . Feeling of Stress :   Social Connections:   . Frequency of Communication with Friends and Family:   . Frequency of Social Gatherings with Friends and Family:   . Attends Religious Services:   . Active Member of Clubs or Organizations:   . Attends Archivist Meetings:   Marland Kitchen Marital Status:    Past Medical History:  Diagnosis Date  . Anxiety disorder   . Cancer [facility]) 2016   colon ca surgery and chemo  . Colon cancer ([place])   . Complication of anesthesia    N/V  . GERD (gastroesophageal reflux disease)   . Headache   . Hypomagnesemia   . Mixed hyperlipidemia   . PONV (postoperative nausea and vomiting)    vomit with Gallbladder surgery  . Toxic liver disease with hepatitis, not elsewhere classified    Family History  Problem Relation Age of Onset  . Other Father        doesn't go to doctor or get colonoscopies  . Other Brother        stomach issues; has had early colonoscopy  . Other Maternal Aunt        won't go to doctor; probably has not had  colonoscopy  . Cancer Cousin        "some kind of stomach cancer" dx. early 83s  . Skin cancer Maternal Grandmother        non-melanoma; dx. late 60s  . Prostate cancer Maternal Grandfather        dx. late 40s  . Stomach cancer Other        dx. "older"  . Colon cancer Other        dx. 44s  . Cancer Other        "female cancer" dx. "later age";   Marland Kitchen Breast cancer Other        dx. 50s-60s  . Breast cancer Paternal Grandmother        dx. 57s  . Cervical cancer Paternal Aunt 80       [facility] at [facility]  . Throat cancer Paternal Aunt 42       smoker    Review of Systems  Constitutional: Negative for chills, fatigue and fever.  HENT: Negative for congestion, ear pain and sore throat.   Respiratory: Negative for cough and shortness of breath.   Cardiovascular: Negative for chest pain.  Gastrointestinal: Negative for abdominal pain, constipation,  diarrhea, nausea and vomiting.  Endocrine: Positive for polydipsia. Negative for polyphagia and polyuria.  Genitourinary: Negative for dysuria and urgency.  Musculoskeletal: Positive for arthralgias (left thumb). Negative for myalgias.  Skin: Positive for rash (acne (takes vibramycin) ).  Neurological: Negative for dizziness and headaches.  Psychiatric/Behavioral: Negative for dysphoric mood. The patient is not nervous/anxious.      Objective:  BP 116/76   Pulse 84   Temp 97.6 F (36.4 C)   Ht 5\' 5"  (1.651 m)   Wt 209 lb (94.8 kg)   BMI 34.78 kg/m   BP/Weight 08/06/2019 3/79/0240 09/09/3530  Systolic BP 992 426 834  Diastolic BP 76 76 78  Wt. (Lbs) 209 206 201  BMI 34.78 34.81 33.45    Physical Exam Constitutional:      Appearance: Normal appearance. She is obese.  Neck:     Vascular: No carotid bruit.  Cardiovascular:     Rate and Rhythm: Normal rate.     Pulses: Normal pulses.     Heart sounds: Normal heart sounds.  Pulmonary:     Effort: Pulmonary effort is normal.     Breath sounds: Normal breath sounds.  Abdominal:     General: Bowel sounds are normal.     Palpations: Abdomen is soft.     Tenderness: There is no abdominal tenderness.  Neurological:     Mental Status: She is alert.  Psychiatric:        Mood and Affect: Mood normal.        Behavior: Behavior normal.       Diabetic Foot Exam - Simple   No data filed       Lab Results  Component Value Date   WBC 6.1 04/07/2019   HGB 14.8 04/07/2019   HCT 44.2 04/07/2019   PLT 251 04/07/2019   GLUCOSE 94 04/07/2019   CHOL 245 (H) 04/07/2019   TRIG 125 04/07/2019   HDL 43 04/07/2019   LDLCALC 179 (H) 04/07/2019   ALT 14 04/07/2019   AST 15 04/07/2019   NA 145 (H) 04/07/2019   K 4.9 04/07/2019   CL 105 04/07/2019   CREATININE 0.94 04/07/2019   BUN 7 04/07/2019   CO2 23 04/07/2019   INR 0.94 12/22/2014      Assessment & Plan:  Mixed hyperlipidemia -.  No changes to medicines.  Continue to  work on eating a healthy diet and exercise.  Labs drawn today.   Plan: Comprehensive metabolic panel, Lipid panel  Malignant neoplasm of colon, unspecified part of colon ([place]) MGMT per oncology.   Metastatic cancer to intra-abdominal lymph nodes ([place]) - MGMT per oncology.  Plan: CBC with Differential/Platelet  Hypomagnesemia -   Plan: Magnesium  Drug-induced polyneuropathy (HCC) - no medicines needed.  Class 1 obesity due to excess calories with serious comorbidity and body mass index (BMI) of 33.0 to 33.9 in adult Recommend heatlhy diet and exercise. Start on phentermine 37.5 mg one daily.  Atherosclerosis of thoracic aorta - continue zetia. Recommend baby aspirin 81 mg once daily.   Follow-up: Return in about 4 weeks (around 09/03/2019) for weight management. Follow up in 3 months fasting also.  AVS was given to patient prior to departure.  Rochel Brome [facility] [phone]

## 2019-08-06 ENCOUNTER — Ambulatory Visit (INDEPENDENT_AMBULATORY_CARE_PROVIDER_SITE_OTHER): Payer: 59 | Admitting: Family Medicine

## 2019-08-06 ENCOUNTER — Other Ambulatory Visit: Payer: Self-pay

## 2019-08-06 ENCOUNTER — Encounter: Payer: Self-pay | Admitting: Family Medicine

## 2019-08-06 VITALS — BP 116/76 | HR 84 | Temp 97.6°F | Ht 65.0 in | Wt 209.0 lb

## 2019-08-06 DIAGNOSIS — C772 Secondary and unspecified malignant neoplasm of intra-abdominal lymph nodes: Secondary | ICD-10-CM | POA: Diagnosis not present

## 2019-08-06 DIAGNOSIS — E6609 Other obesity due to excess calories: Secondary | ICD-10-CM | POA: Diagnosis not present

## 2019-08-06 DIAGNOSIS — E782 Mixed hyperlipidemia: Secondary | ICD-10-CM | POA: Diagnosis not present

## 2019-08-06 DIAGNOSIS — I700 Atherosclerosis of aorta: Secondary | ICD-10-CM

## 2019-08-06 DIAGNOSIS — Z6834 Body mass index (BMI) 34.0-34.9, adult: Secondary | ICD-10-CM

## 2019-08-06 MED ORDER — PHENTERMINE HCL 37.5 MG PO CAPS: 37.5000 mg | ORAL_CAPSULE | ORAL | 0 refills | Status: DC

## 2019-08-06 MED ORDER — EZETIMIBE 10 MG PO TABS: 10.0000 mg | ORAL_TABLET | Freq: Every day | ORAL | 1 refills | Status: DC

## 2019-08-06 NOTE — Patient Instructions (Signed)
Start on phentermine 37.5 mg once daily in am.  Start on zetia 10 mg once daily.   Heart-Healthy Eating Plan Many factors influence your heart (coronary) health, including eating and exercise habits. Coronary risk increases with abnormal blood fat (lipid) levels. Heart-healthy meal planning includes limiting unhealthy fats, increasing healthy fats, and making other diet and lifestyle changes. What are tips for following this plan? Cooking Cook foods using methods other than frying. Baking, boiling, grilling, and broiling are all good options. Other ways to reduce fat include:  Removing the skin from poultry.  Removing all visible fats from meats.  Steaming vegetables in water or broth. Meal planning   At meals, imagine dividing your plate into fourths: ? Fill one-half of your plate with vegetables and green salads. ? Fill one-fourth of your plate with whole grains. ? Fill one-fourth of your plate with lean protein foods.  Eat 4-5 servings of vegetables per day. One serving equals 1 cup raw or cooked vegetable, or 2 cups raw leafy greens.  Eat 4-5 servings of fruit per day. One serving equals 1 medium whole fruit,  cup dried fruit,  cup fresh, frozen, or canned fruit, or  cup 100% fruit juice.  Eat more foods that contain soluble fiber. Examples include apples, broccoli, carrots, beans, peas, and barley. Aim to get 25-30 g of fiber per day.  Increase your consumption of legumes, nuts, and seeds to 4-5 servings per week. One serving of dried beans or legumes equals  cup cooked, 1 serving of nuts is  cup, and 1 serving of seeds equals 1 tablespoon. Fats  Choose healthy fats more often. Choose monounsaturated and polyunsaturated fats, such as olive and canola oils, flaxseeds, walnuts, almonds, and seeds.  Eat more omega-3 fats. Choose salmon, mackerel, sardines, tuna, flaxseed oil, and ground flaxseeds. Aim to eat fish at least 2 times each week.  Check food labels carefully to  identify foods with trans fats or high amounts of saturated fat.  Limit saturated fats. These are found in animal products, such as meats, butter, and cream. Plant sources of saturated fats include palm oil, palm kernel oil, and coconut oil.  Avoid foods with partially hydrogenated oils in them. These contain trans fats. Examples are stick margarine, some tub margarines, cookies, crackers, and other baked goods.  Avoid fried foods. General information  Eat more home-cooked food and less restaurant, buffet, and fast food.  Limit or avoid alcohol.  Limit foods that are high in starch and sugar.  Lose weight if you are overweight. Losing just 5-10% of your body weight can help your overall health and prevent diseases such as diabetes and heart disease.  Monitor your salt (sodium) intake, especially if you have high blood pressure. Talk with your health care provider about your sodium intake.  Try to incorporate more vegetarian meals weekly. What foods can I eat? Fruits All fresh, canned (in natural juice), or frozen fruits. Vegetables Fresh or frozen vegetables (raw, steamed, roasted, or grilled). Green salads. Grains Most grains. Choose whole wheat and whole grains most of the time. Rice and pasta, including brown rice and pastas made with whole wheat. Meats and other proteins Lean, well-trimmed beef, veal, pork, and lamb. Chicken and [place] without skin. All fish and shellfish. Wild duck, rabbit, pheasant, and venison. Egg whites or low-cholesterol egg substitutes. Dried beans, peas, lentils, and tofu. Seeds and most nuts. Dairy Low-fat or nonfat cheeses, including ricotta and mozzarella. Skim or 1% milk (liquid, powdered, or evaporated). Buttermilk made  with low-fat milk. Nonfat or low-fat yogurt. Fats and oils Non-hydrogenated (trans-free) margarines. Vegetable oils, including soybean, sesame, sunflower, olive, peanut, safflower, corn, canola, and cottonseed. Salad dressings or  mayonnaise made with a vegetable oil. Beverages Water (mineral or sparkling). Coffee and tea. Diet carbonated beverages. Sweets and desserts Sherbet, gelatin, and fruit ice. Small amounts of dark chocolate. Limit all sweets and desserts. Seasonings and condiments All seasonings and condiments. The items listed above may not be a complete list of foods and beverages you can eat. Contact a dietitian for more options. What foods are not recommended? Fruits Canned fruit in heavy syrup. Fruit in cream or butter sauce. Fried fruit. Limit coconut. Vegetables Vegetables cooked in cheese, cream, or butter sauce. Fried vegetables. Grains Breads made with saturated or trans fats, oils, or whole milk. Croissants. Sweet rolls. Donuts. High-fat crackers, such as cheese crackers. Meats and other proteins Fatty meats, such as hot dogs, ribs, sausage, bacon, rib-eye roast or steak. High-fat deli meats, such as salami and bologna. Caviar. Domestic duck and goose. Organ meats, such as liver. Dairy Cream, sour cream, cream cheese, and creamed cottage cheese. Whole milk cheeses. Whole or 2% milk (liquid, evaporated, or condensed). Whole buttermilk. Cream sauce or high-fat cheese sauce. Whole-milk yogurt. Fats and oils Meat fat, or shortening. Cocoa butter, hydrogenated oils, palm oil, coconut oil, palm kernel oil. Solid fats and shortenings, including bacon fat, salt pork, lard, and butter. Nondairy cream substitutes. Salad dressings with cheese or sour cream. Beverages Regular sodas and any drinks with added sugar. Sweets and desserts Frosting. Pudding. Cookies. Cakes. Pies. Milk chocolate or white chocolate. Buttered syrups. Full-fat ice cream or ice cream drinks. The items listed above may not be a complete list of foods and beverages to avoid. Contact a dietitian for more information. Summary  Heart-healthy meal planning includes limiting unhealthy fats, increasing healthy fats, and making other diet and  lifestyle changes.  Lose weight if you are overweight. Losing just 5-10% of your body weight can help your overall health and prevent diseases such as diabetes and heart disease.  Focus on eating a balance of foods, including fruits and vegetables, low-fat or nonfat dairy, lean protein, nuts and legumes, whole grains, and heart-healthy oils and fats. This information is not intended to replace advice given to you by your health care provider. Make sure you discuss any questions you have with your health care provider. Document Revised: 01/26/2017 Document Reviewed: 01/26/2017 Elsevier Patient Education  2020 [employer].

## 2019-08-07 LAB — CBC WITH DIFFERENTIAL/PLATELET
Basophils Absolute: 0.1 10*3/uL (ref 0.0–0.2)
Basos: 1 %
EOS (ABSOLUTE): 0.4 10*3/uL (ref 0.0–0.4)
Eos: 7 %
Hematocrit: 43.1 % (ref 34.0–46.6)
Hemoglobin: 13.8 g/dL (ref 11.1–15.9)
Immature Grans (Abs): 0 10*3/uL (ref 0.0–0.1)
Immature Granulocytes: 0 %
Lymphocytes Absolute: 2.1 10*3/uL (ref 0.7–3.1)
Lymphs: 38 %
MCH: 26.9 pg (ref 26.6–33.0)
MCHC: 32 g/dL (ref 31.5–35.7)
MCV: 84 fL (ref 79–97)
Monocytes Absolute: 0.5 10*3/uL (ref 0.1–0.9)
Monocytes: 9 %
Neutrophils Absolute: 2.5 10*3/uL (ref 1.4–7.0)
Neutrophils: 45 %
Platelets: 226 10*3/uL (ref 150–450)
RBC: 5.13 x10E6/uL (ref 3.77–5.28)
RDW: 14 % (ref 11.7–15.4)
WBC: 5.5 10*3/uL (ref 3.4–10.8)

## 2019-08-07 LAB — COMPREHENSIVE METABOLIC PANEL
ALT: 17 IU/L (ref 0–32)
AST: 18 IU/L (ref 0–40)
Albumin/Globulin Ratio: 1.6 (ref 1.2–2.2)
Albumin: 4.4 g/dL (ref 3.8–4.8)
Alkaline Phosphatase: 119 IU/L (ref 48–121)
BUN/Creatinine Ratio: 11 (ref 9–23)
BUN: 11 mg/dL (ref 6–24)
Bilirubin Total: 0.4 mg/dL (ref 0.0–1.2)
CO2: 24 mmol/L (ref 20–29)
Calcium: 10 mg/dL (ref 8.7–10.2)
Chloride: 107 mmol/L — ABNORMAL HIGH (ref 96–106)
Creatinine, Ser: 1.02 mg/dL — ABNORMAL HIGH (ref 0.57–1.00)
GFR calc Af Amer: 75 mL/min/{1.73_m2} (ref >59)
GFR calc non Af Amer: 65 mL/min/{1.73_m2} (ref >59)
Globulin, Total: 2.7 g/dL (ref 1.5–4.5)
Glucose: 96 mg/dL (ref 65–99)
Potassium: 4.7 mmol/L (ref 3.5–5.2)
Sodium: 145 mmol/L — ABNORMAL HIGH (ref 134–144)
Total Protein: 7.1 g/dL (ref 6.0–8.5)

## 2019-08-07 LAB — LIPID PANEL
Chol/HDL Ratio: 6.2 ratio — ABNORMAL HIGH (ref 0.0–4.4)
Cholesterol, Total: 265 mg/dL — ABNORMAL HIGH (ref 100–199)
HDL: 43 mg/dL (ref >39)
LDL Chol Calc (NIH): 208 mg/dL — ABNORMAL HIGH (ref 0–99)
Triglycerides: 82 mg/dL (ref 0–149)
VLDL Cholesterol Cal: 14 mg/dL (ref 5–40)

## 2019-08-07 LAB — CARDIOVASCULAR RISK ASSESSMENT

## 2019-09-03 NOTE — Progress Notes (Signed)
Subjective:  Patient ID: Brenda Becker, female    DOB: Feb 18, 1970  Age: 49 y.o. MRN: [id number]  Chief Complaint  Patient presents with  . weight management    HPI  Presents for weight management-taking phentermine. Has lost 9 lbs. Eats healthy. Cut sodas. Cut biscuits. Cholesterol was high still. Unable to take statins. Taking zetia. Patient has a very active job. Not exercising outside.  Current Outpatient Medications on File Prior to Visit  Medication Sig Dispense Refill  . doxycycline (VIBRAMYCIN) 100 MG capsule TAKE ONE CAPSULE BY MOUTH TWICE DAILY 60 capsule 2  . ezetimibe (ZETIA) 10 MG tablet Take 1 tablet (10 mg total) by mouth daily. 90 tablet 1  . Magnesium 400 MG CAPS Take 400 mg by mouth 4 (four) times daily. 112 capsule 1  . omeprazole (PRILOSEC) 40 MG capsule Take 1 capsule (40 mg total) by mouth daily. 90 capsule 0  . potassium chloride SA (KLOR-CON) 20 MEQ tablet TAKE ONE TABLET BY MOUTH TWICE DAILY 60 tablet 1   Current Facility-Administered Medications on File Prior to Visit  Medication Dose Route Frequency Provider Last Rate Last Admin  . sodium chloride flush (NS) 0.9 % injection 10 mL  10 mL Intravenous PRN Ladell Pier, MD   10 mL at 05/28/15 3500   Past Medical History:  Diagnosis Date  . Anxiety disorder   . Cancer [facility]) 2016   colon ca surgery and chemo  . Colon cancer ([place])   . Complication of anesthesia    N/V  . GERD (gastroesophageal reflux disease)   . Headache   . Hypomagnesemia   . Mixed hyperlipidemia   . PONV (postoperative nausea and vomiting)    vomit with Gallbladder surgery  . Toxic liver disease with hepatitis, not elsewhere classified    Past Surgical History:  Procedure Laterality Date  . BREAST BIOPSY    . CHOLECYSTECTOMY    . COLON SURGERY  2016   cancer  . EUS N/A 02/17/2016   Procedure: UPPER ENDOSCOPIC ULTRASOUND (EUS) RADIAL;  Surgeon: Milus Banister, MD;  Location: [facility] ENDOSCOPY;  Service: Endoscopy;  Laterality: N/A;   . EUS N/A 07/20/2016   Procedure: UPPER ENDOSCOPIC ULTRASOUND (EUS) RADIAL;  Surgeon: Milus Banister, MD;  Location: [facility] ENDOSCOPY;  Service: Endoscopy;  Laterality: N/A;  . KNEE ARTHROSCOPY Right   . TONSILLECTOMY  early 20's    Family History  Problem Relation Age of Onset  . Other Father        doesn't go to doctor or get colonoscopies  . Other Brother        stomach issues; has had early colonoscopy  . Other Maternal Aunt        won't go to doctor; probably has not had colonoscopy  . Cancer Cousin        "some kind of stomach cancer" dx. early 16s  . Skin cancer Maternal Grandmother        non-melanoma; dx. late 60s  . Prostate cancer Maternal Grandfather        dx. late 61s  . Stomach cancer Other        dx. "older"  . Colon cancer Other        dx. 37s  . Cancer Other        "female cancer" dx. "later age";   Marland Kitchen Breast cancer Other        dx. 50s-60s  . Breast cancer Paternal Grandmother        dx.  35s  . Cervical cancer Paternal Aunt 57       [facility]  . Throat cancer Paternal Aunt 47       smoker   Social History   Socioeconomic History  . Marital status: Married    Spouse name: Not on file  . Number of children: 1  . Years of education: Not on file  . Highest education level: Not on file  Occupational History  . 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 Smoking status: Never Smoker  . Smokeless tobacco: Never Used  Vaping Use  . Vaping Use: Never used  Substance and Sexual Activity  . Alcohol use: No  . Drug use: No  . Sexual activity: Yes    Partners: Male    Comment: MARRIED- 27 YRS  Other Topics Concern  . Not on file  Social History Narrative  . Not on file   Social Determinants of Health   Financial Resource Strain:   . Difficulty of Paying Living Expenses: Not on file  Food Insecurity:   . Worried About Charity fundraiser in the Last Year: Not on file  . Ran Out of Food in the Last Year: Not on file    Transportation Needs:   . Lack of Transportation (Medical): Not on file  . Lack of Transportation (Non-Medical): Not on file  Physical Activity:   . Days of Exercise per Week: Not on file  . Minutes of Exercise per Session: Not on file  Stress:   . Feeling of Stress : Not on file  Social Connections:   . Frequency of Communication with Friends and Family: Not on file  . Frequency of Social Gatherings with Friends and Family: Not on file  . Attends Religious Services: Not on file  . Active Member of Clubs or Organizations: Not on file  . Attends Archivist Meetings: Not on file  . Marital Status: Not on file    Review of Systems  Constitutional: Negative for chills, fatigue and fever.  HENT: Negative for congestion, ear pain, rhinorrhea and sore throat.   Respiratory: Negative for cough and shortness of breath.   Cardiovascular: Negative for chest pain.  Gastrointestinal: Negative for abdominal pain, constipation, diarrhea, nausea and vomiting.  Genitourinary: Negative for dysuria and urgency.  Musculoskeletal: Negative for back pain and myalgias.  Neurological: Negative for dizziness, weakness, light-headedness and headaches.  Psychiatric/Behavioral: Negative for dysphoric mood. The patient is not nervous/anxious.      Objective:  BP 110/82   Pulse 73   Temp (!) 97.3 F (36.3 C)   Wt 200 lb (90.7 kg)   SpO2 100%   BMI 33.28 kg/m   BP/Weight 09/04/2019 08/06/2019 1/44/3154  Systolic BP 008 676 195  Diastolic BP 82 76 76  Wt. (Lbs) 200 209 206  BMI 33.28 34.78 34.81    Physical Exam Vitals reviewed.  Constitutional:      Appearance: Normal appearance. She is obese.  Cardiovascular:     Rate and Rhythm: Normal rate and regular rhythm.     Pulses: Normal pulses.     Heart sounds: Normal heart sounds.  Pulmonary:     Effort: Pulmonary effort is normal. No respiratory distress.     Breath sounds: Normal breath sounds.  Neurological:     Mental Status: She  is alert and oriented to person, place, and time.  Psychiatric:        Mood and Affect: Mood normal.  Behavior: Behavior normal.     Diabetic Foot Exam - Simple   No data filed       Lab Results  Component Value Date   WBC 5.5 08/06/2019   HGB 13.8 08/06/2019   HCT 43.1 08/06/2019   PLT 226 08/06/2019   GLUCOSE 96 08/06/2019   CHOL 265 (H) 08/06/2019   TRIG 82 08/06/2019   HDL 43 08/06/2019   LDLCALC 208 (H) 08/06/2019   ALT 17 08/06/2019   AST 18 08/06/2019   NA 145 (H) 08/06/2019   K 4.7 08/06/2019   CL 107 (H) 08/06/2019   CREATININE 1.02 (H) 08/06/2019   BUN 11 08/06/2019   CO2 24 08/06/2019   INR 0.94 12/22/2014      Assessment & Plan:   1. Mixed hyperlipidemia Well controlled.  No changes to medicines.  Continue to work on eating a healthy diet and exercise.    2. Class 1 obesity due to excess calories with serious comorbidity and body mass index (BMI) of 33.0 to 33.9 in adult  Improved  No changes to medicines.  Continue to work on eating a healthy diet and exercise.   3. Flu shot given. Flucelvex..   Meds ordered this encounter  Medications  . phentermine 37.5 MG capsule    Sig: Take 1 capsule (37.5 mg total) by mouth every morning.    Dispense:  30 capsule    Refill:  1    Orders Placed This Encounter  Procedures  . Flu Vaccine MDCK QUAD PF     Follow-up: Return in about 2 months (around 11/04/2019).  An After Visit Summary was printed and given to the patient.  Rochel Brome [facility] [phone]

## 2019-09-04 ENCOUNTER — Other Ambulatory Visit: Payer: Self-pay

## 2019-09-04 ENCOUNTER — Encounter: Payer: Self-pay | Admitting: Family Medicine

## 2019-09-04 ENCOUNTER — Ambulatory Visit (INDEPENDENT_AMBULATORY_CARE_PROVIDER_SITE_OTHER): Payer: 59 | Admitting: Family Medicine

## 2019-09-04 VITALS — BP 110/82 | HR 73 | Temp 97.3°F | Wt 200.0 lb

## 2019-09-04 DIAGNOSIS — Z6833 Body mass index (BMI) 33.0-33.9, adult: Secondary | ICD-10-CM | POA: Diagnosis not present

## 2019-09-04 DIAGNOSIS — Z23 Encounter for immunization: Secondary | ICD-10-CM | POA: Diagnosis not present

## 2019-09-04 DIAGNOSIS — E782 Mixed hyperlipidemia: Secondary | ICD-10-CM

## 2019-09-04 DIAGNOSIS — E6609 Other obesity due to excess calories: Secondary | ICD-10-CM | POA: Diagnosis not present

## 2019-09-04 MED ORDER — PHENTERMINE HCL 37.5 MG PO CAPS: 37.5000 mg | ORAL_CAPSULE | ORAL | 1 refills | Status: DC

## 2019-09-16 ENCOUNTER — Ambulatory Visit: Payer: 59

## 2019-09-16 ENCOUNTER — Other Ambulatory Visit: Payer: Self-pay

## 2019-09-16 DIAGNOSIS — Z23 Encounter for immunization: Secondary | ICD-10-CM

## 2019-09-16 NOTE — Progress Notes (Signed)
   Covid-19 Vaccination Clinic  Name:  Brenda Becker    MRN: [id number] DOB: [birth year]  09/16/2019  [name] was observed post Covid-19 immunization for 15 minutes without incident. She was provided with Vaccine Information Sheet and instruction to access the V-Safe system.   [name] was instructed to call 911 with any severe reactions post vaccine: Marland Kitchen Difficulty breathing  . Swelling of face and throat  . A fast heartbeat  . A bad rash all over body  . Dizziness and weakness

## 2019-10-29 ENCOUNTER — Other Ambulatory Visit: Payer: Self-pay | Admitting: Family Medicine

## 2019-11-06 NOTE — Progress Notes (Signed)
Subjective:  Patient ID: Brenda Becker, female    DOB: 12/16/1970  Age: 49 y.o. MRN: [id number]  Chief Complaint  Patient presents with  . Hyperlipidemia  . Weight Loss    HPI  Patient is a 49 year old white female who presents for follow-up of hyperlipidemia and colon cancer.  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 Patient has continued to receive chemo intermittently since her diagnosis.    For her familial hyperlipidemia she has been intolerant to statins due to the combination of the statin with her chemotherapy. She is on a break for 3 months chemo. Gets rescanned in 3 months.  Taking zetia 10 mg once daily.  Was tolerating zetia, but ran out. In addition I did prescribe Nexletol however this was not covered by her insurance.  Her father has CAD and has had AMI and her LDL has been over 5 meeting requirements of Familial hyperlipidemia.  Aortic atherosclerosis: on zetia.  Eating healthy.  Hypomagnesemia successfully controlled with magnesium supplements.   Weight has leveled off despite working hard on diet and exercise. She has not been taking phentermine every daily. Overall her weight has dropped in the last 3 yrs by approximately 30 lbs.   Social History   Socioeconomic History  . Marital status: Married    Spouse name: Not on file  . Number of children: 1  . Years of education: Not on file  . Highest education level: Not on file  Occupational History  . 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 Smoking status: Never Smoker  . Smokeless tobacco: Never Used  Vaping Use  . Vaping Use: Never used  Substance and Sexual Activity  . Alcohol use: No  . Drug use: No  . Sexual activity: Yes    Partners: Male    Comment: MARRIED- 13 YRS  Other Topics Concern  . Not on file  Social History Narrative  . Not on file   Social Determinants of Health   Financial Resource Strain:   . Difficulty of Paying Living Expenses: Not  on file  Food Insecurity:   . Worried About Charity fundraiser in the Last Year: Not on file  . Ran Out of Food in the Last Year: Not on file  Transportation Needs:   . Lack of Transportation (Medical): Not on file  . Lack of Transportation (Non-Medical): Not on file  Physical Activity:   . Days of Exercise per Week: Not on file  . Minutes of Exercise per Session: Not on file  Stress:   . Feeling of Stress : Not on file  Social Connections:   . Frequency of Communication with Friends and Family: Not on file  . Frequency of Social Gatherings with Friends and Family: Not on file  . Attends Religious Services: Not on file  . Active Member of Clubs or Organizations: Not on file  . Attends Archivist Meetings: Not on file  . Marital Status: Not on file   Past Medical History:  Diagnosis Date  . Anxiety disorder   . Cancer [facility]) 2016   colon ca surgery and chemo  . Colon cancer ([place])   . Complication of anesthesia    N/V  . GERD (gastroesophageal reflux disease)   . Headache   . Hypomagnesemia   . Mixed hyperlipidemia   . PONV (postoperative nausea and vomiting)    vomit with Gallbladder surgery  . Toxic liver disease with hepatitis, not elsewhere  classified    Family History  Problem Relation Age of Onset  . Other Father        doesn't go to doctor or get colonoscopies  . Other Brother        stomach issues; has had early colonoscopy  . Other Maternal Aunt        won't go to doctor; probably has not had colonoscopy  . Cancer Cousin        "some kind of stomach cancer" dx. early 22s  . Skin cancer Maternal Grandmother        non-melanoma; dx. late 60s  . Prostate cancer Maternal Grandfather        dx. late 10s  . Stomach cancer Other        dx. "older"  . Colon cancer Other        dx. 67s  . Cancer Other        "female cancer" dx. "later age";   Marland Kitchen Breast cancer Other        dx. 50s-60s  . Breast cancer Paternal Grandmother        dx. 21s  . Cervical  cancer Paternal Aunt 99       [facility] at [facility]  . Throat cancer Paternal Aunt 65       smoker   Review of Systems  Constitutional: Negative for chills, diaphoresis, fever and malaise/fatigue.  HENT: Negative for congestion, ear pain and sore throat.   Respiratory: Negative for cough and sputum production.   Cardiovascular: Negative for chest pain and palpitations.  Gastrointestinal: Negative for abdominal pain, constipation, diarrhea, heartburn, nausea and vomiting.  Genitourinary: Negative for dysuria and urgency.  Musculoskeletal: Positive for back pain (strained over the last week. ). Negative for myalgias.  Neurological: Negative for dizziness and headaches.  Psychiatric/Behavioral: Negative for depression. The patient is not nervous/anxious.       Objective:  BP 122/78   Pulse 80   Temp (!) 97.4 F (36.3 C)   Resp 14   Ht 5\' 5"  (1.651 m)   Wt 204 lb 9.6 oz (92.8 kg)   BMI 34.05 kg/m   BP/Weight 11/07/2019 02/09/348 0/09/3816  Systolic BP 299 371 696  Diastolic BP 78 82 76  Wt. (Lbs) 204.6 200 209  BMI 34.05 33.28 34.78   Physical Exam Vitals reviewed.  Constitutional:      Appearance: Normal appearance. She is obese.  Neck:     Vascular: No carotid bruit.  Cardiovascular:     Rate and Rhythm: Normal rate and regular rhythm.     Heart sounds: Normal heart sounds.  Pulmonary:     Effort: Pulmonary effort is normal.     Breath sounds: Normal breath sounds.  Abdominal:     General: Bowel sounds are normal.     Palpations: Abdomen is soft.     Tenderness: There is no abdominal tenderness.  Musculoskeletal:        General: Tenderness (lumbar paraspinal muscles. negative SLRs.) present.  Neurological:     Mental Status: She is alert and oriented to person, place, and time.  Psychiatric:        Mood and Affect: Mood normal.        Behavior: Behavior normal.      Diabetic Foot Exam - Simple   No data filed       Lab Results  Component Value Date   WBC 5.5  08/06/2019   HGB 13.8 08/06/2019   HCT 43.1 08/06/2019   PLT  226 08/06/2019   GLUCOSE 96 08/06/2019   CHOL 265 (H) 08/06/2019   TRIG 82 08/06/2019   HDL 43 08/06/2019   LDLCALC 208 (H) 08/06/2019   ALT 17 08/06/2019   AST 18 08/06/2019   NA 145 (H) 08/06/2019   K 4.7 08/06/2019   CL 107 (H) 08/06/2019   CREATININE 1.02 (H) 08/06/2019   BUN 11 08/06/2019   CO2 24 08/06/2019   INR 0.94 12/22/2014      Assessment & Plan:  1. Familial hyperlipidemia Recommend continue zetia and start on repatha.  Intolerant to statins - increased liver enzymes. - Lipid panel - Comprehensive metabolic panel - TSH  2. Atherosclerosis of aorta ([place])  Continue zetia. Recommend aspirin 81 mg daily.  Recommend repatha.   3. 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 Class 1 obesity due to excess calories with serious comorbidity and body mass index (BMI) of 33.0 to 33.9 in adult  5. Hypomagnesemia - Magnesium  6. GERD without esophagitis The current medical regimen is effective;  continue present plan and medications.  7. Lumbar strain Education -exercises.  Tylenol Ice.   My nursing staff have aided in the documentation of this note on the behalf of Rochel Brome, MD,as directed by  Rochel Brome, MD and thoroughly reviewed by Rochel Brome, MD.  Follow-up: Return in about 3 months (around 02/07/2020) for fasting.  AVS was given to patient prior to departure.  Rochel Brome, MD [facility] [phone]

## 2019-11-07 ENCOUNTER — Other Ambulatory Visit: Payer: Self-pay

## 2019-11-07 ENCOUNTER — Ambulatory Visit (INDEPENDENT_AMBULATORY_CARE_PROVIDER_SITE_OTHER): Payer: 59 | Admitting: Family Medicine

## 2019-11-07 VITALS — BP 122/78 | HR 80 | Temp 97.4°F | Resp 14 | Ht 65.0 in | Wt 204.6 lb

## 2019-11-07 DIAGNOSIS — I700 Atherosclerosis of aorta: Secondary | ICD-10-CM | POA: Diagnosis not present

## 2019-11-07 DIAGNOSIS — E6609 Other obesity due to excess calories: Secondary | ICD-10-CM | POA: Diagnosis not present

## 2019-11-07 DIAGNOSIS — E7849 Other hyperlipidemia: Secondary | ICD-10-CM

## 2019-11-07 DIAGNOSIS — C772 Secondary and unspecified malignant neoplasm of intra-abdominal lymph nodes: Secondary | ICD-10-CM

## 2019-11-07 DIAGNOSIS — Z6833 Body mass index (BMI) 33.0-33.9, adult: Secondary | ICD-10-CM

## 2019-11-07 DIAGNOSIS — K219 Gastro-esophageal reflux disease without esophagitis: Secondary | ICD-10-CM

## 2019-11-07 DIAGNOSIS — S39012A Strain of muscle, fascia and tendon of lower back, initial encounter: Secondary | ICD-10-CM

## 2019-11-07 DIAGNOSIS — E782 Mixed hyperlipidemia: Secondary | ICD-10-CM

## 2019-11-07 NOTE — Patient Instructions (Signed)

## 2019-11-08 LAB — COMPREHENSIVE METABOLIC PANEL
ALT: 18 IU/L (ref 0–32)
AST: 20 IU/L (ref 0–40)
Albumin/Globulin Ratio: 1.8 (ref 1.2–2.2)
Albumin: 4.4 g/dL (ref 3.8–4.8)
Alkaline Phosphatase: 117 IU/L (ref 44–121)
BUN/Creatinine Ratio: 9 (ref 9–23)
BUN: 10 mg/dL (ref 6–24)
Bilirubin Total: 0.4 mg/dL (ref 0.0–1.2)
CO2: 26 mmol/L (ref 20–29)
Calcium: 9.8 mg/dL (ref 8.7–10.2)
Chloride: 105 mmol/L (ref 96–106)
Creatinine, Ser: 1.1 mg/dL — ABNORMAL HIGH (ref 0.57–1.00)
GFR calc Af Amer: 68 mL/min/{1.73_m2} (ref >59)
GFR calc non Af Amer: 59 mL/min/{1.73_m2} — ABNORMAL LOW (ref >59)
Globulin, Total: 2.4 g/dL (ref 1.5–4.5)
Glucose: 95 mg/dL (ref 65–99)
Potassium: 4.4 mmol/L (ref 3.5–5.2)
Sodium: 145 mmol/L — ABNORMAL HIGH (ref 134–144)
Total Protein: 6.8 g/dL (ref 6.0–8.5)

## 2019-11-08 LAB — CBC WITH DIFFERENTIAL/PLATELET
Basophils Absolute: 0.1 10*3/uL (ref 0.0–0.2)
Basos: 1 %
EOS (ABSOLUTE): 0.5 10*3/uL — ABNORMAL HIGH (ref 0.0–0.4)
Eos: 7 %
Hematocrit: 41 % (ref 34.0–46.6)
Hemoglobin: 13.8 g/dL (ref 11.1–15.9)
Immature Grans (Abs): 0 10*3/uL (ref 0.0–0.1)
Immature Granulocytes: 0 %
Lymphocytes Absolute: 2.5 10*3/uL (ref 0.7–3.1)
Lymphs: 38 %
MCH: 27.7 pg (ref 26.6–33.0)
MCHC: 33.7 g/dL (ref 31.5–35.7)
MCV: 82 fL (ref 79–97)
Monocytes Absolute: 0.6 10*3/uL (ref 0.1–0.9)
Monocytes: 10 %
Neutrophils Absolute: 2.9 10*3/uL (ref 1.4–7.0)
Neutrophils: 44 %
Platelets: 253 10*3/uL (ref 150–450)
RBC: 4.99 x10E6/uL (ref 3.77–5.28)
RDW: 14 % (ref 11.7–15.4)
WBC: 6.5 10*3/uL (ref 3.4–10.8)

## 2019-11-08 LAB — LIPID PANEL
Chol/HDL Ratio: 4.8 ratio — ABNORMAL HIGH (ref 0.0–4.4)
Cholesterol, Total: 229 mg/dL — ABNORMAL HIGH (ref 100–199)
HDL: 48 mg/dL (ref >39)
LDL Chol Calc (NIH): 167 mg/dL — ABNORMAL HIGH (ref 0–99)
Triglycerides: 80 mg/dL (ref 0–149)
VLDL Cholesterol Cal: 14 mg/dL (ref 5–40)

## 2019-11-08 LAB — CARDIOVASCULAR RISK ASSESSMENT

## 2019-11-08 LAB — MAGNESIUM: Magnesium: 2 mg/dL (ref 1.6–2.3)

## 2019-11-08 LAB — TSH: TSH: 3.62 u[IU]/mL (ref 0.450–4.500)

## 2019-11-17 ENCOUNTER — Encounter: Payer: Self-pay | Admitting: Family Medicine

## 2019-11-17 MED ORDER — REPATHA SURECLICK 140 MG/ML ~~LOC~~ SOAJ: 140.0000 mg | SUBCUTANEOUS | 3 refills | Status: DC

## 2019-12-08 ENCOUNTER — Other Ambulatory Visit: Payer: Self-pay | Admitting: Family Medicine

## 2019-12-19 ENCOUNTER — Other Ambulatory Visit: Payer: Self-pay

## 2019-12-19 ENCOUNTER — Inpatient Hospital Stay: Payer: 59 | Attending: Obstetrics & Gynecology

## 2019-12-19 DIAGNOSIS — C189 Malignant neoplasm of colon, unspecified: Secondary | ICD-10-CM

## 2019-12-19 DIAGNOSIS — Z95828 Presence of other vascular implants and grafts: Secondary | ICD-10-CM

## 2019-12-19 DIAGNOSIS — C184 Malignant neoplasm of transverse colon: Secondary | ICD-10-CM | POA: Diagnosis present

## 2019-12-19 DIAGNOSIS — Z452 Encounter for adjustment and management of vascular access device: Secondary | ICD-10-CM | POA: Diagnosis present

## 2019-12-19 MED ORDER — SODIUM CHLORIDE 0.9% FLUSH
10.0000 mL | Freq: Once | INTRAVENOUS | Status: AC
  Administered 2019-12-19: 10:00:00 10 mL
  Filled 2019-12-19: qty 10

## 2019-12-19 MED ORDER — HEPARIN SOD (PORK) LOCK FLUSH 100 UNIT/ML IV SOLN
500.0000 [IU] | Freq: Once | INTRAVENOUS | Status: AC
  Administered 2019-12-19: 10:00:00 500 [IU]
  Filled 2019-12-19: qty 5

## 2019-12-19 NOTE — Patient Instructions (Signed)

## 2019-12-31 ENCOUNTER — Other Ambulatory Visit: Payer: Self-pay | Admitting: Family Medicine

## 2020-01-20 ENCOUNTER — Other Ambulatory Visit: Payer: Self-pay | Admitting: Physician Assistant

## 2020-02-06 NOTE — Progress Notes (Signed)
Subjective:  Patient ID: Brenda Becker, female    DOB: February 09, 1970  Age: 50 y.o. MRN: [id number]  Chief Complaint  Patient presents with  . Gastroesophageal Reflux  . Hyperlipidemia    HPI Hyperlipidemia - Zetia 10 mg once daily. Eating very healthy. Exercising. Water. No sodas. Has aortic atherosclerosis. GERD - Prilosec 40 mg once daily.  Weight mgmt: on phentermine. Doing well. Eating heatlhy. Drinking plenty of water.  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 Is currently not on chemotherapy as she has completed her last cycle of chemotherapy.   Current Outpatient Medications on File Prior to Visit  Medication Sig Dispense Refill  . ezetimibe (ZETIA) 10 MG tablet TAKE ONE TABLET BY MOUTH EVERY DAY 30 tablet 0  . hydrocortisone 2.5 % cream Apply topically.    Marland Kitchen omeprazole (PRILOSEC) 40 MG capsule Take 1 capsule (40 mg total) by mouth daily. 30 capsule 0  . phentermine (ADIPEX-P) 37.5 MG tablet TAKE ONE TABLET BY MOUTH EVERY MORNING 30 tablet 1   Current Facility-Administered Medications on File Prior to Visit  Medication Dose Route Frequency Provider Last Rate Last Admin  . sodium chloride flush (NS) 0.9 % injection 10 mL  10 mL Intravenous PRN Ladell Pier, MD   10 mL at 05/28/15 8119   Past Medical History:  Diagnosis Date  . Anxiety disorder   . Cancer [facility]) 2016   colon ca surgery and chemo  . Colon cancer ([place])   . Complication of anesthesia    N/V  . GERD (gastroesophageal reflux disease)   . Headache   . Hypomagnesemia   . Mixed hyperlipidemia   . PONV (postoperative nausea and vomiting)    vomit with Gallbladder surgery  . Toxic liver disease with hepatitis, not elsewhere classified    Past Surgical History:  Procedure Laterality Date  . BREAST BIOPSY    . CHOLECYSTECTOMY    . COLON SURGERY  2016   cancer  . EUS N/A 02/17/2016   Procedure: UPPER ENDOSCOPIC ULTRASOUND (EUS) RADIAL;  Surgeon: Milus Banister, MD;   [facility];  Service: Endoscopy;  Laterality: N/A;  . EUS N/A 07/20/2016   Procedure: UPPER ENDOSCOPIC ULTRASOUND (EUS) RADIAL;  Surgeon: Milus Banister, MD;  Location[facility];  Service: Endoscopy;  Laterality: N/A;  . KNEE ARTHROSCOPY Right   . TONSILLECTOMY  early 20's    Family History  Problem Relation Age of Onset  . Other Father        doesn't go to doctor or get colonoscopies  . Other Brother        stomach issues; has had early colonoscopy  . Other Maternal Aunt        won't go to doctor; probably has not had colonoscopy  . Cancer Cousin        "some kind of stomach cancer" dx. early 31s  . Skin cancer Maternal Grandmother        non-melanoma; dx. late 60s  . Prostate cancer Maternal Grandfather        dx. late 82s  . Stomach cancer Other        dx. "older"  . Colon cancer Other        dx. 26s  . Cancer Other        "female cancer" dx. "later age";   Marland Kitchen Breast cancer Other        dx. 50s-60s  . Breast cancer Paternal Grandmother  dx. 19s  . Cervical cancer Paternal Aunt 63       [facility] at [facility]  . Throat cancer Paternal Aunt 22       smoker   Social History   Socioeconomic History  . Marital status: Married    Spouse name: Not on file  . Number of children: 1  . Years of education: Not on file  . Highest education level: Not on file  Occupational History  . 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 Smoking status: Never Smoker  . Smokeless tobacco: Never Used  Vaping Use  . Vaping Use: Never used  Substance and Sexual Activity  . Alcohol use: No  . Drug use: No  . Sexual activity: Yes    Partners: Male    Comment: MARRIED- 37 YRS  Other Topics Concern  . Not on file  Social History Narrative  . Not on file   Social Determinants of Health   Financial Resource Strain: Not on file  Food Insecurity: Not on file  Transportation Needs: Not on file  Physical Activity: Not on file  Stress: Not on file   Social Connections: Not on file    Review of Systems  Constitutional: Negative for chills, fatigue and fever.  HENT: Negative for congestion, ear pain, rhinorrhea and sore throat.   Respiratory: Negative for cough and shortness of breath.   Cardiovascular: Negative for chest pain.  Gastrointestinal: Negative for abdominal pain, constipation, diarrhea, nausea and vomiting.  Genitourinary: Negative for dysuria and urgency.  Musculoskeletal: Negative for back pain and myalgias.  Neurological: Negative for dizziness, weakness, light-headedness and headaches.  Psychiatric/Behavioral: Negative for dysphoric mood. The patient is not nervous/anxious.      Objective:  BP 132/76   Pulse 88   Temp (!) 97.3 F (36.3 C)   Ht 5\' 5"  (1.651 m)   Wt 210 lb (95.3 kg)   SpO2 100%   BMI 34.95 kg/m   BP/Weight 02/10/2020 95/01/8839 06/07/628  Systolic BP 160 109 323  Diastolic BP 76 78 82  Wt. (Lbs) 210 204.6 200  BMI 34.95 34.05 33.28    Physical Exam Vitals reviewed.  Constitutional:      Appearance: Normal appearance.  Neck:     Vascular: No carotid bruit.  Cardiovascular:     Rate and Rhythm: Normal rate and regular rhythm.     Heart sounds: Normal heart sounds.  Pulmonary:     Effort: Pulmonary effort is normal.     Breath sounds: Normal breath sounds.  Abdominal:     General: Bowel sounds are normal.     Tenderness: There is no abdominal tenderness.  Neurological:     Mental Status: She is alert and oriented to person, place, and time.  Psychiatric:        Mood and Affect: Mood normal.        Behavior: Behavior normal.     Diabetic Foot Exam - Simple   No data filed      Lab Results  Component Value Date   WBC 6.5 11/07/2019   HGB 13.8 11/07/2019   HCT 41.0 11/07/2019   PLT 253 11/07/2019   GLUCOSE 95 11/07/2019   CHOL 229 (H) 11/07/2019   TRIG 80 11/07/2019   HDL 48 11/07/2019   LDLCALC 167 (H) 11/07/2019   ALT 18 11/07/2019   AST 20 11/07/2019   NA 145 (H)  11/07/2019   K 4.4 11/07/2019   CL 105 11/07/2019  CREATININE 1.10 (H) 11/07/2019   BUN 10 11/07/2019   CO2 26 11/07/2019   TSH 3.620 11/07/2019   INR 0.94 12/22/2014      Assessment & Plan:   1. Familial hyperlipidemia Await labs to determine recommendations Continue to work on eating a healthy diet and exercise.  Labs drawn today.  - Lipid panel  2. GERD without esophagitis The current medical regimen is effective;  continue present plan and medications. - CBC with Differential/Platelet - Comprehensive metabolic panel  3. Class 1 obesity due to excess calories with serious comorbidity and body mass index (BMI) of 34.0 to 34.9 in adult Recommend continue to work on eating healthy diet and exercise.  4. Metastatic cancer to intra-abdominal lymph nodes ([place]) Recommend continue to follow up with oncology.   5. Atherosclerosis of aorta ([place])  Intolerant to statins. Elevated LFTs, but was also on chemotherapy at the time.  Trial on crestor 10 mg once daily at night. Recheck CMP and Lipid panel in 6 weeks.  Orders Placed This Encounter  Procedures  . CBC with Differential/Platelet  . Lipid panel  . Comprehensive metabolic panel     Follow-up: Return in about 3 months (around 05/09/2020) for fasting.  An After Visit Summary was printed and given to the patient.  Rochel Brome, MD [facility] [phone]

## 2020-02-10 ENCOUNTER — Ambulatory Visit: Payer: 59 | Admitting: Family Medicine

## 2020-02-10 ENCOUNTER — Encounter: Payer: Self-pay | Admitting: Family Medicine

## 2020-02-10 VITALS — BP 132/76 | HR 88 | Temp 97.3°F | Ht 65.0 in | Wt 210.0 lb

## 2020-02-10 DIAGNOSIS — Z6834 Body mass index (BMI) 34.0-34.9, adult: Secondary | ICD-10-CM

## 2020-02-10 DIAGNOSIS — E6609 Other obesity due to excess calories: Secondary | ICD-10-CM | POA: Diagnosis not present

## 2020-02-10 DIAGNOSIS — K219 Gastro-esophageal reflux disease without esophagitis: Secondary | ICD-10-CM

## 2020-02-10 DIAGNOSIS — E782 Mixed hyperlipidemia: Secondary | ICD-10-CM

## 2020-02-10 DIAGNOSIS — C772 Secondary and unspecified malignant neoplasm of intra-abdominal lymph nodes: Secondary | ICD-10-CM | POA: Diagnosis not present

## 2020-02-10 DIAGNOSIS — I700 Atherosclerosis of aorta: Secondary | ICD-10-CM

## 2020-02-10 DIAGNOSIS — E7849 Other hyperlipidemia: Secondary | ICD-10-CM

## 2020-02-11 LAB — CBC WITH DIFFERENTIAL/PLATELET
Basophils Absolute: 0.1 10*3/uL (ref 0.0–0.2)
Basos: 1 %
EOS (ABSOLUTE): 0.3 10*3/uL (ref 0.0–0.4)
Eos: 6 %
Hematocrit: 40.4 % (ref 34.0–46.6)
Hemoglobin: 13.4 g/dL (ref 11.1–15.9)
Immature Grans (Abs): 0 10*3/uL (ref 0.0–0.1)
Immature Granulocytes: 0 %
Lymphocytes Absolute: 2.4 10*3/uL (ref 0.7–3.1)
Lymphs: 40 %
MCH: 27.9 pg (ref 26.6–33.0)
MCHC: 33.2 g/dL (ref 31.5–35.7)
MCV: 84 fL (ref 79–97)
Monocytes Absolute: 0.6 10*3/uL (ref 0.1–0.9)
Monocytes: 10 %
Neutrophils Absolute: 2.7 10*3/uL (ref 1.4–7.0)
Neutrophils: 43 %
Platelets: 253 10*3/uL (ref 150–450)
RBC: 4.8 x10E6/uL (ref 3.77–5.28)
RDW: 13.5 % (ref 11.7–15.4)
WBC: 6.1 10*3/uL (ref 3.4–10.8)

## 2020-02-11 LAB — COMPREHENSIVE METABOLIC PANEL
ALT: 13 IU/L (ref 0–32)
AST: 17 IU/L (ref 0–40)
Albumin/Globulin Ratio: 1.7 (ref 1.2–2.2)
Albumin: 4.3 g/dL (ref 3.8–4.8)
Alkaline Phosphatase: 97 IU/L (ref 44–121)
BUN/Creatinine Ratio: 9 (ref 9–23)
BUN: 9 mg/dL (ref 6–24)
Bilirubin Total: 0.3 mg/dL (ref 0.0–1.2)
CO2: 22 mmol/L (ref 20–29)
Calcium: 9.4 mg/dL (ref 8.7–10.2)
Chloride: 105 mmol/L (ref 96–106)
Creatinine, Ser: 0.97 mg/dL (ref 0.57–1.00)
GFR calc Af Amer: 79 mL/min/{1.73_m2} (ref >59)
GFR calc non Af Amer: 69 mL/min/{1.73_m2} (ref >59)
Globulin, Total: 2.5 g/dL (ref 1.5–4.5)
Glucose: 93 mg/dL (ref 65–99)
Potassium: 4.9 mmol/L (ref 3.5–5.2)
Sodium: 144 mmol/L (ref 134–144)
Total Protein: 6.8 g/dL (ref 6.0–8.5)

## 2020-02-11 LAB — LIPID PANEL
Chol/HDL Ratio: 4.6 ratio — ABNORMAL HIGH (ref 0.0–4.4)
Cholesterol, Total: 227 mg/dL — ABNORMAL HIGH (ref 100–199)
HDL: 49 mg/dL (ref >39)
LDL Chol Calc (NIH): 161 mg/dL — ABNORMAL HIGH (ref 0–99)
Triglycerides: 95 mg/dL (ref 0–149)
VLDL Cholesterol Cal: 17 mg/dL (ref 5–40)

## 2020-02-11 LAB — CARDIOVASCULAR RISK ASSESSMENT

## 2020-02-13 ENCOUNTER — Other Ambulatory Visit: Payer: Self-pay

## 2020-02-13 DIAGNOSIS — E782 Mixed hyperlipidemia: Secondary | ICD-10-CM

## 2020-02-13 MED ORDER — ROSUVASTATIN CALCIUM 10 MG PO TABS: 10.0000 mg | ORAL_TABLET | Freq: Every day | ORAL | 0 refills | Status: DC

## 2020-02-23 ENCOUNTER — Other Ambulatory Visit: Payer: Self-pay | Admitting: Physician Assistant

## 2020-03-01 ENCOUNTER — Other Ambulatory Visit: Payer: Self-pay | Admitting: Physician Assistant

## 2020-03-05 MED ORDER — NORETHIN ACE-ETH ESTRAD-FE 1-20 MG-MCG PO TABS: 1.0000 | ORAL_TABLET | Freq: Every day | ORAL | 0 refills | Status: DC

## 2020-03-08 ENCOUNTER — Other Ambulatory Visit: Payer: Self-pay | Admitting: Family Medicine

## 2020-03-26 ENCOUNTER — Other Ambulatory Visit: Payer: Self-pay

## 2020-03-26 ENCOUNTER — Other Ambulatory Visit: Payer: 59

## 2020-03-26 DIAGNOSIS — E782 Mixed hyperlipidemia: Secondary | ICD-10-CM

## 2020-03-27 LAB — LIPID PANEL
Chol/HDL Ratio: 4.7 ratio — ABNORMAL HIGH (ref 0.0–4.4)
Cholesterol, Total: 173 mg/dL (ref 100–199)
HDL: 37 mg/dL — ABNORMAL LOW (ref >39)
LDL Chol Calc (NIH): 120 mg/dL — ABNORMAL HIGH (ref 0–99)
Triglycerides: 83 mg/dL (ref 0–149)
VLDL Cholesterol Cal: 16 mg/dL (ref 5–40)

## 2020-03-27 LAB — COMPREHENSIVE METABOLIC PANEL
ALT: 11 IU/L (ref 0–32)
AST: 13 IU/L (ref 0–40)
Albumin/Globulin Ratio: 1.6 (ref 1.2–2.2)
Albumin: 4.3 g/dL (ref 3.8–4.8)
Alkaline Phosphatase: 80 IU/L (ref 44–121)
BUN/Creatinine Ratio: 10 (ref 9–23)
BUN: 12 mg/dL (ref 6–24)
Bilirubin Total: 0.4 mg/dL (ref 0.0–1.2)
CO2: 20 mmol/L (ref 20–29)
Calcium: 9.5 mg/dL (ref 8.7–10.2)
Chloride: 105 mmol/L (ref 96–106)
Creatinine, Ser: 1.15 mg/dL — ABNORMAL HIGH (ref 0.57–1.00)
Globulin, Total: 2.7 g/dL (ref 1.5–4.5)
Glucose: 93 mg/dL (ref 65–99)
Potassium: 4.4 mmol/L (ref 3.5–5.2)
Sodium: 142 mmol/L (ref 134–144)
Total Protein: 7 g/dL (ref 6.0–8.5)
eGFR: 58 mL/min/{1.73_m2} — ABNORMAL LOW (ref >59)

## 2020-03-27 LAB — CARDIOVASCULAR RISK ASSESSMENT

## 2020-03-29 ENCOUNTER — Ambulatory Visit: Payer: 59 | Admitting: Obstetrics & Gynecology

## 2020-03-29 ENCOUNTER — Other Ambulatory Visit: Payer: Self-pay

## 2020-03-29 ENCOUNTER — Encounter: Payer: Self-pay | Admitting: Obstetrics & Gynecology

## 2020-03-29 ENCOUNTER — Telehealth: Payer: Self-pay | Admitting: *Deleted

## 2020-03-29 VITALS — BP 132/84

## 2020-03-29 DIAGNOSIS — N921 Excessive and frequent menstruation with irregular cycle: Secondary | ICD-10-CM

## 2020-03-29 DIAGNOSIS — C189 Malignant neoplasm of colon, unspecified: Secondary | ICD-10-CM

## 2020-03-29 MED ORDER — MEGESTROL ACETATE 40 MG PO TABS: 40.0000 mg | ORAL_TABLET | Freq: Two times a day (BID) | ORAL | 1 refills | Status: DC

## 2020-03-29 NOTE — Progress Notes (Signed)
    Brenda Becker Dec 29, 1970 [id number]        50 y.o.  G1P1L1 Married  RP: Prolonged vaginal bleeding x 02/27/2020  HPI: Prolonged vaginal bleeding x 02/27/2020.No pelvic pain.  Abstinent since 2020.Chemotherapy for colon Cancer x 4 years.   OB History  Gravida Para Term Preterm AB Living  1 1       1   SAB IAB Ectopic Multiple Live Births               # Outcome Date GA Lbr Len/2nd Weight Sex Delivery Anes PTL Lv  1 Para             Past medical history,surgical history, problem list, medications, allergies, family history and social history were all reviewed and documented in the EPIC chart.   Directed ROS with pertinent positives and negatives documented in the history of present illness/assessment and plan.  Exam:  Vitals:   03/29/20 1513  BP: 132/84   General appearance:  Normal  Abdomen: Normal  Gynecologic exam: Vulva normal.  Speculum:  Cevix/Vagina normal.  Mild dark blood.  Bimanual exam:  AV uterus, normal volume, mobile, NT.  No adnexal mass, NT bilaterally.   Assessment/Plan:  50 y.o. G1P1   1. Menometrorrhagia Prolonged heavy variable vaginal bleeding x 02/27/2020.  R/O anemia, thyroid dysfunction and perimenopause.  F/U Pelvic US for further investigation r/o Polyp/Fibroid/Endometrial Pathology including Endometrial Hyperplasia and Endometrial Ca.  Start on Megace 40 mg BID.  Usage reviewed and prescription sent to pharmacy. - US Transvaginal Non-OB; Future - TSH - FSH - CBC  2. Malignant neoplasm of colon, unspecified part of colon ([place]) Chemotherapy.  Other orders - megestrol (MEGACE) 40 MG tablet; Take 1 tablet (40 mg total) by mouth 2 (two) times daily.  [name] MD, 3:20 PM 03/29/2020

## 2020-03-29 NOTE — Telephone Encounter (Signed)
[facility] called stating the megace is out of stock and they can't get the medication from the supplier any time soon. I checked patient to see if Rx could be sent to another pharmacy and [facility] [address] [facility] be fine. I called the pharmacy to confirm they have Rx in stock and they do. I told patient they have Rx in stock and I would send Rx to pharmacy for her.

## 2020-04-01 ENCOUNTER — Encounter: Payer: Self-pay | Admitting: Obstetrics & Gynecology

## 2020-04-03 LAB — CBC
HCT: 39.6 % (ref 35.0–45.0)
Hemoglobin: 13.1 g/dL (ref 11.7–15.5)
MCH: 27 pg (ref 27.0–33.0)
MCHC: 33.1 g/dL (ref 32.0–36.0)
MCV: 81.6 fL (ref 80.0–100.0)
MPV: 11.7 fL (ref 7.5–12.5)
Platelets: 252 10*3/uL (ref 140–400)
RBC: 4.85 10*6/uL (ref 3.80–5.10)
RDW: 13.5 % (ref 11.0–15.0)
WBC: 12.5 10*3/uL — ABNORMAL HIGH (ref 3.8–10.8)

## 2020-04-03 LAB — TSH: TSH: 6.78 mIU/L — ABNORMAL HIGH

## 2020-04-03 LAB — FOLLICLE STIMULATING HORMONE: FSH: 1.4 m[IU]/mL — ABNORMAL LOW

## 2020-04-03 LAB — T4, FREE: Free T4: 1.2 ng/dL (ref 0.8–1.8)

## 2020-04-03 LAB — T3, FREE: T3, Free: 3 pg/mL (ref 2.3–4.2)

## 2020-04-05 ENCOUNTER — Other Ambulatory Visit: Payer: Self-pay | Admitting: Physician Assistant

## 2020-04-07 ENCOUNTER — Other Ambulatory Visit (HOSPITAL_COMMUNITY)
Admission: RE | Admit: 2020-04-07 | Discharge: 2020-04-07 | Disposition: A | Discharge status: Home / Self Care | Payer: 59 | Source: Ambulatory Visit | Attending: Obstetrics & Gynecology | Admitting: Obstetrics & Gynecology

## 2020-04-07 ENCOUNTER — Ambulatory Visit: Payer: 59 | Admitting: Obstetrics & Gynecology

## 2020-04-07 ENCOUNTER — Ambulatory Visit (INDEPENDENT_AMBULATORY_CARE_PROVIDER_SITE_OTHER): Payer: 59

## 2020-04-07 ENCOUNTER — Encounter: Payer: Self-pay | Admitting: Obstetrics & Gynecology

## 2020-04-07 ENCOUNTER — Other Ambulatory Visit: Payer: Self-pay

## 2020-04-07 VITALS — BP 140/86

## 2020-04-07 DIAGNOSIS — N921 Excessive and frequent menstruation with irregular cycle: Secondary | ICD-10-CM

## 2020-04-07 DIAGNOSIS — R9389 Abnormal findings on diagnostic imaging of other specified body structures: Secondary | ICD-10-CM | POA: Diagnosis not present

## 2020-04-07 NOTE — Progress Notes (Addendum)
    Brenda Becker 26-Sep-1970 [id number]        50 y.o.  G1P1   RP: Menometrorrhagia for Pelvic US  HPI: Prolonged vaginal bleeding x 02/27/2020.No pelvic pain. Abstinent since 2020.Chemotherapy for colon Cancerx 4 years. Started on Megace x 03/29/2020, less bleeding since then.   OB History  Gravida Para Term Preterm AB Living  1 1       1   SAB IAB Ectopic Multiple Live Births               # Outcome Date GA Lbr Len/2nd Weight Sex Delivery Anes PTL Lv  1 Para             Past medical history,surgical history, problem list, medications, allergies, family history and social history were all reviewed and documented in the EPIC chart.   Directed ROS with pertinent positives and negatives documented in the history of present illness/assessment and plan.  Exam:  Vitals:   04/07/20 1050  BP: 140/86   General appearance:  Normal  Pelvic US today: T/V images.  Normal uterine size and shape with no myometrial mass, the uterus is measured at 10.6 x 7.42 x 6.19 cm.  The endometrial lining is thickened at 3.4 cm and is hypervascular in areas with no definite mass seen.  The right ovary is normal (Cyst resolved).  The left ovary presents a 2.7 cm simple cyst which is decreased in size since January 2022 ultrasound.  Normal blood flow to both ovaries.  No adnexal mass seen.  No free fluid in the posterior cul-de-sac.   Endometrial Biopsy:  Verbal informed consent obtained.  Endometrial Bx procedure:  Vulva normal.  Speculum:  Cervix/Vagina normal.  Betadine prep done.  Hurricane spray.  Tenaculum on anterior lip of cervix.  Os finder.  Easy endometrial biopsy with a suction canula.  Specimen taken with suction on all intra uterine surfaces.  Good amount of endometrial tissue.  Specimen sent to pathology.  All instruments removed.  Good hemostasis.  Well tolerated.  No Cx.   Assessment/Plan:  50 y.o. G1P1   1. Menometrorrhagia Menometrorrhagia with thickened endometrial line on Pelvic  US without lesion seen.  Endometrial Bx performed successfully.  Pending patho.  Management per results.  Post procedure precautions reviewed.  Continue on Megace.  2. Thickened endometrium EBx done today, pending patho.  Will further investigate with a Sonohysto to r/o Endometrial Polyps.  [name] MD, 11:30 AM 04/07/2020

## 2020-04-09 LAB — SURGICAL PATHOLOGY

## 2020-04-13 ENCOUNTER — Encounter: Payer: Self-pay | Admitting: Obstetrics & Gynecology

## 2020-04-15 ENCOUNTER — Ambulatory Visit: Payer: 59

## 2020-04-20 NOTE — Progress Notes (Signed)
Subjective:  Patient ID: Brenda Becker, female    DOB: 1970-08-08  Age: 50 y.o. MRN: [id number]  Chief Complaint  Patient presents with  . Urinary Tract Infection   HPI  UTI- c/o of urgency, frequency and some dysuria x 2-3 weeks. Has been drinking cranberry juice and taking align.  Current Outpatient Medications on File Prior to Visit  Medication Sig Dispense Refill  . ezetimibe (ZETIA) 10 MG tablet TAKE ONE TABLET BY MOUTH EVERY DAY 30 tablet 0  . hydrocortisone 2.5 % cream Apply topically.    . megestrol (MEGACE) 40 MG tablet Take 1 tablet (40 mg total) by mouth 2 (two) times daily. 60 tablet 1   Current Facility-Administered Medications on File Prior to Visit  Medication Dose Route Frequency Provider Last Rate Last Admin  . sodium chloride flush (NS) 0.9 % injection 10 mL  10 mL Intravenous PRN Ladell Pier, MD   10 mL at 05/28/15 4585   Past Medical History:  Diagnosis Date  . Anxiety disorder   . Cancer [facility]) 2016   colon ca surgery and chemo  . Colon cancer ([place])   . Complication of anesthesia    N/V  . GERD (gastroesophageal reflux disease)   . Headache   . Hypomagnesemia   . Mixed hyperlipidemia   . PONV (postoperative nausea and vomiting)    vomit with Gallbladder surgery  . Toxic liver disease with hepatitis, not elsewhere classified    Past Surgical History:  Procedure Laterality Date  . BREAST BIOPSY    . CHOLECYSTECTOMY    . COLON SURGERY  2016   cancer  . EUS N/A 02/17/2016   Procedure: UPPER ENDOSCOPIC ULTRASOUND (EUS) RADIAL;  Surgeon: Milus Banister, MD;  [facility];  Service: Endoscopy;  Laterality: N/A;  . EUS N/A 07/20/2016   Procedure: UPPER ENDOSCOPIC ULTRASOUND (EUS) RADIAL;  Surgeon: Milus Banister, MD;  [facility];  Service: Endoscopy;  Laterality: N/A;  . KNEE ARTHROSCOPY Right   . TONSILLECTOMY  early 20's    Family History  Problem Relation Age of Onset  . Other Father        doesn't go to doctor or get  colonoscopies  . Other Brother        stomach issues; has had early colonoscopy  . Other Maternal Aunt        won't go to doctor; probably has not had colonoscopy  . Cancer Cousin        "some kind of stomach cancer" dx. early 50s  . Skin cancer Maternal Grandmother        non-melanoma; dx. late 60s  . Prostate cancer Maternal Grandfather        dx. late 55s  . Stomach cancer Other        dx. "older"  . Colon cancer Other        dx. 29s  . Cancer Other        "female cancer" dx. "later age";   Marland Kitchen Breast cancer Other        dx. 50s-60s  . Breast cancer Paternal Grandmother        dx. 83s  . Cervical cancer Paternal Aunt 24       treated at [facility]  . Throat cancer Paternal Aunt 42       smoker   Social History   Socioeconomic History  . Marital status: Married    Spouse name: Not on file  . Number of children: 1  .  Years of education: Not on file  . Highest education level: Not on file  Occupational History  . 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 Smoking status: Never Smoker  . Smokeless tobacco: Never Used  Vaping Use  . Vaping Use: Never used  Substance and Sexual Activity  . Alcohol use: No  . Drug use: No  . Sexual activity: Yes    Partners: Male    Birth control/protection: Pill    Comment: MARRIED- 90 YRS  Other Topics Concern  . Not on file  Social History Narrative  . Not on file   Social Determinants of Health   Financial Resource Strain: Not on file  Food Insecurity: Not on file  Transportation Needs: Not on file  Physical Activity: Not on file  Stress: Not on file  Social Connections: Not on file    Review of Systems  Constitutional: Negative for chills, fatigue and fever.  HENT: Negative for congestion, ear pain, rhinorrhea and sore throat.   Respiratory: Negative for cough and shortness of breath.   Cardiovascular: Negative for chest pain.  Gastrointestinal: Negative for abdominal pain, constipation, diarrhea,  nausea and vomiting.  Genitourinary: Positive for dysuria, frequency and urgency.  Musculoskeletal: Negative for back pain and myalgias.  Neurological: Positive for headaches. Negative for dizziness, weakness and light-headedness.  Psychiatric/Behavioral: Negative for dysphoric mood. The patient is not nervous/anxious.      Objective:  BP 130/82   Pulse 94   Temp (!) 97.4 F (36.3 C)   Ht 5\' 5"  (1.651 m)   Wt 208 lb (94.3 kg)   SpO2 100%   BMI 34.61 kg/m   BP/Weight 04/21/2020 04/07/2020 0/01/7492  Systolic BP 496 759 163  Diastolic BP 82 86 84  Wt. (Lbs) 208 - -  BMI 34.61 - -    Physical Exam Vitals reviewed.  Constitutional:      Appearance: Normal appearance. She is normal weight.  Neck:     Vascular: No carotid bruit.  Cardiovascular:     Rate and Rhythm: Normal rate and regular rhythm.     Pulses: Normal pulses.     Heart sounds: Normal heart sounds.  Pulmonary:     Effort: Pulmonary effort is normal. No respiratory distress.     Breath sounds: Normal breath sounds.  Abdominal:     General: Abdomen is flat. Bowel sounds are normal.     Palpations: Abdomen is soft.     Tenderness: There is no abdominal tenderness.  Neurological:     Mental Status: She is alert and oriented to person, place, and time.  Psychiatric:        Mood and Affect: Mood normal.        Behavior: Behavior normal.     Diabetic Foot Exam - Simple   No data filed      Lab Results  Component Value Date   WBC 7.6 04/21/2020   HGB 11.9 04/21/2020   HCT 36.7 04/21/2020   PLT 383 04/21/2020   GLUCOSE 94 04/21/2020   CHOL 173 03/26/2020   TRIG 83 03/26/2020   HDL 37 (L) 03/26/2020   LDLCALC 120 (H) 03/26/2020   ALT 18 04/21/2020   AST 12 04/21/2020   NA 144 04/21/2020   K 4.3 04/21/2020   CL 107 (H) 04/21/2020   CREATININE 1.22 (H) 04/21/2020   BUN 14 04/21/2020   CO2 22 04/21/2020   TSH 6.78 (H) 03/29/2020   INR 0.94 12/22/2014  Assessment & Plan:   1. Acute  cystitis with hematuria - POCT urinalysis dipstick - sulfamethoxazole-trimethoprim (BACTRIM DS) 800-160 MG tablet; Take 1 tablet by mouth 2 (two) times daily.  Dispense: 14 tablet; Refill: 0 - Urine Culture  2. Other elevated white blood cell (WBC) count - CBC with Differential/Platelet  3. Chronic kidney disease (CKD), stage II (mild) Continue healthy diet and exercise.  Labs drawn today - Comprehensive metabolic panel   I,Shakeita Vandevander,acting as a scribe for Rochel Brome, MD.,have documented all relevant documentation on the behalf of Rochel Brome, MD,as directed by  Rochel Brome, MD while in the presence of Rochel Brome, MD.  Meds ordered this encounter  Medications  . sulfamethoxazole-trimethoprim (BACTRIM DS) 800-160 MG tablet    Sig: Take 1 tablet by mouth 2 (two) times daily.    Dispense:  14 tablet    Refill:  0    Orders Placed This Encounter  Procedures  . Urine Culture  . CBC with Differential/Platelet  . Comprehensive metabolic panel  . POCT urinalysis dipstick     Follow-up: Return in about 6 weeks (around 06/04/2020).  An After Visit Summary was printed and given to the patient.  Rochel Brome, MD [facility] [phone]

## 2020-04-21 ENCOUNTER — Other Ambulatory Visit: Payer: Self-pay

## 2020-04-21 ENCOUNTER — Ambulatory Visit: Payer: 59

## 2020-04-21 ENCOUNTER — Encounter: Payer: Self-pay | Admitting: Family Medicine

## 2020-04-21 ENCOUNTER — Ambulatory Visit: Payer: 59 | Admitting: Family Medicine

## 2020-04-21 VITALS — BP 130/82 | HR 94 | Temp 97.4°F | Ht 65.0 in | Wt 208.0 lb

## 2020-04-21 DIAGNOSIS — N3001 Acute cystitis with hematuria: Secondary | ICD-10-CM | POA: Diagnosis not present

## 2020-04-21 DIAGNOSIS — D72828 Other elevated white blood cell count: Secondary | ICD-10-CM | POA: Diagnosis not present

## 2020-04-21 DIAGNOSIS — N182 Chronic kidney disease, stage 2 (mild): Secondary | ICD-10-CM

## 2020-04-21 LAB — POCT URINALYSIS DIPSTICK
Bilirubin, UA: NEGATIVE
Ketones, UA: NEGATIVE
Nitrite, UA: POSITIVE
Protein, UA: POSITIVE — AB
Spec Grav, UA: 1.02 (ref 1.010–1.025)
Urobilinogen, UA: 0.2 E.U./dL
pH, UA: 6 (ref 5.0–8.0)

## 2020-04-21 MED ORDER — SULFAMETHOXAZOLE-TRIMETHOPRIM 800-160 MG PO TABS: 1.0000 | ORAL_TABLET | Freq: Two times a day (BID) | ORAL | 0 refills | Status: DC

## 2020-04-22 LAB — CBC WITH DIFFERENTIAL/PLATELET
Basophils Absolute: 0.1 10*3/uL (ref 0.0–0.2)
Basos: 1 %
EOS (ABSOLUTE): 0.4 10*3/uL (ref 0.0–0.4)
Eos: 5 %
Hematocrit: 36.7 % (ref 34.0–46.6)
Hemoglobin: 11.9 g/dL (ref 11.1–15.9)
Immature Grans (Abs): 0 10*3/uL (ref 0.0–0.1)
Immature Granulocytes: 0 %
Lymphocytes Absolute: 2.5 10*3/uL (ref 0.7–3.1)
Lymphs: 33 %
MCH: 26.4 pg — ABNORMAL LOW (ref 26.6–33.0)
MCHC: 32.4 g/dL (ref 31.5–35.7)
MCV: 81 fL (ref 79–97)
Monocytes Absolute: 0.5 10*3/uL (ref 0.1–0.9)
Monocytes: 7 %
Neutrophils Absolute: 4.1 10*3/uL (ref 1.4–7.0)
Neutrophils: 54 %
Platelets: 383 10*3/uL (ref 150–450)
RBC: 4.51 x10E6/uL (ref 3.77–5.28)
RDW: 13.5 % (ref 11.7–15.4)
WBC: 7.6 10*3/uL (ref 3.4–10.8)

## 2020-04-22 LAB — COMPREHENSIVE METABOLIC PANEL
ALT: 18 IU/L (ref 0–32)
AST: 12 IU/L (ref 0–40)
Albumin/Globulin Ratio: 1.2 (ref 1.2–2.2)
Albumin: 4.1 g/dL (ref 3.8–4.8)
Alkaline Phosphatase: 88 IU/L (ref 44–121)
BUN/Creatinine Ratio: 11 (ref 9–23)
BUN: 14 mg/dL (ref 6–24)
Bilirubin Total: <0.2 mg/dL (ref 0.0–1.2)
CO2: 22 mmol/L (ref 20–29)
Calcium: 10 mg/dL (ref 8.7–10.2)
Chloride: 107 mmol/L — ABNORMAL HIGH (ref 96–106)
Creatinine, Ser: 1.22 mg/dL — ABNORMAL HIGH (ref 0.57–1.00)
Globulin, Total: 3.3 g/dL (ref 1.5–4.5)
Glucose: 94 mg/dL (ref 65–99)
Potassium: 4.3 mmol/L (ref 3.5–5.2)
Sodium: 144 mmol/L (ref 134–144)
Total Protein: 7.4 g/dL (ref 6.0–8.5)
eGFR: 54 mL/min/{1.73_m2} — ABNORMAL LOW (ref >59)

## 2020-04-23 LAB — URINE CULTURE

## 2020-04-25 ENCOUNTER — Other Ambulatory Visit: Payer: Self-pay | Admitting: Family Medicine

## 2020-04-25 MED ORDER — NITROFURANTOIN MONOHYD MACRO 100 MG PO CAPS: 100.0000 mg | ORAL_CAPSULE | Freq: Two times a day (BID) | ORAL | 0 refills | Status: DC

## 2020-04-29 ENCOUNTER — Other Ambulatory Visit: Payer: 59 | Admitting: Obstetrics & Gynecology

## 2020-04-29 ENCOUNTER — Other Ambulatory Visit: Payer: 59

## 2020-05-03 ENCOUNTER — Other Ambulatory Visit: Payer: Self-pay | Admitting: Physician Assistant

## 2020-05-04 ENCOUNTER — Other Ambulatory Visit: Payer: Self-pay | Admitting: Family Medicine

## 2020-05-07 ENCOUNTER — Other Ambulatory Visit: Payer: Self-pay | Admitting: Obstetrics & Gynecology

## 2020-05-11 ENCOUNTER — Other Ambulatory Visit: Payer: Self-pay | Admitting: Obstetrics & Gynecology

## 2020-05-20 ENCOUNTER — Ambulatory Visit (INDEPENDENT_AMBULATORY_CARE_PROVIDER_SITE_OTHER): Payer: 59

## 2020-05-20 ENCOUNTER — Ambulatory Visit (INDEPENDENT_AMBULATORY_CARE_PROVIDER_SITE_OTHER): Payer: 59 | Admitting: Obstetrics & Gynecology

## 2020-05-20 ENCOUNTER — Other Ambulatory Visit: Payer: Self-pay | Admitting: *Deleted

## 2020-05-20 ENCOUNTER — Other Ambulatory Visit: Payer: Self-pay

## 2020-05-20 ENCOUNTER — Encounter: Payer: Self-pay | Admitting: Obstetrics & Gynecology

## 2020-05-20 DIAGNOSIS — N921 Excessive and frequent menstruation with irregular cycle: Secondary | ICD-10-CM

## 2020-05-20 DIAGNOSIS — N840 Polyp of corpus uteri: Secondary | ICD-10-CM

## 2020-05-20 NOTE — Progress Notes (Signed)
Brenda Becker 1970/05/01 [id number]        50 y.o.  G1P1   RP: Menometrorrhagia/thickened endometrium/Fibroids for Sonohysto  HPI: Prolonged vaginal bleeding x 02/27/2020.No pelvic pain. Abstinent since2020.Chemotherapy for colon Cancerx 4 years. Started on Megace x 03/29/2020, less bleeding since then.  EBx benign on 04/07/2020.   OB History  Gravida Para Term Preterm AB Living  1 1       1   SAB IAB Ectopic Multiple Live Births               # Outcome Date GA Lbr Len/2nd Weight Sex Delivery Anes PTL Lv  1 Para             Past medical history,surgical history, problem list, medications, allergies, family history and social history were all reviewed and documented in the EPIC chart.   Directed ROS with pertinent positives and negatives documented in the history of present illness/assessment and plan.  Exam:  There were no vitals filed for this visit. General appearance:  Normal                                                                    Sono Infusion Hysterogram ( procedure note)   The initial transvaginal ultrasound demonstrated the following:  T/V images.  Anteverted uterus enlarged with fibroids.  Intramural fibroid located at the fundus measures 3.7 x 3.1 cm.  A second fibroid at the left fundus measured at 2.4 x 2.5 cm shows cystic spaces compatible with degeneration.  The endometrial lining is thin and symmetrical measured at 4.14 mm.  The fibroids at the fundus obscured the view of the endometrial lining at that level.  Both ovaries are normal in size.  No adnexal mass.  No free fluid in the posterior cul-de-sac.  The speculum  was inserted and the cervix cleansed with Betadine solution after confirming that patient has no allergies.A small sonohysterography catheterwas utilized.  Insertion was facilitated with ring forceps, using a spear-like motion the catheter was inserted to the fundus of the uterus. The speculum is then removed carefully to avoid  dislodging the catheter. The catheter was flushed with sterile saline delete prior to insertion to rid it of small amounts of air.the sterile saline solution was infused into the uterine cavity as a vaginal ultrasound probe was then placed in the vagina for full visualization of the uterine cavity from a transvaginal approach. The following was noted:  Sono was still performed using 0.9% normal saline.  After normal saline was introduced into the endometrial cavity, a filling defect was noted measured at 9 mm and avascular.  The catheter was then removed after retrieving some of the saline from the intrauterine cavity. An endometrial biopsy was not done. Patient tolerated procedure well. She had received a tablet of Aleve for discomfort.   EBx done 04/07/2020: - Inactive endometrium with stromal progestational changes.  - No hyperplasia or malignancy.    Assessment/Plan:  50 y.o. G1P1   1. Menometrorrhagia Sonohysterogram findings reviewed with patient.  Procedure well-tolerated with no complications.  Postprocedure precautions reviewed.  2. Endometrial polyp Probable endometrial polyp measured at 9 mm.  Decision to proceed with a hysteroscopy with MyoSure excision of the polyp and D&C.  Preop preparation, surgical  procedure with risks of complications,  as well as postop precautions thoroughly reviewed with patient.  Patient's questions answered.  Patient declines a preop visit as it was completed today.  [name] MD, 8:58 PM 05/20/2020

## 2020-05-21 ENCOUNTER — Telehealth: Payer: Self-pay | Admitting: *Deleted

## 2020-05-21 NOTE — Telephone Encounter (Signed)
-----   Message from [name], MD sent at 05/20/2020 10:26 AM EDT ----- Regarding: Schedule surgery Surgery:  Hysteroscopy, Myosure excision, D+C  Diagnosis:  Endometrial Polyp  Location: [place]  Status: Outpatient  Time: 30 Minutes  Assistant: N/A  Urgency: First Available  Pre-Op Appointment: Completed  Post-Op Appointment(s): 2 Weeks/Combine with Annual gyn exam  Time Out Of Work: Day Of Surgery, 1 Day Post Op

## 2020-05-21 NOTE — Telephone Encounter (Signed)
Left message to call Dnyla Antonetti, RN at [facility], [phone].  

## 2020-05-23 ENCOUNTER — Other Ambulatory Visit: Payer: Self-pay | Admitting: Family Medicine

## 2020-05-24 NOTE — Telephone Encounter (Addendum)
Left voicemail requesting a return call to review benefits for  Brenda Becker  Prior auth processing with [employer] reference # [id number] Approved authorization [id number]. Prior auth scanned in system  Spoke with patient regarding surgery benefits. Patient acknowledges understanding of information presented. Patient is aware that benefits presented are professional benefits only. Patient is aware that once surgery is scheduled, the hospital will call with separate benefits. See account note.

## 2020-05-24 NOTE — Telephone Encounter (Signed)
Surgery request sent.  

## 2020-05-24 NOTE — Telephone Encounter (Signed)
Spoke with patient. Patient request to proceed with surgery on 06/16/20.  Advised patient I will return call once surgery date and time confirmed. Patient agreeable.   Routing to [employer]

## 2020-05-27 ENCOUNTER — Ambulatory Visit: Payer: 59 | Admitting: Obstetrics & Gynecology

## 2020-06-02 ENCOUNTER — Other Ambulatory Visit: Payer: Self-pay | Admitting: Obstetrics & Gynecology

## 2020-06-02 DIAGNOSIS — Z1231 Encounter for screening mammogram for malignant neoplasm of breast: Secondary | ICD-10-CM

## 2020-06-02 NOTE — Telephone Encounter (Signed)
Spoke with patient. Surgery date request confirmed.  Advised surgery is scheduled for 06/16/20 at 1045, [facility].  Surgery instruction sheet and hospital brochure reviewed, printed copy will be mailed.  Patient advised if Covid requirements and agreeable.   Routing to provider. Encounter closed.   Cc: KimAlexis

## 2020-06-03 NOTE — Progress Notes (Deleted)
Subjective:  Patient ID: Brenda Becker, female    DOB: June 03, 1970  Age: 50 y.o. MRN: [id number]  Chief Complaint  Patient presents with  . Hyperlipidemia  . Gastroesophageal Reflux    HPI GERD without esophagitis   Mixed hyperlipidemia See updated med list  Chronic kidney disease (CKD), stage II (mild)    Current Outpatient Medications on File Prior to Visit  Medication Sig Dispense Refill  . ezetimibe (ZETIA) 10 MG tablet TAKE 1 TABLET BY MOUTH  DAILY 90 tablet 3  . hydrocortisone 2.5 % cream Apply topically.    . megestrol (MEGACE) 40 MG tablet TAKE 1 TABLET BY MOUTH TWICE A DAY 60 tablet 1  . nitrofurantoin, macrocrystal-monohydrate, (MACROBID) 100 MG capsule Take 1 capsule (100 mg total) by mouth 2 (two) times daily. 14 capsule 0  . omeprazole (PRILOSEC) 40 MG capsule Take 1 capsule (40 mg total) by mouth daily. 30 capsule 0  . phentermine (ADIPEX-P) 37.5 MG tablet TAKE ONE TABLET BY MOUTH EVERY MORNING 30 tablet 1  . sulfamethoxazole-trimethoprim (BACTRIM DS) 800-160 MG tablet Take 1 tablet by mouth 2 (two) times daily. 14 tablet 0   Current Facility-Administered Medications on File Prior to Visit  Medication Dose Route Frequency Provider Last Rate Last Admin  . sodium chloride flush (NS) 0.9 % injection 10 mL  10 mL Intravenous PRN Ladell Pier, MD   10 mL at 05/28/15 2876   Past Medical History:  Diagnosis Date  . Anxiety disorder   . Cancer [facility]) 2016   colon ca surgery and chemo  . Colon cancer ([place])   . Complication of anesthesia    N/V  . GERD (gastroesophageal reflux disease)   . Headache   . Hypomagnesemia   . Mixed hyperlipidemia   . PONV (postoperative nausea and vomiting)    vomit with Gallbladder surgery  . Toxic liver disease with hepatitis, not elsewhere classified    Past Surgical History:  Procedure Laterality Date  . BREAST BIOPSY    . CHOLECYSTECTOMY    . COLON SURGERY  2016   cancer  . EUS N/A 02/17/2016   Procedure: UPPER  ENDOSCOPIC ULTRASOUND (EUS) RADIAL;  Surgeon: Milus Banister, MD;  Location: [facility];  Service: Endoscopy;  Laterality: N/A;  . EUS N/A 07/20/2016   Procedure: UPPER ENDOSCOPIC ULTRASOUND (EUS) RADIAL;  Surgeon: Milus Banister, MD;  Location: [facility];  Service: Endoscopy;  Laterality: N/A;  . KNEE ARTHROSCOPY Right   . TONSILLECTOMY  early 20's    Family History  Problem Relation Age of Onset  . Other Father        doesn't go to doctor or get colonoscopies  . Other Brother        stomach issues; has had early colonoscopy  . Other Maternal Aunt        won't go to doctor; probably has not had colonoscopy  . Cancer Cousin        "some kind of stomach cancer" dx. early 95s  . Skin cancer Maternal Grandmother        non-melanoma; dx. late 60s  . Prostate cancer Maternal Grandfather        dx. late 49s  . Stomach cancer Other        dx. "older"  . Colon cancer Other        dx. 86s  . Cancer Other        "female cancer" dx. "later age";   Marland Kitchen Breast cancer Other  dx. 50s-60s  . Breast cancer Paternal Grandmother        dx. 18s  . Cervical cancer Paternal Aunt 58       [facility]  . Throat cancer Paternal Aunt 63       smoker   Social History   Socioeconomic History  . Marital status: Married    Spouse name: Not on file  . Number of children: 1  . Years of education: Not on file  . Highest education level: Not on file  Occupational History  . 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 Smoking status: Never Smoker  . Smokeless tobacco: Never Used  Vaping Use  . Vaping Use: Never used  Substance and Sexual Activity  . Alcohol use: No  . Drug use: No  . Sexual activity: Yes    Partners: Male    Birth control/protection: Pill    Comment: MARRIED- 13 YRS  Other Topics Concern  . Not on file  Social History Narrative  . Not on file   Social Determinants of Health   Financial Resource Strain: Not on file  Food Insecurity:  Not on file  Transportation Needs: Not on file  Physical Activity: Not on file  Stress: Not on file  Social Connections: Not on file    Review of Systems  Constitutional: Negative for chills, fatigue and fever.  HENT: Negative for congestion, ear pain, rhinorrhea and sore throat.   Respiratory: Negative for cough and shortness of breath.   Cardiovascular: Negative for chest pain.  Gastrointestinal: Negative for abdominal pain, constipation, diarrhea, nausea and vomiting.  Genitourinary: Negative for dysuria and urgency.  Musculoskeletal: Negative for back pain and myalgias.  Neurological: Negative for dizziness, weakness, light-headedness and headaches.  Psychiatric/Behavioral: Negative for dysphoric mood. The patient is not nervous/anxious.      Objective:  There were no vitals taken for this visit.  BP/Weight 04/21/2020 04/07/2020 5/39/7673  Systolic BP 419 379 024  Diastolic BP 82 86 84  Wt. (Lbs) 208 - -  BMI 34.61 - -    Physical Exam Vitals reviewed.  Constitutional:      Appearance: Normal appearance. She is normal weight.  Neck:     Vascular: No carotid bruit.  Cardiovascular:     Rate and Rhythm: Normal rate and regular rhythm.     Pulses: Normal pulses.     Heart sounds: Normal heart sounds.  Pulmonary:     Effort: Pulmonary effort is normal. No respiratory distress.     Breath sounds: Normal breath sounds.  Abdominal:     General: Abdomen is flat. Bowel sounds are normal.     Palpations: Abdomen is soft.     Tenderness: There is no abdominal tenderness.  Neurological:     Mental Status: She is alert and oriented to person, place, and time.  Psychiatric:        Mood and Affect: Mood normal.        Behavior: Behavior normal.     Diabetic Foot Exam - Simple   No data filed      Lab Results  Component Value Date   WBC 7.6 04/21/2020   HGB 11.9 04/21/2020   HCT 36.7 04/21/2020   PLT 383 04/21/2020   GLUCOSE 94 04/21/2020   CHOL 173 03/26/2020    TRIG 83 03/26/2020   HDL 37 (L) 03/26/2020   LDLCALC 120 (H) 03/26/2020   ALT 18 04/21/2020   AST 12 04/21/2020  NA 144 04/21/2020   K 4.3 04/21/2020   CL 107 (H) 04/21/2020   CREATININE 1.22 (H) 04/21/2020   BUN 14 04/21/2020   CO2 22 04/21/2020   TSH 6.78 (H) 03/29/2020   INR 0.94 12/22/2014      Assessment & Plan:   1. GERD without esophagitis  2. Mixed hyperlipidemia  3. Chronic kidney disease (CKD), stage II (mild)    No orders of the defined types were placed in this encounter.   No orders of the defined types were placed in this encounter.    Follow-up: No follow-ups on file.  An After Visit Summary was printed and given to the patient.  Rochel Brome, MD [facility] [phone]

## 2020-06-04 ENCOUNTER — Ambulatory Visit (INDEPENDENT_AMBULATORY_CARE_PROVIDER_SITE_OTHER): Payer: 59

## 2020-06-04 ENCOUNTER — Other Ambulatory Visit: Payer: Self-pay

## 2020-06-04 ENCOUNTER — Ambulatory Visit (INDEPENDENT_AMBULATORY_CARE_PROVIDER_SITE_OTHER): Payer: 59 | Admitting: Family Medicine

## 2020-06-04 VITALS — BP 138/78 | HR 72 | Temp 97.4°F | Resp 16 | Ht 65.0 in | Wt 208.0 lb

## 2020-06-04 DIAGNOSIS — C772 Secondary and unspecified malignant neoplasm of intra-abdominal lymph nodes: Secondary | ICD-10-CM

## 2020-06-04 DIAGNOSIS — N182 Chronic kidney disease, stage 2 (mild): Secondary | ICD-10-CM | POA: Diagnosis not present

## 2020-06-04 DIAGNOSIS — E7801 Familial hypercholesterolemia: Secondary | ICD-10-CM | POA: Diagnosis not present

## 2020-06-04 DIAGNOSIS — K219 Gastro-esophageal reflux disease without esophagitis: Secondary | ICD-10-CM

## 2020-06-04 DIAGNOSIS — C189 Malignant neoplasm of colon, unspecified: Secondary | ICD-10-CM

## 2020-06-04 DIAGNOSIS — G620 Drug-induced polyneuropathy: Secondary | ICD-10-CM

## 2020-06-04 DIAGNOSIS — Z23 Encounter for immunization: Secondary | ICD-10-CM

## 2020-06-04 DIAGNOSIS — Z6834 Body mass index (BMI) 34.0-34.9, adult: Secondary | ICD-10-CM

## 2020-06-04 DIAGNOSIS — E782 Mixed hyperlipidemia: Secondary | ICD-10-CM

## 2020-06-04 MED ORDER — PHENTERMINE HCL 15 MG PO CAPS: 15.0000 mg | ORAL_CAPSULE | ORAL | 5 refills | Status: DC

## 2020-06-04 NOTE — Progress Notes (Signed)
Subjective:  Patient ID: Brenda Becker, female    DOB: 07/03/1970  Age: 50 y.o. MRN: [id number]  Chief Complaint  Patient presents with   Hyperlipidemia   Gastroesophageal Reflux    HPI GERD without esophagitis Taking omeprazole 40 mg daily. This helps reflux.   Mixed hyperlipidemia -  Takes zetia 10 daily. Intolerant to statins in the past. Increase liver enzymes. Tries to eat healthy. Exercises. On phentermine.    Chronic kidney disease (CKD), stage 3a (mild) - stable.   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 Pt receives care from [place] [facility]. She is not currently on treatment. She developed chemotherapy induced polyneuropathy.     Current Outpatient Medications on File Prior to Visit  Medication Sig Dispense Refill   ezetimibe (ZETIA) 10 MG tablet TAKE 1 TABLET BY MOUTH  DAILY 90 tablet 3   hydrocortisone 2.5 % cream Apply topically.     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 [place] Cage [facility])   Mixed hyperlipidemia    PONV (postoperative nausea and  vomiting)    vomit with Gallbladder surgery   Past Surgical History:  Procedure Laterality Date   BREAST BIOPSY     COLON SURGERY  11/16/2014   '@[facility]'$ ---  partial transverse colectomy w/ node dissection   EUS N/A 02/17/2016   Procedure: UPPER ENDOSCOPIC ULTRASOUND (EUS) RADIAL;  Surgeon: Milus Banister, MD;  Location[facility];  Service: Endoscopy;  Laterality: N/A;   EUS N/A 07/20/2016   Procedure: UPPER ENDOSCOPIC ULTRASOUND (EUS) RADIAL;  Surgeon: Milus Banister, MD;  Location[facility];  Service: Endoscopy;  Laterality: N/A;   KNEE ARTHROSCOPY Right    LAPAROSCOPIC CHOLECYSTECTOMY  2003   PORTA CATH INSERTION  12/22/2014   TONSILLECTOMY  1994    Family History  Problem Relation Age of Onset   Other Father        doesn't go to doctor or get colonoscopies   Other Brother        stomach issues; has had early colonoscopy   Other Maternal Aunt        won't go to doctor; probably has not had colonoscopy   Cancer Cousin        "some kind of stomach cancer" dx. early 69s   Skin cancer Maternal Grandmother        non-melanoma; dx. late 60s   Prostate cancer Maternal Grandfather        dx. late 70s   Stomach cancer  Other        dx. "older"   Colon cancer Other        dx. 80s   Cancer Other        "female cancer" dx. "later age";    Breast cancer Other        dx. 3s-60s   Breast cancer Paternal Grandmother        dx. 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  Tobacco Use   Smoking status: Never   Smokeless tobacco: Never  Vaping Use   Vaping Use: Never used  Substance and Sexual Activity   Alcohol use: No   Drug use: No    Sexual activity: Yes    Partners: Male    Birth control/protection: Pill    Comment: MARRIED- 23 YRS  Other Topics Concern   Not on file  Social History Narrative   Not on file   Social Determinants of Health   Financial Resource Strain: Not on file  Food Insecurity: Not on file  Transportation Needs: Not on file  Physical Activity: Not on file  Stress: Not on file  Social Connections: Not on file    Review of Systems  Constitutional:  Negative for chills, fatigue and fever.  HENT:  Negative for congestion, rhinorrhea and sore throat.   Respiratory:  Negative for cough and shortness of breath.   Cardiovascular:  Negative for chest pain.  Gastrointestinal:  Negative for abdominal pain, constipation, diarrhea, nausea and vomiting.  Genitourinary:  Negative for dysuria and urgency.  Musculoskeletal:  Negative for back pain and myalgias.  Neurological:  Negative for dizziness, weakness, light-headedness and headaches.  Psychiatric/Behavioral:  Negative for dysphoric mood. The patient is not nervous/anxious.     Objective:  BP 138/78   Pulse 72   Temp (!) 97.4 F (36.3 C)   Resp 16   Ht $R'5\' 5"'tA$  (1.651 m)   Wt 208 lb (94.3 kg)   BMI 34.61 kg/m   BP/Weight 06/04/2020 4/94/4967 05/10/1636  Systolic BP 466 599 357  Diastolic BP 78 82 86  Wt. (Lbs) 208 208 -  BMI 34.61 34.61 -    Physical Exam Vitals reviewed.  Constitutional:      Appearance: Normal appearance. She is obese.  Neck:     Vascular: No carotid bruit.  Cardiovascular:     Rate and Rhythm: Normal rate and regular rhythm.     Pulses: Normal pulses.     Heart sounds: Normal heart sounds.  Pulmonary:     Effort: Pulmonary effort is normal. No respiratory distress.     Breath sounds: Normal breath sounds.  Abdominal:     General: Abdomen is flat. Bowel sounds are normal.     Palpations: Abdomen is soft.     Tenderness: There is no abdominal tenderness.  Neurological:     Mental Status: She is alert and  oriented to person, place, and time.  Psychiatric:        Mood and Affect: Mood normal.  Behavior: Behavior normal.    Diabetic Foot Exam - Simple   No data filed      Lab Results  Component Value Date   WBC 7.6 06/04/2020   HGB 13.0 06/04/2020   HCT 40.0 06/04/2020   PLT 261 06/04/2020   GLUCOSE 94 06/04/2020   CHOL 220 (H) 06/04/2020   TRIG 95 06/04/2020   HDL 35 (L) 06/04/2020   LDLCALC 168 (H) 06/04/2020   ALT 17 06/04/2020   AST 15 06/04/2020   NA 142 06/04/2020   K 4.4 06/04/2020   CL 106 06/04/2020   CREATININE 1.26 (H) 06/04/2020   BUN 13 06/04/2020   CO2 22 06/04/2020   TSH 6.78 (H) 03/29/2020   INR 0.94 12/22/2014      Assessment & Plan:   1. GERD without esophagitis The current medical regimen is effective;  continue present plan and medications.  2. Familial hypercholesterolemia Await labs to make recommendations.  Recommend continue to work on eating healthy diet and exercise. - Lipid panel  3. Chronic kidney disease (CKD), stage II (mild) Stable. Avoid nsaids - CBC with Differential/Platelet - Comprehensive metabolic panel  4. Need for Tdap vaccination - Tdap vaccine greater than or equal to 7yo IM  5. Colon cancer metastasized to intra-abdominal lymph node ([place]) Management per specialist.  Follows with oncology.  6. Drug-induced polyneuropathy ([place])  No treatment at this time.   7. Obesity. Bmi 34.  Continue phentermine 15 mg once daily maintenance, Recommend continue to work on eating healthy diet and exercise.   Meds ordered this encounter  Medications   phentermine 15 MG capsule    Sig: Take 1 capsule (15 mg total) by mouth every morning.    Dispense:  30 capsule    Refill:  5    Orders Placed This Encounter  Procedures   Tdap vaccine greater than or equal to 7yo IM   CBC with Differential/Platelet   Comprehensive metabolic panel   Lipid panel   Cardiovascular Risk Assessment     Follow-up: Return in about 3  months (around 09/04/2020) for fasting.  An After Visit Summary was printed and given to the patient.  Rochel Brome, MD [facility] [phone]

## 2020-06-05 ENCOUNTER — Other Ambulatory Visit: Payer: Self-pay | Admitting: Physician Assistant

## 2020-06-05 ENCOUNTER — Encounter: Payer: Self-pay | Admitting: Oncology

## 2020-06-05 LAB — COMPREHENSIVE METABOLIC PANEL
ALT: 17 IU/L (ref 0–32)
AST: 15 IU/L (ref 0–40)
Albumin/Globulin Ratio: 1.7 (ref 1.2–2.2)
Albumin: 4.7 g/dL (ref 3.8–4.8)
Alkaline Phosphatase: 72 IU/L (ref 44–121)
BUN/Creatinine Ratio: 10 (ref 9–23)
BUN: 13 mg/dL (ref 6–24)
Bilirubin Total: 0.4 mg/dL (ref 0.0–1.2)
CO2: 22 mmol/L (ref 20–29)
Calcium: 10 mg/dL (ref 8.7–10.2)
Chloride: 106 mmol/L (ref 96–106)
Creatinine, Ser: 1.26 mg/dL — ABNORMAL HIGH (ref 0.57–1.00)
Globulin, Total: 2.7 g/dL (ref 1.5–4.5)
Glucose: 94 mg/dL (ref 65–99)
Potassium: 4.4 mmol/L (ref 3.5–5.2)
Sodium: 142 mmol/L (ref 134–144)
Total Protein: 7.4 g/dL (ref 6.0–8.5)
eGFR: 52 mL/min/{1.73_m2} — ABNORMAL LOW (ref >59)

## 2020-06-05 LAB — CBC WITH DIFFERENTIAL/PLATELET
Basophils Absolute: 0.1 10*3/uL (ref 0.0–0.2)
Basos: 1 %
EOS (ABSOLUTE): 0.3 10*3/uL (ref 0.0–0.4)
Eos: 4 %
Hematocrit: 40 % (ref 34.0–46.6)
Hemoglobin: 13 g/dL (ref 11.1–15.9)
Immature Grans (Abs): 0 10*3/uL (ref 0.0–0.1)
Immature Granulocytes: 0 %
Lymphocytes Absolute: 2.9 10*3/uL (ref 0.7–3.1)
Lymphs: 38 %
MCH: 26.5 pg — ABNORMAL LOW (ref 26.6–33.0)
MCHC: 32.5 g/dL (ref 31.5–35.7)
MCV: 82 fL (ref 79–97)
Monocytes Absolute: 0.6 10*3/uL (ref 0.1–0.9)
Monocytes: 7 %
Neutrophils Absolute: 3.8 10*3/uL (ref 1.4–7.0)
Neutrophils: 50 %
Platelets: 261 10*3/uL (ref 150–450)
RBC: 4.9 x10E6/uL (ref 3.77–5.28)
RDW: 14.6 % (ref 11.7–15.4)
WBC: 7.6 10*3/uL (ref 3.4–10.8)

## 2020-06-05 LAB — LIPID PANEL
Chol/HDL Ratio: 6.3 ratio — ABNORMAL HIGH (ref 0.0–4.4)
Cholesterol, Total: 220 mg/dL — ABNORMAL HIGH (ref 100–199)
HDL: 35 mg/dL — ABNORMAL LOW (ref >39)
LDL Chol Calc (NIH): 168 mg/dL — ABNORMAL HIGH (ref 0–99)
Triglycerides: 95 mg/dL (ref 0–149)
VLDL Cholesterol Cal: 17 mg/dL (ref 5–40)

## 2020-06-05 LAB — CARDIOVASCULAR RISK ASSESSMENT

## 2020-06-11 ENCOUNTER — Encounter (HOSPITAL_BASED_OUTPATIENT_CLINIC_OR_DEPARTMENT_OTHER): Payer: Self-pay | Admitting: Obstetrics & Gynecology

## 2020-06-13 ENCOUNTER — Encounter: Payer: Self-pay | Admitting: Family Medicine

## 2020-06-14 ENCOUNTER — Encounter (HOSPITAL_BASED_OUTPATIENT_CLINIC_OR_DEPARTMENT_OTHER): Payer: Self-pay | Admitting: Obstetrics & Gynecology

## 2020-06-14 ENCOUNTER — Other Ambulatory Visit: Payer: Self-pay

## 2020-06-14 NOTE — Progress Notes (Signed)
Spoke w/ via phone for pre-op interview--- Pt Lab needs dos----  CBC, Urine preg, t&s             Lab results------ pt had CBCdiff/ CMP done 06-04-2020 results in epic COVID test -----patient states asymptomatic no test needed  Arrive at -------  0830 on 06-16-2020 NPO after MN NO Solid Food.  Clear liquids from MN until--- 0730 Med rec completed Medications to take morning of surgery ----- Zetia, Prilosec, Megace Diabetic medication ----- n/a  Patient instructed no nail polish to be worn day of surgery Patient instructed to bring photo id and insurance card day of surgery Patient aware to have Driver (ride ) / caregiver for 24 hours after surgery -- daughter, Jezabella Schriever (age 50)  Patient Special Instructions ----- n/a Pre-Op special Istructions ----- n/a Patient verbalized understanding of instructions that were given at this phone interview. Patient denies shortness of breath, chest pain, fever, cough at this phone interview.    Anesthesia :  Colon cancer dx 2016 s/p partial transverse colectomy, started chemo 12/ 2016 until 10/ 2021;  CKD2;  per pt residual from chemo right arm/ fingers neuropathy  Oncologist:  Dr Ethlyn Daniels @ [facility] 04-09-2020 epic/  and [name] 03-25-2019 epic Chest/ abd/ pelvis CT:  04-09-2020 care everywhere Last lab work 06-04-2020 in care everywhere

## 2020-06-16 ENCOUNTER — Ambulatory Visit (HOSPITAL_BASED_OUTPATIENT_CLINIC_OR_DEPARTMENT_OTHER): Payer: 59 | Admitting: Anesthesiology

## 2020-06-16 ENCOUNTER — Encounter (HOSPITAL_BASED_OUTPATIENT_CLINIC_OR_DEPARTMENT_OTHER): Payer: Self-pay | Admitting: Obstetrics & Gynecology

## 2020-06-16 ENCOUNTER — Ambulatory Visit (HOSPITAL_BASED_OUTPATIENT_CLINIC_OR_DEPARTMENT_OTHER)
Admission: RE | Admit: 2020-06-16 | Discharge: 2020-06-16 | Disposition: A | Discharge status: Home / Self Care | Payer: 59 | Attending: Obstetrics & Gynecology | Admitting: Obstetrics & Gynecology

## 2020-06-16 ENCOUNTER — Encounter (HOSPITAL_BASED_OUTPATIENT_CLINIC_OR_DEPARTMENT_OTHER)
Admission: RE | Disposition: A | Discharge status: Home / Self Care | Payer: Self-pay | Source: Home / Self Care | Attending: Obstetrics & Gynecology

## 2020-06-16 DIAGNOSIS — T451X5A Adverse effect of antineoplastic and immunosuppressive drugs, initial encounter: Secondary | ICD-10-CM | POA: Insufficient documentation

## 2020-06-16 DIAGNOSIS — E782 Mixed hyperlipidemia: Secondary | ICD-10-CM | POA: Insufficient documentation

## 2020-06-16 DIAGNOSIS — Z8042 Family history of malignant neoplasm of prostate: Secondary | ICD-10-CM | POA: Insufficient documentation

## 2020-06-16 DIAGNOSIS — Z79899 Other long term (current) drug therapy: Secondary | ICD-10-CM | POA: Insufficient documentation

## 2020-06-16 DIAGNOSIS — Z809 Family history of malignant neoplasm, unspecified: Secondary | ICD-10-CM | POA: Insufficient documentation

## 2020-06-16 DIAGNOSIS — Z803 Family history of malignant neoplasm of breast: Secondary | ICD-10-CM | POA: Insufficient documentation

## 2020-06-16 DIAGNOSIS — Z808 Family history of malignant neoplasm of other organs or systems: Secondary | ICD-10-CM | POA: Insufficient documentation

## 2020-06-16 DIAGNOSIS — Z888 Allergy status to other drugs, medicaments and biological substances status: Secondary | ICD-10-CM | POA: Insufficient documentation

## 2020-06-16 DIAGNOSIS — N840 Polyp of corpus uteri: Secondary | ICD-10-CM | POA: Insufficient documentation

## 2020-06-16 DIAGNOSIS — N182 Chronic kidney disease, stage 2 (mild): Secondary | ICD-10-CM | POA: Insufficient documentation

## 2020-06-16 DIAGNOSIS — Z85038 Personal history of other malignant neoplasm of large intestine: Secondary | ICD-10-CM | POA: Insufficient documentation

## 2020-06-16 DIAGNOSIS — Z8619 Personal history of other infectious and parasitic diseases: Secondary | ICD-10-CM | POA: Insufficient documentation

## 2020-06-16 DIAGNOSIS — Z8049 Family history of malignant neoplasm of other genital organs: Secondary | ICD-10-CM | POA: Diagnosis not present

## 2020-06-16 DIAGNOSIS — Z800 Family history of malignant neoplasm of digestive organs: Secondary | ICD-10-CM | POA: Diagnosis not present

## 2020-06-16 DIAGNOSIS — G620 Drug-induced polyneuropathy: Secondary | ICD-10-CM | POA: Insufficient documentation

## 2020-06-16 HISTORY — DX: Abnormal uterine and vaginal bleeding, unspecified: N93.9

## 2020-06-16 HISTORY — DX: Drug-induced polyneuropathy: G62.0

## 2020-06-16 HISTORY — PX: DILATATION & CURETTAGE/HYSTEROSCOPY WITH MYOSURE: SHX6511

## 2020-06-16 HISTORY — DX: Drug-induced polyneuropathy: T45.1X5A

## 2020-06-16 HISTORY — DX: Polyp of corpus uteri: N84.0

## 2020-06-16 HISTORY — DX: Presence of spectacles and contact lenses: Z97.3

## 2020-06-16 HISTORY — DX: Chronic kidney disease, stage 2 (mild): N18.2

## 2020-06-16 HISTORY — DX: Generalized anxiety disorder: F41.1

## 2020-06-16 LAB — TYPE AND SCREEN
ABO/RH(D): A POS
Antibody Screen: NEGATIVE

## 2020-06-16 LAB — CBC
HCT: 35.4 % — ABNORMAL LOW (ref 36.0–46.0)
Hemoglobin: 11.4 g/dL — ABNORMAL LOW (ref 12.0–15.0)
MCH: 26.4 pg (ref 26.0–34.0)
MCHC: 32.2 g/dL (ref 30.0–36.0)
MCV: 81.9 fL (ref 80.0–100.0)
Platelets: 307 10*3/uL (ref 150–400)
RBC: 4.32 MIL/uL (ref 3.87–5.11)
RDW: 14.3 % (ref 11.5–15.5)
WBC: 6.6 10*3/uL (ref 4.0–10.5)
nRBC: 0 % (ref 0.0–0.2)

## 2020-06-16 LAB — ABO/RH: ABO/RH(D): A POS

## 2020-06-16 LAB — POCT PREGNANCY, URINE: Preg Test, Ur: NEGATIVE

## 2020-06-16 SURGERY — DILATATION & CURETTAGE/HYSTEROSCOPY WITH MYOSURE
Anesthesia: General | Site: Vagina

## 2020-06-16 MED ORDER — PROPOFOL 10 MG/ML IV BOLUS
INTRAVENOUS | Status: DC | PRN
  Administered 2020-06-16: 150 mg via INTRAVENOUS

## 2020-06-16 MED ORDER — MIDAZOLAM HCL 5 MG/5ML IJ SOLN
INTRAMUSCULAR | Status: DC | PRN
  Administered 2020-06-16: 2 mg via INTRAVENOUS

## 2020-06-16 MED ORDER — CEFAZOLIN SODIUM-DEXTROSE 2-4 GM/100ML-% IV SOLN
INTRAVENOUS | Status: AC
  Filled 2020-06-16: qty 100

## 2020-06-16 MED ORDER — LIDOCAINE HCL (PF) 2 % IJ SOLN
INTRAMUSCULAR | Status: AC
  Filled 2020-06-16: qty 5

## 2020-06-16 MED ORDER — ACETAMINOPHEN 500 MG PO TABS
1000.0000 mg | ORAL_TABLET | Freq: Once | ORAL | Status: AC
  Administered 2020-06-16: 1000 mg via ORAL

## 2020-06-16 MED ORDER — LIDOCAINE HCL 1 % IJ SOLN
INTRAMUSCULAR | Status: DC | PRN
  Administered 2020-06-16: 10 mL

## 2020-06-16 MED ORDER — SCOPOLAMINE 1 MG/3DAYS TD PT72
1.0000 | MEDICATED_PATCH | Freq: Once | TRANSDERMAL | Status: DC
  Administered 2020-06-16: 1.5 mg via TRANSDERMAL

## 2020-06-16 MED ORDER — PROPOFOL 10 MG/ML IV BOLUS
INTRAVENOUS | Status: AC
  Filled 2020-06-16: qty 20

## 2020-06-16 MED ORDER — FENTANYL CITRATE (PF) 100 MCG/2ML IJ SOLN
INTRAMUSCULAR | Status: AC
  Filled 2020-06-16: qty 2

## 2020-06-16 MED ORDER — FENTANYL CITRATE (PF) 100 MCG/2ML IJ SOLN: 25.0000 ug | INTRAMUSCULAR | Status: DC | PRN

## 2020-06-16 MED ORDER — FENTANYL CITRATE (PF) 100 MCG/2ML IJ SOLN
INTRAMUSCULAR | Status: DC | PRN
  Administered 2020-06-16 (×3): 50 ug via INTRAVENOUS

## 2020-06-16 MED ORDER — MIDAZOLAM HCL 2 MG/2ML IJ SOLN
INTRAMUSCULAR | Status: AC
  Filled 2020-06-16: qty 2

## 2020-06-16 MED ORDER — DEXAMETHASONE SODIUM PHOSPHATE 10 MG/ML IJ SOLN
INTRAMUSCULAR | Status: DC | PRN
  Administered 2020-06-16: 10 mg via INTRAVENOUS

## 2020-06-16 MED ORDER — CEFAZOLIN SODIUM-DEXTROSE 2-4 GM/100ML-% IV SOLN
2.0000 g | INTRAVENOUS | Status: AC
  Administered 2020-06-16: 2 g via INTRAVENOUS

## 2020-06-16 MED ORDER — SODIUM CHLORIDE 0.9 % IV SOLN: INTRAVENOUS | Status: DC

## 2020-06-16 MED ORDER — SCOPOLAMINE 1 MG/3DAYS TD PT72
MEDICATED_PATCH | TRANSDERMAL | Status: AC
  Filled 2020-06-16: qty 1

## 2020-06-16 MED ORDER — LACTATED RINGERS IV SOLN: INTRAVENOUS | Status: DC

## 2020-06-16 MED ORDER — ACETAMINOPHEN 500 MG PO TABS
ORAL_TABLET | ORAL | Status: AC
  Filled 2020-06-16: qty 2

## 2020-06-16 MED ORDER — POVIDONE-IODINE 10 % EX SWAB: 2.0000 "application " | Freq: Once | CUTANEOUS | Status: DC

## 2020-06-16 MED ORDER — SODIUM CHLORIDE 0.9 % IR SOLN
Status: DC | PRN
  Administered 2020-06-16: 3000 mL

## 2020-06-16 MED ORDER — KETOROLAC TROMETHAMINE 30 MG/ML IJ SOLN
INTRAMUSCULAR | Status: DC | PRN
  Administered 2020-06-16: 30 mg via INTRAVENOUS

## 2020-06-16 MED ORDER — ONDANSETRON HCL 4 MG/2ML IJ SOLN
INTRAMUSCULAR | Status: DC | PRN
  Administered 2020-06-16: 4 mg via INTRAVENOUS

## 2020-06-16 MED ORDER — KETOROLAC TROMETHAMINE 30 MG/ML IJ SOLN
INTRAMUSCULAR | Status: AC
  Filled 2020-06-16: qty 1

## 2020-06-16 MED ORDER — LIDOCAINE 2% (20 MG/ML) 5 ML SYRINGE
INTRAMUSCULAR | Status: DC | PRN
  Administered 2020-06-16: 80 mg via INTRAVENOUS

## 2020-06-16 MED ORDER — ONDANSETRON HCL 4 MG/2ML IJ SOLN
INTRAMUSCULAR | Status: AC
  Filled 2020-06-16: qty 2

## 2020-06-16 MED ORDER — DEXAMETHASONE SODIUM PHOSPHATE 10 MG/ML IJ SOLN
INTRAMUSCULAR | Status: AC
  Filled 2020-06-16: qty 1

## 2020-06-16 SURGICAL SUPPLY — 24 items
CATH ROBINSON RED A/P 16FR (CATHETERS) ×3 IMPLANT
COVER WAND RF STERILE (DRAPES) ×3 IMPLANT
DEVICE MYOSURE LITE (MISCELLANEOUS) IMPLANT
DEVICE MYOSURE REACH (MISCELLANEOUS) ×3 IMPLANT
DILATOR CANAL MILEX (MISCELLANEOUS) IMPLANT
ELECT REM PT RETURN 9FT ADLT (ELECTROSURGICAL)
ELECTRODE REM PT RTRN 9FT ADLT (ELECTROSURGICAL) IMPLANT
GAUZE 4X4 16PLY RFD (DISPOSABLE) ×3 IMPLANT
GLOVE SURG ENC MOIS LTX SZ6.5 (GLOVE) ×3 IMPLANT
GLOVE SURG UNDER POLY LF SZ6.5 (GLOVE) ×6 IMPLANT
GLOVE SURG UNDER POLY LF SZ7 (GLOVE) ×6 IMPLANT
GLOVE SURG UNDER POLY LF SZ7.5 (GLOVE) ×3 IMPLANT
GOWN STRL REUS W/ TWL LRG LVL3 (GOWN DISPOSABLE) ×1 IMPLANT
GOWN STRL REUS W/TWL LRG LVL3 (GOWN DISPOSABLE) ×9 IMPLANT
IV NS IRRIG 3000ML ARTHROMATIC (IV SOLUTION) ×3 IMPLANT
KIT PROCEDURE FLUENT (KITS) ×3 IMPLANT
KIT TURNOVER CYSTO (KITS) ×3 IMPLANT
MYOSURE XL FIBROID (MISCELLANEOUS)
PACK VAGINAL MINOR WOMEN LF (CUSTOM PROCEDURE TRAY) ×3 IMPLANT
PAD OB MATERNITY 4.3X12.25 (PERSONAL CARE ITEMS) ×3 IMPLANT
PAD PREP 24X48 CUFFED NSTRL (MISCELLANEOUS) ×3 IMPLANT
SEAL CERVICAL OMNI LOK (ABLATOR) IMPLANT
SEAL ROD LENS SCOPE MYOSURE (ABLATOR) ×3 IMPLANT
SYSTEM TISS REMOVAL MYOSURE XL (MISCELLANEOUS) IMPLANT

## 2020-06-16 NOTE — H&P (Signed)
Brenda Becker is an 50 y.o. female G8P1L1 Married   RP: Menometrorrhagia/Endometrial Polyp for [facility] Myosure Excision, D+C   HPI: No change x last visit.  Prolonged vaginal bleeding x 02/27/2020. No pelvic pain.  Abstinent since 2020. Chemotherapy for colon Cancer x 4 years.  Started on Megace x 03/29/2020, less bleeding since then.  EBx benign on 04/07/2020.    Pertinent Gynecological History: Last mammogram: normal  Last pap: normal  OB History: G1, P1   Menstrual History: No LMP recorded. (Menstrual status: Chemotherapy).    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 [place] [facility])   Mixed hyperlipidemia    Peripheral neuropathy due to chemotherapy ([place])    06-14-2020  per pt right arm to fingers   PONV (postoperative nausea and vomiting)    vomit with Gallbladder surgery   Wears glasses     Past Surgical History:  Procedure Laterality Date   BREAST BIOPSY Right 2019   per pt benign   COLON SURGERY  11/16/2014   '@[facility]'$ ---  partial transverse colectomy w/ node dissection   EUS N/A 02/17/2016   Procedure: UPPER ENDOSCOPIC ULTRASOUND (EUS) RADIAL;  Surgeon: Milus Banister, MD;  Location: [facility] ENDOSCOPY;  Service: Endoscopy;  Laterality: N/A;   EUS N/A 07/20/2016   Procedure: UPPER ENDOSCOPIC ULTRASOUND (EUS) RADIAL;  Surgeon: Milus Banister, MD;  Location: [facility] ENDOSCOPY;  Service: Endoscopy;  Laterality: N/A;   KNEE  ARTHROSCOPY Right 2012   LAPAROSCOPIC CHOLECYSTECTOMY  2003   PORTA CATH INSERTION  12/22/2014   TONSILLECTOMY  1994    Family History  Problem Relation Age of Onset   Other Father        doesn't go to doctor or get colonoscopies   Other Brother        stomach issues; has had early colonoscopy   Other Maternal Aunt        won't go to doctor; probably has not had colonoscopy   Cancer Cousin        "some kind of stomach cancer" dx. early 24s   Skin cancer Maternal Grandmother        non-melanoma; dx. late 60s   Prostate cancer Maternal Grandfather        dx. late 66s   Stomach cancer Other        dx. "older"   Colon cancer Other        dx. 38s   Cancer Other        "female cancer" dx. "later age";    Breast cancer Other        dx. 88s-60s   Breast cancer Paternal Grandmother        dx. 72s   Cervical cancer Paternal Aunt 63       treated at [place] cancer Paternal Aunt 57       smoker    Social History:  reports that she has never smoked. She has never used smokeless tobacco. She reports that she does not drink alcohol and does not use drugs.  Allergies:  Allergies  Allergen Reactions   Lipitor [Atorvastatin Calcium]     Elevated LFTs     Medications Prior to Admission  Medication Sig Dispense Refill Last Dose   Coenzyme Q10 (COQ10 GUMMIES ADULT PO) Take 200 mg by mouth at bedtime.   06/13/2020   ezetimibe (ZETIA) 10 MG tablet TAKE 1 TABLET BY MOUTH  DAILY (Patient taking differently: Take 10 mg by mouth daily.) 90 tablet 3 06/16/2020 at 0630   megestrol (MEGACE) 40 MG tablet TAKE 1 TABLET BY MOUTH TWICE A DAY (Patient taking differently: Take 40 mg by mouth 2 (two) times daily.) 60 tablet 1 06/16/2020 at 0630   Omega-3 Fatty Acids (FISH OIL PO) Take 1,080 mg by mouth at bedtime.   06/13/2020   omeprazole (PRILOSEC) 40 MG capsule Take 1 capsule (40 mg total) by mouth daily. (Patient taking differently: Take 40 mg by mouth daily.) 30 capsule 0 06/16/2020 at 0630    Red Yeast Rice Extract (RED YEAST RICE PO) Take 2 tablets by mouth daily.   06/13/2020   Turmeric 500 MG TABS Take 2 tablets by mouth daily.   06/13/2020   hydrocortisone 2.5 % cream Apply topically as needed.   More than a month   phentermine 15 MG capsule Take 1 capsule (15 mg total) by mouth every morning. 30 capsule 5 06/12/2020    REVIEW OF SYSTEMS: A ROS was performed and pertinent positives and negatives are included in the history.  GENERAL: No fevers or chills. HEENT: No change in vision, no earache, sore throat or sinus congestion. NECK: No pain or stiffness. CARDIOVASCULAR: No chest pain or pressure. No palpitations. PULMONARY: No shortness of breath, cough or wheeze. GASTROINTESTINAL: No abdominal pain, nausea, vomiting or diarrhea, melena or bright red blood per rectum. GENITOURINARY: No urinary frequency, urgency, hesitancy or dysuria. MUSCULOSKELETAL: No joint or muscle pain, no back pain, no recent trauma. DERMATOLOGIC: No rash, no itching, no lesions. ENDOCRINE: No polyuria, polydipsia, no heat or cold intolerance. No recent change in weight. HEMATOLOGICAL: No anemia or easy bruising or bleeding. NEUROLOGIC: No headache, seizures, numbness, tingling or weakness. PSYCHIATRIC: No depression, no loss of interest in normal activity or change in sleep pattern.   Physical Exam:  Blood pressure (!) 151/95, pulse 85, temperature 98.8 F (37.1 C), temperature source Oral, resp. rate 15, height $RemoveBe'5\' 6"'XgqeanRsx$  (1.676 m), weight 94.5 kg, SpO2 100 %.   Results for orders placed or performed during the hospital encounter of 06/16/20 (from the past 24 hour(s))  Pregnancy, urine POC     Status: None   Collection Time: 06/16/20  8:40 AM  Result Value Ref Range   Preg Test, Ur NEGATIVE NEGATIVE                                                                       Sono Infusion Hysterogram ( procedure note)     The initial transvaginal ultrasound demonstrated the following:   T/V images.  Anteverted  uterus enlarged with fibroids.  Intramural fibroid located at the fundus measures 3.7 x 3.1 cm.  A second fibroid  at the left fundus measured at 2.4 x 2.5 cm shows cystic spaces compatible with degeneration.  The endometrial lining is thin and symmetrical measured at 4.14 mm.  The fibroids at the fundus obscured the view of the endometrial lining at that level.  Both ovaries are normal in size.  No adnexal mass.  No free fluid in the posterior cul-de-sac.   The speculum  was inserted and the cervix cleansed with Betadine solution after confirming that patient has no allergies.A small sonohysterography catheterwas utilized.  Insertion was facilitated with ring forceps, using a spear-like motion the catheter was inserted to the fundus of the uterus. The speculum is then removed carefully to avoid dislodging the catheter. The catheter was flushed with sterile saline delete prior to insertion to rid it of small amounts of air.the sterile saline solution was infused into the uterine cavity as a vaginal ultrasound probe was then placed in the vagina for full visualization of the uterine cavity from a transvaginal approach. The following was noted:   Sono was still performed using 0.9% normal saline.  After normal saline was introduced into the endometrial cavity, a filling defect was noted measured at 9 mm and avascular.   The catheter was then removed after retrieving some of the saline from the intrauterine cavity. An endometrial biopsy was not done. Patient tolerated procedure well. She had received a tablet of Aleve for discomfort.   EBx done 04/07/2020: - Inactive endometrium with stromal progestational changes.  - No hyperplasia or malignancy.     Assessment/Plan:  50 y.o. G1P1   1. Menometrorrhagia Sonohysterogram findings reviewed with patient.    2. Endometrial polyp Probable endometrial polyp measured at 9 mm.  Decision to proceed with a hysteroscopy with MyoSure excision of the polyp and D&C.   Preop preparation, surgical procedure with risks of complications,  as well as postop precautions thoroughly reviewed with patient.  Patient's questions answered.                          Patient was counseled as to the risk of surgery to include the following:  1. Infection (prohylactic antibiotics will be administered)  2. DVT/Pulmonary Embolism (prophylactic pneumo compression stockings will be used)  3.Trauma to internal organs requiring additional surgical procedure to repair any injury to internal organs requiring perhaps additional hospitalization days.  4.Hemmorhage requiring transfusion and blood products which carry risks such as anaphylactic reaction, hepatitis and AIDS  Patient had received literature information on the procedure scheduled and all her questions were answered and fully accepts all risk.    Marie-Lyne Daisy Mcneel 06/16/2020, 9:16 AM

## 2020-06-16 NOTE — Transfer of Care (Signed)
Immediate Anesthesia Transfer of Care Note  Patient: Brenda Becker  Procedure(s) Performed: DILATATION & CURETTAGE/HYSTEROSCOPY WITH MYOSURE EXCISION (Vagina )  Patient Location: PACU  Anesthesia Type:General  Level of Consciousness: awake, alert , oriented and patient cooperative  Airway & Oxygen Therapy: Pt with spont resp, connected to face mask O2  Post-op Assessment: Report given to RN and Post -op Vital signs reviewed and stable  Post vital signs: Reviewed and stable  Last Vitals:  Vitals Value Taken Time  BP 148/96 06/16/20 1053  Temp    Pulse 86 06/16/20 1055  Resp 16 06/16/20 1055  SpO2 100 % 06/16/20 1055  Vitals shown include unvalidated device data.  Last Pain:  Vitals:   06/16/20 0853  TempSrc: Oral  PainSc: 0-No pain      Patients Stated Pain Goal: 5 (60/10/93 2355)  Complications: No notable events documented.

## 2020-06-16 NOTE — Op Note (Signed)
Operative Note  06/16/2020  10:59 AM  PATIENT:  Brenda Becker  50 y.o. female  PRE-OPERATIVE DIAGNOSIS:  Endometrial polyp  POST-OPERATIVE DIAGNOSIS:  Endometrial polyp  PROCEDURE:  Procedure(s): DILATATION & CURETTAGE/HYSTEROSCOPY WITH MYOSURE EXCISION  SURGEON:  Surgeon(s): [name], MD  ANESTHESIA:   general  FINDINGS: Ostia visualized.  Polyp at posterior right uterus.  Endometrium mildly thickened posteriorly.  Partial septum, scar tissue or arcuate uterus.  DESCRIPTION OF OPERATION: Under general anesthesia with laryngeal mask, the patient is in lithotomy position.  She is prepped with Betadine on the suprapubic, vulvar and vaginal areas.  The bladder is catheterized. Timeout is done.  The patient is draped as usual.  The vaginal exam reveals an anteverted uterus, normal volume and mobile.  No adnexal mass.  The speculum is inserted in the vagina.  The anterior lip of the cervix was grasped with a tenaculum.  Dilation of the cervix with Pratt dilators up to #19 without difficulty.  Insertion of the hysteroscope.  Inspection of the intra uterine cavity.  A small polyp is present at the posterior aspect of the uterus on the right side.  Both ostia are well visualized.  The uterus is either arcuate or presents a partial septum at the fundus with some scarring.  The posterior aspect of the endometrium is slightly thickened. Pictures are taken. The Myosure reach instrument is inserted in the hysteroscope.  Excision of the endometrial polyp and the thickened endometrium posteriorly with the MyoSure instrument.  Pictures are taken after excisions.  Hemostasis is adequate.  The hysteroscope with MyoSure are removed.  We proceed with a systematic curettage of the endometrial cavity on all surfaces with a sharp curette.  Hemostasis is adequate.  All instruments are removed.  The patient is brought to recovery room in good and stable status.  ESTIMATED BLOOD LOSS: 5 mL FLUID DEFICIT:  280 mL  Intake/Output Summary (Last 24 hours) at 06/16/2020 1059 Last data filed at 06/16/2020 1042 Gross per 24 hour  Intake 800 ml  Output 5 ml  Net 795 ml     BLOOD ADMINISTERED:none   LOCAL MEDICATIONS USED:  LIDOCAINE 1% 10 cc for Paracervical block  SPECIMEN:  Source of Specimen:  Excision material and endometrial curettings  DISPOSITION OF SPECIMEN:  PATHOLOGY  COUNTS:  YES  PLAN OF CARE: Transfer to PACU  Marie-Lyne LavoieMD10:59 AM

## 2020-06-16 NOTE — Addendum Note (Signed)
Addendum  created 06/16/20 1544 by Suan Halter, CRNA   Charge Capture section accepted

## 2020-06-16 NOTE — Discharge Instructions (Signed)
  Post Anesthesia Home Care Instructions  Activity: Get plenty of rest for the remainder of the day. A responsible individual must stay with you for 24 hours following the procedure.  For the next 24 hours, DO NOT: -Drive a car -Paediatric nurse -Drink alcoholic beverages -Take any medication unless instructed by your physician -Make any legal decisions or sign important papers.  Meals: Start with liquid foods such as gelatin or soup. Progress to regular foods as tolerated. Avoid greasy, spicy, heavy foods. If nausea and/or vomiting occur, drink only clear liquids until the nausea and/or vomiting subsides. Call your physician if vomiting continues.  Special Instructions/Symptoms: Your throat may feel dry or sore from the anesthesia or the breathing tube placed in your throat during surgery. If this causes discomfort, gargle with warm salt water. The discomfort should disappear within 24 hours.  If you had a scopolamine patch placed behind your ear for the management of post- operative nausea and/or vomiting:  1. The medication in the patch is effective for 72 hours, after which it should be removed.  Wrap patch in a tissue and discard in the trash. Wash hands thoroughly with soap and water. 2. You may remove the patch earlier than 72 hours if you experience unpleasant side effects which may include dry mouth, dizziness or visual disturbances. 3. Avoid touching the patch. Wash your hands with soap and water after contact with the patch.      DISCHARGE INSTRUCTIONS: D&C / D&E The following instructions have been prepared to help you care for yourself upon your return home.   Personal hygiene:  Use sanitary pads for vaginal drainage, not tampons.  Shower the day after your procedure.  NO tub baths, pools or Jacuzzis for 2-3 weeks.  Wipe front to back after using the bathroom.  Activity and limitations:  Do NOT drive or operate any equipment for 24 hours. The effects of anesthesia are  still present and drowsiness may result.  Do NOT rest in bed all day.  Walking is encouraged.  Walk up and down stairs slowly.  You may resume your normal activity in one to two days or as indicated by your physician.  Sexual activity: NO intercourse for at least 2 weeks after the procedure, or as indicated by your physician.  Diet: Eat a light meal as desired this evening. You may resume your usual diet tomorrow.  Return to work: You may resume your work activities in one to two days or as indicated by your doctor.  What to expect after your surgery: Expect to have vaginal bleeding/discharge for 2-3 days and spotting for up to 10 days. It is not unusual to have soreness for up to 1-2 weeks. You may have a slight burning sensation when you urinate for the first day. Mild cramps may continue for a couple of days. You may have a regular period in 2-6 weeks.  Call your doctor for any of the following:  Excessive vaginal bleeding, saturating and changing one pad every hour.  Inability to urinate 6 hours after discharge from hospital.  Pain not relieved by pain medication.  Fever of 100.4 F or greater.  Unusual vaginal discharge or odor.    No ibuprofen, Advil, Aleve, Motrin, or naproxen until after 4:45 pm today if needed.   No Tylenol until after 3:30 pm today if needed.

## 2020-06-16 NOTE — Anesthesia Procedure Notes (Signed)
Procedure Name: LMA Insertion Date/Time: 06/16/2020 10:22 AM Performed by: Rogers Blocker, CRNA Pre-anesthesia Checklist: Patient identified, Emergency Drugs available, Suction available and Patient being monitored Patient Re-evaluated:Patient Re-evaluated prior to induction Oxygen Delivery Method: Circle System Utilized Preoxygenation: Pre-oxygenation with 100% oxygen Induction Type: IV induction Ventilation: Mask ventilation without difficulty LMA: LMA inserted LMA Size: 4.0 Number of attempts: 1 Placement Confirmation: positive ETCO2 Tube secured with: Tape Dental Injury: Teeth and Oropharynx as per pre-operative assessment

## 2020-06-16 NOTE — Anesthesia Postprocedure Evaluation (Signed)
Anesthesia Post Note  Patient: Brenda Becker  Procedure(s) Performed: DILATATION & CURETTAGE/HYSTEROSCOPY WITH MYOSURE EXCISION (Vagina )     Patient location during evaluation: PACU Anesthesia Type: General Level of consciousness: awake and alert Pain management: pain level controlled Vital Signs Assessment: post-procedure vital signs reviewed and stable Respiratory status: spontaneous breathing, nonlabored ventilation and respiratory function stable Cardiovascular status: blood pressure returned to baseline and stable Postop Assessment: no apparent nausea or vomiting Anesthetic complications: no   No notable events documented.  Last Vitals:  Vitals:   06/16/20 1126 06/16/20 1145  BP: (!) 146/92 (!) 148/91  Pulse: 94 68  Resp: 18 18  Temp:  36.7 C  SpO2: 98% 100%    Last Pain:  Vitals:   06/16/20 1145  TempSrc:   PainSc: 0-No pain                 Domanick Cuccia,W. EDMOND

## 2020-06-16 NOTE — Anesthesia Preprocedure Evaluation (Signed)
Anesthesia Evaluation  Patient identified by MRN, date of birth, ID band Patient awake    Reviewed: Allergy & Precautions, H&P , NPO status , Patient's Chart, lab work & pertinent test results  History of Anesthesia Complications (+) PONV  Airway Mallampati: I  TM Distance: >3 FB Neck ROM: Full    Dental no notable dental hx. (+) Teeth Intact, Dental Advisory Given   Pulmonary neg pulmonary ROS,    Pulmonary exam normal breath sounds clear to auscultation       Cardiovascular negative cardio ROS   Rhythm:Regular Rate:Normal     Neuro/Psych Anxiety negative neurological ROS     GI/Hepatic Neg liver ROS, GERD  Medicated,  Endo/Other  negative endocrine ROS  Renal/GU Renal disease  negative genitourinary   Musculoskeletal   Abdominal   Peds  Hematology negative hematology ROS (+)   Anesthesia Other Findings   Reproductive/Obstetrics negative OB ROS                             Anesthesia Physical Anesthesia Plan  ASA: 2  Anesthesia Plan: General   Post-op Pain Management:    Induction: Intravenous  PONV Risk Score and Plan: 4 or greater and Ondansetron, Dexamethasone, Midazolam and Scopolamine patch - Pre-op  Airway Management Planned: LMA  Additional Equipment:   Intra-op Plan:   Post-operative Plan: Extubation in OR  Informed Consent: I have reviewed the patients History and Physical, chart, labs and discussed the procedure including the risks, benefits and alternatives for the proposed anesthesia with the patient or authorized representative who has indicated his/her understanding and acceptance.     Dental advisory given  Plan Discussed with: CRNA  Anesthesia Plan Comments:         Anesthesia Quick Evaluation

## 2020-06-17 ENCOUNTER — Encounter (HOSPITAL_BASED_OUTPATIENT_CLINIC_OR_DEPARTMENT_OTHER): Payer: Self-pay | Admitting: Obstetrics & Gynecology

## 2020-06-17 LAB — SURGICAL PATHOLOGY

## 2020-06-21 ENCOUNTER — Encounter: Payer: Self-pay | Admitting: Anesthesiology

## 2020-06-22 ENCOUNTER — Other Ambulatory Visit: Payer: Self-pay | Admitting: Physician Assistant

## 2020-06-29 ENCOUNTER — Other Ambulatory Visit: Payer: Self-pay

## 2020-06-29 ENCOUNTER — Encounter: Payer: Self-pay | Admitting: Obstetrics & Gynecology

## 2020-06-29 ENCOUNTER — Ambulatory Visit (INDEPENDENT_AMBULATORY_CARE_PROVIDER_SITE_OTHER): Payer: 59 | Admitting: Obstetrics & Gynecology

## 2020-06-29 VITALS — BP 140/90 | HR 96 | Resp 16 | Ht 64.0 in | Wt 212.0 lb

## 2020-06-29 DIAGNOSIS — N951 Menopausal and female climacteric states: Secondary | ICD-10-CM

## 2020-06-29 DIAGNOSIS — Z01419 Encounter for gynecological examination (general) (routine) without abnormal findings: Secondary | ICD-10-CM | POA: Diagnosis not present

## 2020-06-29 DIAGNOSIS — Z09 Encounter for follow-up examination after completed treatment for conditions other than malignant neoplasm: Secondary | ICD-10-CM

## 2020-06-29 DIAGNOSIS — C189 Malignant neoplasm of colon, unspecified: Secondary | ICD-10-CM

## 2020-06-29 LAB — FOLLICLE STIMULATING HORMONE: FSH: 32.1 m[IU]/mL

## 2020-06-29 NOTE — Progress Notes (Signed)
Brenda Becker 16-Oct-1970 [id number]   History:    50 y.o.  G16P1L1 Married.  Daughter is 31 yo   RP:  Established patient presenting for annual gyn exam and Postop HSC/Myosure Excision/D+C on 06/16/2020   HPI:  Menometrorrhagia for which a HSC/Myosure Excision of Polyp and D+C done on 06/16/2020.  No pelvic pain.  Mild spotting.  No fever.  Abstinent x 2 years.  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  Patient has cold sores around the mouth occasionally, never had genital HSV.   No pelvic pain.  Chemotherapy for colon Cancer x 5 years.  Breasts normal.  Urine and bowel movements currently normal.  Body mass index much improved from 36.39.  [facility] Dr Clayborne Artist MD.  Past medical history,surgical history, family history and social history were all reviewed and documented in the EPIC chart.  Gynecologic History No LMP recorded. (Menstrual status: Chemotherapy).  Obstetric History OB History  Gravida Para Term Preterm AB Living  1 1       1   SAB IAB Ectopic Multiple Live Births               # Outcome Date GA Lbr Len/2nd Weight Sex Delivery Anes PTL Lv  1 Para              ROS: A ROS was performed and pertinent positives and negatives are included in the history.  GENERAL: No fevers or chills. HEENT: No change in vision, no earache, sore throat or sinus congestion. NECK: No pain or stiffness. CARDIOVASCULAR: No chest pain or pressure. No palpitations. PULMONARY: No shortness of breath, cough or wheeze. GASTROINTESTINAL: No abdominal pain, nausea, vomiting or diarrhea, melena or bright red blood per rectum. GENITOURINARY: No urinary frequency, urgency, hesitancy or dysuria. MUSCULOSKELETAL: No joint or muscle pain, no back pain, no recent trauma. DERMATOLOGIC: No rash, no itching, no lesions. ENDOCRINE: No polyuria, polydipsia, no heat or cold intolerance. No recent change in weight. HEMATOLOGICAL: No anemia or easy bruising or bleeding. NEUROLOGIC: No headache, seizures, numbness,  tingling or weakness. PSYCHIATRIC: No depression, no loss of interest in normal activity or change in sleep pattern.     Exam:   BP 140/90   Pulse 96   Resp 16   Ht 5\' 4"  (1.626 m)   Wt 212 lb (96.2 kg)   BMI 36.39 kg/m   Body mass index is 36.39 kg/m.  General appearance : Well developed well nourished female. No acute distress HEENT: Eyes: no retinal hemorrhage or exudates,  Neck supple, trachea midline, no carotid bruits, no thyroidmegaly Lungs: Clear to auscultation, no rhonchi or wheezes, or rib retractions  Heart: Regular rate and rhythm, no murmurs or gallops Breast:Examined in sitting and supine position were symmetrical in appearance, no palpable masses or tenderness,  no skin retraction, no nipple inversion, no nipple discharge, no skin discoloration, no axillary or supraclavicular lymphadenopathy Abdomen: no palpable masses or tenderness, no rebound or guarding Extremities: no edema or skin discoloration or tenderness  Pelvic: Vulva: Normal             Vagina: No gross lesions or discharge  Cervix: No gross lesions or discharge  Uterus  AV, normal size, shape and consistency, non-tender and mobile  Adnexa  Without masses or tenderness  Anus: Normal   Assessment/Plan:  50 y.o. female for annual exam   1. Well female exam with routine gynecological exam Normal gynecologic exam.  History of normal Pap test, last Pap test  was negative in May 2020.  We will repeat a Pap test next year.  Breast exam normal.  Screening mammogram was negative July 2021.  Patient is overdue for a colonoscopy, will schedule now.  History of colon cancer.  2. Perimenopause Will check menopausal status with an [facility] today. - [facility]  3. Status post gynecological surgery, follow-up exam Very good postop healing.  No complication.  Pathology benign.  4. Malignant neoplasm of colon, unspecified part of colon ([place])  Schedule colonoscopy. [name] MD, 3:56 PM 06/29/2020

## 2020-07-02 ENCOUNTER — Encounter: Payer: Self-pay | Admitting: Gastroenterology

## 2020-07-02 ENCOUNTER — Ambulatory Visit: Payer: Self-pay | Admitting: Obstetrics & Gynecology

## 2020-07-03 ENCOUNTER — Other Ambulatory Visit: Payer: Self-pay | Admitting: Family Medicine

## 2020-07-05 ENCOUNTER — Other Ambulatory Visit: Payer: Self-pay | Admitting: Physician Assistant

## 2020-07-10 ENCOUNTER — Other Ambulatory Visit: Payer: Self-pay | Admitting: Obstetrics & Gynecology

## 2020-07-24 ENCOUNTER — Other Ambulatory Visit: Payer: Self-pay | Admitting: Physician Assistant

## 2020-07-28 ENCOUNTER — Ambulatory Visit
Admission: RE | Admit: 2020-07-28 | Discharge: 2020-07-28 | Disposition: A | Discharge status: Home / Self Care | Payer: 59 | Source: Ambulatory Visit | Attending: Obstetrics & Gynecology | Admitting: Obstetrics & Gynecology

## 2020-07-28 ENCOUNTER — Other Ambulatory Visit: Payer: Self-pay

## 2020-07-28 DIAGNOSIS — Z1231 Encounter for screening mammogram for malignant neoplasm of breast: Secondary | ICD-10-CM

## 2020-08-09 ENCOUNTER — Other Ambulatory Visit: Payer: Self-pay | Admitting: Physician Assistant

## 2020-08-23 ENCOUNTER — Other Ambulatory Visit: Payer: Self-pay | Admitting: Physician Assistant

## 2020-08-27 ENCOUNTER — Telehealth: Payer: Self-pay | Admitting: *Deleted

## 2020-08-27 NOTE — Telephone Encounter (Signed)
Dr Lyndel Safe,  This pt had a colon with you 2016 and had a colonic mass-  she had stage IV colon cancer-  She had chemo 11-16-14 through 02-28-2015, again 03-09-2016 through 05-18-2016, again 09-22-2016 through 03-21-2017 and again 03-21-2019 through 10-2019   She has not seen you since 2016,  she has not had a colon since 2016 that I can find in her charts-  she had a scan in July that was stable. She has a colon scheduled for 09-28-2020  DO you need to see her in the Office prior to her colon or is it okay to proceed as scheduled?  Please advise- Thanks   Brenda Becker PV

## 2020-09-02 NOTE — Telephone Encounter (Signed)
Lets do OV before RG

## 2020-09-02 NOTE — Telephone Encounter (Signed)
OB 10-12 AT 4 PM WITH DR GUPTA- 9-13 PV AND 9-27 COLON CANCELED - [place] OV WITH PT TODAY- MARIE PV

## 2020-09-06 ENCOUNTER — Other Ambulatory Visit: Payer: Self-pay | Admitting: Physician Assistant

## 2020-09-10 ENCOUNTER — Ambulatory Visit (INDEPENDENT_AMBULATORY_CARE_PROVIDER_SITE_OTHER): Payer: 59 | Admitting: Family Medicine

## 2020-09-10 ENCOUNTER — Encounter: Payer: Self-pay | Admitting: Family Medicine

## 2020-09-10 ENCOUNTER — Other Ambulatory Visit: Payer: Self-pay

## 2020-09-10 VITALS — BP 144/90 | HR 76 | Temp 97.7°F | Resp 18 | Ht 64.0 in | Wt 220.0 lb

## 2020-09-10 DIAGNOSIS — C772 Secondary and unspecified malignant neoplasm of intra-abdominal lymph nodes: Secondary | ICD-10-CM

## 2020-09-10 DIAGNOSIS — I700 Atherosclerosis of aorta: Secondary | ICD-10-CM

## 2020-09-10 DIAGNOSIS — K219 Gastro-esophageal reflux disease without esophagitis: Secondary | ICD-10-CM

## 2020-09-10 DIAGNOSIS — C189 Malignant neoplasm of colon, unspecified: Secondary | ICD-10-CM | POA: Diagnosis not present

## 2020-09-10 DIAGNOSIS — E7849 Other hyperlipidemia: Secondary | ICD-10-CM | POA: Diagnosis not present

## 2020-09-10 DIAGNOSIS — Z6837 Body mass index (BMI) 37.0-37.9, adult: Secondary | ICD-10-CM

## 2020-09-10 DIAGNOSIS — Z23 Encounter for immunization: Secondary | ICD-10-CM

## 2020-09-10 DIAGNOSIS — M791 Myalgia, unspecified site: Secondary | ICD-10-CM

## 2020-09-10 DIAGNOSIS — T466X5A Adverse effect of antihyperlipidemic and antiarteriosclerotic drugs, initial encounter: Secondary | ICD-10-CM

## 2020-09-10 NOTE — Patient Instructions (Addendum)
Call insurance company and ask about Brenda Becker or Brenda Becker for weight loss medications.  Healthy Eating Following a healthy eating pattern may help you to achieve and maintain a healthy body weight, reduce the risk of chronic disease, and live a long and productive life. It is important to follow a healthy eating pattern at an appropriate calorie level for your body. Your nutritional needs should be met primarily through food by choosing a variety of nutrient-rich foods. What are tips for following this plan? Reading food labels Read labels and choose the following: Reduced or low sodium. Juices with 100% fruit juice. Foods with low saturated fats and high polyunsaturated and monounsaturated fats. Foods with whole grains, such as whole wheat, cracked wheat, brown rice, and wild rice. Whole grains that are fortified with folic acid. This is recommended for women who are pregnant or who want to become pregnant. Read labels and avoid the following: Foods with a lot of added sugars. These include foods that contain brown sugar, corn sweetener, corn syrup, dextrose, fructose, glucose, high-fructose corn syrup, honey, invert sugar, lactose, malt syrup, maltose, molasses, raw sugar, sucrose, trehalose, or turbinado sugar. Do not eat more than the following amounts of added sugar per day: 6 teaspoons (25 g) for women. 9 teaspoons (38 g) for men. Foods that contain processed or refined starches and grains. Refined grain products, such as white flour, degermed cornmeal, white bread, and white rice. Shopping Choose nutrient-rich snacks, such as vegetables, whole fruits, and nuts. Avoid high-calorie and high-sugar snacks, such as potato chips, fruit snacks, and candy. Use oil-based dressings and spreads on foods instead of solid fats such as butter, stick margarine, or cream cheese. Limit pre-made sauces, mixes, and "instant" products such as flavored rice, instant noodles, and ready-made pasta. Try more  plant-protein sources, such as tofu, tempeh, black beans, edamame, lentils, nuts, and seeds. Explore eating plans such as the Mediterranean diet or vegetarian diet. Cooking Use oil to saut or stir-fry foods instead of solid fats such as butter, stick margarine, or lard. Try baking, boiling, grilling, or broiling instead of frying. Remove the fatty part of meats before cooking. Steam vegetables in water or broth. Meal planning  At meals, imagine dividing your plate into fourths: One-half of your plate is fruits and vegetables. One-fourth of your plate is whole grains. One-fourth of your plate is protein, especially lean meats, poultry, eggs, tofu, beans, or nuts. Include low-fat dairy as part of your daily diet. Lifestyle Choose healthy options in all settings, including home, work, school, restaurants, or stores. Prepare your food safely: Wash your hands after handling raw meats. Keep food preparation surfaces clean by regularly washing with hot, soapy water. Keep raw meats separate from ready-to-eat foods, such as fruits and vegetables. Cook seafood, meat, poultry, and eggs to the recommended internal temperature. Store foods at safe temperatures. In general: Keep cold foods at 27F (4.4C) or below. Keep hot foods at 127F (60C) or above. Keep your freezer at [facility] (-17.8C) or below. Foods are no longer safe to eat when they have been between the temperatures of 40-127F (4.4-60C) for more than 2 hours. What foods should I eat? Fruits Aim to eat 2 cup-equivalents of fresh, canned (in natural juice), or frozen fruits each day. Examples of 1 cup-equivalent of fruit include 1 small apple, 8 large strawberries, 1 cup canned fruit,  cup dried fruit, or 1 cup 100% juice. Vegetables Aim to eat 2-3 cup-equivalents of fresh and frozen vegetables each day, including different varieties and  colors. Examples of 1 cup-equivalent of vegetables include 2 medium carrots, 2 cups raw, leafy  greens, 1 cup chopped vegetable (raw or cooked), or 1 medium baked potato. Grains Aim to eat 6 ounce-equivalents of whole grains each day. Examples of 1 ounce-equivalent of grains include 1 slice of bread, 1 cup ready-to-eat cereal, 3 cups popcorn, or  cup cooked rice, pasta, or cereal. Meats and other proteins Aim to eat 5-6 ounce-equivalents of protein each day. Examples of 1 ounce-equivalent of protein include 1 egg, 1/2 cup nuts or seeds, or 1 tablespoon (16 g) peanut butter. A cut of meat or fish that is the size of a deck of cards is about 3-4 ounce-equivalents. Of the protein you eat each week, try to have at least 8 ounces come from seafood. This includes salmon, trout, herring, and anchovies. Dairy Aim to eat 3 cup-equivalents of fat-free or low-fat dairy each day. Examples of 1 cup-equivalent of dairy include 1 cup (240 mL) milk, 8 ounces (250 g) yogurt, 1 ounces (44 g) natural cheese, or 1 cup (240 mL) fortified soy milk. Fats and oils Aim for about 5 teaspoons (21 g) per day. Choose monounsaturated fats, such as canola and olive oils, avocados, peanut butter, and most nuts, or polyunsaturated fats, such as sunflower, corn, and soybean oils, walnuts, pine nuts, sesame seeds, sunflower seeds, and flaxseed. Beverages Aim for six 8-oz glasses of water per day. Limit coffee to three to five 8-oz cups per day. Limit caffeinated beverages that have added calories, such as soda and energy drinks. Limit alcohol intake to no more than 1 drink a day for nonpregnant women and 2 drinks a day for men. One drink equals 12 oz of beer (355 mL), 5 oz of wine (148 mL), or 1 oz of hard liquor (44 mL). Seasoning and other foods Avoid adding excess amounts of salt to your foods. Try flavoring foods with herbs and spices instead of salt. Avoid adding sugar to foods. Try using oil-based dressings, sauces, and spreads instead of solid fats. This information is based on general [place] nutrition guidelines. For  more information, visit [employer]. Exact amounts may vary based on your nutrition needs. Summary A healthy eating plan may help you to maintain a healthy weight, reduce the risk of chronic diseases, and stay active throughout your life. Plan your meals. Make sure you eat the right portions of a variety of nutrient-rich foods. Try baking, boiling, grilling, or broiling instead of frying. Choose healthy options in all settings, including home, work, school, restaurants, or stores. This information is not intended to replace advice given to you by your health care provider. Make sure you discuss any questions you have with your health care provider. Document Revised: 04/02/2017 Document Reviewed: 04/02/2017 Elsevier Patient Education  [place].

## 2020-09-10 NOTE — Progress Notes (Signed)
Subjective:  Patient ID: Brenda Becker, female    DOB: 1970/10/12  Age: 50 y.o. MRN: [id number]  Chief Complaint  Patient presents with   Hyperlipidemia    HPI Hyperlipidemia: on zetia 10 mg once daily and fish oil 1800 mg once daily at night. Started on red yeast rice x 3 months. Eats fairly healthy. Restarted crestor, but caused severe myalgias.   GERD: on omeprazole 40 mg once daily.  Wt Mgmt: stopped phentermine, because it did not help.  Colon Cancer stage IV. 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 Bilateral adnexal cysts - both have decreased in size 3. Post menopausal uterine bleeding - being managed by OB/GYN 4. ECOG PS 0   Current Outpatient Medications on File Prior to Visit  Medication Sig Dispense Refill   ezetimibe (ZETIA) 10 MG tablet TAKE ONE TABLET BY MOUTH EVERY DAY 30 tablet 0   hydrocortisone 2.5 % cream Apply topically as needed.     Omega-3 Fatty Acids (FISH OIL PO) Take 1,080 mg by mouth at bedtime.     omeprazole (PRILOSEC) 40 MG capsule Take 1 capsule (40 mg total) by mouth daily. 30 capsule 3   Red Yeast Rice Extract (RED YEAST RICE PO) Take 2 tablets by mouth daily.     Turmeric 500 MG TABS Take 2 tablets by mouth daily.     Current Facility-Administered Medications on File Prior to Visit  Medication Dose Route Frequency Provider Last Rate Last Admin   sodium chloride flush (NS) 0.9 % injection  10 mL  10 mL Intravenous PRN Ladell Pier, MD   10 mL at 05/28/15 2585   Past Medical History:  Diagnosis Date   Abnormal uterine bleeding (AUB)    CKD (chronic kidney disease), stage II    Endometrial polyp    GAD (generalized anxiety disorder)    GERD (gastroesophageal reflux disease)    History of hepatitis 04/2017   IMMUNOTHERAPY INDUCED HEPATITIS,  TREATED   Hypomagnesemia    Malignant neoplasm of transverse colon ([place]) 2016   dx 2016,  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 [place] Cage [facility])   Mixed hyperlipidemia    Peripheral neuropathy due to chemotherapy ([place])    06-14-2020  per pt right arm to fingers   PONV (postoperative nausea and vomiting)    vomit with Gallbladder surgery   Wears glasses    Past Surgical History:  Procedure Laterality Date   BREAST BIOPSY Right 2019   per pt benign   COLON SURGERY  11/16/2014   '@[facility]'$ ---  partial transverse colectomy w/ node dissection   DILATATION & CURETTAGE/HYSTEROSCOPY WITH MYOSURE N/A 06/16/2020   Procedure: DILATATION & CURETTAGE/HYSTEROSCOPY WITH MYOSURE EXCISION;  Surgeon: [name], MD;  Location: [place];  Service: Gynecology;  Laterality: N/A;   EUS N/A 02/17/2016   Procedure: UPPER ENDOSCOPIC ULTRASOUND (EUS) RADIAL;  Surgeon:  Milus Banister, MD;  Location: [facility];  Service: Endoscopy;  Laterality: N/A;   EUS N/A 07/20/2016   Procedure: UPPER ENDOSCOPIC ULTRASOUND (EUS) RADIAL;  Surgeon: Milus Banister, MD;  Location: [facility];  Service: Endoscopy;  Laterality: N/A;   KNEE ARTHROSCOPY Right 2012   LAPAROSCOPIC CHOLECYSTECTOMY  2003   PORTA CATH INSERTION  12/22/2014   TONSILLECTOMY  1994    Family History  Problem Relation Age of Onset   Other Father        doesn't go to doctor or get  colonoscopies   Other Brother        stomach issues; has had early colonoscopy   Other Maternal Aunt        won't go to doctor; probably has not had colonoscopy   Cancer Cousin        "some kind of stomach cancer" dx. early 3s   Skin cancer Maternal Grandmother        non-melanoma; dx. late 60s   Prostate cancer Maternal Grandfather        dx. late 88s   Stomach cancer Other        dx. "older"   Colon cancer Other        dx. 44s   Cancer Other        "female cancer" dx. "later age";    Breast cancer Other        dx. 6s-60s   Breast cancer Paternal Grandmother        dx. 50s   Cervical cancer Paternal Aunt 30       treated at [facility] cancer Paternal Aunt 57       smoker   Social History   Socioeconomic History   Marital status: Married    Spouse name: Not on file   Number of children: 1   Years of education: Not on file   Highest education level: Not on file  Occupational History   Occupation: Freight forwarder    Comment: Health and safety inspector  Tobacco Use   Smoking status: Never   Smokeless tobacco: Never  Vaping Use   Vaping Use: Never used  Substance and Sexual Activity   Alcohol use: No   Drug use: Never   Sexual activity: Not Currently    Partners: Male    Birth control/protection: Abstinence    Comment: MARRIED- 70 YRS  Other Topics Concern   Not on file  Social History Narrative   Not on file   Social Determinants of Health   Financial Resource Strain: Not on file  Food Insecurity: Not on file  Transportation Needs: Not on file  Physical Activity: Not on file  Stress: Not on file  Social Connections: Not on file    Review of Systems  Constitutional:  Positive for fatigue and unexpected weight change (weight gain). Negative for chills and fever.  HENT:  Negative for congestion, ear pain, rhinorrhea and sore throat.   Respiratory:  Negative for cough and shortness of breath.   Cardiovascular:  Negative for chest pain and palpitations.   Gastrointestinal:  Negative for abdominal pain, constipation, diarrhea, nausea and vomiting.  Genitourinary:  Negative for dysuria and urgency.  Musculoskeletal:  Negative for back pain and myalgias.  Neurological:  Negative for dizziness, weakness, light-headedness and headaches.  Psychiatric/Behavioral:  Negative for dysphoric mood. The patient is not nervous/anxious.     Objective:  BP (!) 144/90   Pulse 76   Temp 97.7 F (36.5 C)   Resp  18   Ht $R'5\' 4"'rk$  (1.626 m)   Wt 220 lb (99.8 kg)   BMI 37.76 kg/m   BP/Weight 09/10/2020 06/29/2020 3/66/4403  Systolic BP 474 259 563  Diastolic BP 90 90 91  Wt. (Lbs) 220 212 208.4  BMI 37.76 36.39 33.64    Physical Exam Vitals reviewed.  Constitutional:      Appearance: Normal appearance. She is obese.  Neck:     Vascular: No carotid bruit.  Cardiovascular:     Rate and Rhythm: Normal rate and regular rhythm.     Heart sounds: Normal heart sounds.  Pulmonary:     Effort: Pulmonary effort is normal. No respiratory distress.     Breath sounds: Normal breath sounds.  Abdominal:     General: Abdomen is flat. Bowel sounds are normal.     Palpations: Abdomen is soft.     Tenderness: There is no abdominal tenderness.  Neurological:     Mental Status: She is alert and oriented to person, place, and time.  Psychiatric:        Mood and Affect: Mood normal.        Behavior: Behavior normal.    Diabetic Foot Exam - Simple   No data filed      Lab Results  Component Value Date   WBC 6.4 09/10/2020   HGB 12.5 09/10/2020   HCT 39.7 09/10/2020   PLT 259 09/10/2020   GLUCOSE 100 (H) 09/10/2020   CHOL 228 (H) 09/10/2020   TRIG 86 09/10/2020   HDL 44 09/10/2020   LDLCALC 169 (H) 09/10/2020   ALT 16 09/10/2020   AST 18 09/10/2020   NA 142 09/10/2020   K 5.0 09/10/2020   CL 105 09/10/2020   CREATININE 1.14 (H) 09/10/2020   BUN 12 09/10/2020   CO2 23 09/10/2020   TSH 6.78 (H) 03/29/2020   INR 0.94 12/22/2014      Assessment &  Plan:   Problem List Items Addressed This Visit       Cardiovascular and Mediastinum   Atherosclerosis of aorta (HCC)    LDL goal less than 70.  Low fat diet.  Address medications after labs come back.         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        GERD without esophagitis    The current medical regimen is effective;  continue present plan and medications.         Other   Familial hyperlipidemia, high LDL    Low fat diet.  Intolerant to statins.  Recommend continue to work on eating healthy diet and exercise.       Relevant Orders   CBC with Differential/Platelet (Completed)   Comprehensive metabolic panel (Completed)   Lipid panel (Completed)   Class 2 severe obesity due to excess calories with serious comorbidity and body mass index (BMI) of 37.0 to 37.9 in adult [facility])    Recommend continue to work on eating healthy diet and exercise.       Myalgia due to statin    Intolerant to statin.      Other Visit Diagnoses     Need for influenza vaccination    -  Primary   Relevant Orders   Flu Vaccine MDCK QUAD PF (Completed)       Follow-up: Return in about 3 months (around 12/10/2020) for fasting.Marland Kitchen  An After Visit Summary was printed and given to the  patient.  Rochel Brome, MD [facility] [phone]

## 2020-09-11 ENCOUNTER — Encounter: Payer: Self-pay | Admitting: Oncology

## 2020-09-11 LAB — CBC WITH DIFFERENTIAL/PLATELET
Basophils Absolute: 0.1 10*3/uL (ref 0.0–0.2)
Basos: 1 %
EOS (ABSOLUTE): 0.5 10*3/uL — ABNORMAL HIGH (ref 0.0–0.4)
Eos: 8 %
Hematocrit: 39.7 % (ref 34.0–46.6)
Hemoglobin: 12.5 g/dL (ref 11.1–15.9)
Immature Grans (Abs): 0 10*3/uL (ref 0.0–0.1)
Immature Granulocytes: 0 %
Lymphocytes Absolute: 2.2 10*3/uL (ref 0.7–3.1)
Lymphs: 35 %
MCH: 25.4 pg — ABNORMAL LOW (ref 26.6–33.0)
MCHC: 31.5 g/dL (ref 31.5–35.7)
MCV: 81 fL (ref 79–97)
Monocytes Absolute: 0.6 10*3/uL (ref 0.1–0.9)
Monocytes: 9 %
Neutrophils Absolute: 3 10*3/uL (ref 1.4–7.0)
Neutrophils: 47 %
Platelets: 259 10*3/uL (ref 150–450)
RBC: 4.93 x10E6/uL (ref 3.77–5.28)
RDW: 14.5 % (ref 11.7–15.4)
WBC: 6.4 10*3/uL (ref 3.4–10.8)

## 2020-09-11 LAB — COMPREHENSIVE METABOLIC PANEL
ALT: 16 IU/L (ref 0–32)
AST: 18 IU/L (ref 0–40)
Albumin/Globulin Ratio: 1.8 (ref 1.2–2.2)
Albumin: 4.6 g/dL (ref 3.8–4.8)
Alkaline Phosphatase: 77 IU/L (ref 44–121)
BUN/Creatinine Ratio: 11 (ref 9–23)
BUN: 12 mg/dL (ref 6–24)
Bilirubin Total: 0.3 mg/dL (ref 0.0–1.2)
CO2: 23 mmol/L (ref 20–29)
Calcium: 9.8 mg/dL (ref 8.7–10.2)
Chloride: 105 mmol/L (ref 96–106)
Creatinine, Ser: 1.14 mg/dL — ABNORMAL HIGH (ref 0.57–1.00)
Globulin, Total: 2.5 g/dL (ref 1.5–4.5)
Glucose: 100 mg/dL — ABNORMAL HIGH (ref 65–99)
Potassium: 5 mmol/L (ref 3.5–5.2)
Sodium: 142 mmol/L (ref 134–144)
Total Protein: 7.1 g/dL (ref 6.0–8.5)
eGFR: 59 mL/min/{1.73_m2} — ABNORMAL LOW (ref >59)

## 2020-09-11 LAB — LIPID PANEL
Chol/HDL Ratio: 5.2 ratio — ABNORMAL HIGH (ref 0.0–4.4)
Cholesterol, Total: 228 mg/dL — ABNORMAL HIGH (ref 100–199)
HDL: 44 mg/dL (ref >39)
LDL Chol Calc (NIH): 169 mg/dL — ABNORMAL HIGH (ref 0–99)
Triglycerides: 86 mg/dL (ref 0–149)
VLDL Cholesterol Cal: 15 mg/dL (ref 5–40)

## 2020-09-11 LAB — CARDIOVASCULAR RISK ASSESSMENT

## 2020-09-12 DIAGNOSIS — T466X5A Adverse effect of antihyperlipidemic and antiarteriosclerotic drugs, initial encounter: Secondary | ICD-10-CM

## 2020-09-12 DIAGNOSIS — M791 Myalgia, unspecified site: Secondary | ICD-10-CM

## 2020-09-12 DIAGNOSIS — K219 Gastro-esophageal reflux disease without esophagitis: Secondary | ICD-10-CM | POA: Insufficient documentation

## 2020-09-12 HISTORY — DX: Myalgia, unspecified site: M79.10

## 2020-09-12 HISTORY — DX: Adverse effect of antihyperlipidemic and antiarteriosclerotic drugs, initial encounter: T46.6X5A

## 2020-09-12 NOTE — Assessment & Plan Note (Signed)
Management per specialist. 

## 2020-09-12 NOTE — Assessment & Plan Note (Signed)
Low fat diet.  Intolerant to statins.  Recommend continue to work on eating healthy diet and exercise.

## 2020-09-12 NOTE — Assessment & Plan Note (Signed)
The current medical regimen is effective;  continue present plan and medications.  

## 2020-09-12 NOTE — Assessment & Plan Note (Signed)
LDL goal less than 70.  Low fat diet.  Address medications after labs come back.

## 2020-09-12 NOTE — Assessment & Plan Note (Signed)
Recommend continue to work on eating healthy diet and exercise.  

## 2020-09-12 NOTE — Assessment & Plan Note (Signed)
Intolerant to statin.  ?

## 2020-09-27 ENCOUNTER — Other Ambulatory Visit: Payer: Self-pay | Admitting: Family Medicine

## 2020-09-28 ENCOUNTER — Encounter: Payer: 59 | Admitting: Gastroenterology

## 2020-10-13 ENCOUNTER — Ambulatory Visit (INDEPENDENT_AMBULATORY_CARE_PROVIDER_SITE_OTHER): Payer: 59 | Admitting: Gastroenterology

## 2020-10-13 ENCOUNTER — Encounter: Payer: Self-pay | Admitting: Gastroenterology

## 2020-10-13 ENCOUNTER — Other Ambulatory Visit: Payer: Self-pay

## 2020-10-13 VITALS — BP 148/96 | HR 80 | Ht 64.75 in | Wt 223.5 lb

## 2020-10-13 DIAGNOSIS — Z85038 Personal history of other malignant neoplasm of large intestine: Secondary | ICD-10-CM

## 2020-10-13 NOTE — Patient Instructions (Signed)
If you are age 51 or older, your body mass index should be between 23-30. Your Body mass index is 37.48 kg/m. If this is out of the aforementioned range listed, please consider follow up with your Primary Care Provider.  If you are age 26 or younger, your body mass index should be between 19-25. Your Body mass index is 37.48 kg/m. If this is out of the aformentioned range listed, please consider follow up with your Primary Care Provider.   __________________________________________________________  The [facility] providers would like to encourage you to use [facility] to communicate with providers for non-urgent requests or questions.  Due to long hold times on the telephone, sending your provider a message by [facility] may be a faster and more efficient way to get a response.  Please allow 48 business hours for a response.  Please remember that this is for non-urgent requests.   You have been scheduled for a colonoscopy. Please follow written instructions given to you at your visit today.  Please pick up your prep supplies at the pharmacy within the next 1-3 days. If you use inhalers (even only as needed), please bring them with you on the day of your procedure.  Please call with any questions or concerns.  Thank you,  Dr. Jackquline Denmark

## 2020-10-13 NOTE — Progress Notes (Signed)
Chief Complaint: To get established  Referring Provider:  Rochel Brome, MD      ASSESSMENT AND PLAN;   #1. 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 Last colon Nov 2017 by Dr. Morton Stall was neg for local recurrence.  Being closely followed by Dr Altamease Oiler (oncology @ [facility])  Plan: -Colon Nov 2022 with miralax prep if OK with Dr Altamease Oiler.  Consepcion has appt for CT chest Abdo pelvis followed by OV October 22, 2020.  Discussed risks and benefits. HPI:    Brenda Becker is a 50 y.o. female   For FU visit for colon.   No nausea, vomiting, heartburn, regurgitation, odynophagia or dysphagia.  No significant diarrhea or constipation.  No melena or hematochezia. No unintentional weight loss. No abdominal pain.  CT chest/Abdo/pelvis scheduled for October 23, 2019 followed by OV with Dr Altamease Oiler.  [name] will discuss with her.  Colon 11/09/2015 Dr Morton Stall: Report awaited.  Per notes, neg for local recurrence.   Ca H/O DIAGNOSIS 2016 stage IV MSS KRAS-WT Transverse Colon Cancer - 11/16/2014 Partial Transverse Colectomy: pT3N2b - 12/30/14-05/28/15 Adjuvant CAPOX (oxaliplatin held with C7 and C8) - 03/09/16-05/18/16 FOLFIRI + bev, stopped for progression in RP nodes - 09/22/16-03/2017 [facility] 1632 with ipi/nivo/panitumumab - complicated by grade 3 irAE of hepatitis - 03/2017 - 10/2019 Panitumumab  Colonoscopy 10/2014 -Mid transverse colon mass. Bx-adenocarcinoma Past Medical History:  Diagnosis Date   Abnormal uterine bleeding (AUB)    CKD (chronic kidney disease), stage II    Endometrial polyp    GAD (generalized anxiety disorder)    GERD (gastroesophageal reflux disease)    History of hepatitis 04/2017   IMMUNOTHERAPY INDUCED HEPATITIS,  TREATED   Hypomagnesemia    Malignant neoplasm of transverse colon ([place]) 2016   dx 2016,  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;  11-16-2014  partial transerve colectomy w/ node dissection @ [facility];  started  chemo 12-30-2014;   followed by dr [name] ([facility]) and Dr h. Milford Cage [facility])   Mixed hyperlipidemia    Peripheral neuropathy due to chemotherapy ([place])    06-14-2020  per pt right arm to fingers   PONV (postoperative nausea and vomiting)    vomit with Gallbladder surgery   Vitamin D deficiency    Wears glasses     Past Surgical History:  Procedure Laterality Date   BREAST BIOPSY Right 2019   per pt benign   COLON SURGERY  11/16/2014   _0 ---  partial transverse colectomy w/ node dissection   COLONOSCOPY  10/19/2014   Colonic mass (biopsied and tattooed) Small internal hemorrhoids.   DILATATION & CURETTAGE/HYSTEROSCOPY WITH MYOSURE N/A 06/16/2020   Procedure: DILATATION & CURETTAGE/HYSTEROSCOPY WITH MYOSURE EXCISION;  Surgeon: [name], MD;  Location: [facility];  Service: Gynecology;  Laterality: N/A;   EUS N/A 02/17/2016   Procedure: UPPER ENDOSCOPIC ULTRASOUND (EUS) RADIAL;  Surgeon: Milus Banister, MD;  Location: [facility];  Service: Endoscopy;  Laterality: N/A;   EUS N/A 07/20/2016   Procedure: UPPER ENDOSCOPIC ULTRASOUND (EUS) RADIAL;  Surgeon: Milus Banister, MD;  Location: [facility];  Service: Endoscopy;  Laterality: N/A;   KNEE ARTHROSCOPY Right 2012   LAPAROSCOPIC CHOLECYSTECTOMY  2003   PORTA CATH INSERTION  12/22/2014   TONSILLECTOMY  1994    Family History  Problem Relation Age of Onset   Other Father        doesn't go to doctor or  get colonoscopies   Other Brother        stomach issues; has had early colonoscopy   Other Maternal Aunt        won't go to doctor; probably has not had colonoscopy   Cancer Cousin        "some kind of stomach cancer" dx. early 57s   Skin cancer Maternal Grandmother        non-melanoma; dx. late 60s   Prostate cancer Maternal Grandfather        dx. late 85s   Stomach cancer Other        dx. "older"   Colon cancer Other        dx. 37s   Cancer Other        "female  cancer" dx. "later age";    Breast cancer Other        dx. 50s-60s   Breast cancer Paternal Grandmother        dx. 71s   Cervical cancer Paternal Aunt 63       treated at [place] cancer Paternal Aunt 57       smoker    Social History   Tobacco Use   Smoking status: Never   Smokeless tobacco: Never  Vaping Use   Vaping Use: Never used  Substance Use Topics   Alcohol use: No   Drug use: Never    Current Outpatient Medications  Medication Sig Dispense Refill   ezetimibe (ZETIA) 10 MG tablet TAKE ONE TABLET BY MOUTH EVERY DAY 30 tablet 0   hydrocortisone 2.5 % cream Apply topically as needed.     Omega-3 Fatty Acids (FISH OIL PO) Take 1,080 mg by mouth at bedtime.     omeprazole (PRILOSEC) 40 MG capsule Take 1 capsule (40 mg total) by mouth daily. 30 capsule 3   Turmeric 500 MG TABS Take 2 tablets by mouth daily.     No current facility-administered medications for this visit.   Facility-Administered Medications Ordered in Other Visits  Medication Dose Route Frequency Provider Last Rate Last Admin   sodium chloride flush (NS) 0.9 % injection 10 mL  10 mL Intravenous PRN Ladell Pier, MD   10 mL at 05/28/15 0630    Allergies  Allergen Reactions   Crestor [Rosuvastatin Calcium] Other (See Comments)    Muscle pain   Lipitor [Atorvastatin Calcium]     Elevated LFTs     Review of Systems:  Constitutional: Denies fever, chills, diaphoresis, appetite change and fatigue.  HEENT: Denies photophobia, eye pain, redness, hearing loss, ear pain, congestion, sore throat, rhinorrhea, sneezing, mouth sores, neck pain, neck stiffness and tinnitus.   Respiratory: Denies SOB, DOE, cough, chest tightness,  and wheezing.   Cardiovascular: Denies chest pain, palpitations and leg swelling.  Genitourinary: Denies dysuria, urgency, frequency, hematuria, flank pain and difficulty urinating.  Musculoskeletal: Denies myalgias, back pain, joint swelling, arthralgias and gait problem.   Skin: No rash.  Neurological: Denies dizziness, seizures, syncope, weakness, light-headedness, numbness and headaches.  Hematological: Denies adenopathy. Easy bruising, personal or family bleeding history  Psychiatric/Behavioral: No anxiety or depression     Physical Exam:    BP (!) 148/96   Pulse 80   Ht 5' 4.75" (1.645 m)   Wt 223 lb 8 oz (101.4 kg)   SpO2 98%   BMI 37.48 kg/m  Wt Readings from Last 3 Encounters:  10/13/20 223 lb 8 oz (101.4 kg)  09/10/20 220 lb (99.8 kg)  06/29/20 212 lb (  96.2 kg)   Constitutional:  Well-developed, in no acute distress. Psychiatric: Normal mood and affect. Behavior is normal. HEENT: Pupils normal.  Conjunctivae are normal. No scleral icterus. Cardiovascular: Normal rate, regular rhythm. No edema Pulmonary/chest: Effort normal and breath sounds normal. No wheezing, rales or rhonchi. Abdominal: Soft, nondistended. Nontender. Bowel sounds active throughout. There are no masses palpable. No hepatomegaly.  Has well-healed surgical scars.  Small incisional hernia. Neurological: Alert and oriented to person place and time. Skin: Skin is warm and dry. No rashes noted.  Data Reviewed: I have personally reviewed following labs and imaging studies  CBC: CBC Latest Ref Rng & Units 09/10/2020 06/16/2020 06/04/2020  WBC 3.4 - 10.8 x10E3/uL 6.4 6.6 7.6  Hemoglobin 11.1 - 15.9 g/dL 12.5 11.4(L) 13.0  Hematocrit 34.0 - 46.6 % 39.7 35.4(L) 40.0  Platelets 150 - 450 x10E3/uL 259 307 261    CMP: CMP Latest Ref Rng & Units 09/10/2020 06/04/2020 04/21/2020  Glucose 65 - 99 mg/dL 100(H) 94 94  BUN 6 - 24 mg/dL _0 Creatinine 0.57 - 1.00 mg/dL 1.14(H) 1.26(H) 1.22(H)  Sodium 134 - 144 mmol/L 142 142 144  Potassium 3.5 - 5.2 mmol/L 5.0 4.4 4.3  Chloride 96 - 106 mmol/L 105 106 107(H)  CO2 20 - 29 mmol/L _1 Calcium 8.7 - 10.2 mg/dL 9.8 10.0 10.0  Total Protein 6.0 - 8.5 g/dL 7.1 7.4 7.4  Total Bilirubin 0.0 - 1.2 mg/dL 0.3 0.4 <0.2  Alkaline Phos 44  - 121 IU/L 77 72 88  AST 0 - 40 IU/L _2 ALT 0 - 32 IU/L _3 Carmell Austria, MD 10/13/2020, 3:54 PM  Cc: Rochel Brome, MD

## 2020-11-01 ENCOUNTER — Other Ambulatory Visit: Payer: Self-pay | Admitting: Family Medicine

## 2020-11-29 ENCOUNTER — Encounter: Payer: Self-pay | Admitting: Gastroenterology

## 2020-12-03 ENCOUNTER — Encounter: Payer: Self-pay | Admitting: Oncology

## 2020-12-09 ENCOUNTER — Ambulatory Visit (AMBULATORY_SURGERY_CENTER): Payer: 59 | Admitting: Gastroenterology

## 2020-12-09 ENCOUNTER — Encounter: Payer: Self-pay | Admitting: Gastroenterology

## 2020-12-09 ENCOUNTER — Other Ambulatory Visit: Payer: Self-pay

## 2020-12-09 VITALS — BP 145/94 | HR 66 | Temp 97.1°F | Resp 15 | Ht 64.0 in | Wt 223.0 lb

## 2020-12-09 DIAGNOSIS — Z85038 Personal history of other malignant neoplasm of large intestine: Secondary | ICD-10-CM

## 2020-12-09 MED ORDER — SODIUM CHLORIDE 0.9 % IV SOLN: 500.0000 mL | Freq: Once | INTRAVENOUS | Status: DC

## 2020-12-09 NOTE — Progress Notes (Signed)
Chief Complaint: To get established  Referring Provider:  Rochel Brome, MD      ASSESSMENT AND PLAN;   #1. 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 Last colon Nov 2017 by Dr. Morton Stall was neg for local recurrence.  Being closely followed by Dr Altamease Oiler (oncology @ [facility])  Plan: -Colon   Discussed risks and benefits. HPI:    Brenda Becker is a 50 y.o. female   For FU visit for colon.   No nausea, vomiting, heartburn, regurgitation, odynophagia or dysphagia.  No significant diarrhea or constipation.  No melena or hematochezia. No unintentional weight loss. No abdominal pain.  CT chest/Abdo/pelvis scheduled for October 23, 2019 followed by OV with Dr Altamease Oiler.  [name] will discuss with her.  Colon 11/09/2015 Dr Morton Stall: Report awaited.  Per notes, neg for local recurrence.   Ca H/O DIAGNOSIS 2016 stage IV MSS KRAS-WT Transverse Colon Cancer - 11/16/2014 Partial Transverse Colectomy: pT3N2b - 12/30/14-05/28/15 Adjuvant CAPOX (oxaliplatin held with C7 and C8) - 03/09/16-05/18/16 FOLFIRI + bev, stopped for progression in RP nodes - 09/22/16-03/2017 [facility] 1632 with ipi/nivo/panitumumab - complicated by grade 3 irAE of hepatitis - 03/2017 - 10/2019 Panitumumab  Colonoscopy 10/2014 -Mid transverse colon mass. Bx-adenocarcinoma Past Medical History:  Diagnosis Date   Abnormal uterine bleeding (AUB)    CKD (chronic kidney disease), stage II    Endometrial polyp    GAD (generalized anxiety disorder)    GERD (gastroesophageal reflux disease)    History of hepatitis 04/2017   IMMUNOTHERAPY INDUCED HEPATITIS,  TREATED   Hypomagnesemia    Malignant neoplasm of transverse colon ([place]) 2016   dx 2016,  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 Milford Cage [facility])   Mixed  hyperlipidemia    Peripheral neuropathy due to chemotherapy ([place])    06-14-2020  per pt right arm to fingers   PONV (postoperative nausea and vomiting)    vomit with Gallbladder surgery   Vitamin D deficiency    Wears glasses     Past Surgical History:  Procedure Laterality Date   BREAST BIOPSY Right 2019   per pt benign   COLON SURGERY  11/16/2014   '@[facility]'$ ---  partial transverse colectomy w/ node dissection   COLONOSCOPY  10/19/2014   Colonic mass (biopsied and tattooed) Small internal hemorrhoids.   DILATATION & CURETTAGE/HYSTEROSCOPY WITH MYOSURE N/A 06/16/2020   Procedure: DILATATION & CURETTAGE/HYSTEROSCOPY WITH MYOSURE EXCISION;  Surgeon: [name], MD;  Location: [facility];  Service: Gynecology;  Laterality: N/A;   EUS N/A 02/17/2016   Procedure: UPPER ENDOSCOPIC ULTRASOUND (EUS) RADIAL;  Surgeon: Milus Banister, MD;  [facility];  Service: Endoscopy;  Laterality: N/A;   EUS N/A 07/20/2016   Procedure: UPPER ENDOSCOPIC ULTRASOUND (EUS) RADIAL;  Surgeon: Milus Banister, MD;  [facility];  Service: Endoscopy;  Laterality: N/A;   KNEE ARTHROSCOPY Right 2012   LAPAROSCOPIC CHOLECYSTECTOMY  2003   PORTA CATH INSERTION  12/22/2014   TONSILLECTOMY  1994    Family History  Problem Relation Age of Onset   Other Father        doesn't go to doctor or get colonoscopies   Other Brother        stomach issues; has had early colonoscopy   Other Maternal Aunt  won't go to doctor; probably has not had colonoscopy   Cervical cancer Paternal Aunt 57       treated at [place] cancer Paternal Aunt 57       smoker   Skin cancer Maternal Grandmother        non-melanoma; dx. late 60s   Prostate cancer Maternal Grandfather        dx. late 2s   Breast cancer Paternal Grandmother        dx. 21s   Cancer Cousin        "some kind of stomach cancer" dx. early 34s   Stomach cancer Other        dx. "older"   Colon cancer Other         dx. 22s   Cancer Other        "female cancer" dx. "later age";    Breast cancer Other        dx. 50s-60s   Ulcerative colitis Neg Hx     Social History   Tobacco Use   Smoking status: Never   Smokeless tobacco: Never  Vaping Use   Vaping Use: Never used  Substance Use Topics   Alcohol use: No   Drug use: Never    Current Outpatient Medications  Medication Sig Dispense Refill   ezetimibe (ZETIA) 10 MG tablet TAKE ONE TABLET BY MOUTH EVERY DAY 30 tablet 0   hydrocortisone 2.5 % cream Apply topically as needed.     Omega-3 Fatty Acids (FISH OIL PO) Take 1,080 mg by mouth at bedtime.     omeprazole (PRILOSEC) 40 MG capsule Take 1 capsule (40 mg total) by mouth daily. 30 capsule 3   Turmeric 500 MG TABS Take 2 tablets by mouth daily.     Current Facility-Administered Medications  Medication Dose Route Frequency Provider Last Rate Last Admin   0.9 %  sodium chloride infusion  500 mL Intravenous Once Jackquline Denmark, MD       Facility-Administered Medications Ordered in Other Visits  Medication Dose Route Frequency Provider Last Rate Last Admin   sodium chloride flush (NS) 0.9 % injection 10 mL  10 mL Intravenous PRN Ladell Pier, MD   10 mL at 05/28/15 1884    Allergies  Allergen Reactions   Crestor [Rosuvastatin Calcium] Other (See Comments)    Muscle pain   Lipitor [Atorvastatin Calcium]     Elevated LFTs     Review of Systems:  Constitutional: Denies fever, chills, diaphoresis, appetite change and fatigue.  HEENT: Denies photophobia, eye pain, redness, hearing loss, ear pain, congestion, sore throat, rhinorrhea, sneezing, mouth sores, neck pain, neck stiffness and tinnitus.   Respiratory: Denies SOB, DOE, cough, chest tightness,  and wheezing.   Cardiovascular: Denies chest pain, palpitations and leg swelling.  Genitourinary: Denies dysuria, urgency, frequency, hematuria, flank pain and difficulty urinating.  Musculoskeletal: Denies myalgias, back pain, joint  swelling, arthralgias and gait problem.  Skin: No rash.  Neurological: Denies dizziness, seizures, syncope, weakness, light-headedness, numbness and headaches.  Hematological: Denies adenopathy. Easy bruising, personal or family bleeding history  Psychiatric/Behavioral: No anxiety or depression     Physical Exam:    BP 139/79   Pulse 83   Temp (!) 97.1 F (36.2 C) (Temporal)   Ht $R'5\' 4"'Kx$  (1.626 m)   Wt 223 lb (101.2 kg)   SpO2 98%   BMI 38.28 kg/m  Wt Readings from Last 3 Encounters:  12/09/20 223 lb (101.2 kg)  10/13/20 223 lb  8 oz (101.4 kg)  09/10/20 220 lb (99.8 kg)   Constitutional:  Well-developed, in no acute distress. Psychiatric: Normal mood and affect. Behavior is normal. HEENT: Pupils normal.  Conjunctivae are normal. No scleral icterus. Cardiovascular: Normal rate, regular rhythm. No edema Pulmonary/chest: Effort normal and breath sounds normal. No wheezing, rales or rhonchi. Abdominal: Soft, nondistended. Nontender. Bowel sounds active throughout. There are no masses palpable. No hepatomegaly.  Has well-healed surgical scars.  Small incisional hernia. Neurological: Alert and oriented to person place and time. Skin: Skin is warm and dry. No rashes noted.  Data Reviewed: I have personally reviewed following labs and imaging studies  CBC: CBC Latest Ref Rng & Units 09/10/2020 06/16/2020 06/04/2020  WBC 3.4 - 10.8 x10E3/uL 6.4 6.6 7.6  Hemoglobin 11.1 - 15.9 g/dL 12.5 11.4(L) 13.0  Hematocrit 34.0 - 46.6 % 39.7 35.4(L) 40.0  Platelets 150 - 450 x10E3/uL 259 307 261    CMP: CMP Latest Ref Rng & Units 09/10/2020 06/04/2020 04/21/2020  Glucose 65 - 99 mg/dL 100(H) 94 94  BUN 6 - 24 mg/dL $Remove'12 13 14  'ZYKcByD$ Creatinine 0.57 - 1.00 mg/dL 1.14(H) 1.26(H) 1.22(H)  Sodium 134 - 144 mmol/L 142 142 144  Potassium 3.5 - 5.2 mmol/L 5.0 4.4 4.3  Chloride 96 - 106 mmol/L 105 106 107(H)  CO2 20 - 29 mmol/L $RemoveB'23 22 22  'wfRgQcCz$ Calcium 8.7 - 10.2 mg/dL 9.8 10.0 10.0  Total Protein 6.0 - 8.5 g/dL 7.1 7.4  7.4  Total Bilirubin 0.0 - 1.2 mg/dL 0.3 0.4 <0.2  Alkaline Phos 44 - 121 IU/L 77 72 88  AST 0 - 40 IU/L $Remov'18 15 12  'OtokYb$ ALT 0 - 32 IU/L $Remov'16 17 18       'qiYhvN$ Carmell Austria, MD 12/09/2020, 8:27 AM  Cc: Rochel Brome, MD

## 2020-12-09 NOTE — Patient Instructions (Addendum)
Handout provided on high-fiber diet, diverticulosis and hemorrhoids.   YOU HAD AN ENDOSCOPIC PROCEDURE TODAY AT [facility]:   Refer to the procedure report that was given to you for any specific questions about what was found during the examination.  If the procedure report does not answer your questions, please call your gastroenterologist to clarify.  If you requested that your care partner not be given the details of your procedure findings, then the procedure report has been included in a sealed envelope for you to review at your convenience later.  YOU SHOULD EXPECT: Some feelings of bloating in the abdomen. Passage of more gas than usual.  Walking can help get rid of the air that was put into your GI tract during the procedure and reduce the bloating. If you had a lower endoscopy (such as a colonoscopy or flexible sigmoidoscopy) you may notice spotting of blood in your stool or on the toilet paper. If you underwent a bowel prep for your procedure, you may not have a normal bowel movement for a few days.  Please Note:  You might notice some irritation and congestion in your nose or some drainage.  This is from the oxygen used during your procedure.  There is no need for concern and it should clear up in a day or so.  SYMPTOMS TO REPORT IMMEDIATELY:  Following lower endoscopy (colonoscopy or flexible sigmoidoscopy):  Excessive amounts of blood in the stool  Significant tenderness or worsening of abdominal pains  Swelling of the abdomen that is new, acute  Fever of 100F or higher  For urgent or emergent issues, a gastroenterologist can be reached at any hour by calling [phone]. Do not use MyChart messaging for urgent concerns.    DIET:  We do recommend a small meal at first, but then you may proceed to your regular diet.  Drink plenty of fluids but you should avoid alcoholic beverages for 24 hours.  ACTIVITY:  You should plan to take it easy for the rest of today  and you should NOT DRIVE or use heavy machinery until tomorrow (because of the sedation medicines used during the test).    FOLLOW UP: Our staff will call the number listed on your records 48-72 hours following your procedure to check on you and address any questions or concerns that you may have regarding the information given to you following your procedure. If we do not reach you, we will leave a message.  We will attempt to reach you two times.  During this call, we will ask if you have developed any symptoms of COVID 19. If you develop any symptoms (ie: fever, flu-like symptoms, shortness of breath, cough etc.) before then, please call [phone].  If you test positive for Covid 19 in the 2 weeks post procedure, please call and report this information to [place].    If any biopsies were taken you will be contacted by phone or by letter within the next 1-3 weeks.  Please call us at [phone] if you have not heard about the biopsies in 3 weeks.    SIGNATURES/CONFIDENTIALITY: You and/or your care partner have signed paperwork which will be entered into your electronic medical record.  These signatures attest to the fact that that the information above on your After Visit Summary has been reviewed and is understood.  Full responsibility of the confidentiality of this discharge information lies with you and/or your care-partner.

## 2020-12-09 NOTE — Progress Notes (Signed)
VS completed by CW.  Medical and surgical history reviewed and updated.  

## 2020-12-09 NOTE — Progress Notes (Signed)
Sedate, gd SR, tolerated procedure well, VSS, report to RN 

## 2020-12-09 NOTE — Op Note (Signed)
[facility] Patient Name: Brenda Becker Procedure Date: 12/09/2020 8:30 AM MRN: [id number] Endoscopist: Jackquline Denmark , MD Age: 50 Referring MD:  Date of Birth: 1970-07-06 Gender: Female Account #: [id number] Procedure:                Colonoscopy Indications:              High risk colon cancer surveillance: Personal H/O                            transverse colon cancer s/p colectomy 2016 Medicines:                Monitored Anesthesia Care Procedure:                Pre-Anesthesia Assessment:                           - Prior to the procedure, a History and Physical                            was performed, and patient medications and                            allergies were reviewed. The patient's tolerance of                            previous anesthesia was also reviewed. The risks                            and benefits of the procedure and the sedation                            options and risks were discussed with the patient.                            All questions were answered, and informed consent                            was obtained. Prior Anticoagulants: The patient has                            taken no previous anticoagulant or antiplatelet                            agents. ASA Grade Assessment: II - A patient with                            mild systemic disease. After reviewing the risks                            and benefits, the patient was deemed in                            satisfactory condition to undergo the procedure.  After obtaining informed consent, the colonoscope                            was passed under direct vision. Throughout the                            procedure, the patient's blood pressure, pulse, and                            oxygen saturations were monitored continuously. The                            Olympus CF-HQ190L ([id number]) Colonoscope was                            introduced through the  anus and advanced to the 2                            cm into the ileum. The colonoscopy was performed                            without difficulty. The patient tolerated the                            procedure well. The quality of the bowel                            preparation was good. The terminal ileum, ileocecal                            valve, appendiceal orifice, and rectum were                            photographed. Scope In: 8:36:03 AM Scope Out: 8:46:24 AM Scope Withdrawal Time: 0 hours 7 minutes 18 seconds  Total Procedure Duration: 0 hours 10 minutes 21 seconds  Findings:                 There was evidence of a prior end-to-end                            colo-colonic anastomosis in the transverse colon.                            This was patent and was characterized by healthy                            appearing mucosa.                           Many small-mouthed diverticula were found in the                            sigmoid colon.  Non-bleeding internal hemorrhoids were found during                            retroflexion. The hemorrhoids were small and Grade                            I (internal hemorrhoids that do not prolapse).                           The terminal ileum appeared normal.                           The exam was otherwise without abnormality on                            direct and retroflexion views. Complications:            No immediate complications. Estimated Blood Loss:     Estimated blood loss: none. Impression:               - Patent end-to-end colo-colonic anastomosis,                            characterized by healthy appearing mucosa. No local                            recurrence                           -Mild sigmoid diverticulosis.                           - Non-bleeding internal hemorrhoids.                           - The examined portion of the ileum was normal.                           - The  examination was otherwise normal on direct                            and retroflexion views.                           - No specimens collected. Recommendation:           - Patient has a contact number available for                            emergencies. The signs and symptoms of potential                            delayed complications were discussed with the                            patient. Return to normal activities tomorrow.  Written discharge instructions were provided to the                            patient.                           - Resume previous diet.                           - Continue present medications.                           - Repeat colon in 5 years. Earlier, if with any new                            problems                           - The findings and recommendations were discussed                            with the patient's family. Jackquline Denmark, MD 12/09/2020 8:53:12 AM This report has been signed electronically.

## 2020-12-13 ENCOUNTER — Telehealth: Payer: Self-pay

## 2020-12-13 ENCOUNTER — Other Ambulatory Visit: Payer: Self-pay | Admitting: Family Medicine

## 2020-12-13 NOTE — Telephone Encounter (Signed)
  Follow up Call-  Call back number 12/09/2020  Post procedure Call Back phone  # [phone]  Permission to leave phone message Yes  Some recent data might be hidden     Patient questions:  Do you have a fever, pain , or abdominal swelling? No. Pain Score  0 *  Have you tolerated food without any problems? Yes.    Have you been able to return to your normal activities? No.  Do you have any questions about your discharge instructions: Diet   No. Medications  No. Follow up visit  No.  Do you have questions or concerns about your Care? No.  Actions: * If pain score is 4 or above: No action needed, pain <4.  Have you developed a fever since your procedure? no  2.   Have you had an respiratory symptoms (SOB or cough) since your procedure? no  3.   Have you tested positive for COVID 19 since your procedure no  4.   Have you had any family members/close contacts diagnosed with the COVID 19 since your procedure?  no   If yes to any of these questions please route to Joylene John, RN and Joella Prince, RN

## 2020-12-16 ENCOUNTER — Encounter: Payer: Self-pay | Admitting: Family Medicine

## 2020-12-16 DIAGNOSIS — E782 Mixed hyperlipidemia: Secondary | ICD-10-CM | POA: Insufficient documentation

## 2020-12-16 NOTE — Progress Notes (Signed)
Subjective:  Patient ID: Brenda Becker, female    DOB: 1970-09-13  Age: 50 y.o. MRN: [id number]  Chief Complaint  Patient presents with   Hyperlipidemia   GERD    Hyperlipidemia: Presents for follow-up.  Patient restarted Crestor however did not tolerate it.  She had severe muscle pain within 2 days.  Patient is also intolerant to Lipitor in the past.  She currently takes Zetia 10 mg once daily.She does take over-the-counter fish oil.  GERD: Well-controlled on omeprazole 40 mg once daily  Colon Cancer: Followed by oncology. 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    Current Outpatient Medications on File Prior to Visit  Medication Sig Dispense Refill   ezetimibe (ZETIA) 10 MG tablet TAKE ONE TABLET BY MOUTH EVERY DAY 30 tablet 0   hydrocortisone 2.5 % cream Apply topically as needed.     Omega-3 Fatty Acids (FISH OIL PO) Take 1,080 mg by mouth at bedtime.     omeprazole (PRILOSEC) 40 MG capsule Take 1 capsule (40 mg total) by mouth daily. 30 capsule 3   Turmeric 500 MG TABS Take 2 tablets by mouth daily.     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 Milford Cage [facility])   Mixed hyperlipidemia    Myalgia due to statin 09/12/2020   Peripheral neuropathy due to chemotherapy ([place])    06-14-2020  per pt right arm to fingers   PONV (postoperative nausea and vomiting)    vomit with Gallbladder surgery   Port catheter in place 08/20/2015   Vitamin D deficiency    Wears glasses    Past Surgical History:  Procedure Laterality Date   BREAST BIOPSY Right 2019   per pt benign   COLON SURGERY  11/16/2014   _0 ---  partial transverse colectomy w/ node dissection   COLONOSCOPY  10/19/2014   Colonic mass (biopsied and tattooed) Small internal hemorrhoids.   DILATATION & CURETTAGE/HYSTEROSCOPY WITH MYOSURE N/A 06/16/2020   Procedure: DILATATION & CURETTAGE/HYSTEROSCOPY WITH MYOSURE EXCISION;  Surgeon: [name], MD;  Location: [facility];  Service: Gynecology;  Laterality: N/A;   EUS N/A 02/17/2016   Procedure: UPPER ENDOSCOPIC ULTRASOUND (EUS) RADIAL;  Surgeon: Milus Banister, MD;  Location: [facility];  Service: Endoscopy;  Laterality: N/A;   EUS N/A 07/20/2016   Procedure: UPPER ENDOSCOPIC ULTRASOUND (EUS) RADIAL;  Surgeon: Milus Banister, MD;  Location: [facility];  Service: Endoscopy;  Laterality: N/A;   KNEE ARTHROSCOPY Right 2012   LAPAROSCOPIC CHOLECYSTECTOMY  2003  PORTA CATH INSERTION  12/22/2014   TONSILLECTOMY  1994    Family History  Problem Relation Age of Onset   Other Father        doesn't go to doctor or get colonoscopies   Other Brother        stomach issues; has had early colonoscopy   Other Maternal Aunt        won't go to doctor; probably has not had colonoscopy   Cervical cancer Paternal Aunt 63       treated at [facility] cancer Paternal Aunt 57       smoker   Skin cancer Maternal Grandmother        non-melanoma; dx. late 60s   Prostate cancer Maternal Grandfather        dx. late 41s   Breast  cancer Paternal Grandmother        dx. 47s   Cancer Cousin        "some kind of stomach cancer" dx. early 35s   Stomach cancer Other        dx. "older"   Colon cancer Other        dx. 77s   Cancer Other        "female cancer" dx. "later age";    Breast cancer Other        dx. 56s-60s   Ulcerative colitis Neg Hx    Social History   Socioeconomic History   Marital status: Married    Spouse name: Not on file   Number of children: 1   Years of education: Not on file   Highest education level: Not on file  Occupational History   Occupation: Freight forwarder    Comment: Health and safety inspector  Tobacco Use   Smoking status: Never   Smokeless tobacco: Never  Vaping Use   Vaping Use: Never used  Substance and Sexual Activity   Alcohol use: No   Drug use: Never   Sexual activity: Not Currently    Partners: Male    Birth control/protection: Abstinence    Comment: MARRIED- 50 YRS  Other Topics Concern   Not on file  Social History Narrative   Not on file   Social Determinants of Health   Financial Resource Strain: Not on file  Food Insecurity: Not on file  Transportation Needs: Not on file  Physical Activity: Not on file  Stress: Not on file  Social Connections: Not on file    Review of Systems  Constitutional:  Negative for chills, fatigue and fever.  HENT:  Negative for congestion, ear pain and sore throat.   Respiratory:  Negative for cough and shortness of breath.   Cardiovascular:  Negative for chest pain and palpitations.  Gastrointestinal:  Negative for abdominal pain, constipation, diarrhea, nausea and vomiting.  Endocrine: Negative for polydipsia, polyphagia and polyuria.  Genitourinary:  Negative for difficulty urinating and dysuria.  Musculoskeletal:  Negative for arthralgias, back pain and myalgias.  Skin:  Negative for rash.  Neurological:  Negative for headaches.  Psychiatric/Behavioral:  Negative for dysphoric mood. The patient is not nervous/anxious.      Objective:  BP (!) 148/92    Pulse 72    Temp 98.7 F (37.1 C)    Resp 16    Ht _0  (1.626 m)    Wt 224 lb (101.6 kg)    BMI 38.45 kg/m   BP/Weight 12/17/2020 12/09/2020 45/40/9811  Systolic BP 914 782 956  Diastolic BP 92 94 96  Wt. (Lbs)  224 223 223.5  BMI 38.45 38.28 37.48    Physical Exam Vitals reviewed.  Constitutional:      Appearance: Normal appearance. She is normal weight.  Neck:     Vascular: No carotid bruit.  Cardiovascular:     Rate and Rhythm: Normal rate and regular rhythm.     Heart sounds: Normal heart sounds.  Pulmonary:     Effort: Pulmonary effort is normal. No respiratory distress.     Breath sounds: Normal breath sounds.  Abdominal:     General: Abdomen is flat. Bowel sounds are normal.     Palpations: Abdomen is soft.     Tenderness: There is no abdominal tenderness.  Neurological:     Mental Status: She is alert and oriented to person, place, and time.  Psychiatric:        Mood and Affect: Mood normal.        Behavior: Behavior normal.    Diabetic Foot Exam - Simple   No data filed      Lab Results  Component Value Date   WBC 5.8 12/17/2020   HGB 13.1 12/17/2020   HCT 41.9 12/17/2020   PLT 260 12/17/2020   GLUCOSE 97 12/17/2020   CHOL 230 (H) 12/17/2020   TRIG 125 12/17/2020   HDL 43 12/17/2020   LDLCALC 164 (H) 12/17/2020   ALT 16 12/17/2020   AST 17 12/17/2020   NA 142 12/17/2020   K 4.4 12/17/2020   CL 103 12/17/2020   CREATININE 1.15 (H) 12/17/2020   BUN 13 12/17/2020   CO2 24 12/17/2020   TSH 4.270 12/17/2020   INR 0.94 12/22/2014      Assessment & Plan:   Problem List Items Addressed This Visit       Cardiovascular and Mediastinum   Atherosclerosis of aorta ([place]) - Primary    Plan to try to get pt repatha.         Digestive   Malignant neoplasm of colon ([place])    Continue to follow up with oncology.      GERD without esophagitis    The current medical regimen is effective;  continue present plan and  medications.        Other   Mixed hyperlipidemia    Poorly controlled.  Recommend repatha Continue to work on eating a healthy diet and exercise.  Labs drawn today.       Relevant Orders   Comprehensive metabolic panel (Completed)   Lipid panel (Completed)   CBC with Differential/Platelet (Completed)   TSH (Completed)   Class 2 severe obesity due to excess calories with serious comorbidity and body mass index (BMI) of 38.0 to 38.9 in adult [facility])    Recommend continue to work on eating healthy diet and exercise.      .  Orders Placed This Encounter  Procedures   Comprehensive metabolic panel   Lipid panel   CBC with Differential/Platelet   TSH   Cardiovascular Risk Assessment     Follow-up: Return in about 3 months (around 03/17/2021) for chronic fasting.  An After Visit Summary was printed and given to the patient.  I,Lauren M Auman,acting as a scribe for Rochel Brome, MD.,have documented all relevant documentation on the behalf of Rochel Brome, MD,as directed by  Rochel Brome, MD while in the presence of Rochel Brome, MD.    Rochel Brome, MD [place] [phone]

## 2020-12-17 ENCOUNTER — Ambulatory Visit (INDEPENDENT_AMBULATORY_CARE_PROVIDER_SITE_OTHER): Payer: 59 | Admitting: Family Medicine

## 2020-12-17 ENCOUNTER — Other Ambulatory Visit: Payer: Self-pay

## 2020-12-17 ENCOUNTER — Ambulatory Visit (INDEPENDENT_AMBULATORY_CARE_PROVIDER_SITE_OTHER): Payer: 59

## 2020-12-17 VITALS — BP 148/92 | HR 72 | Temp 98.7°F | Resp 16 | Ht 64.0 in | Wt 224.0 lb

## 2020-12-17 DIAGNOSIS — Z23 Encounter for immunization: Secondary | ICD-10-CM | POA: Diagnosis not present

## 2020-12-17 DIAGNOSIS — I700 Atherosclerosis of aorta: Secondary | ICD-10-CM | POA: Diagnosis not present

## 2020-12-17 DIAGNOSIS — K219 Gastro-esophageal reflux disease without esophagitis: Secondary | ICD-10-CM

## 2020-12-17 DIAGNOSIS — Z6838 Body mass index (BMI) 38.0-38.9, adult: Secondary | ICD-10-CM

## 2020-12-17 DIAGNOSIS — E782 Mixed hyperlipidemia: Secondary | ICD-10-CM

## 2020-12-17 DIAGNOSIS — C189 Malignant neoplasm of colon, unspecified: Secondary | ICD-10-CM | POA: Diagnosis not present

## 2020-12-17 NOTE — Assessment & Plan Note (Signed)
Plan to try to get pt repatha.

## 2020-12-18 ENCOUNTER — Encounter: Payer: Self-pay | Admitting: Oncology

## 2020-12-18 LAB — COMPREHENSIVE METABOLIC PANEL
ALT: 16 IU/L (ref 0–32)
AST: 17 IU/L (ref 0–40)
Albumin/Globulin Ratio: 1.7 (ref 1.2–2.2)
Albumin: 4.7 g/dL (ref 3.8–4.8)
Alkaline Phosphatase: 80 IU/L (ref 44–121)
BUN/Creatinine Ratio: 11 (ref 9–23)
BUN: 13 mg/dL (ref 6–24)
Bilirubin Total: 0.3 mg/dL (ref 0.0–1.2)
CO2: 24 mmol/L (ref 20–29)
Calcium: 9.9 mg/dL (ref 8.7–10.2)
Chloride: 103 mmol/L (ref 96–106)
Creatinine, Ser: 1.15 mg/dL — ABNORMAL HIGH (ref 0.57–1.00)
Globulin, Total: 2.7 g/dL (ref 1.5–4.5)
Glucose: 97 mg/dL (ref 70–99)
Potassium: 4.4 mmol/L (ref 3.5–5.2)
Sodium: 142 mmol/L (ref 134–144)
Total Protein: 7.4 g/dL (ref 6.0–8.5)
eGFR: 58 mL/min/{1.73_m2} — ABNORMAL LOW (ref >59)

## 2020-12-18 LAB — CBC WITH DIFFERENTIAL/PLATELET
Basophils Absolute: 0.1 10*3/uL (ref 0.0–0.2)
Basos: 1 %
EOS (ABSOLUTE): 0.4 10*3/uL (ref 0.0–0.4)
Eos: 6 %
Hematocrit: 41.9 % (ref 34.0–46.6)
Hemoglobin: 13.1 g/dL (ref 11.1–15.9)
Immature Grans (Abs): 0 10*3/uL (ref 0.0–0.1)
Immature Granulocytes: 0 %
Lymphocytes Absolute: 2.1 10*3/uL (ref 0.7–3.1)
Lymphs: 37 %
MCH: 24.5 pg — ABNORMAL LOW (ref 26.6–33.0)
MCHC: 31.3 g/dL — ABNORMAL LOW (ref 31.5–35.7)
MCV: 79 fL (ref 79–97)
Monocytes Absolute: 0.6 10*3/uL (ref 0.1–0.9)
Monocytes: 10 %
Neutrophils Absolute: 2.7 10*3/uL (ref 1.4–7.0)
Neutrophils: 46 %
Platelets: 260 10*3/uL (ref 150–450)
RBC: 5.34 x10E6/uL — ABNORMAL HIGH (ref 3.77–5.28)
RDW: 15.4 % (ref 11.7–15.4)
WBC: 5.8 10*3/uL (ref 3.4–10.8)

## 2020-12-18 LAB — LIPID PANEL
Chol/HDL Ratio: 5.3 ratio — ABNORMAL HIGH (ref 0.0–4.4)
Cholesterol, Total: 230 mg/dL — ABNORMAL HIGH (ref 100–199)
HDL: 43 mg/dL (ref >39)
LDL Chol Calc (NIH): 164 mg/dL — ABNORMAL HIGH (ref 0–99)
Triglycerides: 125 mg/dL (ref 0–149)
VLDL Cholesterol Cal: 23 mg/dL (ref 5–40)

## 2020-12-18 LAB — TSH: TSH: 4.27 u[IU]/mL (ref 0.450–4.500)

## 2020-12-19 NOTE — Assessment & Plan Note (Signed)
The current medical regimen is effective;  continue present plan and medications.  

## 2020-12-19 NOTE — Progress Notes (Signed)
Blood count normal.  Liver function normal.  Kidney function abnormal, but stable Thyroid function normal.  Cholesterol: too high. Low fat diet and exercise. Weight loss recommended.  Recommend repatha as we discussed. Failed on crestor and lipitor. Has aortic atherosclerosis.

## 2020-12-19 NOTE — Assessment & Plan Note (Addendum)
Poorly controlled.  Recommend repatha Continue to work on eating a healthy diet and exercise.  Labs drawn today.

## 2020-12-20 ENCOUNTER — Other Ambulatory Visit: Payer: Self-pay | Admitting: Family Medicine

## 2020-12-20 MED ORDER — REPATHA 140 MG/ML ~~LOC~~ SOSY: 140.0000 mg | PREFILLED_SYRINGE | SUBCUTANEOUS | 2 refills | Status: DC

## 2020-12-20 NOTE — Progress Notes (Signed)
Sent  kc

## 2021-01-02 ENCOUNTER — Encounter: Payer: Self-pay | Admitting: Family Medicine

## 2021-01-02 DIAGNOSIS — Z6837 Body mass index (BMI) 37.0-37.9, adult: Secondary | ICD-10-CM | POA: Insufficient documentation

## 2021-01-02 NOTE — Assessment & Plan Note (Signed)
Recommend continue to work on eating healthy diet and exercise.  

## 2021-01-02 NOTE — Assessment & Plan Note (Signed)
Continue to follow up with oncology.

## 2021-01-18 ENCOUNTER — Other Ambulatory Visit: Payer: Self-pay | Admitting: Family Medicine

## 2021-03-30 ENCOUNTER — Other Ambulatory Visit: Payer: Self-pay

## 2021-03-30 ENCOUNTER — Ambulatory Visit: Payer: 59 | Admitting: Family Medicine

## 2021-03-30 VITALS — BP 140/86 | HR 70 | Temp 97.1°F | Resp 16 | Ht 65.0 in | Wt 223.0 lb

## 2021-03-30 DIAGNOSIS — C189 Malignant neoplasm of colon, unspecified: Secondary | ICD-10-CM

## 2021-03-30 DIAGNOSIS — I129 Hypertensive chronic kidney disease with stage 1 through stage 4 chronic kidney disease, or unspecified chronic kidney disease: Secondary | ICD-10-CM | POA: Diagnosis not present

## 2021-03-30 DIAGNOSIS — E782 Mixed hyperlipidemia: Secondary | ICD-10-CM | POA: Diagnosis not present

## 2021-03-30 DIAGNOSIS — I700 Atherosclerosis of aorta: Secondary | ICD-10-CM

## 2021-03-30 DIAGNOSIS — Z1379 Encounter for other screening for genetic and chromosomal anomalies: Secondary | ICD-10-CM

## 2021-03-30 DIAGNOSIS — G620 Drug-induced polyneuropathy: Secondary | ICD-10-CM

## 2021-03-30 DIAGNOSIS — N1831 Chronic kidney disease, stage 3a: Secondary | ICD-10-CM | POA: Insufficient documentation

## 2021-03-30 DIAGNOSIS — C184 Malignant neoplasm of transverse colon: Secondary | ICD-10-CM

## 2021-03-30 DIAGNOSIS — D508 Other iron deficiency anemias: Secondary | ICD-10-CM | POA: Diagnosis not present

## 2021-03-30 DIAGNOSIS — C772 Secondary and unspecified malignant neoplasm of intra-abdominal lymph nodes: Secondary | ICD-10-CM

## 2021-03-30 DIAGNOSIS — Z6837 Body mass index (BMI) 37.0-37.9, adult: Secondary | ICD-10-CM

## 2021-03-30 DIAGNOSIS — D509 Iron deficiency anemia, unspecified: Secondary | ICD-10-CM | POA: Insufficient documentation

## 2021-03-30 DIAGNOSIS — K219 Gastro-esophageal reflux disease without esophagitis: Secondary | ICD-10-CM

## 2021-03-30 DIAGNOSIS — I10 Essential (primary) hypertension: Secondary | ICD-10-CM | POA: Insufficient documentation

## 2021-03-30 DIAGNOSIS — F5101 Primary insomnia: Secondary | ICD-10-CM | POA: Insufficient documentation

## 2021-03-30 MED ORDER — FAMOTIDINE 40 MG PO TABS: 40.0000 mg | ORAL_TABLET | Freq: Every day | ORAL | 0 refills | Status: DC

## 2021-03-30 MED ORDER — LOSARTAN POTASSIUM 50 MG PO TABS: 50.0000 mg | ORAL_TABLET | Freq: Every day | ORAL | 0 refills | Status: DC

## 2021-03-30 NOTE — Assessment & Plan Note (Signed)
Follow up with oncology

## 2021-03-30 NOTE — Assessment & Plan Note (Signed)
No meds needed  

## 2021-03-30 NOTE — Assessment & Plan Note (Signed)
Poorly controlled. Will try to get repatha approved. Patient has aortic atherosclerosis found on imaging.  ?Continue to work on eating a healthy diet and exercise.  ?Labs drawn today.  ? ?

## 2021-03-30 NOTE — Assessment & Plan Note (Signed)
Change omeprazole to pepcid 40 mg daily.  ?

## 2021-03-30 NOTE — Assessment & Plan Note (Signed)
Poorly controlled. Will try to get repatha approved. Patient has aortic atherosclerosis found on imaging.  ?Continue to work on eating a healthy diet and exercise.  ?Labs drawn today.  ?

## 2021-03-30 NOTE — Progress Notes (Signed)
? ?Subjective:  ?Patient ID: Brenda Becker, female    DOB: May 22, 1970  Age: 51 y.o. MRN: [id number] ? ?Chief Complaint  ?Patient presents with  ? Hyperlipidemia  ? Gastroesophageal Reflux  ? ? ?HPI ?Hyperlipidemia: not on any medicines except otc fish oil 2 daily. Intolerant to statins. Repatha has not been approved despite patient having aortic atherosclerosis.  ? ?GERD: omeprazole 40 mg once daily.  ? ?Eating healthy. Exercising.  ?Poor sleep: for few months.  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  ? ?BP is up today. It has been up at home per patient. 162/100. ? 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 Sees oncology. Follows with imagining and CEA levels. Colonoscopy done in 12/2020. Due in 2028.  ? ?Current Outpatient Medications on File Prior to Visit  ?Medication Sig Dispense Refill  ? ferrous sulfate 325 (65 FE) MG tablet Take by mouth every other day.    ? Omega-3 Fatty Acids (FISH OIL PO) Take 1,080 mg by mouth at bedtime.    ? omeprazole (PRILOSEC) 40 MG capsule Take 1 capsule (40 mg total) by mouth daily. 30 capsule 3  ? Turmeric 500 MG TABS Take 2 tablets by mouth daily.    ? ?Current Facility-Administered Medications on File Prior to Visit  ?Medication Dose Route Frequency Provider Last Rate Last Admin  ? sodium chloride flush (NS) 0.9 % injection 10 mL  10 mL Intravenous PRN Ladell Pier, MD   10 mL at 05/28/15 0956  ? ?Past Medical History:  ?Diagnosis Date  ? Abnormal uterine bleeding (AUB)   ? CKD (chronic kidney disease), stage II   ? Endometrial polyp   ? Familial hyperlipidemia, high LDL 04/07/2019  ? Family history of cancer 01/25/2015  ? GAD (generalized anxiety disorder)   ? GERD (gastroesophageal reflux disease)   ? Hepatitis 02/26/2017  ? History of hepatitis 04/2017  ? IMMUNOTHERAPY INDUCED HEPATITIS,  TREATED  ? Hypomagnesemia   ? Malignant neoplasm of transverse colon ([place]) 2016  ? dx 2016,  Stage III--IV  MSS KRAS wild-type transverse w/ lymph  node mets;  11-16-2014  partial transerve colectomy w/ node dissection @ [facility];  started chemo 12-30-2014;   followed by dr [name] ([facility]) and Dr h. [place] Cage [facility])  ? Mixed hyperlipidemia   ? Myalgia due to statin 09/12/2020  ? Peripheral neuropathy due to chemotherapy [facility])   ? 06-14-2020  per pt right arm to fingers  ? PONV (postoperative nausea and vomiting)   ? vomit with Gallbladder surgery  ? Port catheter in place 08/20/2015  ? Vitamin D deficiency   ? Wears glasses   ? ?Past Surgical History:  ?Procedure Laterality Date  ? BREAST BIOPSY Right 2019  ? per pt benign  ? COLON SURGERY  11/16/2014  ? _0 ---  partial transverse colectomy w/ node dissection  ? COLONOSCOPY  10/19/2014  ? Colonic mass (biopsied and tattooed) Small internal hemorrhoids.  ? DILATATION & CURETTAGE/HYSTEROSCOPY WITH MYOSURE N/A 06/16/2020  ? Procedure: DILATATION & CURETTAGE/HYSTEROSCOPY WITH MYOSURE EXCISION;  Surgeon: [name], MD;  Location: [place];  Service: Gynecology;  Laterality: N/A;  ? EUS N/A 02/17/2016  ? Procedure: UPPER ENDOSCOPIC ULTRASOUND (EUS) RADIAL;  Surgeon: Milus Banister, MD;  [facility];  Service: Endoscopy;  Laterality: N/A;  ? EUS N/A 07/20/2016  ? Procedure: UPPER ENDOSCOPIC ULTRASOUND (EUS) RADIAL;  Surgeon: Milus Banister, MD;  [facility];  Service: Endoscopy;  Laterality: N/A;  ?  KNEE ARTHROSCOPY Right 2012  ? LAPAROSCOPIC CHOLECYSTECTOMY  2003  ? PORTA CATH INSERTION  12/22/2014  ? TONSILLECTOMY  1994  ?  ?Family History  ?Problem Relation Age of Onset  ? Other Father   ?     doesn't go to doctor or get colonoscopies  ? Other Brother   ?     stomach issues; has had early colonoscopy  ? Other Maternal Aunt   ?     won't go to doctor; probably has not had colonoscopy  ? Cervical cancer Paternal Aunt 10  ?     treated at [facility]  ? Throat cancer Paternal Aunt 73  ?     smoker  ? Skin cancer Maternal Grandmother   ?      non-melanoma; dx. late 60s  ? Prostate cancer Maternal Grandfather   ?     dx. late 45s  ? Breast cancer Paternal Grandmother   ?     dx. 54s  ? Cancer Cousin   ?     "some kind of stomach cancer" dx. early 26s  ? Stomach cancer Other   ?     dx. "older"  ? Colon cancer Other   ?     dx. 37s  ? Cancer Other   ?     "female cancer" dx. "later age";   ? Breast cancer Other   ?     dx. 50s-60s  ? Ulcerative colitis Neg Hx   ? ?Social History  ? ?Socioeconomic History  ? Marital status: Married  ?  Spouse name: Not on file  ? Number of children: 1  ? Years of education: Not on file  ? Highest education level: Not on file  ?Occupational History  ? Occupation: Freight forwarder  ?  Comment: Health and safety inspector  ?Tobacco Use  ? Smoking status: Never  ? Smokeless tobacco: Never  ?Vaping Use  ? Vaping Use: Never used  ?Substance and Sexual Activity  ? Alcohol use: No  ? Drug use: Never  ? Sexual activity: Not Currently  ?  Partners: Male  ?  Birth control/protection: Abstinence  ?  Comment: MARRIED- 19 YRS  ?Other Topics Concern  ? Not on file  ?Social History Narrative  ? Not on file  ? ?Social Determinants of Health  ? ?Financial Resource Strain: Not on file  ?Food Insecurity: Not on file  ?Transportation Needs: Not on file  ?Physical Activity: Not on file  ?Stress: Not on file  ?Social Connections: Not on file  ? ? ?Review of Systems  ?Constitutional:  Negative for chills, fatigue and fever.  ?HENT:  Negative for congestion, rhinorrhea and sore throat.   ?Respiratory:  Negative for cough and shortness of breath.   ?Cardiovascular:  Negative for chest pain and leg swelling.  ?Gastrointestinal:  Negative for abdominal pain, constipation, diarrhea, nausea and vomiting.  ?Genitourinary:  Negative for dysuria and urgency.  ?Musculoskeletal:  Positive for myalgias. Negative for back pain.  ?Neurological:  Positive for headaches. Negative for dizziness, weakness and light-headedness.  ?Psychiatric/Behavioral:  Negative for dysphoric mood.  The patient is not nervous/anxious.   ? ? ?Objective:  ?BP 140/86   Pulse 70   Temp (!) 97.1 ?F (36.2 ?C)   Resp 16   Ht _0  (1.651 m)   Wt 223 lb (101.2 kg)   BMI 37.11 kg/m?  ? ? ?  03/30/2021  ?  8:15 AM 12/17/2020  ?  8:38 AM 12/17/2020  ?  8:13 AM  ?  BP/Weight  ?Systolic BP 194 712 527  ?Diastolic BP 86 92 129  ?Wt. (Lbs) 223  224  ?BMI 37.11 kg/m2  38.45 kg/m2  ? ? ?Physical Exam ?Vitals reviewed.  ?Constitutional:   ?   Appearance: Normal appearance. She is obese.  ?Neck:  ?   Vascular: No carotid bruit.  ?Cardiovascular:  ?   Rate and Rhythm: Normal rate and regular rhythm.  ?   Heart sounds: Normal heart sounds.  ?Pulmonary:  ?   Effort: Pulmonary effort is normal. No respiratory distress.  ?   Breath sounds: Normal breath sounds.  ?Abdominal:  ?   General: Abdomen is flat. Bowel sounds are normal.  ?   Palpations: Abdomen is soft.  ?   Tenderness: There is no abdominal tenderness.  ?Neurological:  ?   Mental Status: She is alert and oriented to person, place, and time.  ?Psychiatric:     ?   Mood and Affect: Mood normal.     ?   Behavior: Behavior normal.  ? ? ?Diabetic Foot Exam - Simple   ?No data filed ?  ?  ? ?Lab Results  ?Component Value Date  ? WBC 6.0 03/30/2021  ? HGB 14.5 03/30/2021  ? HCT 43.1 03/30/2021  ? PLT 239 03/30/2021  ? GLUCOSE 103 (H) 03/30/2021  ? CHOL 296 (H) 03/30/2021  ? TRIG 89 03/30/2021  ? HDL 42 03/30/2021  ? LDLCALC 239 (H) 03/30/2021  ? ALT 21 03/30/2021  ? AST 22 03/30/2021  ? NA 146 (H) 03/30/2021  ? K 4.7 03/30/2021  ? CL 105 03/30/2021  ? CREATININE 1.14 (H) 03/30/2021  ? BUN 13 03/30/2021  ? CO2 25 03/30/2021  ? TSH 4.270 12/17/2020  ? INR 0.94 12/22/2014  ? ? ? ? ?Assessment & Plan:  ? ?Problem List Items Addressed This Visit   ? ?  ? Cardiovascular and Mediastinum  ? Primary hypertension  ?  Fair controlled.  ?Start losartan 50 mg once daily for hypertension. ?Continue to work on eating a healthy diet and exercise.  ?Labs drawn today.  ?  ?  ? Relevant  Medications  ? losartan (COZAAR) 50 MG tablet  ? Atherosclerosis of aorta ([place])  ?  Poorly controlled. Will try to get repatha approved. Patient has aortic atherosclerosis found on imaging.  ?Continue to work on eating a healthy di

## 2021-03-30 NOTE — Assessment & Plan Note (Signed)
Comorbitides: hyperlipidemia and gerd.  ?Patient to check if Mancel Parsons is covered by insurance. ?Patient previously tried phentermine, but did not help.  ?

## 2021-03-30 NOTE — Assessment & Plan Note (Signed)
Management per specialist.  ?Colonoscopy due in 2028.  ?

## 2021-03-30 NOTE — Patient Instructions (Signed)
WEGOVY - check with insurance.  ? ?Change omeprazole to famotidine (pepcid) 40 mg once daily  ? ?Start losartan 50 mg once daily for hypertension. ?

## 2021-03-31 ENCOUNTER — Encounter: Payer: Self-pay | Admitting: Oncology

## 2021-03-31 LAB — IRON,TIBC AND FERRITIN PANEL
Ferritin: 51 ng/mL (ref 15–150)
Iron Saturation: 12 % — ABNORMAL LOW (ref 15–55)
Iron: 45 ug/dL (ref 27–159)
Total Iron Binding Capacity: 362 ug/dL (ref 250–450)
UIBC: 317 ug/dL (ref 131–425)

## 2021-03-31 LAB — LIPID PANEL
Chol/HDL Ratio: 7 ratio — ABNORMAL HIGH (ref 0.0–4.4)
Cholesterol, Total: 296 mg/dL — ABNORMAL HIGH (ref 100–199)
HDL: 42 mg/dL (ref >39)
LDL Chol Calc (NIH): 239 mg/dL — ABNORMAL HIGH (ref 0–99)
Triglycerides: 89 mg/dL (ref 0–149)
VLDL Cholesterol Cal: 15 mg/dL (ref 5–40)

## 2021-03-31 LAB — CBC WITH DIFFERENTIAL/PLATELET
Basophils Absolute: 0.1 10*3/uL (ref 0.0–0.2)
Basos: 2 %
EOS (ABSOLUTE): 0.3 10*3/uL (ref 0.0–0.4)
Eos: 5 %
Hematocrit: 43.1 % (ref 34.0–46.6)
Hemoglobin: 14.5 g/dL (ref 11.1–15.9)
Immature Grans (Abs): 0 10*3/uL (ref 0.0–0.1)
Immature Granulocytes: 0 %
Lymphocytes Absolute: 2.2 10*3/uL (ref 0.7–3.1)
Lymphs: 36 %
MCH: 26.7 pg (ref 26.6–33.0)
MCHC: 33.6 g/dL (ref 31.5–35.7)
MCV: 79 fL (ref 79–97)
Monocytes Absolute: 0.5 10*3/uL (ref 0.1–0.9)
Monocytes: 8 %
Neutrophils Absolute: 3 10*3/uL (ref 1.4–7.0)
Neutrophils: 49 %
Platelets: 239 10*3/uL (ref 150–450)
RBC: 5.43 x10E6/uL — ABNORMAL HIGH (ref 3.77–5.28)
RDW: 16 % — ABNORMAL HIGH (ref 11.7–15.4)
WBC: 6 10*3/uL (ref 3.4–10.8)

## 2021-03-31 LAB — COMPREHENSIVE METABOLIC PANEL
ALT: 21 IU/L (ref 0–32)
AST: 22 IU/L (ref 0–40)
Albumin/Globulin Ratio: 1.6 (ref 1.2–2.2)
Albumin: 4.6 g/dL (ref 3.8–4.8)
Alkaline Phosphatase: 88 IU/L (ref 44–121)
BUN/Creatinine Ratio: 11 (ref 9–23)
BUN: 13 mg/dL (ref 6–24)
Bilirubin Total: 0.3 mg/dL (ref 0.0–1.2)
CO2: 25 mmol/L (ref 20–29)
Calcium: 10.2 mg/dL (ref 8.7–10.2)
Chloride: 105 mmol/L (ref 96–106)
Creatinine, Ser: 1.14 mg/dL — ABNORMAL HIGH (ref 0.57–1.00)
Globulin, Total: 2.8 g/dL (ref 1.5–4.5)
Glucose: 103 mg/dL — ABNORMAL HIGH (ref 70–99)
Potassium: 4.7 mmol/L (ref 3.5–5.2)
Sodium: 146 mmol/L — ABNORMAL HIGH (ref 134–144)
Total Protein: 7.4 g/dL (ref 6.0–8.5)
eGFR: 59 mL/min/{1.73_m2} — ABNORMAL LOW (ref >59)

## 2021-04-01 ENCOUNTER — Telehealth: Payer: Self-pay

## 2021-04-01 NOTE — Telephone Encounter (Signed)
PRIOR AUTH APPROVAL WAS RECEIVED THROUGH PATIENTS INSURANCE FOR FAMOTIDINE. ?

## 2021-04-04 ENCOUNTER — Encounter: Payer: Self-pay | Admitting: Family Medicine

## 2021-04-04 DIAGNOSIS — I700 Atherosclerosis of aorta: Secondary | ICD-10-CM

## 2021-04-04 DIAGNOSIS — E7849 Other hyperlipidemia: Secondary | ICD-10-CM

## 2021-04-04 NOTE — Progress Notes (Signed)
Blood count normal.  ?Liver function normal.  ?Kidney function abnormal. Stable. ?Cholesterol: LDL 239. I submitted an appeal letter for repatha. ?Iron studies good.  ?

## 2021-04-12 NOTE — Assessment & Plan Note (Signed)
Labs drawn today

## 2021-04-12 NOTE — Assessment & Plan Note (Signed)
Fair controlled.  ?Start losartan 50 mg once daily for hypertension. ?Continue to work on eating a healthy diet and exercise.  ?Labs drawn today.  ?

## 2021-04-15 ENCOUNTER — Encounter: Payer: Self-pay | Admitting: Family Medicine

## 2021-05-10 ENCOUNTER — Other Ambulatory Visit: Payer: Self-pay

## 2021-05-10 MED ORDER — REPATHA SURECLICK 140 MG/ML ~~LOC~~ SOAJ: 140.0000 mg | SUBCUTANEOUS | 0 refills | Status: DC

## 2021-05-10 NOTE — Telephone Encounter (Signed)
PATIENT WAS APPROVED FOR REPATHA, CALLED PATIENT AND THE PATIENT'S VOICEMAIL IS FULL AND COULD NOT LEAVE A MESSAGE. SENDING 3 MONTHS WORTH OF REPATHA TO PHARMACY PER DR. COX. ?

## 2021-06-07 ENCOUNTER — Other Ambulatory Visit: Payer: Self-pay | Admitting: Family Medicine

## 2021-07-04 ENCOUNTER — Other Ambulatory Visit: Payer: Self-pay | Admitting: Family Medicine

## 2021-07-06 NOTE — Progress Notes (Signed)
Subjective:  Patient ID: Brenda Becker, female    DOB: [birth year]  Age: 51 y.o. MRN: [id number]  Chief Complaint  Patient presents with   Hyperlipidemia   Hypertension   HPI; Hyperlipidemia: Patient has been taking Repatha for 3 doses.  She is intolerant to other statin medicines.  Currently on fish oil 1000 mg daily.  GERD: omeprazole 40 mg once daily.   Eating healthy. Exercising.   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   Hypertension: Is well controlled with losartan 50 mg daily.  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 Sees oncology. Follows with CT imaging and CEA levels. Colonoscopy done in 12/2020. Due in 2028.    Patient is complaining of cold sores.  She has these recurrently whenever she eats tomatoes or when it is hot outside.  She has used over-the-counter medications.  She does not feel like she needs something every day but would like something that she can initiate treatment if she has a recurrence..  Current Outpatient Medications on File Prior to Visit  Medication Sig Dispense Refill   Evolocumab (REPATHA SURECLICK) 712 MG/ML SOAJ Inject 140 mg into the skin every 14 (fourteen) days. 6 mL 0   famotidine (PEPCID) 40 MG tablet Take 1 tablet (40 mg total) by mouth daily. 90 tablet 0   ferrous sulfate 325 (65 FE) MG tablet Take by mouth every other day.     losartan (COZAAR) 50 MG tablet Take 1 tablet (50 mg total) by mouth daily. 90 tablet 0   Omega-3 Fatty Acids (FISH OIL PO) Take 1,080 mg by mouth at bedtime.     omeprazole (PRILOSEC) 40 MG capsule Take 1 capsule (40 mg total) by mouth daily. 90 capsule 3   Turmeric 500 MG TABS Take 2 tablets by mouth daily.     Current Facility-Administered Medications on File Prior to Visit  Medication Dose Route Frequency Provider Last Rate Last Admin   sodium chloride flush (NS) 0.9 % injection 10 mL  10 mL Intravenous PRN Ladell Pier, MD   10 mL at 05/28/15 0956   Past Medical  History:  Diagnosis Date   Abnormal uterine bleeding (AUB)    CKD (chronic kidney disease), stage II    Endometrial polyp    Familial hyperlipidemia, high LDL 04/07/2019   Family history of cancer 01/25/2015   GAD (generalized anxiety disorder)    GERD (gastroesophageal reflux disease)    Hepatitis 02/26/2017   History of hepatitis 04/2017   IMMUNOTHERAPY INDUCED HEPATITIS,  TREATED   Hypomagnesemia    Malignant neoplasm of transverse colon ([place]) 2016   dx 2016,  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 Mil[place] Cage [facility] center)   Mixed hyperlipidemia    Myalgia due to statin 09/12/2020   Peripheral neuropathy due to chemotherapy ([place])    06-14-2020  per pt right arm to fingers   PONV (postoperative nausea and vomiting)    vomit with Gallbladder surgery   Port catheter in place 08/20/2015   Vitamin D deficiency    Wears glasses    Past Surgical History:  Procedure Laterality Date   BREAST BIOPSY Right 2019   per pt benign   COLON SURGERY  11/16/2014   '@[facility] ---  partial transverse colectomy w/ node dissection   COLONOSCOPY  10/19/2014  Colonic mass (biopsied and tattooed) Small internal hemorrhoids.   DILATATION & CURETTAGE/HYSTEROSCOPY WITH MYOSURE N/A 06/16/2020   Procedure: DILATATION & CURETTAGE/HYSTEROSCOPY WITH MYOSURE EXCISION;  Surgeon: [name], MD;  Location: [place];  Service: Gynecology;  Laterality: N/A;   EUS N/A 02/17/2016   Procedure: UPPER ENDOSCOPIC ULTRASOUND (EUS) RADIAL;  Surgeon: Milus Banister, MD;  [facility];  Service: Endoscopy;  Laterality: N/A;   EUS N/A 07/20/2016   Procedure: UPPER ENDOSCOPIC ULTRASOUND (EUS) RADIAL;  Surgeon: Milus Banister, MD;  [facility];  Service: Endoscopy;  Laterality: N/A;   KNEE ARTHROSCOPY Right  2012   LAPAROSCOPIC CHOLECYSTECTOMY  2003   PORTA CATH INSERTION  12/22/2014   TONSILLECTOMY  1994    Family History  Problem Relation Age of Onset   Other Father        doesn't go to doctor or get colonoscopies   Other Brother        stomach issues; has had early colonoscopy   Other Maternal Aunt        won't go to doctor; probably has not had colonoscopy   Cervical cancer Paternal Aunt 63       treated at [facility] Paternal Aunt 57       smoker   Skin cancer Maternal Grandmother        non-melanoma; dx. late 60s   Prostate cancer Maternal Grandfather        dx. late 29s   Breast cancer Paternal Grandmother        dx. 66s   Cancer Cousin        "some kind of stomach cancer" dx. early 44s   Stomach cancer Other        dx. "older"   Colon cancer Other        dx. 71s   Cancer Other        "female cancer" dx. "later age";    Breast cancer Other        dx. 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  Tobacco Use   Smoking status: Never   Smokeless tobacco: Never  Vaping Use   Vaping Use: Never used  Substance and Sexual Activity   Alcohol use: No   Drug use: Never   Sexual activity: Not Currently    Partners: Male    Birth control/protection: Abstinence    Comment: MARRIED- 9 YRS  Other Topics Concern   Not on file  Social History Narrative   Not on file   Social Determinants of Health   Financial Resource Strain: Not on file  Food Insecurity: Not on file  Transportation Needs: Not on file  Physical Activity: Not on file  Stress: Not on file  Social Connections: Not on file    Review of Systems  Constitutional:  Negative for appetite change, fatigue and fever.  HENT:  Negative for  congestion, ear pain, sinus pressure and sore throat.   Respiratory:  Negative for cough, chest tightness, shortness of breath and wheezing.   Cardiovascular:  Negative for chest pain and palpitations.  Gastrointestinal:  Negative for abdominal pain, constipation, diarrhea, nausea and vomiting.  Genitourinary:  Negative for dysuria and hematuria.  Musculoskeletal:  Negative for arthralgias, back pain, joint swelling and myalgias.  Skin:  Negative for rash.  Neurological:  Negative for dizziness, weakness and headaches.  Psychiatric/Behavioral:  Negative for dysphoric mood. The patient is not nervous/anxious.      Objective:  BP 130/80 (BP Location: Left Arm, Patient Position: Sitting, Cuff Size: Large)   Pulse 74   Temp (!) 97.4 F (36.3 C) (Temporal)   Ht $R'5\' 5"'pV$  (1.651 m)   Wt 220 lb 9.6 oz (100.1 kg)   SpO2 97%   BMI 36.71 kg/m      07/11/2021    7:46 AM 03/30/2021    8:15 AM 12/17/2020    8:38 AM  BP/Weight  Systolic BP 121 975 883  Diastolic BP 80 86 92  Wt. (Lbs) 220.6 223   BMI 36.71 kg/m2 37.11 kg/m2     Physical Exam Vitals reviewed.  Constitutional:      Appearance: Normal appearance. She is normal weight.  HENT:     Mouth/Throat:     Comments: Cold sore left lower lip.  (Has been there for several days) Neck:     Vascular: No carotid bruit.  Cardiovascular:     Rate and Rhythm: Normal rate and regular rhythm.     Heart sounds: Normal heart sounds.  Pulmonary:     Effort: Pulmonary effort is normal.     Breath sounds: Normal breath sounds.  Abdominal:     General: Abdomen is flat. Bowel sounds are normal.     Palpations: Abdomen is soft.     Tenderness: There is no abdominal tenderness.  Neurological:     Mental Status: She is alert and oriented to person, place, and time.  Psychiatric:        Mood and Affect: Mood normal.        Behavior: Behavior normal.   Yes Misty is a 52 [place] for prostate he had a repeat in [place] 3 weeks ago and he when he  sneezes he [place] appointment Thursday continue being seen for him axilla, along with the weight to feel bad as a reason he wanted to wait to have anything open late afternoon  Diabetic Foot Exam - Simple   No data filed      Lab Results  Component Value Date   WBC 6.0 03/30/2021   HGB 14.5 03/30/2021   HCT 43.1 03/30/2021   PLT 239 03/30/2021   GLUCOSE 103 (H) 03/30/2021   CHOL 296 (H) 03/30/2021   TRIG 89 03/30/2021   HDL 42 03/30/2021   LDLCALC 239 (H) 03/30/2021   ALT 21 03/30/2021   AST 22 03/30/2021   NA 146 (H) 03/30/2021   K 4.7 03/30/2021   CL 105 03/30/2021   CREATININE 1.14 (H) 03/30/2021   BUN 13 03/30/2021   CO2 25 03/30/2021   TSH 4.270 12/17/2020   INR 0.94 12/22/2014      Assessment & Plan:   Problem List Items Addressed This Visit       Cardiovascular and Mediastinum   Atherosclerosis of aorta ([place])    The current medical regimen is effective;  continue present plan and medications. Taking Repatha 140 mg every 14 days and Fish Oil.        Digestive   Malignant neoplasm of colon ([place])    Management per specialist.      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      Relevant Medications   valACYclovir (VALTREX) 1000 MG tablet   GERD without esophagitis    The current medical regimen is effective;  continue present plan and medications.      Recurrent cold sores    Started Valtrex 2 g twice a day x 10 days.      Relevant Medications   valACYclovir (VALTREX) 1000 MG tablet     Nervous and Auditory   Drug-induced polyneuropathy ([facility])    No meds needed.        Genitourinary   Stage 3a chronic kidney disease ([place])    Check labs.        Other   Mixed hyperlipidemia - Primary    Well controlled.  No changes to medicines.  Continue to work on eating a healthy diet and exercise.  Labs drawn today.        Relevant Orders   CBC With Diff/Platelet   Lipid panel   RESOLVED: Primary insomnia   Iron deficiency anemia    Check labs today.      Other Visit Diagnoses     Hypertensive renal disease, benign, stage 1-4 or unspecified chronic kidney disease       Relevant Orders   CBC With Diff/Platelet   Comprehensive metabolic panel     .  Meds ordered this encounter  Medications   valACYclovir (VALTREX) 1000 MG tablet    Sig: Take 2 tablets (2,000 mg total) by mouth 2 (two) times daily.    Dispense:  20 tablet    Refill:  1    Orders Placed This Encounter  Procedures   CBC With Diff/Platelet   Comprehensive metabolic panel   Lipid panel   I,Marla Leal Borjas,acting as a scribe for Rochel Brome, MD.,have documented all relevant documentation on the behalf of Rochel Brome, MD,as directed by  Rochel Brome, MD while in the presence of Rochel Brome, MD.  Follow-up: Return in about 3 months (around 10/11/2021) for chronic fasting.  An After Visit Summary was printed and given to the patient.  Rochel Brome, MD [facility] [phone]

## 2021-07-11 ENCOUNTER — Encounter: Payer: Self-pay | Admitting: Family Medicine

## 2021-07-11 ENCOUNTER — Ambulatory Visit: Payer: 59 | Admitting: Family Medicine

## 2021-07-11 VITALS — BP 130/80 | HR 74 | Temp 97.4°F | Ht 65.0 in | Wt 220.6 lb

## 2021-07-11 DIAGNOSIS — I700 Atherosclerosis of aorta: Secondary | ICD-10-CM

## 2021-07-11 DIAGNOSIS — I129 Hypertensive chronic kidney disease with stage 1 through stage 4 chronic kidney disease, or unspecified chronic kidney disease: Secondary | ICD-10-CM | POA: Diagnosis not present

## 2021-07-11 DIAGNOSIS — C184 Malignant neoplasm of transverse colon: Secondary | ICD-10-CM

## 2021-07-11 DIAGNOSIS — G620 Drug-induced polyneuropathy: Secondary | ICD-10-CM

## 2021-07-11 DIAGNOSIS — E782 Mixed hyperlipidemia: Secondary | ICD-10-CM

## 2021-07-11 DIAGNOSIS — F5101 Primary insomnia: Secondary | ICD-10-CM

## 2021-07-11 DIAGNOSIS — D508 Other iron deficiency anemias: Secondary | ICD-10-CM | POA: Diagnosis not present

## 2021-07-11 DIAGNOSIS — N1831 Chronic kidney disease, stage 3a: Secondary | ICD-10-CM

## 2021-07-11 DIAGNOSIS — C772 Secondary and unspecified malignant neoplasm of intra-abdominal lymph nodes: Secondary | ICD-10-CM

## 2021-07-11 DIAGNOSIS — K219 Gastro-esophageal reflux disease without esophagitis: Secondary | ICD-10-CM

## 2021-07-11 DIAGNOSIS — C189 Malignant neoplasm of colon, unspecified: Secondary | ICD-10-CM

## 2021-07-11 DIAGNOSIS — B001 Herpesviral vesicular dermatitis: Secondary | ICD-10-CM

## 2021-07-11 MED ORDER — VALACYCLOVIR HCL 1 G PO TABS: 2000.0000 mg | ORAL_TABLET | Freq: Two times a day (BID) | ORAL | 1 refills | Status: DC

## 2021-07-11 NOTE — Assessment & Plan Note (Signed)
Started Valtrex 2 g twice a day x 10 days.

## 2021-07-11 NOTE — Assessment & Plan Note (Signed)
Management per specialist. 

## 2021-07-11 NOTE — Assessment & Plan Note (Signed)
Check labs today.

## 2021-07-11 NOTE — Assessment & Plan Note (Addendum)
The current medical regimen is effective;  continue present plan and medications. Taking Repatha 140 mg every 14 days and Fish Oil.

## 2021-07-11 NOTE — Assessment & Plan Note (Signed)
The current medical regimen is effective;  continue present plan and medications.  

## 2021-07-11 NOTE — Assessment & Plan Note (Signed)
Well controlled.  ?No changes to medicines.  ?Continue to work on eating a healthy diet and exercise.  ?Labs drawn today.  ?

## 2021-07-11 NOTE — Assessment & Plan Note (Addendum)
No meds needed  

## 2021-07-11 NOTE — Assessment & Plan Note (Signed)
Check labs 

## 2021-07-12 ENCOUNTER — Encounter: Payer: Self-pay | Admitting: Oncology

## 2021-07-12 LAB — COMPREHENSIVE METABOLIC PANEL
ALT: 21 IU/L (ref 0–32)
AST: 20 IU/L (ref 0–40)
Albumin/Globulin Ratio: 1.6 (ref 1.2–2.2)
Albumin: 4.4 g/dL (ref 3.8–4.9)
Alkaline Phosphatase: 92 IU/L (ref 44–121)
BUN/Creatinine Ratio: 10 (ref 9–23)
BUN: 12 mg/dL (ref 6–24)
Bilirubin Total: 0.4 mg/dL (ref 0.0–1.2)
CO2: 25 mmol/L (ref 20–29)
Calcium: 9.9 mg/dL (ref 8.7–10.2)
Chloride: 105 mmol/L (ref 96–106)
Creatinine, Ser: 1.18 mg/dL — ABNORMAL HIGH (ref 0.57–1.00)
Globulin, Total: 2.7 g/dL (ref 1.5–4.5)
Glucose: 103 mg/dL — ABNORMAL HIGH (ref 70–99)
Potassium: 4.6 mmol/L (ref 3.5–5.2)
Sodium: 144 mmol/L (ref 134–144)
Total Protein: 7.1 g/dL (ref 6.0–8.5)
eGFR: 56 mL/min/{1.73_m2} — ABNORMAL LOW (ref >59)

## 2021-07-12 LAB — CBC WITH DIFF/PLATELET
Basophils Absolute: 0.1 10*3/uL (ref 0.0–0.2)
Basos: 1 %
EOS (ABSOLUTE): 0.4 10*3/uL (ref 0.0–0.4)
Eos: 7 %
Hematocrit: 40.9 % (ref 34.0–46.6)
Hemoglobin: 13.4 g/dL (ref 11.1–15.9)
Immature Grans (Abs): 0 10*3/uL (ref 0.0–0.1)
Immature Granulocytes: 0 %
Lymphocytes Absolute: 2.3 10*3/uL (ref 0.7–3.1)
Lymphs: 39 %
MCH: 27.2 pg (ref 26.6–33.0)
MCHC: 32.8 g/dL (ref 31.5–35.7)
MCV: 83 fL (ref 79–97)
Monocytes Absolute: 0.5 10*3/uL (ref 0.1–0.9)
Monocytes: 8 %
Neutrophils Absolute: 2.7 10*3/uL (ref 1.4–7.0)
Neutrophils: 45 %
Platelets: 243 10*3/uL (ref 150–450)
RBC: 4.93 x10E6/uL (ref 3.77–5.28)
RDW: 14.1 % (ref 11.7–15.4)
WBC: 6 10*3/uL (ref 3.4–10.8)

## 2021-07-12 LAB — LIPID PANEL
Chol/HDL Ratio: 3 ratio (ref 0.0–4.4)
Cholesterol, Total: 130 mg/dL (ref 100–199)
HDL: 43 mg/dL (ref >39)
LDL Chol Calc (NIH): 71 mg/dL (ref 0–99)
Triglycerides: 82 mg/dL (ref 0–149)
VLDL Cholesterol Cal: 16 mg/dL (ref 5–40)

## 2021-07-12 LAB — CARDIOVASCULAR RISK ASSESSMENT

## 2021-07-25 ENCOUNTER — Other Ambulatory Visit: Payer: Self-pay

## 2021-07-25 ENCOUNTER — Other Ambulatory Visit: Payer: Self-pay | Admitting: Obstetrics & Gynecology

## 2021-07-25 DIAGNOSIS — Z1231 Encounter for screening mammogram for malignant neoplasm of breast: Secondary | ICD-10-CM

## 2021-07-26 ENCOUNTER — Other Ambulatory Visit: Payer: Self-pay

## 2021-07-26 MED ORDER — REPATHA SURECLICK 140 MG/ML ~~LOC~~ SOAJ: 140.0000 mg | SUBCUTANEOUS | 0 refills | Status: DC

## 2021-08-01 ENCOUNTER — Ambulatory Visit
Admission: RE | Admit: 2021-08-01 | Discharge: 2021-08-01 | Disposition: A | Discharge status: Home / Self Care | Payer: 59 | Source: Ambulatory Visit | Attending: Obstetrics & Gynecology | Admitting: Obstetrics & Gynecology

## 2021-08-01 DIAGNOSIS — Z1231 Encounter for screening mammogram for malignant neoplasm of breast: Secondary | ICD-10-CM

## 2021-08-22 ENCOUNTER — Ambulatory Visit (INDEPENDENT_AMBULATORY_CARE_PROVIDER_SITE_OTHER): Payer: 59 | Admitting: Obstetrics & Gynecology

## 2021-08-22 ENCOUNTER — Encounter: Payer: Self-pay | Admitting: Obstetrics & Gynecology

## 2021-08-22 ENCOUNTER — Other Ambulatory Visit (HOSPITAL_COMMUNITY)
Admission: RE | Admit: 2021-08-22 | Discharge: 2021-08-22 | Disposition: A | Discharge status: Home / Self Care | Payer: 59 | Source: Ambulatory Visit | Attending: Obstetrics & Gynecology | Admitting: Obstetrics & Gynecology

## 2021-08-22 VITALS — BP 124/82 | Ht 64.0 in | Wt 215.0 lb

## 2021-08-22 DIAGNOSIS — Z01419 Encounter for gynecological examination (general) (routine) without abnormal findings: Secondary | ICD-10-CM | POA: Insufficient documentation

## 2021-08-22 DIAGNOSIS — Z780 Asymptomatic menopausal state: Secondary | ICD-10-CM | POA: Diagnosis not present

## 2021-08-22 DIAGNOSIS — C189 Malignant neoplasm of colon, unspecified: Secondary | ICD-10-CM

## 2021-08-22 NOTE — Progress Notes (Addendum)
Brenda Becker 02/10/1970 [id number]   History:    51 y.o. G1P1L1 Married.  Daughter is 30 yo   RP:  Established patient presenting for annual gyn exam and Postop HSC/Myosure Excision/D+C on 06/16/2020   HPI:  Postmenopause.  No PMB x removal of the Endometrial Polyp which was benign.  Abstinent x 3 years.  Pap Neg 05/2018.  Pap reflex today.  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 Patient has cold sores around the mouth occasionally, never had genital HSV.   No pelvic pain.  Chemotherapy for colon Cancer x 6 years.  Colonoscopy 12/2020 Neg.  Breasts normal.  Mammo Neg 07/2021.  Urine and bowel movements currently normal.  Body mass index 36.9.  Health [facility] Dr Clayborne Artist MD.   Past medical history,surgical history, family history and social history were all reviewed and documented in the EPIC chart.  Gynecologic History No LMP recorded. (Menstrual status: Chemotherapy).  Obstetric History OB History  Gravida Para Term Preterm AB Living  '1 1       1  '$ SAB IAB Ectopic Multiple Live Births               # Outcome Date GA Lbr Len/2nd Weight Sex Delivery Anes PTL Lv  1 Para              ROS: A ROS was performed and pertinent positives and negatives are included in the history. GENERAL: No fevers or chills. HEENT: No change in vision, no earache, sore throat or sinus congestion. NECK: No pain or stiffness. CARDIOVASCULAR: No chest pain or pressure. No palpitations. PULMONARY: No shortness of breath, cough or wheeze. GASTROINTESTINAL: No abdominal pain, nausea, vomiting or diarrhea, melena or bright red blood per rectum. GENITOURINARY: No urinary frequency, urgency, hesitancy or dysuria. MUSCULOSKELETAL: No joint or muscle pain, no back pain, no recent trauma. DERMATOLOGIC: No rash, no itching, no lesions. ENDOCRINE: No polyuria, polydipsia, no heat or cold intolerance. No recent change in weight. HEMATOLOGICAL: No anemia or easy bruising or bleeding. NEUROLOGIC: No headache, seizures,  numbness, tingling or weakness. PSYCHIATRIC: No depression, no loss of interest in normal activity or change in sleep pattern.     Exam:   BP 124/82 (BP Location: Right Arm, Patient Position: Sitting, Cuff Size: Normal)   Ht '5\' 4"'$  (1.626 m)   Wt 215 lb (97.5 kg)   BMI 36.90 kg/m   Body mass index is 36.9 kg/m.  General appearance : Well developed well nourished female. No acute distress HEENT: Eyes: no retinal hemorrhage or exudates,  Neck supple, trachea midline, no carotid bruits, no thyroidmegaly Lungs: Clear to auscultation, no rhonchi or wheezes, or rib retractions  Heart: Regular rate and rhythm, no murmurs or gallops Breast:Examined in sitting and supine position were symmetrical in appearance, no palpable masses or tenderness,  no skin retraction, no nipple inversion, no nipple discharge, no skin discoloration, no axillary or supraclavicular lymphadenopathy Abdomen: no palpable masses or tenderness, no rebound or guarding Extremities: no edema or skin discoloration or tenderness  Pelvic: Vulva: Normal             Vagina: No gross lesions or discharge  Cervix: No gross lesions or discharge.  Pap reflex done.  Uterus  AV, normal size, shape and consistency, non-tender and mobile  Adnexa  Without masses or tenderness  Anus: Normal   Assessment/Plan:  50 y.o. female for annual exam   1. Encounter for routine gynecological examination with Papanicolaou smear of cervix Postmenopause.  No PMB x removal of the Endometrial Polyp which was benign.  Abstinent x 3 years.  Pap Neg 05/2018.  Pap reflex today.  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 Patient has cold sores around the mouth occasionally, never had genital HSV.   No pelvic pain.  Chemotherapy for colon Cancer x 6 years.  Colonoscopy 12/2020 Neg.  Breasts normal.  Mammo Neg 07/2021.  Urine and bowel movements currently normal.  Body mass index 36.9.  [facility] Clayborne Artist MD. [facility]  2.  Postmenopause Postmenopause.  No PMB x removal of the Endometrial Polyp which was benign.  Abstinent x 3 years. Vit D, Ca++ 1.5 g/d total, regular weight bearing activities.  3. Malignant neoplasm of colon, unspecified part of colon [facility])  Chemotherapy for colon Cancer x 6 years.  Colonoscopy 12/2020 Neg.  [name] MD, 9:18 AM 08/22/2021

## 2021-08-22 NOTE — Addendum Note (Signed)
Addended by: [name] on: 08/22/2021 03:34 PM   Modules accepted: Orders

## 2021-08-29 LAB — CYTOLOGY - PAP
Diagnosis: NEGATIVE
Diagnosis: REACTIVE

## 2021-09-29 ENCOUNTER — Other Ambulatory Visit: Payer: Self-pay | Admitting: Family Medicine

## 2021-10-17 NOTE — Progress Notes (Signed)
Subjective:  Patient ID: Brenda Becker, female    DOB: Jan 11, 1970  Age: 51 y.o. MRN: [id number]  Chief Complaint  Patient presents with   Hypertension   Hyperlipidemia    HPI Hypertension: Taking Losartan 50 mg daily.  Hyperlipidemia: Takes Repatha 140 mg every 14 days, Fish Oil daily.  GERD: Currently taking Omeprazole 40 mg daily. Patient had breakthrough reflux on famotidine alone.   Left heel pain. Bought a brace to stretch.  Has not tried nsaids. Has not had heel lifts. Symptoms been going on 4 months.   Current Outpatient Medications on File Prior to Visit  Medication Sig Dispense Refill   Evolocumab (REPATHA SURECLICK) 545 MG/ML SOAJ Inject 140 mg into the skin every 14 (fourteen) days. 6 mL 0   ferrous sulfate 325 (65 FE) MG tablet Take by mouth every other day.     losartan (COZAAR) 50 MG tablet Take 1 tablet (50 mg total) by mouth daily. 90 tablet 0   Omega-3 Fatty Acids (FISH OIL PO) Take 1,080 mg by mouth at bedtime.     omeprazole (PRILOSEC) 40 MG capsule Take 1 capsule (40 mg total) by mouth daily. 90 capsule 3   Turmeric 500 MG TABS Take 2 tablets by mouth daily.     valACYclovir (VALTREX) 1000 MG tablet Take 2 tablets (2,000 mg total) by mouth 2 (two) times daily. 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 Milford Cage [facility])   Mixed hyperlipidemia    Myalgia due to statin 09/12/2020   Peripheral neuropathy due to chemotherapy ([place])    06-14-2020  per pt right arm to fingers   PONV (postoperative nausea and vomiting)    vomit with Gallbladder surgery   Port catheter in place 08/20/2015   Vitamin D deficiency    Wears glasses    Past Surgical History:  Procedure Laterality Date   BREAST BIOPSY Right 2019   per pt benign   COLON SURGERY  11/16/2014   '@[facility]'$ ---  partial transverse colectomy w/ node dissection   COLONOSCOPY  10/19/2014   Colonic mass (biopsied and tattooed) Small internal hemorrhoids.   DILATATION & CURETTAGE/HYSTEROSCOPY WITH MYOSURE N/A 06/16/2020   Procedure: DILATATION & CURETTAGE/HYSTEROSCOPY WITH MYOSURE EXCISION;  Surgeon: [name], MD;  Location: [facility];  Service: Gynecology;  Laterality: N/A;   EUS N/A 02/17/2016   Procedure: UPPER ENDOSCOPIC ULTRASOUND (EUS) RADIAL;  Surgeon: Milus Banister, MD;  Location: [facility];  Service: Endoscopy;  Laterality: N/A;  EUS N/A 07/20/2016   Procedure: UPPER ENDOSCOPIC ULTRASOUND (EUS) RADIAL;  Surgeon: Milus Banister, MD;  Location: [facility];  Service: Endoscopy;  Laterality: N/A;   KNEE ARTHROSCOPY Right 2012   LAPAROSCOPIC CHOLECYSTECTOMY  2003   PORTA CATH INSERTION  12/22/2014   TONSILLECTOMY  1994    Family History  Problem Relation Age of Onset   Other Father        doesn't go to doctor or get colonoscopies   Other Brother        stomach issues; has had early colonoscopy   Other Maternal Aunt        won't go to doctor; probably has not had colonoscopy   Cervical cancer Paternal Aunt 63       treated at [place] cancer Paternal Aunt 57       smoker   Skin cancer Maternal Grandmother        non-melanoma; dx. late 60s   Prostate cancer Maternal Grandfather        dx. late 17s   Breast cancer Paternal Grandmother        dx. 32s   Cancer Cousin        "some kind of stomach cancer" dx. early 60s   Stomach cancer Other        dx. "older"   Colon cancer Other        dx. 23s   Cancer Other        "female cancer" dx. "later age";    Breast cancer Other        dx. 21s-60s   Ulcerative colitis Neg Hx    Social History   Socioeconomic History   Marital status: Married    Spouse name: Not on file   Number of children: 1   Years of education: Not on file   Highest education level: Not on file  Occupational History   Occupation: Freight forwarder    Comment: Health and safety inspector  Tobacco Use   Smoking status: Never   Smokeless tobacco: Never  Vaping Use   Vaping Use: Never used  Substance and Sexual Activity   Alcohol use: No   Drug use: Never   Sexual activity: Not Currently    Partners: Male    Birth control/protection: Abstinence    Comment: MARRIED- 75 YRS  Other Topics Concern   Not on file  Social History Narrative   Not on file   Social Determinants of Health   Financial Resource Strain: Not on file  Food Insecurity: Not on file  Transportation Needs: Not on file  Physical Activity: Not on file  Stress: Not on file  Social Connections: Not on file    Review of Systems  Constitutional:  Negative for fatigue.  HENT:  Negative for congestion, ear pain and sore throat.   Respiratory:  Negative for cough and shortness of breath.   Cardiovascular:  Negative for chest pain.  Gastrointestinal:  Negative for abdominal pain, constipation, diarrhea, nausea and vomiting.  Genitourinary:  Negative for dysuria, frequency and urgency.  Musculoskeletal:  Negative for arthralgias, back pain and myalgias.  Neurological:  Negative for dizziness and headaches.  Psychiatric/Behavioral:   Negative for agitation and sleep disturbance. The patient is not nervous/anxious.      Objective:  BP 122/80 (BP Location: Left Arm, Patient Position: Sitting, Cuff Size: Large)   Pulse 79   Temp (!) 97.4 F (36.3 C) (Temporal)   Ht $R'5\' 5"'nS$  (1.651 m)   Wt 219 lb  9.6 oz (99.6 kg)   SpO2 98%   BMI 36.54 kg/m      10/18/2021    7:39 AM 08/22/2021    8:54 AM 07/11/2021    7:46 AM  BP/Weight  Systolic BP 440 102 725  Diastolic BP 80 82 80  Wt. (Lbs) 219.6 215 220.6  BMI 36.54 kg/m2 36.9 kg/m2 36.71 kg/m2    Physical Exam Vitals reviewed.  Constitutional:      Appearance: Normal appearance. She is normal weight.  Neck:     Vascular: No carotid bruit.  Cardiovascular:     Rate and Rhythm: Normal rate and regular rhythm.     Heart sounds: Normal heart sounds.  Pulmonary:     Effort: Pulmonary effort is normal.     Breath sounds: Normal breath sounds.  Abdominal:     General: Abdomen is flat. Bowel sounds are normal.     Palpations: Abdomen is soft.     Tenderness: There is no abdominal tenderness.  Musculoskeletal:        General: Tenderness (left achilles. FROM of ankle. nontender over plantar fascia) present.  Neurological:     Mental Status: She is alert and oriented to person, place, and time.  Psychiatric:        Mood and Affect: Mood normal.        Behavior: Behavior normal.     Diabetic Foot Exam - Simple   No data filed      Lab Results  Component Value Date   WBC 7.0 10/18/2021   HGB 13.7 10/18/2021   HCT 40.8 10/18/2021   PLT 240 10/18/2021   GLUCOSE 97 10/18/2021   CHOL 124 10/18/2021   TRIG 105 10/18/2021   HDL 42 10/18/2021   LDLCALC 63 10/18/2021   ALT 15 10/18/2021   AST 17 10/18/2021   NA 143 10/18/2021   K 4.1 10/18/2021   CL 105 10/18/2021   CREATININE 1.17 (H) 10/18/2021   BUN 13 10/18/2021   CO2 23 10/18/2021   TSH 4.270 12/17/2020   INR 0.94 12/22/2014      Assessment & Plan:   Problem List Items Addressed This Visit        Digestive   Malignant neoplasm of colon ([place])   GERD without esophagitis    The current medical regimen is effective;  continue present plan and medications. Continue omeprazole 40 mg daily.         Musculoskeletal and Integument   Injury of left Achilles tendon    Start on naproxen 500 mg twice daily x 14 days.  Education given. Exercises.  If no improvement, call back and will refer to podiatry.       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  No changes to medicines. Continue losartan 50 mg daily  Continue to work on eating a healthy diet and exercise.  Labs drawn today.        Stage 3a chronic kidney disease ([place])     Other   Mixed hyperlipidemia    Well controlled.  No changes to medicines. Continue Repatha 140 mg every 14 days, Fish Oil daily. Continue to work on eating a healthy diet and exercise.  Labs drawn today.        Relevant Orders   Lipid panel (Completed)   Encounter for immunization   Relevant Orders   Flu Vaccine  MDCK QUAD PF (Completed)  .  Meds ordered this encounter  Medications   naproxen (NAPROSYN) 500 MG tablet    Sig: Take 1 tablet (500 mg total) by mouth 2 (two) times daily with a meal.    Dispense:  28 tablet    Refill:  0    Orders Placed This Encounter  Procedures   Flu Vaccine MDCK QUAD PF   Comprehensive metabolic panel   Lipid panel   CBC with Differential/Platelet   Cardiovascular Risk Assessment     Follow-up: Return in about 4 months (around 02/18/2022) for chronic fasting.  An After Visit Summary was printed and given to the patient.  Rochel Brome, MD [facility] [phone]

## 2021-10-18 ENCOUNTER — Ambulatory Visit: Payer: 59 | Admitting: Family Medicine

## 2021-10-18 ENCOUNTER — Encounter: Payer: Self-pay | Admitting: Family Medicine

## 2021-10-18 VITALS — BP 122/80 | HR 79 | Temp 97.4°F | Ht 65.0 in | Wt 219.6 lb

## 2021-10-18 DIAGNOSIS — K219 Gastro-esophageal reflux disease without esophagitis: Secondary | ICD-10-CM

## 2021-10-18 DIAGNOSIS — N1831 Chronic kidney disease, stage 3a: Secondary | ICD-10-CM

## 2021-10-18 DIAGNOSIS — Z23 Encounter for immunization: Secondary | ICD-10-CM

## 2021-10-18 DIAGNOSIS — S86002A Unspecified injury of left Achilles tendon, initial encounter: Secondary | ICD-10-CM

## 2021-10-18 DIAGNOSIS — I10 Essential (primary) hypertension: Secondary | ICD-10-CM

## 2021-10-18 DIAGNOSIS — I129 Hypertensive chronic kidney disease with stage 1 through stage 4 chronic kidney disease, or unspecified chronic kidney disease: Secondary | ICD-10-CM

## 2021-10-18 DIAGNOSIS — E782 Mixed hyperlipidemia: Secondary | ICD-10-CM

## 2021-10-18 DIAGNOSIS — C184 Malignant neoplasm of transverse colon: Secondary | ICD-10-CM | POA: Diagnosis not present

## 2021-10-18 MED ORDER — NAPROXEN 500 MG PO TABS: 500.0000 mg | ORAL_TABLET | Freq: Two times a day (BID) | ORAL | 0 refills | Status: DC

## 2021-10-18 NOTE — Patient Instructions (Signed)
Start on naproxen 500 mg twice daily x 14 days.

## 2021-10-19 LAB — CBC WITH DIFFERENTIAL/PLATELET
Basophils Absolute: 0.1 10*3/uL (ref 0.0–0.2)
Basos: 1 %
EOS (ABSOLUTE): 0.9 10*3/uL — ABNORMAL HIGH (ref 0.0–0.4)
Eos: 13 %
Hematocrit: 40.8 % (ref 34.0–46.6)
Hemoglobin: 13.7 g/dL (ref 11.1–15.9)
Immature Grans (Abs): 0 10*3/uL (ref 0.0–0.1)
Immature Granulocytes: 0 %
Lymphocytes Absolute: 2.6 10*3/uL (ref 0.7–3.1)
Lymphs: 36 %
MCH: 27.8 pg (ref 26.6–33.0)
MCHC: 33.6 g/dL (ref 31.5–35.7)
MCV: 83 fL (ref 79–97)
Monocytes Absolute: 0.5 10*3/uL (ref 0.1–0.9)
Monocytes: 7 %
Neutrophils Absolute: 3 10*3/uL (ref 1.4–7.0)
Neutrophils: 43 %
Platelets: 240 10*3/uL (ref 150–450)
RBC: 4.93 x10E6/uL (ref 3.77–5.28)
RDW: 14.4 % (ref 11.7–15.4)
WBC: 7 10*3/uL (ref 3.4–10.8)

## 2021-10-19 LAB — COMPREHENSIVE METABOLIC PANEL
ALT: 15 IU/L (ref 0–32)
AST: 17 IU/L (ref 0–40)
Albumin/Globulin Ratio: 1.6 (ref 1.2–2.2)
Albumin: 4.4 g/dL (ref 3.8–4.9)
Alkaline Phosphatase: 88 IU/L (ref 44–121)
BUN/Creatinine Ratio: 11 (ref 9–23)
BUN: 13 mg/dL (ref 6–24)
Bilirubin Total: 0.4 mg/dL (ref 0.0–1.2)
CO2: 23 mmol/L (ref 20–29)
Calcium: 9.7 mg/dL (ref 8.7–10.2)
Chloride: 105 mmol/L (ref 96–106)
Creatinine, Ser: 1.17 mg/dL — ABNORMAL HIGH (ref 0.57–1.00)
Globulin, Total: 2.8 g/dL (ref 1.5–4.5)
Glucose: 97 mg/dL (ref 70–99)
Potassium: 4.1 mmol/L (ref 3.5–5.2)
Sodium: 143 mmol/L (ref 134–144)
Total Protein: 7.2 g/dL (ref 6.0–8.5)
eGFR: 56 mL/min/{1.73_m2} — ABNORMAL LOW (ref >59)

## 2021-10-19 LAB — LIPID PANEL
Chol/HDL Ratio: 3 ratio (ref 0.0–4.4)
Cholesterol, Total: 124 mg/dL (ref 100–199)
HDL: 42 mg/dL (ref >39)
LDL Chol Calc (NIH): 63 mg/dL (ref 0–99)
Triglycerides: 105 mg/dL (ref 0–149)
VLDL Cholesterol Cal: 19 mg/dL (ref 5–40)

## 2021-10-19 LAB — CARDIOVASCULAR RISK ASSESSMENT

## 2021-10-20 NOTE — Progress Notes (Signed)
Blood count normal.  Liver function normal.  Kidney function abnormal. Stable. Cholesterol: good.

## 2021-10-24 DIAGNOSIS — S86002A Unspecified injury of left Achilles tendon, initial encounter: Secondary | ICD-10-CM | POA: Insufficient documentation

## 2021-10-24 DIAGNOSIS — Z23 Encounter for immunization: Secondary | ICD-10-CM | POA: Insufficient documentation

## 2021-10-24 NOTE — Assessment & Plan Note (Signed)
The current medical regimen is effective;  continue present plan and medications. Continue omeprazole 40 mg daily  

## 2021-10-24 NOTE — Assessment & Plan Note (Signed)
Well controlled.  No changes to medicines. Continue Repatha 140 mg every 14 days, Fish Oil daily. Continue to work on eating a healthy diet and exercise.  Labs drawn today.  

## 2021-10-24 NOTE — Assessment & Plan Note (Signed)
Well controlled.  No changes to medicines. Continue losartan 50 mg daily  Continue to work on eating a healthy diet and exercise.  Labs drawn today.  

## 2021-10-24 NOTE — Assessment & Plan Note (Signed)
Start on naproxen 500 mg twice daily x 14 days.  Education given. Exercises.  If no improvement, call back and will refer to podiatry.

## 2021-10-31 ENCOUNTER — Other Ambulatory Visit: Payer: Self-pay | Admitting: Family Medicine

## 2021-11-17 ENCOUNTER — Other Ambulatory Visit: Payer: Self-pay | Admitting: Family Medicine

## 2022-01-23 ENCOUNTER — Other Ambulatory Visit: Payer: Self-pay | Admitting: Family Medicine

## 2022-01-31 ENCOUNTER — Other Ambulatory Visit: Payer: Self-pay | Admitting: Family Medicine

## 2022-02-14 ENCOUNTER — Encounter: Payer: Self-pay | Admitting: Oncology

## 2022-02-16 ENCOUNTER — Encounter: Payer: Self-pay | Admitting: Oncology

## 2022-02-20 NOTE — Progress Notes (Unsigned)
Subjective:  Patient ID: Brenda Becker, female    DOB: [birth year]  Age: 52 y.o. MRN: [id number]  Chief Complaint  Patient presents with   Hypertension   Hyperlipidemia    HPI Hypertensive with ckd 3A: Taking Losartan 50 mg daily. Hyperlipidemia: Takes Repatha 140 mg every 14 days, Fish Oil daily. GERD: Currently taking Omeprazole 40 mg daily. Patient had breakthrough reflux on famotidine alone.  Colon cancer: stable. Monitored by oncology. Chemo induced neuropathy.  Obesity: Eats healthy. Walks a lot at work.       02/21/2022    9:08 AM 07/11/2021    7:47 AM 06/07/2020    4:03 PM 04/07/2019    8:27 AM  Depression screen PHQ 2/9  Decreased Interest 0 0 0 0  Down, Depressed, Hopeless 0 0 0 0  PHQ - 2 Score 0 0 0 0  Altered sleeping   0   Tired, decreased energy   0   Change in appetite   0   Feeling bad or failure about yourself    0   Trouble concentrating   0   Moving slowly or fidgety/restless   0   Suicidal thoughts   0   PHQ-9 Score   0   Difficult doing work/chores   Not difficult at all          01/27/2019    2:14 PM 02/25/2019   10:55 AM 02/25/2019   11:45 AM 04/21/2020    8:50 AM 02/21/2022    9:08 AM  Fall Risk  Falls in the past year?    0 0  Was there an injury with Fall?    0 0  Fall Risk Category Calculator    0 0  Fall Risk Category (Retired)    Low   (RETIRED) Patient Fall Risk Level Low fall risk Low fall risk Low fall risk Low fall risk   Patient at Risk for Falls Due to    No Fall Risks No Fall Risks  Fall risk Follow up    Falls evaluation completed Falls evaluation completed      Review of Systems  Constitutional:  Negative for appetite change, fatigue and fever.  HENT:  Negative for congestion, ear pain, sinus pressure and sore throat.   Respiratory:  Negative for cough, chest tightness, shortness of breath and wheezing.   Cardiovascular:  Negative for chest pain and palpitations.  Gastrointestinal:  Negative for abdominal pain, constipation,  diarrhea, nausea and vomiting.  Genitourinary:  Negative for dysuria and hematuria.  Musculoskeletal:  Negative for arthralgias, back pain, joint swelling and myalgias.  Skin:  Negative for rash.  Neurological:  Negative for dizziness, weakness and headaches.  Psychiatric/Behavioral:  Negative for dysphoric mood. The patient is not nervous/anxious.     Current Outpatient Medications on File Prior to Visit  Medication Sig Dispense Refill   losartan (COZAAR) 50 MG tablet Take 1 tablet (50 mg total) by mouth daily. 30 tablet 2   Omega-3 Fatty Acids (FISH OIL PO) Take 1,080 mg by mouth at bedtime.     omeprazole (PRILOSEC) 40 MG capsule Take 1 capsule (40 mg total) by mouth daily. 90 capsule 3   REPATHA SURECLICK [id number] MG/ML SOAJ Inject 140 mg into the skin every 14 (fourteen) days. 6 mL 0   Turmeric 500 MG TABS Take 2 tablets by mouth daily.     valACYclovir (VALTREX) 1000 MG tablet Take 2 tablets (2,000 mg total) by mouth 2 (two) times daily. 20 tablet 1  Current Facility-Administered Medications on File Prior to Visit  Medication Dose Route Frequency Provider Last Rate Last Admin   sodium chloride flush (NS) 0.9 % injection 10 mL  10 mL Intravenous PRN Ladell Pier, MD   10 mL at 05/28/15 0956   Past Medical History:  Diagnosis Date   Abnormal uterine bleeding (AUB)    CKD (chronic kidney disease), stage II    Endometrial polyp    Familial hyperlipidemia, high LDL 04/07/2019   Family history of cancer 01/25/2015   GAD (generalized anxiety disorder)    GERD (gastroesophageal reflux disease)    Hepatitis 02/26/2017   History of hepatitis 04/2017   IMMUNOTHERAPY INDUCED HEPATITIS,  TREATED   Hypomagnesemia    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  [place] [facility])   Mixed hyperlipidemia    Myalgia due to statin 09/12/2020   Peripheral neuropathy due to chemotherapy ([place])    06-14-2020  per pt right arm to fingers   PONV (postoperative nausea and vomiting)    vomit with Gallbladder surgery   Port catheter in place 08/20/2015   Vitamin D deficiency    Wears glasses    Past Surgical History:  Procedure Laterality Date   BREAST BIOPSY Right 2019   per pt benign   COLON SURGERY  11/16/2014   '@[facility]'$ ---  partial transverse colectomy w/ node dissection   COLONOSCOPY  10/19/2014   Colonic mass (biopsied and tattooed) Small internal hemorrhoids.   DILATATION & CURETTAGE/HYSTEROSCOPY WITH MYOSURE N/A 06/16/2020   Procedure: DILATATION & CURETTAGE/HYSTEROSCOPY WITH MYOSURE EXCISION;  Surgeon: [name], MD;  Location: [facility];  Service: Gynecology;  Laterality: N/A;   EUS N/A 02/17/2016   Procedure: UPPER ENDOSCOPIC ULTRASOUND (EUS) RADIAL;  Surgeon: Milus Banister, MD;  Location: [facility];  Service: Endoscopy;  Laterality: N/A;   EUS N/A 07/20/2016   Procedure: UPPER ENDOSCOPIC ULTRASOUND (EUS) RADIAL;  Surgeon: Milus Banister, MD;  [facility]: [facility];  Service: Endoscopy;  Laterality: N/A;   KNEE ARTHROSCOPY Right 2012   LAPAROSCOPIC CHOLECYSTECTOMY  2003   PORTA CATH INSERTION  12/22/2014   TONSILLECTOMY  1994    Family History  Problem Relation Age of Onset   Other Father        doesn't go to doctor or get colonoscopies   Other Brother        stomach issues; has had early colonoscopy   Other Maternal Aunt        won't go to doctor; probably has not had colonoscopy   Cervical cancer Paternal Aunt 63       treated at [place] cancer Paternal Aunt 57       smoker   Skin cancer Maternal Grandmother        non-melanoma; dx. late 60s   Prostate cancer Maternal Grandfather        dx. late 52s   Breast cancer Paternal Grandmother        dx. 9s   Cancer Cousin        "some  kind of stomach cancer" dx. early 73s   Stomach cancer Other        dx. "older"   Colon cancer Other  dx. 9s   Cancer Other        "female cancer" dx. "later age";    Breast cancer Other        dx. 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  Tobacco Use   Smoking status: Never   Smokeless tobacco: Never  Vaping Use   Vaping Use: Never used  Substance and Sexual Activity   Alcohol use: No   Drug use: Never   Sexual activity: Not Currently    Partners: Male    Birth control/protection: Abstinence    Comment: MARRIED- 64 YRS  Other Topics Concern   Not on file  Social History Narrative   Not on file   Social Determinants of Health   Financial Resource Strain: Not on file  Food Insecurity: Not on file  Transportation Needs: Not on file  Physical Activity: Not on file  Stress: Not on file  Social Connections: Not on file    Objective:  BP 126/84 (BP Location: Left Arm, Patient Position: Sitting)   Pulse 76   Temp (!) 97.5 F (36.4 C) (Temporal)   Ht '5\' 5"'$  (1.651 m)   Wt 218 lb (98.9 kg)   SpO2 99%   BMI 36.28 kg/m      02/21/2022    9:05 AM 10/18/2021    7:39 AM 08/22/2021    8:54 AM  BP/Weight  Systolic BP 123XX123 123XX123 [id number]  Diastolic BP 84 80 82  Wt. (Lbs) 218 219.6 215  BMI 36.28 kg/m2 36.54 kg/m2 36.9 kg/m2    Physical Exam Vitals reviewed.  Constitutional:      Appearance: Normal appearance. She is obese.  Neck:     Vascular: No carotid bruit.  Cardiovascular:     Rate and Rhythm: Normal rate and regular rhythm.     Heart sounds: Normal heart sounds.  Pulmonary:     Effort: Pulmonary effort is normal.     Breath sounds: Normal breath sounds.  Abdominal:     General: Abdomen is  flat. Bowel sounds are normal.     Palpations: Abdomen is soft.     Tenderness: There is no abdominal tenderness.  Neurological:     Mental Status: She is alert and oriented to person, place, and time.  Psychiatric:        Mood and Affect: Mood normal.        Behavior: Behavior normal.     Diabetic Foot Exam - Simple   No data filed      Lab Results  Component Value Date   WBC 5.7 02/21/2022   HGB 14.2 02/21/2022   HCT 42.6 02/21/2022   PLT 229 02/21/2022   GLUCOSE 94 02/21/2022   CHOL 117 02/21/2022   TRIG 82 02/21/2022   HDL 43 02/21/2022   LDLCALC 58 02/21/2022   ALT 19 02/21/2022   AST 21 02/21/2022   NA 141 02/21/2022   K 5.1 02/21/2022   CL 102 02/21/2022   CREATININE 1.18 (H) 02/21/2022   BUN 14 02/21/2022   CO2 22 02/21/2022   TSH 4.270 12/17/2020   INR 0.94 12/22/2014      Assessment & Plan:    Atherosclerosis  of aorta [facility]) Assessment & Plan: The current medical regimen is effective;  continue present plan and medications. Taking Repatha 140 mg every 14 days and Fish Oil one daily. Recommend continue to work on eating healthy diet and exercise.   Orders: -     Cardiovascular Risk Assessment  Malignant neoplasm of transverse colon ([place]) Assessment & Plan: Stable.  Management per specialist.   GERD without esophagitis Assessment & Plan: The current medical regimen is effective;  continue present plan and medications. Continue omeprazole 40 mg daily.    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  No changes to medicines. Continue losartan 50 mg daily  Continue to work on eating a healthy diet and exercise.  Labs drawn today.    Orders: -     Comprehensive metabolic panel -     CBC with Differential/Platelet  Mixed hyperlipidemia Assessment & Plan: Well controlled.  No changes to medicines. Continue Repatha 140 mg every 14 days, Fish Oil daily. Continue to work on eating a healthy  diet and exercise.  Labs drawn today.   Orders: -     Lipid panel  Colon cancer metastasized to intra-abdominal lymph node [facility]) Assessment & Plan: Management per specialist.  Monitoring.   Drug-induced polyneuropathy ([place]) Assessment & Plan: No medications at this time.    Class 2 severe obesity due to excess calories with serious comorbidity and body mass index (BMI) of 36.0 to 36.9 in adult [facility]) Assessment & Plan: Recommend continue to work on eating healthy diet and exercise. Comorbidities: hypertension, hyperlipidemia    Chronic kidney disease, stage 3a ([place]) Assessment & Plan: Stable. No nsaids.       No orders of the defined types were placed in this encounter.   Orders Placed This Encounter  Procedures   Comprehensive metabolic panel   Lipid panel   CBC with Differential/Platelet   Cardiovascular Risk Assessment     Follow-up: Return in about 4 months (around 06/22/2022) for Fasting.   I,Marla I Leal-Borjas,acting as a scribe for Rochel Brome, MD.,have documented all relevant documentation on the behalf of Rochel Brome, MD,as directed by  Rochel Brome, MD while in the presence of Rochel Brome, MD.   An After Visit Summary was printed and given to the patient.       Progress Notes  Nursing  02/21/2022 09:00 AM   I attest that I have reviewed this visit and agree with the plan scribed by my staff.   Rochel Brome, MD [facility] [phone]

## 2022-02-21 ENCOUNTER — Encounter: Payer: Self-pay | Admitting: Family Medicine

## 2022-02-21 ENCOUNTER — Ambulatory Visit: Payer: 59 | Admitting: Family Medicine

## 2022-02-21 ENCOUNTER — Encounter: Payer: Self-pay | Admitting: Oncology

## 2022-02-21 VITALS — BP 126/84 | HR 76 | Temp 97.5°F | Ht 65.0 in | Wt 218.0 lb

## 2022-02-21 DIAGNOSIS — C189 Malignant neoplasm of colon, unspecified: Secondary | ICD-10-CM

## 2022-02-21 DIAGNOSIS — I700 Atherosclerosis of aorta: Secondary | ICD-10-CM | POA: Diagnosis not present

## 2022-02-21 DIAGNOSIS — I129 Hypertensive chronic kidney disease with stage 1 through stage 4 chronic kidney disease, or unspecified chronic kidney disease: Secondary | ICD-10-CM | POA: Diagnosis not present

## 2022-02-21 DIAGNOSIS — G620 Drug-induced polyneuropathy: Secondary | ICD-10-CM

## 2022-02-21 DIAGNOSIS — C184 Malignant neoplasm of transverse colon: Secondary | ICD-10-CM

## 2022-02-21 DIAGNOSIS — C772 Secondary and unspecified malignant neoplasm of intra-abdominal lymph nodes: Secondary | ICD-10-CM

## 2022-02-21 DIAGNOSIS — K219 Gastro-esophageal reflux disease without esophagitis: Secondary | ICD-10-CM | POA: Diagnosis not present

## 2022-02-21 DIAGNOSIS — N1831 Chronic kidney disease, stage 3a: Secondary | ICD-10-CM

## 2022-02-21 DIAGNOSIS — E782 Mixed hyperlipidemia: Secondary | ICD-10-CM

## 2022-02-21 DIAGNOSIS — Z6836 Body mass index (BMI) 36.0-36.9, adult: Secondary | ICD-10-CM

## 2022-02-21 NOTE — Assessment & Plan Note (Addendum)
Well controlled.  No changes to medicines. Continue losartan 50 mg daily  Continue to work on eating a healthy diet and exercise.  Labs drawn today.

## 2022-02-21 NOTE — Assessment & Plan Note (Signed)
The current medical regimen is effective;  continue present plan and medications. Continue omeprazole 40 mg daily.

## 2022-02-21 NOTE — Assessment & Plan Note (Addendum)
The current medical regimen is effective;  continue present plan and medications. Taking Repatha 140 mg every 14 days and Fish Oil one daily. Recommend continue to work on eating healthy diet and exercise.

## 2022-02-21 NOTE — Assessment & Plan Note (Addendum)
Stable. Management per specialist.   

## 2022-02-21 NOTE — Assessment & Plan Note (Signed)
Well controlled.  No changes to medicines. Continue Repatha 140 mg every 14 days, Fish Oil daily. Continue to work on eating a healthy diet and exercise.  Labs drawn today.

## 2022-02-22 LAB — CBC WITH DIFFERENTIAL/PLATELET
Basophils Absolute: 0.1 10*3/uL (ref 0.0–0.2)
Basos: 1 %
EOS (ABSOLUTE): 0.2 10*3/uL (ref 0.0–0.4)
Eos: 4 %
Hematocrit: 42.6 % (ref 34.0–46.6)
Hemoglobin: 14.2 g/dL (ref 11.1–15.9)
Immature Grans (Abs): 0 10*3/uL (ref 0.0–0.1)
Immature Granulocytes: 0 %
Lymphocytes Absolute: 2.3 10*3/uL (ref 0.7–3.1)
Lymphs: 40 %
MCH: 27.3 pg (ref 26.6–33.0)
MCHC: 33.3 g/dL (ref 31.5–35.7)
MCV: 82 fL (ref 79–97)
Monocytes Absolute: 0.4 10*3/uL (ref 0.1–0.9)
Monocytes: 8 %
Neutrophils Absolute: 2.7 10*3/uL (ref 1.4–7.0)
Neutrophils: 47 %
Platelets: 229 10*3/uL (ref 150–450)
RBC: 5.21 x10E6/uL (ref 3.77–5.28)
RDW: 13.4 % (ref 11.7–15.4)
WBC: 5.7 10*3/uL (ref 3.4–10.8)

## 2022-02-22 LAB — CARDIOVASCULAR RISK ASSESSMENT

## 2022-02-22 LAB — COMPREHENSIVE METABOLIC PANEL
ALT: 19 IU/L (ref 0–32)
AST: 21 IU/L (ref 0–40)
Albumin/Globulin Ratio: 1.7 (ref 1.2–2.2)
Albumin: 4.7 g/dL (ref 3.8–4.9)
Alkaline Phosphatase: 92 IU/L (ref 44–121)
BUN/Creatinine Ratio: 12 (ref 9–23)
BUN: 14 mg/dL (ref 6–24)
Bilirubin Total: 0.4 mg/dL (ref 0.0–1.2)
CO2: 22 mmol/L (ref 20–29)
Calcium: 10 mg/dL (ref 8.7–10.2)
Chloride: 102 mmol/L (ref 96–106)
Creatinine, Ser: 1.18 mg/dL — ABNORMAL HIGH (ref 0.57–1.00)
Globulin, Total: 2.8 g/dL (ref 1.5–4.5)
Glucose: 94 mg/dL (ref 70–99)
Potassium: 5.1 mmol/L (ref 3.5–5.2)
Sodium: 141 mmol/L (ref 134–144)
Total Protein: 7.5 g/dL (ref 6.0–8.5)
eGFR: 56 mL/min/{1.73_m2} — ABNORMAL LOW (ref >59)

## 2022-02-22 LAB — LIPID PANEL
Chol/HDL Ratio: 2.7 ratio (ref 0.0–4.4)
Cholesterol, Total: 117 mg/dL (ref 100–199)
HDL: 43 mg/dL (ref >39)
LDL Chol Calc (NIH): 58 mg/dL (ref 0–99)
Triglycerides: 82 mg/dL (ref 0–149)
VLDL Cholesterol Cal: 16 mg/dL (ref 5–40)

## 2022-02-24 NOTE — Assessment & Plan Note (Signed)
The current medical regimen is effective;  continue present plan and medications.  Losartan 5 mg daily.

## 2022-02-26 DIAGNOSIS — N1831 Chronic kidney disease, stage 3a: Secondary | ICD-10-CM | POA: Insufficient documentation

## 2022-02-26 NOTE — Assessment & Plan Note (Signed)
No medications at this time.

## 2022-02-26 NOTE — Assessment & Plan Note (Signed)
Recommend continue to work on eating healthy diet and exercise. Comorbidities: hypertension, hyperlipidemia

## 2022-02-26 NOTE — Assessment & Plan Note (Signed)
Stable. No nsaids.  ?

## 2022-02-26 NOTE — Assessment & Plan Note (Signed)
Management per specialist.  Monitoring.

## 2022-02-28 ENCOUNTER — Encounter: Payer: Self-pay | Admitting: Oncology

## 2022-04-08 ENCOUNTER — Encounter: Payer: Self-pay | Admitting: Family Medicine

## 2022-04-10 ENCOUNTER — Other Ambulatory Visit: Payer: Self-pay

## 2022-04-10 MED ORDER — REPATHA SURECLICK 140 MG/ML ~~LOC~~ SOAJ: 140.0000 mg | SUBCUTANEOUS | 0 refills | Status: DC

## 2022-05-10 ENCOUNTER — Other Ambulatory Visit: Payer: Self-pay | Admitting: Family Medicine

## 2022-06-22 NOTE — Progress Notes (Unsigned)
Subjective:  Patient ID: Brenda Becker, female    DOB: 10/13/1970  Age: 52 y.o. MRN: [id number]  Chief Complaint  Patient presents with   Medical Management of Chronic Issues    HPI Hypertensive with ckd 3A: Taking Losartan 50 mg daily.  Hyperlipidemia: Takes Repatha 140 mg every 14 days, Fish Oil daily.  GERD: Currently taking Omeprazole 40 mg daily.    Colon cancer: stable. Monitored by oncology. Chemo induced neuropathy.   Obesity: Eats healthy. Walks a lot at work.        06/23/2022    8:44 AM 02/21/2022    9:08 AM 07/11/2021    7:47 AM 06/07/2020    4:03 PM 04/07/2019    8:27 AM  Depression screen PHQ 2/9  Decreased Interest 2 0 0 0 0  Down, Depressed, Hopeless 0 0 0 0 0  PHQ - 2 Score 2 0 0 0 0  Altered sleeping 3   0   Tired, decreased energy 3   0   Change in appetite 1   0   Feeling bad or failure about yourself  0   0   Trouble concentrating 0   0   Moving slowly or fidgety/restless 0   0   Suicidal thoughts 0   0   PHQ-9 Score 9   0   Difficult doing work/chores Not difficult at all   Not difficult at all         06/23/2022    8:44 AM  Fall Risk   Falls in the past year? 0  Number falls in past yr: 0  Injury with Fall? 0  Risk for fall due to : No Fall Risks  Follow up Falls evaluation completed;Falls prevention discussed    Patient Care Team: Blane Ohara, MD as PCP - General (Family Medicine) Genia Del, MD as Consulting Physician (Obstetrics and Gynecology)   Review of Systems  Constitutional:  Negative for chills, fatigue and fever.  HENT:  Negative for congestion, ear pain, rhinorrhea and sore throat.   Respiratory:  Negative for cough and shortness of breath.   Cardiovascular:  Negative for chest pain.  Gastrointestinal:  Negative for abdominal pain, constipation, diarrhea, nausea and vomiting.  Genitourinary:  Negative for dysuria and urgency.  Musculoskeletal:  Positive for back pain. Negative for myalgias.  Neurological:  Positive  for numbness (chemo induced neuropathy). Negative for dizziness, weakness, light-headedness and headaches.  Psychiatric/Behavioral:  Positive for dysphoric mood. The patient is not nervous/anxious.     Current Outpatient Medications on File Prior to Visit  Medication Sig Dispense Refill   Evolocumab (REPATHA SURECLICK) 140 MG/ML SOAJ Inject 140 mg into the skin every 14 (fourteen) days. 6 mL 0   losartan (COZAAR) 50 MG tablet Take 1 tablet (50 mg total) by mouth daily. 30 tablet 2   Omega-3 Fatty Acids (FISH OIL PO) Take 1,080 mg by mouth at bedtime.     omeprazole (PRILOSEC) 40 MG capsule Take 1 capsule (40 mg total) by mouth daily. 90 capsule 3   Turmeric 500 MG TABS Take 2 tablets by mouth daily.     valACYclovir (VALTREX) 1000 MG tablet Take 2 tablets (2,000 mg total) by mouth 2 (two) times daily. 20 tablet 1   Current Facility-Administered Medications on File Prior to Visit  Medication Dose Route Frequency Provider Last Rate Last Admin   sodium chloride flush (NS) 0.9 % injection 10 mL  10 mL Intravenous PRN Ladene Artist, MD   10  mL at 05/28/15 0956   Past Medical History:  Diagnosis Date   Abnormal uterine bleeding (AUB)    CKD (chronic kidney disease), stage II    Endometrial polyp    Familial hyperlipidemia, high LDL 04/07/2019   Family history of cancer 01/25/2015   GAD (generalized anxiety disorder)    GERD (gastroesophageal reflux disease)    Hepatitis 02/26/2017   History of hepatitis 04/2017   IMMUNOTHERAPY INDUCED HEPATITIS,  TREATED   Hypomagnesemia    Malignant neoplasm of transverse colon ([facility]) 2016   dx 2016,  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;  11-16-2014  partial transerve colectomy w/ node dissection @ [facility];  started chemo 12-30-2014;   followed by dr Truett Perna ([facility]) and Dr h. Zada Girt [facility])   Mixed hyperlipidemia    Myalgia due to statin 09/12/2020   Peripheral neuropathy due to chemotherapy ([facility])     06-14-2020  per pt right arm to fingers   PONV (postoperative nausea and vomiting)    vomit with Gallbladder surgery   Port catheter in place 08/20/2015   Vitamin D deficiency    Wears glasses    Past Surgical History:  Procedure Laterality Date   BREAST BIOPSY Right 2019   per pt benign   COLON SURGERY  11/16/2014   @[facility] ---  partial transverse colectomy w/ node dissection   COLONOSCOPY  10/19/2014   Colonic mass (biopsied and tattooed) Small internal hemorrhoids.   DILATATION & CURETTAGE/HYSTEROSCOPY WITH MYOSURE N/A 06/16/2020   Procedure: DILATATION & CURETTAGE/HYSTEROSCOPY WITH MYOSURE EXCISION;  Surgeon: Genia Del, MD;  Location: [facility];  Service: Gynecology;  Laterality: N/A;   EUS N/A 02/17/2016   Procedure: UPPER ENDOSCOPIC ULTRASOUND (EUS) RADIAL;  Surgeon: Rachael Fee, MD;  Location: [facility];  Service: Endoscopy;  Laterality: N/A;   EUS N/A 07/20/2016   Procedure: UPPER ENDOSCOPIC ULTRASOUND (EUS) RADIAL;  Surgeon: Rachael Fee, MD;  [facility];  Service: Endoscopy;  Laterality: N/A;   KNEE ARTHROSCOPY Right 2012   LAPAROSCOPIC CHOLECYSTECTOMY  2003   PORTA CATH INSERTION  12/22/2014   TONSILLECTOMY  1994    Family History  Problem Relation Age of Onset   Other Father        doesn't go to doctor or get colonoscopies   Other Brother        stomach issues; has had early colonoscopy   Other Maternal Aunt        won't go to doctor; probably has not had colonoscopy   Cervical cancer Paternal Aunt 84       treated at [facility]   Throat cancer Paternal Aunt 46       smoker   Skin cancer Maternal Grandmother        non-melanoma; dx. late 60s   Prostate cancer Maternal Grandfather        dx. late 33s   Breast cancer Paternal Grandmother        dx. 51s   Cancer Cousin        "some kind of stomach cancer" dx. early 43s   Stomach cancer Other        dx. "older"   Colon cancer Other        dx. 60s   Cancer Other         "female cancer" dx. "later age";    Breast cancer Other        dx. 50s-60s   Ulcerative colitis Neg Hx  Social History   Socioeconomic History   Marital status: Married    Spouse name: Not on file   Number of children: 1   Years of education: Not on file   Highest education level: Not on file  Occupational History   Occupation: Production designer, theatre/television/film    Comment: Airline pilot  Tobacco Use   Smoking status: Never   Smokeless tobacco: Never  Vaping Use   Vaping Use: Never used  Substance and Sexual Activity   Alcohol use: No   Drug use: Never   Sexual activity: Not Currently    Partners: Male    Birth control/protection: Abstinence    Comment: MARRIED- 19 YRS  Other Topics Concern   Not on file  Social History Narrative   Not on file   Social Determinants of Health   Financial Resource Strain: Low Risk  (06/23/2022)   Overall Financial Resource Strain (CARDIA)    Difficulty of Paying Living Expenses: Not hard at all  Food Insecurity: No Food Insecurity (06/23/2022)   Hunger Vital Sign    Worried About Running Out of Food in the Last Year: Never true    Ran Out of Food in the Last Year: Never true  Transportation Needs: No Transportation Needs (06/23/2022)   PRAPARE - Administrator, Civil Service (Medical): No    Lack of Transportation (Non-Medical): No  Physical Activity: Insufficiently Active (06/23/2022)   Exercise Vital Sign    Days of Exercise per Week: 4 days    Minutes of Exercise per Session: 30 min  Stress: No Stress Concern Present (06/23/2022)   [employer] of Occupational Health - Occupational Stress Questionnaire    Feeling of Stress : Not at all  Social Connections: Socially Integrated (06/23/2022)   Social Connection and Isolation Panel [NHANES]    Frequency of Communication with Friends and Family: More than three times a week    Frequency of Social Gatherings with Friends and Family: Three times a week    Attends Religious Services: More than 4  times per year    Active Member of Clubs or Organizations: No    Attends Engineer, structural: More than 4 times per year    Marital Status: Married    Objective:  BP 110/70   Pulse (!) 56   Temp (!) 97.5 F (36.4 C)   Resp 14   Ht 5\' 5"  (1.651 m)   Wt 225 lb (102.1 kg)   BMI 37.44 kg/m      06/23/2022    8:42 AM 02/21/2022    9:05 AM 10/18/2021    7:39 AM  BP/Weight  Systolic BP 110 126 122  Diastolic BP 70 84 80  Wt. (Lbs) 225 218 219.6  BMI 37.44 kg/m2 36.28 kg/m2 36.54 kg/m2    Physical Exam Vitals reviewed.  Constitutional:      Appearance: Normal appearance. She is normal weight.  Neck:     Vascular: No carotid bruit.  Cardiovascular:     Rate and Rhythm: Normal rate and regular rhythm.     Heart sounds: Normal heart sounds.  Pulmonary:     Effort: Pulmonary effort is normal. No respiratory distress.     Breath sounds: Normal breath sounds.  Abdominal:     General: Abdomen is flat. Bowel sounds are normal.     Palpations: Abdomen is soft.     Tenderness: There is no abdominal tenderness.  Neurological:     Mental Status: She is alert and oriented to person,  place, and time.  Psychiatric:        Mood and Affect: Mood normal.        Behavior: Behavior normal.     Diabetic Foot Exam - Simple   No data filed      Lab Results  Component Value Date   WBC 5.9 06/23/2022   HGB 13.3 06/23/2022   HCT 40.1 06/23/2022   PLT 194 06/23/2022   GLUCOSE 94 06/23/2022   CHOL 125 06/23/2022   TRIG 99 06/23/2022   HDL 48 06/23/2022   LDLCALC 58 06/23/2022   ALT 22 06/23/2022   AST 20 06/23/2022   NA 142 06/23/2022   K 4.5 06/23/2022   CL 106 06/23/2022   CREATININE 1.05 (H) 06/23/2022   BUN 13 06/23/2022   CO2 22 06/23/2022   TSH 4.270 12/17/2020   INR 0.94 12/22/2014      Assessment & Plan:    Atherosclerosis of aorta ([facility]) Assessment & Plan: The current medical regimen is effective;  continue present plan and medications. Taking  Repatha 140 mg every 14 days and Fish Oil one daily. Recommend continue to work on eating healthy diet and exercise.    Mixed hyperlipidemia Assessment & Plan: Well controlled.  No changes to medicines. Continue Repatha 140 mg every 14 days, Fish Oil daily. Continue to work on eating a healthy diet and exercise.  Labs drawn today.   Orders: -     Comprehensive metabolic panel -     Lipid panel  Chronic kidney disease, stage 3a ([facility]) Assessment & Plan: Stable. No nsaids.   Orders: -     CBC with Differential/Platelet  Immunization due -     Varicella-zoster vaccine IM     No orders of the defined types were placed in this encounter.   Orders Placed This Encounter  Procedures   Zoster Recombinant (Shingrix )   CBC with Differential/Platelet   Comprehensive metabolic panel   Lipid panel     Follow-up: No follow-ups on file.   I,Katherina A Bramblett,acting as a scribe for Blane Ohara, MD.,have documented all relevant documentation on the behalf of Blane Ohara, MD,as directed by  Blane Ohara, MD while in the presence of Blane Ohara, MD.   Clayborn Bigness I Leal-Borjas,acting as a scribe for Blane Ohara, MD.,have documented all relevant documentation on the behalf of Blane Ohara, MD,as directed by  Blane Ohara, MD while in the presence of Blane Ohara, MD.    An After Visit Summary was printed and given to the patient.  Blane Ohara, MD [facility] [phone]

## 2022-06-23 ENCOUNTER — Ambulatory Visit: Payer: 59 | Admitting: Family Medicine

## 2022-06-23 ENCOUNTER — Encounter: Payer: Self-pay | Admitting: Family Medicine

## 2022-06-23 VITALS — BP 110/70 | HR 56 | Temp 97.5°F | Resp 14 | Ht 65.0 in | Wt 225.0 lb

## 2022-06-23 DIAGNOSIS — I700 Atherosclerosis of aorta: Secondary | ICD-10-CM

## 2022-06-23 DIAGNOSIS — N1831 Chronic kidney disease, stage 3a: Secondary | ICD-10-CM

## 2022-06-23 DIAGNOSIS — Z23 Encounter for immunization: Secondary | ICD-10-CM | POA: Diagnosis not present

## 2022-06-23 DIAGNOSIS — E782 Mixed hyperlipidemia: Secondary | ICD-10-CM

## 2022-06-23 LAB — CBC WITH DIFFERENTIAL/PLATELET
Basophils Absolute: 0.1 10*3/uL (ref 0.0–0.2)
Basos: 1 %
EOS (ABSOLUTE): 0.4 10*3/uL (ref 0.0–0.4)
Eos: 7 %
Hematocrit: 40.1 % (ref 34.0–46.6)
Hemoglobin: 13.3 g/dL (ref 11.1–15.9)
Immature Grans (Abs): 0 10*3/uL (ref 0.0–0.1)
Immature Granulocytes: 0 %
Lymphocytes Absolute: 2.4 10*3/uL (ref 0.7–3.1)
Lymphs: 40 %
MCH: 27.4 pg (ref 26.6–33.0)
MCHC: 33.2 g/dL (ref 31.5–35.7)
MCV: 83 fL (ref 79–97)
Monocytes Absolute: 0.5 10*3/uL (ref 0.1–0.9)
Monocytes: 9 %
Neutrophils Absolute: 2.5 10*3/uL (ref 1.4–7.0)
Neutrophils: 43 %
Platelets: 194 10*3/uL (ref 150–450)
RBC: 4.86 x10E6/uL (ref 3.77–5.28)
RDW: 13.8 % (ref 11.7–15.4)
WBC: 5.9 10*3/uL (ref 3.4–10.8)

## 2022-06-23 LAB — COMPREHENSIVE METABOLIC PANEL
ALT: 22 IU/L (ref 0–32)
AST: 20 IU/L (ref 0–40)
Albumin: 4.4 g/dL (ref 3.8–4.9)
Alkaline Phosphatase: 81 IU/L (ref 44–121)
BUN/Creatinine Ratio: 12 (ref 9–23)
BUN: 13 mg/dL (ref 6–24)
Bilirubin Total: 0.5 mg/dL (ref 0.0–1.2)
CO2: 22 mmol/L (ref 20–29)
Calcium: 9.5 mg/dL (ref 8.7–10.2)
Chloride: 106 mmol/L (ref 96–106)
Creatinine, Ser: 1.05 mg/dL — ABNORMAL HIGH (ref 0.57–1.00)
Globulin, Total: 2.6 g/dL (ref 1.5–4.5)
Glucose: 94 mg/dL (ref 70–99)
Potassium: 4.5 mmol/L (ref 3.5–5.2)
Sodium: 142 mmol/L (ref 134–144)
Total Protein: 7 g/dL (ref 6.0–8.5)
eGFR: 64 mL/min/{1.73_m2} (ref >59)

## 2022-06-23 LAB — LIPID PANEL
Chol/HDL Ratio: 2.6 ratio (ref 0.0–4.4)
Cholesterol, Total: 125 mg/dL (ref 100–199)
HDL: 48 mg/dL (ref >39)
LDL Chol Calc (NIH): 58 mg/dL (ref 0–99)
Triglycerides: 99 mg/dL (ref 0–149)
VLDL Cholesterol Cal: 19 mg/dL (ref 5–40)

## 2022-06-24 NOTE — Assessment & Plan Note (Signed)
The current medical regimen is effective;  continue present plan and medications. Taking Repatha 140 mg every 14 days and Fish Oil one daily. Recommend continue to work on eating healthy diet and exercise.  

## 2022-06-24 NOTE — Assessment & Plan Note (Signed)
Stable. No nsaids.  ?

## 2022-06-24 NOTE — Assessment & Plan Note (Signed)
Well controlled.  No changes to medicines. Continue Repatha 140 mg every 14 days, Fish Oil daily. Continue to work on eating a healthy diet and exercise.  Labs drawn today.  

## 2022-07-03 ENCOUNTER — Other Ambulatory Visit: Payer: Self-pay | Admitting: Family Medicine

## 2022-07-03 DIAGNOSIS — Z1231 Encounter for screening mammogram for malignant neoplasm of breast: Secondary | ICD-10-CM

## 2022-07-10 ENCOUNTER — Other Ambulatory Visit: Payer: Self-pay | Admitting: Family Medicine

## 2022-08-30 ENCOUNTER — Ambulatory Visit: Payer: 59 | Admitting: Obstetrics & Gynecology

## 2022-09-06 ENCOUNTER — Ambulatory Visit
Admission: RE | Admit: 2022-09-06 | Discharge: 2022-09-06 | Disposition: A | Discharge status: Home / Self Care | Payer: 59 | Source: Ambulatory Visit | Attending: Family Medicine | Admitting: Family Medicine

## 2022-09-06 DIAGNOSIS — Z1231 Encounter for screening mammogram for malignant neoplasm of breast: Secondary | ICD-10-CM

## 2022-09-12 ENCOUNTER — Other Ambulatory Visit: Payer: Self-pay | Admitting: Family Medicine

## 2022-10-02 ENCOUNTER — Other Ambulatory Visit: Payer: Self-pay | Admitting: Family Medicine

## 2022-10-04 ENCOUNTER — Encounter: Payer: Self-pay | Admitting: Obstetrics and Gynecology

## 2022-10-04 ENCOUNTER — Other Ambulatory Visit (HOSPITAL_COMMUNITY)
Admission: RE | Admit: 2022-10-04 | Discharge: 2022-10-04 | Disposition: A | Discharge status: Home / Self Care | Payer: 59 | Source: Ambulatory Visit | Attending: Obstetrics and Gynecology | Admitting: Obstetrics and Gynecology

## 2022-10-04 ENCOUNTER — Ambulatory Visit (INDEPENDENT_AMBULATORY_CARE_PROVIDER_SITE_OTHER): Payer: 59 | Admitting: Obstetrics and Gynecology

## 2022-10-04 ENCOUNTER — Encounter: Payer: Self-pay | Admitting: Oncology

## 2022-10-04 VITALS — BP 124/84 | HR 81 | Ht 64.0 in | Wt 227.0 lb

## 2022-10-04 DIAGNOSIS — Z01419 Encounter for gynecological examination (general) (routine) without abnormal findings: Secondary | ICD-10-CM | POA: Insufficient documentation

## 2022-10-04 DIAGNOSIS — N951 Menopausal and female climacteric states: Secondary | ICD-10-CM

## 2022-10-04 DIAGNOSIS — N181 Chronic kidney disease, stage 1: Secondary | ICD-10-CM

## 2022-10-04 DIAGNOSIS — E2839 Other primary ovarian failure: Secondary | ICD-10-CM

## 2022-10-04 NOTE — Progress Notes (Signed)
Brenda Becker [birth year] [id number]   History:    52 y.o. G1P1L1 Married.  Daughter is 53 yo   RP:  Established patient presenting for annual gyn exam a H/o HSC/Myosure Excision/D+C on 06/16/2020     Component 2 yr ago  SURGICAL PATHOLOGY SURGICAL PATHOLOGY CASE: [id number] PATIENT: Brenda Becker Surgical Pathology Report     Clinical History: Endometrial polyp (crm)     FINAL MICROSCOPIC DIAGNOSIS:  A. ENDOMETRIUM, POLYP, CURETTAGE: - Benign endometrial polyp - Negative for hyperplasia or malignancy         HPI:  Postmenopause.  No PMB x removal of the Endometrial Polyp which was benign.  Abstinent x 3 years.  Pap Neg 05/2020.  Pap reflex today.  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 Patient has cold sores around the mouth occasionally, never had genital HSV.   No pelvic pain.  Chemotherapy for colon Cancer x 6 years.  Colonoscopy 12/2020 Neg.  Breasts normal.  Mammo Neg 5/24  Urine and bowel movements currently normal.  Body mass index Body mass index is 38.96 kg/m.'.  Health [facility] Dr Brenda Course MD.  Bone risk: mother with osteoporosis, early menopause in mid 47's, no fractures, CKD, 5 years of chemotherapy  US Transvaginal Non-OB (Accession [id number]) (Order [id number]) Imaging Date: 04/07/2020 Department: [facility] Released By: Brenda Becker, CMA Authorizing: Brenda Del, MD   Exam Status  Status  Final [99]   PACS Intelerad Image Link   Show images for US Transvaginal Non-OB Study Result  Narrative & Impression   T/V images.  Normal uterine size and shape with no myometrial mass, the uterus is measured at 10.6 x 7.42 x 6.19 cm.  The endometrial lining is thickened at 3.4 cm and is hypervascular in areas with no definite mass seen.  The right ovary is normal (Cyst resolved).  The left ovary presents a 2.7 cm simple cyst which is decreased in size since January 2022 ultrasound.  Normal blood flow to both ovaries.   No adnexal mass seen.  No free fluid in the posterior cul-de-sac.      Blood pressure 124/84, pulse 81, height 5\' 4"  (1.626 m), weight 227 lb (103 kg), SpO2 99%.     Component Value Date/Time   DIAGPAP  08/22/2021 1532    - Negative for Intraepithelial Lesions or Malignancy (NILM)   DIAGPAP - Benign reactive/reparative changes 08/22/2021 1532   ADEQPAP  08/22/2021 1532    Satisfactory for evaluation; transformation zone component PRESENT.    GYN HISTORY:    Component Value Date/Time   DIAGPAP  08/22/2021 1532    - Negative for Intraepithelial Lesions or Malignancy (NILM)   DIAGPAP - Benign reactive/reparative changes 08/22/2021 1532   ADEQPAP  08/22/2021 1532    Satisfactory for evaluation; transformation zone component PRESENT.    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   Family history of cancer 01/25/2015   GAD (generalized anxiety  disorder)    GERD (gastroesophageal reflux disease)    Hepatitis 02/26/2017   History of hepatitis 04/2017   IMMUNOTHERAPY INDUCED HEPATITIS,  TREATED   Hypomagnesemia    Malignant neoplasm of transverse colon (HCC) 2016   dx 2016,  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;  11-16-2014  partial transerve colectomy w/ node dissection @ [facility];  started chemo 12-30-2014;   followed by dr Brenda [facility] ([facility]) and Dr [facility])   Mixed hyperlipidemia    Myalgia due to statin 09/12/2020   Peripheral neuropathy due to chemotherapy (HCC)    06-14-2020  per pt right arm to fingers   PONV (postoperative nausea and vomiting)    vomit with Gallbladder surgery   Port  catheter in place 08/20/2015   Vitamin D deficiency    Wears glasses     Past Surgical History:  Procedure Laterality Date   BREAST BIOPSY Right 2019   per pt benign   COLON SURGERY  11/16/2014   @[facility] ---  partial transverse colectomy w/ node dissection   COLONOSCOPY  10/19/2014   Colonic mass (biopsied and tattooed) Small internal hemorrhoids.   DILATATION & CURETTAGE/HYSTEROSCOPY WITH MYOSURE N/A 06/16/2020   Procedure: DILATATION & CURETTAGE/HYSTEROSCOPY WITH MYOSURE EXCISION;  Surgeon: Brenda Del, MD;  Location: [facility];  Service: Gynecology;  Laterality: N/A;   EUS N/A 02/17/2016   Procedure: UPPER ENDOSCOPIC ULTRASOUND (EUS) RADIAL;  Surgeon: Brenda Fee, MD;  Location: [facility];  Service: Endoscopy;  Laterality: N/A;   EUS N/A 07/20/2016   Procedure: UPPER ENDOSCOPIC ULTRASOUND (EUS) RADIAL;  Surgeon: Brenda Fee, MD;  Location: [facility];  Service: Endoscopy;  Laterality: N/A;   KNEE ARTHROSCOPY Right 2012   LAPAROSCOPIC CHOLECYSTECTOMY  2003   PORTA CATH INSERTION  12/22/2014   TONSILLECTOMY  1994    Current Outpatient Medications on File Prior to Visit  Medication Sig Dispense Refill   losartan (COZAAR) 50 MG tablet Take 1 tablet by mouth daily. 30 tablet 2   Omega-3 Fatty Acids (FISH OIL PO) Take 1,080 mg by mouth at bedtime.     omeprazole (PRILOSEC) 40 MG capsule Take 1 capsule (40 mg total) by mouth daily. 90 capsule 1   REPATHA SURECLICK 140 MG/ML SOAJ Inject 140 mg into the skin every 14 (fourteen) days. 6 mL 0   Turmeric 500 MG TABS Take 2 tablets by mouth daily.     valACYclovir (VALTREX) 1000 MG tablet Take 2 tablets (2,000 mg total) by mouth 2 (two) times daily. 20 tablet 1   Current Facility-Administered Medications on File Prior to Visit  Medication Dose Route Frequency Provider Last Rate Last Admin   sodium chloride flush (NS) 0.9 % injection 10 mL  10 mL Intravenous PRN Brenda Artist, MD   10 mL at 05/28/15 0956     Social History   Socioeconomic History   Marital status: Married    Spouse name: Not on file   Number of children: 1   Years of education: Not on file   Highest education level: Not on file  Occupational History   Occupation: Production designer, theatre/television/film    Comment: Airline pilot  Tobacco Use   Smoking status: Never   Smokeless tobacco: Never  Vaping Use   Vaping status: Never Used  Substance and Sexual Activity   Alcohol use: No   Drug use: Never   Sexual activity: Not Currently    Partners: Male    Birth  control/protection: Abstinence  Other Topics Concern   Not on file  Social History Narrative   Not on file   Social Determinants of Health   Financial Resource Strain: Low Risk  (06/23/2022)   Overall Financial Resource Strain (CARDIA)    Difficulty of Paying Living Expenses: Not hard at all  Food Insecurity: No Food Insecurity (06/23/2022)   Hunger Vital Sign    Worried About Running Out of Food in the Last Year: Never true    Ran Out of Food in the Last Year: Never true  Transportation Needs: No Transportation Needs (06/23/2022)   PRAPARE - Administrator, Civil Service (Medical): No    Lack of Transportation (Non-Medical): No  Physical Activity: Insufficiently Active (06/23/2022)   Exercise Vital Sign    Days of Exercise per Week: 4 days    Minutes of Exercise per Session: 30 min  Stress: No Stress Concern Present (06/23/2022)   [employer] of Occupational Health - Occupational Stress Questionnaire    Feeling of Stress : Not at all  Social Connections: Socially Integrated (06/23/2022)   Social Connection and Isolation Panel [NHANES]    Frequency of Communication with Friends and Family: More than three times a week    Frequency of Social Gatherings with Friends and Family: Three times a week    Attends Religious Services: More than 4 times per year    Active Member of Clubs or Organizations: No    Attends Banker Meetings: More than 4 times per  year    Marital Status: Married  Catering manager Violence: Not At Risk (06/23/2022)   Humiliation, Afraid, Rape, and Kick questionnaire    Fear of Current or Ex-Partner: No    Emotionally Abused: No    Physically Abused: No    Sexually Abused: No    Family History  Problem Relation Age of Onset   Other Father        doesn't go to doctor or get colonoscopies   Other Brother        stomach issues; has had early colonoscopy   Other Maternal Aunt        won't go to doctor; probably has not had colonoscopy   Cervical cancer Paternal Aunt 41       treated at [facility]   Throat cancer Paternal Aunt 41       smoker   Skin cancer Maternal Grandmother        non-melanoma; dx. late 60s   Prostate cancer Maternal Grandfather        dx. late 9s   Breast cancer Paternal Grandmother        dx. 31s   Cancer Cousin        "some kind of stomach cancer" dx. early 67s   Stomach cancer Other        dx. "older"   Colon cancer Other        dx. 60s   Cancer Other        "female cancer" dx. "later age";    Breast cancer Other        dx. 50s-60s   Ulcerative colitis Neg Hx      Allergies  Allergen Reactions   Crestor [Rosuvastatin Calcium] Other (See Comments)    Muscle pain   Lipitor [Atorvastatin Calcium]     Elevated LFTs    Zetia [Ezetimibe]     Malaise.      Patient's last menstrual period was No LMP recorded. (Menstrual status: Chemotherapy).Marland Kitchen  Review of Systems Alls systems reviewed and are negative.     Physical Exam Vitals and nursing note reviewed. Exam conducted with a chaperone present.  Constitutional:      Appearance: Normal appearance.  HENT:     Head: Normocephalic.  Neck:     Thyroid: No thyroid mass, thyromegaly or thyroid tenderness.  Cardiovascular:     Rate and Rhythm: Normal rate and regular rhythm.  Pulmonary:     Effort: Pulmonary effort is normal.     Breath sounds: Normal breath sounds.  Chest:  Breasts:    Right: Normal. No swelling,  bleeding, mass, nipple discharge, skin change or tenderness.     Left: Normal. No swelling, bleeding, mass, nipple discharge, skin change or tenderness.  Abdominal:     Palpations: Abdomen is soft.     Tenderness: There is no guarding or rebound.  Genitourinary:    General: Normal vulva.     Labia:        Right: No rash, tenderness or lesion.        Left: No rash, tenderness or lesion.      Urethra: No prolapse or urethral pain.     Vagina: Normal.     Cervix: Normal.     Uterus: Normal.      Adnexa: Right adnexa normal and left adnexa normal.  Musculoskeletal:        General: Normal range of motion.     Cervical back: Full passive range of motion without pain and normal range of motion.     Right lower leg: No edema.     Left lower leg: No edema.  Skin:    General: Skin is warm.  Neurological:     Mental Status: She is alert.       A:         Well Woman GYN exam                             P:        Pap smear collected             Encouraged annual mammogram screening             Colonoscopy UTD             Labs and immunizations with her primary             Discussed breast self exams             Encouraged calcium and vit D Return for baseline DXA with risk factors  Earley Favor

## 2022-10-09 LAB — CYTOLOGY - PAP
Comment: NEGATIVE
Diagnosis: NEGATIVE
High risk HPV: NEGATIVE

## 2022-10-13 ENCOUNTER — Other Ambulatory Visit: Payer: Self-pay

## 2022-10-16 NOTE — Assessment & Plan Note (Signed)
Well controlled.  ?No changes to medicines. Continue losartan 50 mg daily.  ?Continue to work on eating a healthy diet and exercise.  ?Labs drawn today.  ? ?

## 2022-10-16 NOTE — Assessment & Plan Note (Signed)
Well controlled.  No changes to medicines. Continue Repatha 140 mg every 14 days, Fish Oil daily. Continue to work on eating a healthy diet and exercise.  Labs drawn today.

## 2022-10-16 NOTE — Progress Notes (Unsigned)
Subjective:  Patient ID: Brenda Becker, female    DOB: August 13, 1970  Age: 52 y.o. MRN: [id number]  Chief Complaint  Patient presents with   Medical Management of Chronic Issues    HPI Hypertensive with ckd 3A: Taking Losartan 50 mg daily.  Hyperlipidemia: Takes Repatha 140 mg every 14 days, Fish Oil daily.  GERD: Currently taking Omeprazole 40 mg daily.    Colon cancer: stable. Monitored by oncology. Chemo induced neuropathy.   Obesity: Eats healthy. Walks a lot at work.        10/17/2022    8:22 AM 06/23/2022    8:44 AM 02/21/2022    9:08 AM 07/11/2021    7:47 AM 06/07/2020    4:03 PM  Depression screen PHQ 2/9  Decreased Interest 0 2 0 0 0  Down, Depressed, Hopeless 0 0 0 0 0  PHQ - 2 Score 0 2 0 0 0  Altered sleeping 0 3   0  Tired, decreased energy 0 3   0  Change in appetite 0 1   0  Feeling bad or failure about yourself  0 0   0  Trouble concentrating 0 0   0  Moving slowly or fidgety/restless 0 0   0  Suicidal thoughts 0 0   0  PHQ-9 Score 0 9   0  Difficult doing work/chores Not difficult at all Not difficult at all   Not difficult at all        10/04/2022    3:02 PM  Fall Risk   Falls in the past year? 0  Number falls in past yr: 0  Injury with Fall? 0  Risk for fall due to : No Fall Risks  Follow up Falls evaluation completed    Patient Care Team: Blane Ohara, MD as PCP - General (Family Medicine) Genia Del, MD as Consulting Physician (Obstetrics and Gynecology)   Review of Systems  Constitutional:  Negative for chills, fatigue and fever.  HENT:  Negative for congestion, ear pain, rhinorrhea and sore throat.   Respiratory:  Negative for cough and shortness of breath.   Cardiovascular:  Negative for chest pain.  Gastrointestinal:  Negative for abdominal pain, constipation, diarrhea, nausea and vomiting.  Genitourinary:  Negative for dysuria and urgency.  Musculoskeletal:  Negative for back pain and myalgias.  Neurological:  Negative for  dizziness, weakness, light-headedness and headaches.  Psychiatric/Behavioral:  Negative for dysphoric mood. The patient is not nervous/anxious.     Current Outpatient Medications on File Prior to Visit  Medication Sig Dispense Refill   losartan (COZAAR) 50 MG tablet Take 1 tablet by mouth daily. 30 tablet 2   Omega-3 Fatty Acids (FISH OIL PO) Take 1,080 mg by mouth at bedtime.     omeprazole (PRILOSEC) 40 MG capsule Take 1 capsule (40 mg total) by mouth daily. 90 capsule 1   REPATHA SURECLICK 140 MG/ML SOAJ Inject 140 mg into the skin every 14 (fourteen) days. 6 mL 0   Turmeric 500 MG TABS Take 2 tablets by mouth daily.     No current facility-administered medications on file prior to visit.   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 [facility])   Mixed hyperlipidemia    Myalgia due to statin 09/12/2020   Peripheral neuropathy due to chemotherapy (HCC)    06-14-2020  per pt right arm to fingers   PONV (postoperative nausea and vomiting)    vomit with Gallbladder surgery   Port catheter in place 08/20/2015   Vitamin D deficiency    Wears glasses    Past Surgical History:  Procedure Laterality Date   BREAST BIOPSY Right 2019   per pt benign   COLON SURGERY  11/16/2014   @[facility] ---  partial transverse colectomy w/ node  dissection   COLONOSCOPY  10/19/2014   Colonic mass (biopsied and tattooed) Small internal hemorrhoids.   DILATATION & CURETTAGE/HYSTEROSCOPY WITH MYOSURE N/A 06/16/2020   Procedure: DILATATION & CURETTAGE/HYSTEROSCOPY WITH MYOSURE EXCISION;  Surgeon: Genia Del, MD;  Location: [facility];  Service: Gynecology;  Laterality: N/A;   EUS N/A 02/17/2016   Procedure: UPPER ENDOSCOPIC ULTRASOUND (EUS) RADIAL;  Surgeon: Rachael Fee, MD;  Location: [facility];  Service: Endoscopy;  Laterality: N/A;   EUS N/A 07/20/2016   Procedure: UPPER ENDOSCOPIC ULTRASOUND (EUS) RADIAL;  Surgeon: Rachael Fee, MD;  Location: [facility];  Service: Endoscopy;  Laterality: N/A;   KNEE ARTHROSCOPY Right 2012   LAPAROSCOPIC CHOLECYSTECTOMY  2003   PORTA CATH INSERTION  12/22/2014   TONSILLECTOMY  1994    Family History  Problem Relation Age of Onset   Other Father        doesn't go to doctor or get colonoscopies   Other Brother        stomach issues; has had early colonoscopy   Other Maternal Aunt        won't go to doctor; probably has not had colonoscopy   Cervical cancer Paternal Aunt 59       treated at [facility]   Throat cancer Paternal Aunt 74       smoker   Skin cancer Maternal Grandmother        non-melanoma; dx. late 60s   Prostate cancer Maternal Grandfather        dx. late 79s   Breast cancer Paternal Grandmother        dx. 75s   Cancer Cousin        "some kind of stomach cancer" dx. early 97s   Stomach cancer Other        dx. "older"   Colon cancer Other        dx. 60s   Cancer Other        "female cancer" dx. "later age";    Breast cancer Other        dx. 55s-60s   Ulcerative colitis Neg Hx    Social History   Socioeconomic History   Marital status: Married    Spouse name: Not on file   Number of children: 1   Years of education: Not on file   Highest education level: Not on file  Occupational History   Occupation: Production designer, theatre/television/film    Comment: Nutritional therapist  Tobacco Use   Smoking status: Never   Smokeless  tobacco: Never  Vaping Use   Vaping status: Never Used  Substance and Sexual Activity   Alcohol use: No   Drug use: Never   Sexual activity: Not Currently    Partners: Male    Birth control/protection: Abstinence  Other Topics Concern   Not on file  Social History Narrative   Not on file   Social Determinants of Health   Financial Resource Strain: Low Risk  (06/23/2022)   Overall Financial Resource Strain (CARDIA)    Difficulty of Paying Living Expenses: Not hard at all  Food Insecurity: No Food Insecurity (06/23/2022)   Hunger Vital Sign    Worried About Running Out of Food in the Last Year: Never true    Ran Out of Food in the Last Year: Never true  Transportation Needs: No Transportation Needs (06/23/2022)   PRAPARE - Administrator, Civil Service (Medical): No    Lack of Transportation (Non-Medical): No  Physical Activity: Insufficiently Active (06/23/2022)   Exercise Vital Sign    Days of Exercise per Week: 4 days    Minutes of Exercise per Session: 30 min  Stress: No Stress Concern Present (06/23/2022)   [employer] of Occupational Health - Occupational Stress Questionnaire    Feeling of Stress : Not at all  Social Connections: Socially Integrated (06/23/2022)   Social Connection and Isolation Panel [NHANES]    Frequency of Communication with Friends and Family: More than three times a week    Frequency of Social Gatherings with Friends and Family: Three times a week    Attends Religious Services: More than 4 times per year    Active Member of Clubs or Organizations: No    Attends Engineer, structural: More than 4 times per year    Marital Status: Married    Objective:  BP 128/84   Pulse 74   Temp (!) 97.5 F (36.4 C)   Ht 5\' 5"  (1.651 m)   Wt 231 lb (104.8 kg)   SpO2 98%   BMI 38.44 kg/m      10/17/2022    8:19 AM 10/04/2022    2:55 PM 06/23/2022    8:42 AM  BP/Weight   Systolic BP 128 124 110  Diastolic BP 84 84 70  Wt. (Lbs) 231 227 225  BMI 38.44 kg/m2 38.96 kg/m2 37.44 kg/m2    Physical Exam Vitals reviewed.  Constitutional:      Appearance: Normal appearance. She is obese.  Neck:     Vascular: No carotid bruit.  Cardiovascular:     Rate and Rhythm: Normal rate and regular rhythm.     Heart sounds: Normal heart sounds.  Pulmonary:     Effort: Pulmonary effort is normal. No respiratory distress.     Breath sounds: Normal breath sounds.  Abdominal:     General: Abdomen is flat. Bowel sounds are normal.     Palpations: Abdomen is soft.     Tenderness: There is no abdominal tenderness.  Neurological:     Mental Status: She is alert and oriented to person, place, and time.  Psychiatric:        Mood and Affect: Mood normal.        Behavior: Behavior normal.     Diabetic Foot Exam - Simple   No data filed      Lab Results  Component Value Date   WBC 5.9 06/23/2022   HGB 13.3 06/23/2022   HCT 40.1 06/23/2022   PLT 194 06/23/2022  GLUCOSE 94 06/23/2022   CHOL 125 06/23/2022   TRIG 99 06/23/2022   HDL 48 06/23/2022   LDLCALC 58 06/23/2022   ALT 22 06/23/2022   AST 20 06/23/2022   NA 142 06/23/2022   K 4.5 06/23/2022   CL 106 06/23/2022   CREATININE 1.05 (H) 06/23/2022   BUN 13 06/23/2022   CO2 22 06/23/2022   TSH 4.270 12/17/2020   INR 0.94 12/22/2014      Assessment & Plan:    Mixed hyperlipidemia Assessment & Plan: Well controlled.  No changes to medicines. Continue Repatha 140 mg every 14 days, Fish Oil daily. Continue to work on eating a healthy diet and exercise.  Labs drawn today.   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  No changes to medicines. Continue losartan 50 mg daily  Continue to work on eating a healthy diet and exercise.  Labs drawn today.    Orders: -     Comprehensive  metabolic panel  GERD without esophagitis Assessment & Plan: The current medical regimen is effective;  continue present plan and medications. Continue omeprazole 40 mg daily.    Immunization due -     Varicella-zoster vaccine IM  Encounter for screening for HIV -     HIV Antibody (routine testing w rflx)  Encounter for hepatitis C screening test for low risk patient -     HCV Ab w Reflex to Quant PCR  Class 2 severe obesity due to excess calories with serious comorbidity and body mass index (BMI) of 38.0 to 38.9 in adult [facility]) Assessment & Plan: Start phentermine.  Healthy diet and exercise.  Follow up in 6 weeks   Orders: -     Phentermine HCl; Take 1 tablet (37.5 mg total) by mouth daily before breakfast.  Dispense: 30 tablet; Refill: 1     Meds ordered this encounter  Medications   phentermine (ADIPEX-P) 37.5 MG tablet    Sig: Take 1 tablet (37.5 mg total) by mouth daily before breakfast.    Dispense:  30 tablet    Refill:  1    Orders Placed This Encounter  Procedures   Zoster Recombinant (Shingrix )   CBC with Differential/Platelet   Comprehensive metabolic panel   Lipid panel   HIV Antibody (routine testing w rflx)   HCV Ab w Reflex to Quant PCR     Follow-up: Return in about 6 weeks (around 11/28/2022) for chronic follow up: wt mgmt.   I,Marla I Leal-Borjas,acting as a scribe for Blane Ohara, MD.,have documented all relevant documentation on the behalf of Blane Ohara, MD,as directed by  Blane Ohara, MD while in the presence of Blane Ohara, MD.   An After Visit Summary was printed and given to the patient.  Blane Ohara, MD [facility] [phone]

## 2022-10-16 NOTE — Assessment & Plan Note (Signed)
The current medical regimen is effective;  continue present plan and medications. Continue omeprazole 40 mg daily.

## 2022-10-17 ENCOUNTER — Ambulatory Visit: Payer: 59 | Admitting: Family Medicine

## 2022-10-17 ENCOUNTER — Encounter: Payer: Self-pay | Admitting: Family Medicine

## 2022-10-17 VITALS — BP 128/84 | HR 74 | Temp 97.5°F | Ht 65.0 in | Wt 231.0 lb

## 2022-10-17 DIAGNOSIS — I129 Hypertensive chronic kidney disease with stage 1 through stage 4 chronic kidney disease, or unspecified chronic kidney disease: Secondary | ICD-10-CM | POA: Diagnosis not present

## 2022-10-17 DIAGNOSIS — E782 Mixed hyperlipidemia: Secondary | ICD-10-CM | POA: Diagnosis not present

## 2022-10-17 DIAGNOSIS — Z23 Encounter for immunization: Secondary | ICD-10-CM | POA: Insufficient documentation

## 2022-10-17 DIAGNOSIS — Z1159 Encounter for screening for other viral diseases: Secondary | ICD-10-CM

## 2022-10-17 DIAGNOSIS — E66812 Obesity, class 2: Secondary | ICD-10-CM | POA: Insufficient documentation

## 2022-10-17 DIAGNOSIS — K219 Gastro-esophageal reflux disease without esophagitis: Secondary | ICD-10-CM | POA: Diagnosis not present

## 2022-10-17 DIAGNOSIS — Z114 Encounter for screening for human immunodeficiency virus [HIV]: Secondary | ICD-10-CM | POA: Insufficient documentation

## 2022-10-17 DIAGNOSIS — Z6838 Body mass index (BMI) 38.0-38.9, adult: Secondary | ICD-10-CM

## 2022-10-17 MED ORDER — PHENTERMINE HCL 37.5 MG PO TABS: 37.5000 mg | ORAL_TABLET | Freq: Every day | ORAL | 1 refills | Status: DC

## 2022-10-17 NOTE — Assessment & Plan Note (Signed)
Start phentermine.  Healthy diet and exercise.  Follow up in 6 weeks

## 2022-10-17 NOTE — Patient Instructions (Addendum)
Weight management: Phentermine 37.5 mg once daily in am.   Ask insurance company about coverage for other weight management medicines: contrave, qsymia, wegovy, or zepbound.

## 2022-10-18 LAB — LIPID PANEL
Chol/HDL Ratio: 3.2 {ratio} (ref 0.0–4.4)
Cholesterol, Total: 136 mg/dL (ref 100–199)
HDL: 43 mg/dL (ref >39)
LDL Chol Calc (NIH): 74 mg/dL (ref 0–99)
Triglycerides: 105 mg/dL (ref 0–149)
VLDL Cholesterol Cal: 19 mg/dL (ref 5–40)

## 2022-10-18 LAB — CBC WITH DIFFERENTIAL/PLATELET
Basophils Absolute: 0.1 10*3/uL (ref 0.0–0.2)
Basos: 1 %
EOS (ABSOLUTE): 0.4 10*3/uL (ref 0.0–0.4)
Eos: 6 %
Hematocrit: 44.6 % (ref 34.0–46.6)
Hemoglobin: 13.9 g/dL (ref 11.1–15.9)
Immature Grans (Abs): 0 10*3/uL (ref 0.0–0.1)
Immature Granulocytes: 0 %
Lymphocytes Absolute: 2.5 10*3/uL (ref 0.7–3.1)
Lymphs: 40 %
MCH: 26.6 pg (ref 26.6–33.0)
MCHC: 31.2 g/dL — ABNORMAL LOW (ref 31.5–35.7)
MCV: 85 fL (ref 79–97)
Monocytes Absolute: 0.5 10*3/uL (ref 0.1–0.9)
Monocytes: 9 %
Neutrophils Absolute: 2.7 10*3/uL (ref 1.4–7.0)
Neutrophils: 44 %
Platelets: 245 10*3/uL (ref 150–450)
RBC: 5.23 x10E6/uL (ref 3.77–5.28)
RDW: 13.5 % (ref 11.7–15.4)
WBC: 6.2 10*3/uL (ref 3.4–10.8)

## 2022-10-18 LAB — HCV INTERPRETATION

## 2022-10-18 LAB — COMPREHENSIVE METABOLIC PANEL
ALT: 22 [IU]/L (ref 0–32)
AST: 20 [IU]/L (ref 0–40)
Albumin: 4.5 g/dL (ref 3.8–4.9)
Alkaline Phosphatase: 78 [IU]/L (ref 44–121)
BUN/Creatinine Ratio: 14 (ref 9–23)
BUN: 16 mg/dL (ref 6–24)
Bilirubin Total: 0.3 mg/dL (ref 0.0–1.2)
CO2: 23 mmol/L (ref 20–29)
Calcium: 9.9 mg/dL (ref 8.7–10.2)
Chloride: 105 mmol/L (ref 96–106)
Creatinine, Ser: 1.15 mg/dL — ABNORMAL HIGH (ref 0.57–1.00)
Globulin, Total: 2.4 g/dL (ref 1.5–4.5)
Glucose: 103 mg/dL — ABNORMAL HIGH (ref 70–99)
Potassium: 5 mmol/L (ref 3.5–5.2)
Sodium: 144 mmol/L (ref 134–144)
Total Protein: 6.9 g/dL (ref 6.0–8.5)
eGFR: 57 mL/min/{1.73_m2} — ABNORMAL LOW (ref >59)

## 2022-10-18 LAB — HCV AB W REFLEX TO QUANT PCR: HCV Ab: NONREACTIVE

## 2022-10-18 LAB — HIV ANTIBODY (ROUTINE TESTING W REFLEX): HIV Screen 4th Generation wRfx: NONREACTIVE

## 2022-11-27 ENCOUNTER — Encounter: Payer: Self-pay | Admitting: Family Medicine

## 2022-11-27 ENCOUNTER — Ambulatory Visit (INDEPENDENT_AMBULATORY_CARE_PROVIDER_SITE_OTHER): Payer: 59 | Admitting: Family Medicine

## 2022-11-27 VITALS — BP 110/80 | HR 85 | Temp 97.5°F | Resp 14 | Ht 65.0 in | Wt 216.0 lb

## 2022-11-27 DIAGNOSIS — E66812 Obesity, class 2: Secondary | ICD-10-CM | POA: Diagnosis not present

## 2022-11-27 DIAGNOSIS — Z6838 Body mass index (BMI) 38.0-38.9, adult: Secondary | ICD-10-CM

## 2022-11-27 MED ORDER — PHENTERMINE HCL 37.5 MG PO TABS
37.5000 mg | ORAL_TABLET | Freq: Every day | ORAL | 1 refills | Status: DC
Start: 2022-11-27 — End: 2022-12-05

## 2022-11-27 NOTE — Assessment & Plan Note (Signed)
Patient has lost 15 pounds on phentermine - Continue phentermine 37.5 mg by mouth once daily in the morning - Continue to work on diet and exercise -FU with Dr. Sedalia Muta as scheduled.

## 2022-11-27 NOTE — Patient Instructions (Signed)
Mediterranean Diet A Mediterranean diet is based on the traditions of countries on the [employer]. It focuses on eating more: Fruits and vegetables. Whole grains, beans, nuts, and seeds. Heart-healthy fats. These are fats that are good for your heart. It involves eating less: Dairy. Meat and eggs. Processed foods with added sugar, salt, and fat. This type of diet can help prevent certain conditions. It can also improve outcomes if you have a long-term (chronic) disease, such as kidney or heart disease. What are tips for following this plan? Reading food labels Check packaged foods for: The serving size. For foods such as rice and pasta, the serving size is the amount of cooked product, not dry. The total fat. Avoid foods with saturated fat or trans fat. Added sugars, such as corn syrup. Shopping  Try to have a balanced diet. Buy a variety of foods, such as: Fresh fruits and vegetables. You may be able to get these from local farmers markets. You can also buy them frozen. Grains, beans, nuts, and seeds. Some of these can be bought in bulk. Fresh seafood. Poultry and eggs. Low-fat dairy products. Buy whole ingredients instead of foods that have already been packaged. If you can't get fresh seafood, buy precooked frozen shrimp or canned fish, such as tuna, salmon, or sardines. Stock your pantry so you always have certain foods on hand, such as olive oil, canned tuna, canned tomatoes, rice, pasta, and beans. Cooking Cook foods with extra-virgin olive oil instead of using butter or other vegetable oils. Have meat as a side dish. Have vegetables or grains as your main dish. This means having meat in small portions or adding small amounts of meat to foods like pasta or stew. Use beans or vegetables instead of meat in common dishes like chili or lasagna. Try out different cooking methods. Try roasting, broiling, steaming, and sauting vegetables. Add frozen vegetables to soups, stews,  pasta, or rice. Add nuts or seeds for added healthy fats and plant protein at each meal. You can add these to yogurt, salads, or vegetable dishes. Marinate fish or vegetables using olive oil, lemon juice, garlic, and fresh herbs. Meal planning Plan to eat a vegetarian meal one day each week. Try to work up to two vegetarian meals, if possible. Eat seafood two or more times a week. Have healthy snacks on hand. These may include: Vegetable sticks with hummus. Greek yogurt. Fruit and nut trail mix. Eat balanced meals. These should include: Fruit: 2-3 servings a day. Vegetables: 4-5 servings a day. Low-fat dairy: 2 servings a day. Fish, poultry, or lean meat: 1 serving a day. Beans and legumes: 2 or more servings a week. Nuts and seeds: 1-2 servings a day. Whole grains: 6-8 servings a day. Extra-virgin olive oil: 3-4 servings a day. Limit red meat and sweets to just a few servings a month. Lifestyle  Try to cook and eat meals with your family. Drink enough fluid to keep your pee (urine) pale yellow. Be active every day. This includes: Aerobic exercise, which is exercise that causes your heart to beat faster. Examples include running and swimming. Leisure activities like gardening, walking, or housework. Get 7-8 hours of sleep each night. Drink red wine if your provider says you can. A glass of wine is 5 oz (150 mL). You may be allowed to have: Up to 1 glass a day if you're female and not pregnant. Up to 2 glasses a day if you're female. What foods should I eat? Fruits Apples. Apricots. Avocado.  Berries. Bananas. Cherries. Dates. Figs. Grapes. Lemons. Melon. Oranges. Peaches. Plums. Pomegranate. Vegetables Artichokes. Beets. Broccoli. Cabbage. Carrots. Eggplant. Green beans. Chard. Kale. Spinach. Onions. Leeks. Peas. Squash. Tomatoes. Peppers. Radishes. Grains Whole-grain pasta. Brown rice. Bulgur wheat. Polenta. Couscous. Whole-wheat bread. Orpah Cobb. Meats and other  proteins Beans. Almonds. Sunflower seeds. Pine nuts. Peanuts. Cod. Salmon. Scallops. Shrimp. Tuna. Tilapia. Clams. Oysters. Eggs. Chicken or [place] without skin. Dairy Low-fat milk. Cheese. Greek yogurt. Fats and oils Extra-virgin olive oil. Avocado oil. Grapeseed oil. Beverages Water. Red wine. Herbal tea. Sweets and desserts Greek yogurt with honey. Baked apples. Poached pears. Trail mix. Seasonings and condiments Basil. Cilantro. Coriander. Cumin. Mint. Parsley. Sage. Rosemary. Tarragon. Garlic. Oregano. Thyme. Pepper. Balsamic vinegar. Tahini. Hummus. Tomato sauce. Olives. Mushrooms. The items listed above may not be all the foods and drinks you can have. Talk to a dietitian to learn more. What foods should I limit? This is a list of foods that should be eaten rarely. Fruits Fruit canned in syrup. Vegetables Deep-fried potatoes, like [place] fries. Grains Packaged pasta or rice dishes. Cereal with added sugar. Snacks with added sugar. Meats and other proteins Beef. Pork. Lamb. Chicken or [place] with skin. Hot dogs. Tomasa Blase. Dairy Ice cream. Sour cream. Whole milk. Fats and oils Butter. Canola oil. Vegetable oil. Beef fat (tallow). Lard. Beverages Juice. Sugar-sweetened soft drinks. Beer. Liquor and spirits. Sweets and desserts Cookies. Cakes. Pies. Candy. Seasonings and condiments Mayonnaise. Pre-made sauces and marinades. The items listed above may not be all the foods and drinks you should limit. Talk to a dietitian to learn more. Where to find more information American Heart Association (AHA): heart.org This information is not intended to replace advice given to you by your health care provider. Make sure you discuss any questions you have with your health care provider. Document Revised: 04/02/2022 Document Reviewed: 04/02/2022 Elsevier Patient Education  2024 [employer].

## 2022-11-27 NOTE — Progress Notes (Addendum)
Subjective:  Patient ID: Brenda Becker, female    DOB: [birth year]  Age: 52 y.o. MRN: [id number]  Chief Complaint  Patient presents with   Weight Check    HPI   Patient is here for weight check. She is taking phentermine 37.5 mg daily. She lost 15 pounds in one month. She is walking for 30 minutes 3 times a week. She is eating healthy. She denies any side effects from medication and is happy with the results. She does admit she has some        10/17/2022    8:22 AM 06/23/2022    8:44 AM 02/21/2022    9:08 AM 07/11/2021    7:47 AM 06/07/2020    4:03 PM  Depression screen PHQ 2/9  Decreased Interest 0 2 0 0 0  Down, Depressed, Hopeless 0 0 0 0 0  PHQ - 2 Score 0 2 0 0 0  Altered sleeping 0 3   0  Tired, decreased energy 0 3   0  Change in appetite 0 1   0  Feeling bad or failure about yourself  0 0   0  Trouble concentrating 0 0   0  Moving slowly or fidgety/restless 0 0   0  Suicidal thoughts 0 0   0  PHQ-9 Score 0 9   0  Difficult doing work/chores Not difficult at all Not difficult at all   Not difficult at all        10/04/2022    3:02 PM  Fall Risk   Falls in the past year? 0  Number falls in past yr: 0  Injury with Fall? 0  Risk for fall due to : No Fall Risks  Follow up Falls evaluation completed    Patient Care Team: Blane Ohara, MD as PCP - General (Family Medicine) Genia Del, MD as Consulting Physician (Obstetrics and Gynecology)   Review of Systems  Constitutional:  Negative for chills, fatigue and fever.  HENT:  Negative for congestion, ear pain, sinus pain and sore throat.        Dry mouth  Respiratory:  Negative for cough and shortness of breath.   Cardiovascular:  Negative for chest pain and palpitations.  Gastrointestinal:  Negative for abdominal pain, constipation, diarrhea, nausea and vomiting.  Endocrine: Negative for polydipsia, polyphagia and polyuria.  Genitourinary:  Negative for difficulty urinating, dysuria, frequency and urgency.   Musculoskeletal:  Negative for arthralgias, back pain and myalgias.  Skin:  Negative for rash.  Neurological:  Negative for dizziness and headaches.  Psychiatric/Behavioral:  Negative for dysphoric mood. The patient is not nervous/anxious.     Current Outpatient Medications on File Prior to Visit  Medication Sig Dispense Refill   losartan (COZAAR) 50 MG tablet Take 1 tablet by mouth daily. 30 tablet 2   Omega-3 Fatty Acids (FISH OIL PO) Take 1,080 mg by mouth at bedtime.     omeprazole (PRILOSEC) 40 MG capsule Take 1 capsule (40 mg total) by mouth daily. 90 capsule 1   REPATHA SURECLICK 140 MG/ML SOAJ Inject 140 mg into the skin every 14 (fourteen) days. 6 mL 0   Turmeric 500 MG TABS Take 2 tablets by mouth daily.     No current facility-administered medications on file prior to visit.   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 Zada Girt [facility])   Mixed hyperlipidemia    Myalgia due to statin 09/12/2020   Peripheral neuropathy due to chemotherapy (HCC)    06-14-2020  per pt right arm to fingers   PONV (postoperative nausea and vomiting)    vomit with Gallbladder surgery   Port catheter in place 08/20/2015   Vitamin D deficiency    Wears glasses    Past Surgical History:  Procedure Laterality Date   BREAST BIOPSY Right 2019    per pt benign   COLON SURGERY  11/16/2014   @[facility] ---  partial transverse colectomy w/ node dissection   COLONOSCOPY  10/19/2014   Colonic mass (biopsied and tattooed) Small internal hemorrhoids.   DILATATION & CURETTAGE/HYSTEROSCOPY WITH MYOSURE N/A 06/16/2020   Procedure: DILATATION & CURETTAGE/HYSTEROSCOPY WITH MYOSURE EXCISION;  Surgeon: Genia Del, MD;  Location: [facility];  Service: Gynecology;  Laterality: N/A;   EUS N/A 02/17/2016   Procedure: UPPER ENDOSCOPIC ULTRASOUND (EUS) RADIAL;  Surgeon: Rachael Fee, MD;  Location: [facility];  Service: Endoscopy;  Laterality: N/A;   EUS N/A 07/20/2016   Procedure: UPPER ENDOSCOPIC ULTRASOUND (EUS) RADIAL;  Surgeon: Rachael Fee, MD;  Location: [facility];  Service: Endoscopy;  Laterality: N/A;   KNEE ARTHROSCOPY Right 2012   LAPAROSCOPIC CHOLECYSTECTOMY  2003   PORTA CATH INSERTION  12/22/2014   TONSILLECTOMY  1994    Family History  Problem Relation Age of Onset   Other Father        doesn't go to doctor or get colonoscopies   Other Brother        stomach issues; has had early colonoscopy   Other Maternal Aunt        won't go to doctor; probably has not had colonoscopy   Cervical cancer Paternal Aunt 58       treated at [facility]   Throat cancer Paternal Aunt 77       smoker   Skin cancer Maternal Grandmother        non-melanoma; dx. late 60s   Prostate cancer Maternal Grandfather        dx. late 74s   Breast cancer Paternal Grandmother        dx. 56s   Cancer Cousin        "some kind of stomach cancer" dx. early 46s   Stomach cancer Other        dx. "older"   Colon cancer Other        dx. 60s   Cancer Other        "female cancer" dx. "later age";    Breast cancer Other        dx. 98s-60s   Ulcerative colitis Neg Hx    Social History   Socioeconomic History   Marital status: Married    Spouse name: Not on file   Number of children: 1   Years of education: Not on file   Highest  education level: Not  on file  Occupational History   Occupation: Production designer, theatre/television/film    Comment: Airline pilot  Tobacco Use   Smoking status: Never   Smokeless tobacco: Never  Vaping Use   Vaping status: Never Used  Substance and Sexual Activity   Alcohol use: No   Drug use: Never   Sexual activity: Not Currently    Partners: Male    Birth control/protection: Abstinence  Other Topics Concern   Not on file  Social History Narrative   Not on file   Social Determinants of Health   Financial Resource Strain: Low Risk  (06/23/2022)   Overall Financial Resource Strain (CARDIA)    Difficulty of Paying Living Expenses: Not hard at all  Food Insecurity: No Food Insecurity (06/23/2022)   Hunger Vital Sign    Worried About Running Out of Food in the Last Year: Never true    Ran Out of Food in the Last Year: Never true  Transportation Needs: No Transportation Needs (06/23/2022)   PRAPARE - Administrator, Civil Service (Medical): No    Lack of Transportation (Non-Medical): No  Physical Activity: Insufficiently Active (06/23/2022)   Exercise Vital Sign    Days of Exercise per Week: 4 days    Minutes of Exercise per Session: 30 min  Stress: No Stress Concern Present (06/23/2022)   [employer] of Occupational Health - Occupational Stress Questionnaire    Feeling of Stress : Not at all  Social Connections: Socially Integrated (06/23/2022)   Social Connection and Isolation Panel [NHANES]    Frequency of Communication with Friends and Family: More than three times a week    Frequency of Social Gatherings with Friends and Family: Three times a week    Attends Religious Services: More than 4 times per year    Active Member of Clubs or Organizations: No    Attends Engineer, structural: More than 4 times per year    Marital Status: Married    Objective:  BP 110/80   Pulse 85   Temp (!) 97.5 F (36.4 C)   Resp 14   Ht 5\' 5"  (1.651 m)   Wt 216 lb (98 kg)   LMP  (LMP  Unknown)   SpO2 98%   BMI 35.94 kg/m      11/27/2022    8:16 AM 10/17/2022    8:19 AM 10/04/2022    2:55 PM  BP/Weight  Systolic BP 110 128 124  Diastolic BP 80 84 84  Wt. (Lbs) 216 231 227  BMI 35.94 kg/m2 38.44 kg/m2 38.96 kg/m2    Physical Exam Constitutional:      General: She is not in acute distress.    Appearance: Normal appearance. She is obese. She is not ill-appearing.  HENT:     Mouth/Throat:     Mouth: Mucous membranes are dry.  Eyes:     Conjunctiva/sclera: Conjunctivae normal.  Cardiovascular:     Rate and Rhythm: Normal rate and regular rhythm.     Heart sounds: Normal heart sounds.  Pulmonary:     Effort: Pulmonary effort is normal.     Breath sounds: Normal breath sounds.  Abdominal:     General: Bowel sounds are normal.     Palpations: Abdomen is soft.     Tenderness: There is no abdominal tenderness.  Neurological:     Mental Status: She is alert. Mental status is at baseline.  Psychiatric:        Mood and Affect: Mood normal.  Behavior: Behavior normal.     Lab Results  Component Value Date   WBC 6.2 10/17/2022   HGB 13.9 10/17/2022   HCT 44.6 10/17/2022   PLT 245 10/17/2022   GLUCOSE 103 (H) 10/17/2022   CHOL 136 10/17/2022   TRIG 105 10/17/2022   HDL 43 10/17/2022   LDLCALC 74 10/17/2022   ALT 22 10/17/2022   AST 20 10/17/2022   NA 144 10/17/2022   K 5.0 10/17/2022   CL 105 10/17/2022   CREATININE 1.15 (H) 10/17/2022   BUN 16 10/17/2022   CO2 23 10/17/2022   TSH 4.270 12/17/2020   INR 0.94 12/22/2014      Assessment & Plan:    Class 2 severe obesity due to excess calories with serious comorbidity and body mass index (BMI) of 38.0 to 38.9 in adult [facility]) Assessment & Plan: Patient has lost 15 pounds on phentermine - Continue phentermine 37.5 mg by mouth once daily in the morning - Continue to work on diet and exercise -FU with Dr. Sedalia Muta as scheduled.  Orders: -     Phentermine HCl; Take 1 tablet (37.5 mg total) by  mouth daily before breakfast.  Dispense: 30 tablet; Refill: 1     Meds ordered this encounter  Medications   phentermine (ADIPEX-P) 37.5 MG tablet    Sig: Take 1 tablet (37.5 mg total) by mouth daily before breakfast.    Dispense:  30 tablet    Refill:  1    No orders of the defined types were placed in this encounter.    Follow-up: Return in about 2 months (around 01/27/2023) for keep appt with Dr. Sedalia Muta in Jan.   I,Marla I Leal-Borjas,acting as a scribe for Renne Crigler, FNP.,have documented all relevant documentation on the behalf of Renne Crigler, FNP,as directed by  Renne Crigler, FNP while in the presence of Renne Crigler, FNP.   An After Visit Summary was printed and given to the patient.  Total time spent on today's visit was greater than 30 minutes, including both face-to-face time and nonface-to-face time personally spent on review of chart (labs and imaging), discussing labs and goals, discussing further work-up, treatment options, referrals to specialist if needed, reviewing outside records if pertinent, answering patient's questions, and coordinating care.   Lajuana Matte, FNP [facility] [phone]

## 2022-12-05 ENCOUNTER — Other Ambulatory Visit: Payer: Self-pay | Admitting: Family Medicine

## 2022-12-05 DIAGNOSIS — E66812 Obesity, class 2: Secondary | ICD-10-CM

## 2022-12-19 ENCOUNTER — Other Ambulatory Visit: Payer: Self-pay

## 2022-12-19 ENCOUNTER — Other Ambulatory Visit: Payer: Self-pay | Admitting: Family Medicine

## 2022-12-22 ENCOUNTER — Other Ambulatory Visit: Payer: Self-pay | Admitting: Family Medicine

## 2023-01-09 ENCOUNTER — Encounter: Payer: Self-pay | Admitting: Oncology

## 2023-01-20 ENCOUNTER — Encounter: Payer: Self-pay | Admitting: Family Medicine

## 2023-01-25 ENCOUNTER — Telehealth: Payer: Self-pay | Admitting: Family Medicine

## 2023-01-25 NOTE — Telephone Encounter (Unsigned)
Copied from CRM [id number]. Topic: Clinical - Prescription Issue >> Jan 25, 2023  4:57 PM Clayton Bibles wrote: Reason for CRM: Rosann Auerbach is needing additional information for prior authorization for REPATHA SURECLICK 140 MG/ML SOAJ. Please call [employer] Prior Authorization Department. At [phone] The caller today does not have what [employer] Prior Authorization Department needs

## 2023-01-28 NOTE — Progress Notes (Unsigned)
Subjective:  Patient ID: Brenda Becker, female    DOB: 14-Nov-1970  Age: 53 y.o. MRN: [id number]  Chief Complaint  Patient presents with   Medical Management of Chronic Issues    HPI Hypertensive with ckd 3A: Taking Losartan 50 mg daily.  Hyperlipidemia: Takes Repatha 140 mg every 14 days, Fish Oil daily.  GERD: Currently taking Omeprazole 40 mg daily.    Colon cancer: stable. Monitored by oncology. Chemo induced neuropathy.   Obesity: Eats healthy. Walks a lot at work.  Phentermine 37.5 mg once daily in am.      01/29/2023    9:48 AM 10/17/2022    8:22 AM 06/23/2022    8:44 AM 02/21/2022    9:08 AM 07/11/2021    7:47 AM  Depression screen PHQ 2/9  Decreased Interest 0 0 2 0 0  Down, Depressed, Hopeless 0 0 0 0 0  PHQ - 2 Score 0 0 2 0 0  Altered sleeping  0 3    Tired, decreased energy  0 3    Change in appetite  0 1    Feeling bad or failure about yourself   0 0    Trouble concentrating  0 0    Moving slowly or fidgety/restless  0 0    Suicidal thoughts  0 0    PHQ-9 Score  0 9    Difficult doing work/chores  Not difficult at all Not difficult at all          10/04/2022    3:02 PM  Fall Risk   Falls in the past year? 0  Number falls in past yr: 0  Injury with Fall? 0  Risk for fall due to : No Fall Risks  Follow up Falls evaluation completed    Patient Care Team: Blane Ohara, MD as PCP - General (Family Medicine) Genia Del, MD as Consulting Physician (Obstetrics and Gynecology)   Review of Systems  Constitutional:  Negative for chills, fatigue and fever.  HENT:  Negative for congestion, ear pain, rhinorrhea and sore throat.   Respiratory:  Negative for cough and shortness of breath.   Cardiovascular:  Negative for chest pain.  Gastrointestinal:  Negative for abdominal pain, constipation, diarrhea, nausea and vomiting.  Genitourinary:  Negative for dysuria and urgency.  Musculoskeletal:  Negative for back pain and myalgias.  Neurological:   Negative for dizziness, weakness, light-headedness and headaches.  Psychiatric/Behavioral:  Negative for dysphoric mood. The patient is not nervous/anxious.     Current Outpatient Medications on File Prior to Visit  Medication Sig Dispense Refill   losartan (COZAAR) 50 MG tablet Take 1 tablet by mouth daily. 30 tablet 2   Omega-3 Fatty Acids (FISH OIL PO) Take 1,080 mg by mouth at bedtime.     omeprazole (PRILOSEC) 40 MG capsule Take 1 capsule (40 mg total) by mouth daily. 90 capsule 1   phentermine (ADIPEX-P) 37.5 MG tablet Take 1 tablet (37.5 mg total) by mouth daily before breakfast. 30 tablet 1   REPATHA SURECLICK 140 MG/ML SOAJ Inject 140 mg into the skin every 14 (fourteen) days. 6 mL 0   Turmeric 500 MG TABS Take 2 tablets by mouth daily.     No current facility-administered medications on file prior to visit.   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 Zada Girt [facility])   Mixed hyperlipidemia    Myalgia due to statin 09/12/2020   Peripheral neuropathy due to chemotherapy (HCC)    06-14-2020  per pt right arm to fingers   PONV (postoperative nausea and vomiting)    vomit with Gallbladder surgery   Port catheter in place 08/20/2015   Vitamin D deficiency    Wears glasses    Past Surgical History:  Procedure Laterality  Date   BREAST BIOPSY Right 2019   per pt benign   COLON SURGERY  11/16/2014   @[facility] ---  partial transverse colectomy w/ node dissection   COLONOSCOPY  10/19/2014   Colonic mass (biopsied and tattooed) Small internal hemorrhoids.   DILATATION & CURETTAGE/HYSTEROSCOPY WITH MYOSURE N/A 06/16/2020   Procedure: DILATATION & CURETTAGE/HYSTEROSCOPY WITH MYOSURE EXCISION;  Surgeon: Genia Del, MD;  Location: [facility];  Service: Gynecology;  Laterality: N/A;   EUS N/A 02/17/2016   Procedure: UPPER ENDOSCOPIC ULTRASOUND (EUS) RADIAL;  Surgeon: Rachael Fee, MD;  Location: [facility];  Service: Endoscopy;  Laterality: N/A;   EUS N/A 07/20/2016   Procedure: UPPER ENDOSCOPIC ULTRASOUND (EUS) RADIAL;  Surgeon: Rachael Fee, MD;  Location: [facility];  Service: Endoscopy;  Laterality: N/A;   KNEE ARTHROSCOPY Right 2012   LAPAROSCOPIC CHOLECYSTECTOMY  2003   PORTA CATH INSERTION  12/22/2014   TONSILLECTOMY  1994    Family History  Problem Relation Age of Onset   Other Father        doesn't go to doctor or get colonoscopies   Other Brother        stomach issues; has had early colonoscopy   Other Maternal Aunt        won't go to doctor; probably has not had colonoscopy   Cervical cancer Paternal Aunt 59       treated at [facility]   Throat cancer Paternal Aunt 72       smoker   Skin cancer Maternal Grandmother        non-melanoma; dx. late 60s   Prostate cancer Maternal Grandfather        dx. late 73s   Breast cancer Paternal Grandmother        dx. 105s   Cancer Cousin        "some kind of stomach cancer" dx. early 71s   Stomach cancer Other        dx. "older"   Colon cancer Other        dx. 60s   Cancer Other        "female cancer" dx. "later age";    Breast cancer Other        dx. 28s-60s   Ulcerative colitis Neg Hx    Social History   Socioeconomic History   Marital status: Married    Spouse name: Not on file   Number of children: 1   Years of  education: Not on file   Highest education level: Not on file  Occupational History   Occupation: Production designer, theatre/television/film    Comment: Airline pilot  Tobacco Use   Smoking status: Never   Smokeless tobacco: Never  Vaping Use   Vaping status: Never Used  Substance and Sexual Activity   Alcohol use: No   Drug use: Never   Sexual activity: Not Currently    Partners: Male    Birth control/protection: Abstinence  Other Topics Concern   Not on file  Social History Narrative   Not on file   Social Drivers of Health   Financial Resource Strain: Low Risk  (06/23/2022)   Overall Financial Resource Strain (CARDIA)    Difficulty of Paying Living Expenses: Not hard at all  Food Insecurity: No Food Insecurity (06/23/2022)   Hunger Vital Sign    Worried About Running Out of Food in the Last Year: Never true    Ran Out of Food in the Last Year: Never true  Transportation Needs: No Transportation Needs (06/23/2022)   PRAPARE - Administrator, Civil Service (Medical): No    Lack of Transportation (Non-Medical): No  Physical Activity: Insufficiently Active (06/23/2022)   Exercise Vital Sign    Days of Exercise per Week: 4 days    Minutes of Exercise per Session: 30 min  Stress: No Stress Concern Present (06/23/2022)   [employer] of Occupational Health - Occupational Stress Questionnaire    Feeling of Stress : Not at all  Social Connections: Socially Integrated (06/23/2022)   Social Connection and Isolation Panel [NHANES]    Frequency of Communication with Friends and Family: More than three times a week    Frequency of Social Gatherings with Friends and Family: Three times a week    Attends Religious Services: More than 4 times per year    Active Member of Clubs or Organizations: No    Attends Engineer, structural: More than 4 times per year    Marital Status: Married    Objective:  BP 136/84   Pulse 80   Temp 97.6 F (36.4 C)   Ht 5\' 5"  (1.651 m)   Wt 215 lb (97.5 kg)    SpO2 98%   BMI 35.78 kg/m      01/29/2023    9:47 AM 11/27/2022    8:16 AM 10/17/2022    8:19 AM  BP/Weight  Systolic BP 136 110 128  Diastolic BP 84 80 84  Wt. (Lbs) 215 216 231  BMI 35.78 kg/m2 35.94 kg/m2 38.44 kg/m2    Physical Exam Vitals reviewed.  Constitutional:      Appearance: Normal appearance. She is normal weight.  Neck:     Vascular: No carotid bruit.  Cardiovascular:     Rate and Rhythm: Normal rate and regular rhythm.     Heart sounds: Normal heart sounds.  Pulmonary:     Effort: Pulmonary effort is normal. No respiratory distress.     Breath sounds: Normal breath sounds.  Abdominal:     General: Abdomen is flat. Bowel sounds are normal.     Palpations: Abdomen is soft.     Tenderness: There is no abdominal tenderness.  Neurological:     Mental Status: She is alert and oriented to person, place, and time.  Psychiatric:        Mood and Affect: Mood normal.        Behavior: Behavior normal.     Diabetic Foot Exam - Simple   No data filed      Lab Results  Component Value  Date   WBC 6.2 10/17/2022   HGB 13.9 10/17/2022   HCT 44.6 10/17/2022   PLT 245 10/17/2022   GLUCOSE 103 (H) 10/17/2022   CHOL 136 10/17/2022   TRIG 105 10/17/2022   HDL 43 10/17/2022   LDLCALC 74 10/17/2022   ALT 22 10/17/2022   AST 20 10/17/2022   NA 144 10/17/2022   K 5.0 10/17/2022   CL 105 10/17/2022   CREATININE 1.15 (H) 10/17/2022   BUN 16 10/17/2022   CO2 23 10/17/2022   TSH 4.270 12/17/2020   INR 0.94 12/22/2014      Assessment & Plan:    GERD without esophagitis  Chronic kidney disease, stage 3a (HCC)  Hypertensive renal disease -     CBC with Differential/Platelet -     Comprehensive metabolic panel  Mixed hyperlipidemia -     Lipid panel  Malignant neoplasm of transverse colon (HCC)     No orders of the defined types were placed in this encounter.   Orders Placed This Encounter  Procedures   CBC with Differential/Platelet    Comprehensive metabolic panel   Lipid panel     Follow-up: Return in about 6 months (around 07/29/2023) for chronic follow up.   I,Marla I Leal-Borjas,acting as a scribe for Blane Ohara, MD.,have documented all relevant documentation on the behalf of Blane Ohara, MD,as directed by  Blane Ohara, MD while in the presence of Blane Ohara, MD.   An After Visit Summary was printed and given to the patient.  Blane Ohara, MD [facility] [phone]

## 2023-01-29 ENCOUNTER — Ambulatory Visit: Payer: 59 | Admitting: Family Medicine

## 2023-01-29 ENCOUNTER — Encounter: Payer: Self-pay | Admitting: Oncology

## 2023-01-29 ENCOUNTER — Encounter: Payer: Self-pay | Admitting: Family Medicine

## 2023-01-29 VITALS — BP 136/84 | HR 80 | Temp 97.6°F | Ht 65.0 in | Wt 215.0 lb

## 2023-01-29 DIAGNOSIS — C184 Malignant neoplasm of transverse colon: Secondary | ICD-10-CM

## 2023-01-29 DIAGNOSIS — E66812 Obesity, class 2: Secondary | ICD-10-CM

## 2023-01-29 DIAGNOSIS — I129 Hypertensive chronic kidney disease with stage 1 through stage 4 chronic kidney disease, or unspecified chronic kidney disease: Secondary | ICD-10-CM | POA: Diagnosis not present

## 2023-01-29 DIAGNOSIS — K219 Gastro-esophageal reflux disease without esophagitis: Secondary | ICD-10-CM

## 2023-01-29 DIAGNOSIS — C772 Secondary and unspecified malignant neoplasm of intra-abdominal lymph nodes: Secondary | ICD-10-CM

## 2023-01-29 DIAGNOSIS — E782 Mixed hyperlipidemia: Secondary | ICD-10-CM | POA: Diagnosis not present

## 2023-01-29 DIAGNOSIS — C189 Malignant neoplasm of colon, unspecified: Secondary | ICD-10-CM

## 2023-01-29 DIAGNOSIS — Z6835 Body mass index (BMI) 35.0-35.9, adult: Secondary | ICD-10-CM

## 2023-01-29 DIAGNOSIS — N1831 Chronic kidney disease, stage 3a: Secondary | ICD-10-CM

## 2023-01-29 DIAGNOSIS — G620 Drug-induced polyneuropathy: Secondary | ICD-10-CM

## 2023-01-29 DIAGNOSIS — I700 Atherosclerosis of aorta: Secondary | ICD-10-CM

## 2023-01-30 ENCOUNTER — Encounter: Payer: Self-pay | Admitting: Family Medicine

## 2023-01-30 LAB — CBC WITH DIFFERENTIAL/PLATELET
Basophils Absolute: 0.1 10*3/uL (ref 0.0–0.2)
Basos: 1 %
EOS (ABSOLUTE): 0.2 10*3/uL (ref 0.0–0.4)
Eos: 4 %
Hematocrit: 43 % (ref 34.0–46.6)
Hemoglobin: 14.1 g/dL (ref 11.1–15.9)
Immature Grans (Abs): 0 10*3/uL (ref 0.0–0.1)
Immature Granulocytes: 0 %
Lymphocytes Absolute: 2.3 10*3/uL (ref 0.7–3.1)
Lymphs: 42 %
MCH: 27.6 pg (ref 26.6–33.0)
MCHC: 32.8 g/dL (ref 31.5–35.7)
MCV: 84 fL (ref 79–97)
Monocytes Absolute: 0.4 10*3/uL (ref 0.1–0.9)
Monocytes: 8 %
Neutrophils Absolute: 2.5 10*3/uL (ref 1.4–7.0)
Neutrophils: 45 %
Platelets: 229 10*3/uL (ref 150–450)
RBC: 5.1 x10E6/uL (ref 3.77–5.28)
RDW: 13.8 % (ref 11.7–15.4)
WBC: 5.6 10*3/uL (ref 3.4–10.8)

## 2023-01-30 LAB — COMPREHENSIVE METABOLIC PANEL
ALT: 23 [IU]/L (ref 0–32)
AST: 20 [IU]/L (ref 0–40)
Albumin: 4.5 g/dL (ref 3.8–4.9)
Alkaline Phosphatase: 92 [IU]/L (ref 44–121)
BUN/Creatinine Ratio: 9 (ref 9–23)
BUN: 11 mg/dL (ref 6–24)
Bilirubin Total: 0.4 mg/dL (ref 0.0–1.2)
CO2: 21 mmol/L (ref 20–29)
Calcium: 10.1 mg/dL (ref 8.7–10.2)
Chloride: 104 mmol/L (ref 96–106)
Creatinine, Ser: 1.19 mg/dL — ABNORMAL HIGH (ref 0.57–1.00)
Globulin, Total: 2.8 g/dL (ref 1.5–4.5)
Glucose: 95 mg/dL (ref 70–99)
Potassium: 5 mmol/L (ref 3.5–5.2)
Sodium: 143 mmol/L (ref 134–144)
Total Protein: 7.3 g/dL (ref 6.0–8.5)
eGFR: 55 mL/min/{1.73_m2} — ABNORMAL LOW (ref >59)

## 2023-01-30 LAB — LIPID PANEL
Chol/HDL Ratio: 3.3 {ratio} (ref 0.0–4.4)
Cholesterol, Total: 151 mg/dL (ref 100–199)
HDL: 46 mg/dL (ref >39)
LDL Chol Calc (NIH): 88 mg/dL (ref 0–99)
Triglycerides: 92 mg/dL (ref 0–149)
VLDL Cholesterol Cal: 17 mg/dL (ref 5–40)

## 2023-01-31 NOTE — Assessment & Plan Note (Signed)
The current medical regimen is effective;  continue present plan and medications. Taking Repatha 140 mg every 14 days and Fish Oil one daily. Recommend continue to work on eating healthy diet and exercise.

## 2023-01-31 NOTE — Assessment & Plan Note (Signed)
Stable. No nsaids.  ?

## 2023-01-31 NOTE — Assessment & Plan Note (Signed)
The current medical regimen is effective;  continue present plan and medications. Continue omeprazole 40 mg daily.

## 2023-01-31 NOTE — Assessment & Plan Note (Signed)
Well controlled.  ?No changes to medicines. Continue losartan 50 mg daily.  ?Continue to work on eating a healthy diet and exercise.  ?Labs drawn today.  ? ?

## 2023-01-31 NOTE — Assessment & Plan Note (Signed)
Check labs stable

## 2023-01-31 NOTE — Assessment & Plan Note (Signed)
Well controlled.  No changes to medicines. -Resume Repatha as soon as available. Fish Oil daily. Continue to work on eating a healthy diet and exercise.  Labs drawn today.

## 2023-01-31 NOTE — Assessment & Plan Note (Signed)
On Phentermine for weight loss. Lost 15 pounds since October. Some inconsistent use on weekends. -Encourage consistent use of Phentermine for appetite suppression. -Continue current regimen.

## 2023-01-31 NOTE — Assessment & Plan Note (Signed)
Management per specialist.

## 2023-01-31 NOTE — Assessment & Plan Note (Signed)
Stable. Management per specialist

## 2023-01-31 NOTE — Assessment & Plan Note (Signed)
No medications at this time. Secondary to chemotherapy.

## 2023-02-03 ENCOUNTER — Other Ambulatory Visit: Payer: Self-pay | Admitting: Family Medicine

## 2023-02-03 DIAGNOSIS — E66812 Obesity, class 2: Secondary | ICD-10-CM

## 2023-02-14 ENCOUNTER — Telehealth: Payer: Self-pay

## 2023-02-14 NOTE — Telephone Encounter (Signed)
Patient has been approved for  repatha sureclick 140mg /ml till 01/27/2024.

## 2023-03-19 ENCOUNTER — Other Ambulatory Visit: Payer: Self-pay | Admitting: Family Medicine

## 2023-03-27 ENCOUNTER — Other Ambulatory Visit: Payer: Self-pay | Admitting: Family Medicine

## 2023-05-19 ENCOUNTER — Other Ambulatory Visit: Payer: Self-pay | Admitting: Family Medicine

## 2023-06-07 DIAGNOSIS — C641 Malignant neoplasm of right kidney, except renal pelvis: Secondary | ICD-10-CM | POA: Insufficient documentation

## 2023-06-07 DIAGNOSIS — N2889 Other specified disorders of kidney and ureter: Secondary | ICD-10-CM | POA: Insufficient documentation

## 2023-07-04 ENCOUNTER — Other Ambulatory Visit: Payer: Self-pay

## 2023-07-04 MED ORDER — LOSARTAN POTASSIUM 50 MG PO TABS: 50.0000 mg | ORAL_TABLET | Freq: Every day | ORAL | 1 refills | Status: DC

## 2023-07-04 NOTE — Telephone Encounter (Signed)
 Refill sent to pharmacy.

## 2023-07-16 ENCOUNTER — Other Ambulatory Visit: Payer: Self-pay | Admitting: Family Medicine

## 2023-07-24 ENCOUNTER — Other Ambulatory Visit: Payer: Self-pay | Admitting: Family Medicine

## 2023-07-30 ENCOUNTER — Ambulatory Visit: Payer: Managed Care, Other (non HMO) | Admitting: Family Medicine

## 2023-07-30 ENCOUNTER — Encounter: Payer: Self-pay | Admitting: Family Medicine

## 2023-07-30 VITALS — BP 132/78 | HR 107 | Temp 98.3°F | Ht 65.0 in | Wt 213.0 lb

## 2023-07-30 DIAGNOSIS — L650 Telogen effluvium: Secondary | ICD-10-CM | POA: Insufficient documentation

## 2023-07-30 DIAGNOSIS — R682 Dry mouth, unspecified: Secondary | ICD-10-CM | POA: Insufficient documentation

## 2023-07-30 DIAGNOSIS — C641 Malignant neoplasm of right kidney, except renal pelvis: Secondary | ICD-10-CM

## 2023-07-30 DIAGNOSIS — I129 Hypertensive chronic kidney disease with stage 1 through stage 4 chronic kidney disease, or unspecified chronic kidney disease: Secondary | ICD-10-CM | POA: Diagnosis not present

## 2023-07-30 DIAGNOSIS — C772 Secondary and unspecified malignant neoplasm of intra-abdominal lymph nodes: Secondary | ICD-10-CM

## 2023-07-30 DIAGNOSIS — C189 Malignant neoplasm of colon, unspecified: Secondary | ICD-10-CM

## 2023-07-30 DIAGNOSIS — E782 Mixed hyperlipidemia: Secondary | ICD-10-CM | POA: Diagnosis not present

## 2023-07-30 DIAGNOSIS — N2889 Other specified disorders of kidney and ureter: Secondary | ICD-10-CM

## 2023-07-30 DIAGNOSIS — C184 Malignant neoplasm of transverse colon: Secondary | ICD-10-CM

## 2023-07-30 MED ORDER — LOSARTAN POTASSIUM 50 MG PO TABS: 50.0000 mg | ORAL_TABLET | Freq: Every day | ORAL | 3 refills | Status: AC

## 2023-07-30 NOTE — Assessment & Plan Note (Signed)
 Managed with Repatha

## 2023-07-30 NOTE — Patient Instructions (Signed)
 VISIT SUMMARY:  During your visit, we discussed your concerns about a renal lesion, hair loss, dry mouth, sleep disturbances, and other symptoms. We reviewed your current conditions and made plans for follow-up and further evaluation.  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 -Follow up with your oncologist in November for a repeat scan. -Consider seeking a second opinion from another specialist. -Send a MyChart message to Dr. Orvis to discuss your concerns and the potential for a second opinion.  COLON CANCER: Your colon cancer is currently stable and well-managed, with no metastasis to the kidney. -Continue with your current management plan.  HYPERTENSION: Your blood pressure is stable with your current medication. -Continue taking losartan  50 mg once daily. -A year's prescription for losartan  will be sent to [facility].  HYPERLIPIDEMIA: Your cholesterol levels are being managed with Repatha . -Continue with your current management plan.  HAIR LOSS: You have significant hair thinning with visible bald spots, which could be due to stress, menopause, or thyroid issues. -We will order blood work to check your thyroid function. -Consider prescribing finasteride  for hair loss after reviewing blood work results.  DRY MOUTH: You have persistent dry mouth that is not related to your current medications. -We will evaluate your blood work results for potential causes.

## 2023-07-30 NOTE — Assessment & Plan Note (Signed)
 Persistent xerostomia, not attributed to current medications. - Evaluate blood work results for potential causes.

## 2023-07-30 NOTE — Assessment & Plan Note (Signed)
 Stable on losartan  50 mg daily. - Continue losartan  50 mg once daily. - Send a year's prescription to [facility].

## 2023-07-30 NOTE — Assessment & Plan Note (Signed)
 Colon cancer well-managed, no metastasis to kidney. Renal lesion considered separate.

## 2023-07-30 NOTE — Assessment & Plan Note (Signed)
 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 Oncologist recommends surveillance; no malignancy suspected. - Follow up with oncologist in November for repeat scan. - Consider seeking a second opinion from another specialist. - Send a MyChart message to Dr. Orvis to discuss concerns and potential for a second opinion.

## 2023-07-30 NOTE — Progress Notes (Unsigned)
 Subjective:  Patient ID: Brenda Becker, female    DOB: 13-May-1970  Age: 53 y.o. MRN: [id number]  Chief Complaint  Patient presents with   Medical Management of Chronic Issues    HPI: Hypertensive with ckd 3A: Taking Losartan  50 mg daily.   Hyperlipidemia: Takes Repatha  140 mg every 14 days, Fish Oil daily.   GERD: Currently taking Omeprazole  40 mg daily.     Colon cancer: stable. Monitored by oncology. Chemo induced neuropathy.    Obesity: Eats healthy. Walks a lot at work.  Phentermine  37.5 mg once daily in am.   States her hair is thinning. Wakes up multiple times through out the night.  Discussed the use of AI scribe software for clinical note transcription with the patient, who gave verbal consent to proceed.  History of Present Illness  Brenda Becker is a 53 year old female who presents with concerns about a renal lesion and hair loss.  Renal mass - Renal lesion discovered during routine CT scans for colon cancer surveillance - Lesion present for several years with interval increase in size - Biopsy confirmed low-grade renal oncocytic neoplasm - Concern regarding decision not to remove the lesion, particularly due to growth  Colorectal malignancy and chemotherapy sequelae - Colon cancer currently stable - Neuropathy in hands secondary to prior chemotherapy, not significantly bothersome  Alopecia - Significant hair thinning over the past 2-3 months - Hair loss described as originating from the root with visible bald spots  Xerostomia - Severe dry mouth persists despite discontinuation of phentermine  two months ago  Sleep disturbance and nocturia - Wakes nightly between 2 and 4 AM, often feeling wide awake - Nocturia present, requiring bathroom use during the night - Difficulty returning to sleep after awakening  Vasomotor symptoms - Feels hot and sweaty at night, requiring adjustment of bedding to cool down - History of using a cooling blanket to manage  nocturnal symptoms  Lifestyle and diet - Maintains a healthy diet - Attempts regular exercise, though recent weather has limited activity - Consumes blueberry smoothies and drinks plenty of water daily  Associated symptoms - No fevers, chills, or earaches          01/29/2023    9:48 AM 10/17/2022    8:22 AM 06/23/2022    8:44 AM 02/21/2022    9:08 AM 07/11/2021    7:47 AM  Depression screen PHQ 2/9  Decreased Interest 0 0 2 0 0  Down, Depressed, Hopeless 0 0 0 0 0  PHQ - 2 Score 0 0 2 0 0  Altered sleeping  0 3    Tired, decreased energy  0 3    Change in appetite  0 1    Feeling bad or failure about yourself   0 0    Trouble concentrating  0 0    Moving slowly or fidgety/restless  0 0    Suicidal thoughts  0 0    PHQ-9 Score  0 9    Difficult doing work/chores  Not difficult at all Not difficult at all          10/04/2022    3:02 PM  Fall Risk   Falls in the past year? 0  Number falls in past yr: 0  Injury with Fall? 0  Risk for fall due to : No Fall Risks  Follow up Falls evaluation completed    Patient Care Team: Sherre Clapper, MD as PCP - General (Family Medicine) Lavoie, Marie-Lyne, MD as Consulting  Physician (Obstetrics and Gynecology)   Review of Systems  Constitutional:  Positive for diaphoresis (night sweats) and fatigue. Negative for chills and fever.  HENT:  Negative for congestion, ear pain, rhinorrhea and sore throat.   Respiratory:  Negative for cough and shortness of breath.   Cardiovascular:  Negative for chest pain.  Gastrointestinal:  Negative for abdominal pain, constipation, diarrhea, nausea and vomiting.  Genitourinary:  Negative for dysuria and urgency.  Musculoskeletal:  Negative for back pain and myalgias.  Neurological:  Negative for dizziness, weakness, light-headedness and headaches.  Psychiatric/Behavioral:  Negative for dysphoric mood. The patient is not nervous/anxious.     Current Outpatient Medications on File Prior to Visit   Medication Sig Dispense Refill   Omega-3 Fatty Acids (FISH OIL PO) Take 1,080 mg by mouth at bedtime.     omeprazole  (PRILOSEC) 40 MG capsule Take 1 capsule (40 mg total) by mouth daily. 90 capsule 1   REPATHA  SURECLICK 140 MG/ML SOAJ INJECT 140 MG SUBCUTANEOUSLY EVERY 14 DAYS 4 mL 0   Turmeric 500 MG TABS Take 2 tablets by mouth daily.     No current facility-administered medications on file prior to visit.   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 gayle [facility]   Mixed hyperlipidemia    Myalgia due to statin 09/12/2020   Peripheral neuropathy due to chemotherapy (HCC)    06-14-2020  per pt right arm to fingers   PONV (postoperative nausea and vomiting)    vomit with Gallbladder surgery   Port catheter in place 08/20/2015   Vitamin D deficiency    Wears glasses    Past Surgical History:  Procedure Laterality Date   BREAST BIOPSY Right 2019   per pt benign   COLON SURGERY  11/16/2014   @[facility] ---  partial transverse colectomy w/ node dissection   COLONOSCOPY  10/19/2014   Colonic mass (biopsied and tattooed) Small internal hemorrhoids.   DILATATION & CURETTAGE/HYSTEROSCOPY WITH MYOSURE N/A 06/16/2020   Procedure: DILATATION & CURETTAGE/HYSTEROSCOPY WITH MYOSURE EXCISION;  Surgeon: Lavoie, Marie-Lyne, MD;  Location:  [facility] [place];  Service: Gynecology;  Laterality: N/A;   EUS N/A 02/17/2016   Procedure: UPPER ENDOSCOPIC ULTRASOUND (EUS) RADIAL;  Surgeon: Toribio SHAUNNA Cedar, MD;  Location: [facility];  Service: Endoscopy;  Laterality: N/A;   EUS N/A 07/20/2016   Procedure: UPPER ENDOSCOPIC ULTRASOUND (EUS) RADIAL;  Surgeon: Cedar Toribio SHAUNNA, MD;  Location: [facility];  Service: Endoscopy;  Laterality: N/A;   KNEE ARTHROSCOPY Right 2012   LAPAROSCOPIC CHOLECYSTECTOMY  2003   PORTA CATH INSERTION  12/22/2014   TONSILLECTOMY  1994    Family History  Problem Relation Age of Onset   Other Father        doesn't go to doctor or get colonoscopies   Other  Brother        stomach issues; has had early colonoscopy   Other Maternal Aunt        won't go to doctor; probably has not had colonoscopy   Cervical cancer Paternal Aunt 76       treated at [facility]   Throat cancer Paternal Aunt 72       smoker   Skin cancer Maternal Grandmother        non-melanoma; dx. late 60s   Prostate cancer Maternal Grandfather        dx. late 90s   Breast cancer Paternal Grandmother        dx. 41s   Cancer Cousin        some kind of stomach cancer dx. early 22s   Stomach cancer Other        dx. older   Colon cancer Other        dx. 78s   Cancer Other        female cancer dx. later age;    Breast cancer Other        dx. 94s-60s   Ulcerative colitis Neg Hx    Social History   Socioeconomic History   Marital status: Married    Spouse name: Not on file   Number of children: 1   Years of education: Not on file   Highest education level: Not on file  Occupational History   Occupation: Production designer, theatre/television/film    Comment: Airline pilot  Tobacco Use   Smoking status: Never   Smokeless tobacco: Never  Vaping Use   Vaping status: Never Used  Substance and Sexual Activity   Alcohol use: No   Drug use: Never   Sexual activity: Not Currently    Partners: Male    Birth control/protection: Abstinence  Other  Topics Concern   Not on file  Social History Narrative   Not on file   Social Drivers of Health   Financial Resource Strain: Low Risk  (06/23/2022)   Overall Financial Resource Strain (CARDIA)    Difficulty of Paying Living Expenses: Not hard at all  Food Insecurity: No Food Insecurity (07/30/2023)   Hunger Vital Sign    Worried About Running Out of Food in the Last Year: Never true    Ran Out of Food in the Last Year: Never true  Transportation Needs: No Transportation Needs (07/30/2023)   PRAPARE - Administrator, Civil Service (Medical): No    Lack of Transportation (Non-Medical): No  Physical Activity: Insufficiently Active (06/23/2022)   Exercise Vital Sign    Days of Exercise per Week: 4 days    Minutes of Exercise per Session: 30 min  Stress: No Stress Concern Present (06/23/2022)   [employer] of Occupational Health - Occupational Stress Questionnaire    Feeling of Stress : Not at all  Social Connections: Socially Integrated (06/23/2022)   Social Connection and Isolation Panel    Frequency of Communication with Friends and Family: More than three times a week    Frequency of Social Gatherings with Friends and Family: Three times a week    Attends Religious Services: More than 4 times per year    Active Member of Clubs or Organizations: No    Attends Engineer, structural: More than 4 times per year    Marital Status: Married    Objective:  BP 132/78   Pulse (!) 107   Temp 98.3 F (36.8 C)   Ht 5' 5 (1.651 m)  Wt 213 lb (96.6 kg)   SpO2 97%   BMI 35.45 kg/m      07/30/2023    8:55 AM 01/29/2023    9:47 AM 11/27/2022    8:16 AM  BP/Weight  Systolic BP 132 136 110  Diastolic BP 78 84 80  Wt. (Lbs) 213 215 216  BMI 35.45 kg/m2 35.78 kg/m2 35.94 kg/m2    Physical Exam Vitals reviewed.  Constitutional:      Appearance: Normal appearance. She is obese.  HENT:     Mouth/Throat:     Mouth: Mucous membranes are moist.  Neck:      Vascular: No carotid bruit.  Cardiovascular:     Rate and Rhythm: Normal rate and regular rhythm.     Heart sounds: Normal heart sounds.  Pulmonary:     Effort: Pulmonary effort is normal. No respiratory distress.     Breath sounds: Normal breath sounds.  Abdominal:     General: Abdomen is flat. Bowel sounds are normal.     Palpations: Abdomen is soft.     Tenderness: There is no abdominal tenderness.  Neurological:     Mental Status: She is alert and oriented to person, place, and time.  Psychiatric:        Mood and Affect: Mood normal.        Behavior: Behavior normal.     {Perform Simple Foot Exam  Perform Detailed exam:1} {Insert foot Exam (Optional):30965}   Lab Results  Component Value Date   WBC 5.6 01/29/2023   HGB 14.1 01/29/2023   HCT 43.0 01/29/2023   PLT 229 01/29/2023   GLUCOSE 95 01/29/2023   CHOL 151 01/29/2023   TRIG 92 01/29/2023   HDL 46 01/29/2023   LDLCALC 88 01/29/2023   ALT 23 01/29/2023   AST 20 01/29/2023   NA 143 01/29/2023   K 5.0 01/29/2023   CL 104 01/29/2023   CREATININE 1.19 (H) 01/29/2023   BUN 11 01/29/2023   CO2 21 01/29/2023   TSH 4.270 12/17/2020   INR 0.94 12/22/2014      Assessment & Plan:  Hypertensive renal disease Assessment & Plan: Stable on losartan  50 mg daily. - Continue losartan  50 mg once daily. - Send a year's prescription to [facility].  Orders: -     Comprehensive metabolic panel with GFR -     CBC with Differential/Platelet -     Losartan  Potassium; Take 1 tablet (50 mg total) by mouth daily.  Dispense: 90 tablet; Refill: 3  Mixed hyperlipidemia Assessment & Plan: Managed with Repatha .  Orders: -     Lipid panel  Telogen effluvium Assessment & Plan: Significant hair thinning with bald spots. Possible causes: stress, menopause, thyroid dysfunction. - Order blood work to check thyroid function. - Consider prescribing finasteride  for hair loss after reviewing blood work results.  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 Oncologist recommends surveillance; no malignancy suspected. - Follow up with oncologist in November for repeat scan. - Consider seeking a second opinion from another specialist. - Send a MyChart message to Dr. Orvis to discuss concerns and potential for a second opinion.   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 Renal lesion considered separate.   Dry mouth Assessment & Plan: Persistent  xerostomia, not attributed to current medications. - Evaluate blood work results for potential causes.     Meds ordered this encounter  Medications   losartan  (COZAAR ) 50 MG tablet    Sig: Take 1 tablet (50 mg total) by mouth daily.    Dispense:  90 tablet    Refill:  3    This prescription was filled on 03/12/2023. Any refills authorized will be placed on file.    Orders Placed This Encounter  Procedures   Lipid panel   Comprehensive metabolic panel   CBC with Differential/Platelet   T4, free   TSH   Ferritin     Follow-up: Return in about 6 months (around 01/30/2024).   I,Katherina A Bramblett,acting as a scribe for Abigail Free, MD.,have documented all relevant documentation on the behalf of Abigail Free, MD,as directed by  Abigail Free, MD while in the presence of Abigail Free, MD.   LILLETTE Kato I Leal-Borjas,acting as a scribe for Abigail Free, MD.,have documented all relevant documentation on the behalf of Abigail Free, MD,as directed by  Abigail Free, MD while in the presence of Abigail Free, MD.    An After Visit Summary was printed and given to the patient.  Abigail Free, MD [facility] [phone]

## 2023-07-30 NOTE — Assessment & Plan Note (Signed)
 Significant hair thinning with bald spots. Possible causes: stress, menopause, thyroid dysfunction. - Order blood work to check thyroid function. - Consider prescribing finasteride  for hair loss after reviewing blood work results.

## 2023-07-31 LAB — CBC WITH DIFFERENTIAL/PLATELET
Basophils Absolute: 0.1 x10E3/uL (ref 0.0–0.2)
Basos: 1 %
EOS (ABSOLUTE): 0.3 x10E3/uL (ref 0.0–0.4)
Eos: 5 %
Hematocrit: 45.1 % (ref 34.0–46.6)
Hemoglobin: 14.5 g/dL (ref 11.1–15.9)
Immature Grans (Abs): 0 x10E3/uL (ref 0.0–0.1)
Immature Granulocytes: 0 %
Lymphocytes Absolute: 2.4 x10E3/uL (ref 0.7–3.1)
Lymphs: 38 %
MCH: 28.3 pg (ref 26.6–33.0)
MCHC: 32.2 g/dL (ref 31.5–35.7)
MCV: 88 fL (ref 79–97)
Monocytes Absolute: 0.5 x10E3/uL (ref 0.1–0.9)
Monocytes: 7 %
Neutrophils Absolute: 3 x10E3/uL (ref 1.4–7.0)
Neutrophils: 49 %
Platelets: 236 x10E3/uL (ref 150–450)
RBC: 5.12 x10E6/uL (ref 3.77–5.28)
RDW: 13.4 % (ref 11.7–15.4)
WBC: 6.3 x10E3/uL (ref 3.4–10.8)

## 2023-07-31 LAB — COMPREHENSIVE METABOLIC PANEL WITH GFR
ALT: 23 IU/L (ref 0–32)
AST: 20 IU/L (ref 0–40)
Albumin: 4.5 g/dL (ref 3.8–4.9)
Alkaline Phosphatase: 89 IU/L (ref 44–121)
BUN/Creatinine Ratio: 14 (ref 9–23)
BUN: 14 mg/dL (ref 6–24)
Bilirubin Total: 0.3 mg/dL (ref 0.0–1.2)
CO2: 23 mmol/L (ref 20–29)
Calcium: 9.9 mg/dL (ref 8.7–10.2)
Chloride: 106 mmol/L (ref 96–106)
Creatinine, Ser: 0.99 mg/dL (ref 0.57–1.00)
Globulin, Total: 2.8 g/dL (ref 1.5–4.5)
Glucose: 96 mg/dL (ref 70–99)
Potassium: 4.6 mmol/L (ref 3.5–5.2)
Sodium: 144 mmol/L (ref 134–144)
Total Protein: 7.3 g/dL (ref 6.0–8.5)
eGFR: 68 mL/min/1.73 (ref >59)

## 2023-07-31 LAB — LIPID PANEL
Chol/HDL Ratio: 2.8 ratio (ref 0.0–4.4)
Cholesterol, Total: 128 mg/dL (ref 100–199)
HDL: 45 mg/dL (ref >39)
LDL Chol Calc (NIH): 66 mg/dL (ref 0–99)
Triglycerides: 86 mg/dL (ref 0–149)
VLDL Cholesterol Cal: 17 mg/dL (ref 5–40)

## 2023-07-31 LAB — T4, FREE: Free T4: 1.03 ng/dL (ref 0.82–1.77)

## 2023-07-31 LAB — FERRITIN: Ferritin: 47 ng/mL (ref 15–150)

## 2023-07-31 LAB — TSH: TSH: 3.04 u[IU]/mL (ref 0.450–4.500)

## 2023-08-01 ENCOUNTER — Ambulatory Visit: Payer: Self-pay | Admitting: Family Medicine

## 2023-08-01 DIAGNOSIS — L650 Telogen effluvium: Secondary | ICD-10-CM

## 2023-08-01 NOTE — Assessment & Plan Note (Signed)
 Continue to monitor. Management per specialist.

## 2023-08-02 MED ORDER — FINASTERIDE 1 MG PO TABS: 1.0000 mg | ORAL_TABLET | Freq: Every day | ORAL | 1 refills | Status: DC

## 2023-08-06 ENCOUNTER — Other Ambulatory Visit: Payer: Self-pay | Admitting: Family Medicine

## 2023-08-06 DIAGNOSIS — Z1231 Encounter for screening mammogram for malignant neoplasm of breast: Secondary | ICD-10-CM

## 2023-09-04 ENCOUNTER — Other Ambulatory Visit: Payer: Self-pay | Admitting: Family Medicine

## 2023-09-07 ENCOUNTER — Ambulatory Visit
Admission: RE | Admit: 2023-09-07 | Discharge: 2023-09-07 | Disposition: A | Discharge status: Home / Self Care | Source: Ambulatory Visit | Attending: Family Medicine | Admitting: Family Medicine

## 2023-09-07 DIAGNOSIS — Z1231 Encounter for screening mammogram for malignant neoplasm of breast: Secondary | ICD-10-CM

## 2023-09-12 ENCOUNTER — Ambulatory Visit: Payer: Self-pay | Admitting: Family Medicine

## 2023-10-03 ENCOUNTER — Encounter: Payer: Self-pay | Admitting: Oncology

## 2023-10-09 ENCOUNTER — Encounter: Payer: Self-pay | Admitting: Oncology

## 2023-10-10 ENCOUNTER — Ambulatory Visit (INDEPENDENT_AMBULATORY_CARE_PROVIDER_SITE_OTHER): Payer: 59 | Admitting: Obstetrics and Gynecology

## 2023-10-10 ENCOUNTER — Encounter: Payer: Self-pay | Admitting: Obstetrics and Gynecology

## 2023-10-10 VITALS — BP 130/80 | HR 88 | Ht 63.39 in | Wt 221.0 lb

## 2023-10-10 DIAGNOSIS — N83209 Unspecified ovarian cyst, unspecified side: Secondary | ICD-10-CM

## 2023-10-10 DIAGNOSIS — E2839 Other primary ovarian failure: Secondary | ICD-10-CM

## 2023-10-10 DIAGNOSIS — Z01419 Encounter for gynecological examination (general) (routine) without abnormal findings: Secondary | ICD-10-CM | POA: Diagnosis not present

## 2023-10-10 DIAGNOSIS — Z1331 Encounter for screening for depression: Secondary | ICD-10-CM | POA: Diagnosis not present

## 2023-10-10 MED ORDER — INTRAROSA 6.5 MG VA INST: 1.0000 | VAGINAL_INSERT | Freq: Every evening | VAGINAL | 12 refills | Status: AC | PRN

## 2023-10-10 MED ORDER — COMBIPATCH 0.05-0.25 MG/DAY TD PTTW: 1.0000 | MEDICATED_PATCH | TRANSDERMAL | 12 refills | Status: AC

## 2023-10-10 NOTE — Progress Notes (Signed)
 53 y.o. y.o. female here for annual exam. No LMP recorded (lmp unknown). Patient is postmenopausal.   No VB x 5 years Reports vasomotor symptoms, insomnia and hair loss  G1P1L1 Married.  Daughter is 33 yo   RP:  Established patient presenting for annual gyn exam a H/o HSC/Myosure Excision/D+C on 06/16/2020     Component 2 yr ago  SURGICAL PATHOLOGY SURGICAL PATHOLOGY CASE: [id number] PATIENT: Brenda Becker Surgical Pathology Report     Clinical History: Endometrial polyp (crm)     FINAL MICROSCOPIC DIAGNOSIS:  A. ENDOMETRIUM, POLYP, CURETTAGE: - Benign endometrial polyp - Negative for hyperplasia or malignancy         HPI:  Postmenopause.  No PMB x removal of the Endometrial Polyp which was benign.  Desires HRT 10/10/23 to begin combipatch. Has CKD and renal cancer.  They are watching the renal cancer for now. Recent ovarian cyst again on scan. To get PUS  Abstinent x 3 years now having intercourse and it is painful. To try intrarosa.  Pap Neg 05/2020.  Pap reflex today.  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 Patient has cold sores around the mouth occasionally, never had genital HSV.   No pelvic pain.  Chemotherapy for colon Cancer x 6 years.  Colonoscopy 12/2020 Neg.  Breasts normal.  Mammo Neg 9/25  Urine and bowel movements currently normal.  Body mass index Body mass index is 38.96 kg/m.'.  Health [facility] Dr Sherre Loan MD. Dxa: to get baseline with risks at [facility] Body mass index is 38.67 kg/m.     01/29/2023    9:48 AM 10/17/2022    8:22 AM 06/23/2022    8:44 AM  Depression screen PHQ 2/9  Decreased Interest 0 0 2  Down, Depressed, Hopeless 0 0 0  PHQ - 2 Score 0 0 2  Altered sleeping  0 3  Tired, decreased energy  0 3  Change in appetite  0 1  Feeling bad or failure about yourself   0 0  Trouble concentrating  0 0  Moving slowly or fidgety/restless  0 0  Suicidal thoughts  0 0  PHQ-9 Score  0 9  Difficult doing work/chores  Not difficult at  all Not difficult at all    Blood pressure 130/80, pulse 88, height 5' 3.39 (1.61 m), weight 221 lb (100.2 kg), SpO2 98%.     Component Value Date/Time   DIAGPAP  10/04/2022 1534    - Negative for intraepithelial lesion or malignancy (NILM)   DIAGPAP  08/22/2021 1532    - Negative for Intraepithelial Lesions or Malignancy (NILM)   DIAGPAP - Benign reactive/reparative changes 08/22/2021 1532   HPVHIGH Negative 10/04/2022 1534   ADEQPAP  10/04/2022 1534    Satisfactory for evaluation; transformation zone component PRESENT.   ADEQPAP  08/22/2021 1532    Satisfactory for evaluation; transformation zone component PRESENT.    GYN HISTORY:    Component Value Date/Time   DIAGPAP  10/04/2022 1534    - Negative for intraepithelial lesion or malignancy (NILM)   DIAGPAP  08/22/2021 1532    - Negative for Intraepithelial Lesions or Malignancy (NILM)   DIAGPAP - Benign reactive/reparative changes 08/22/2021 1532   HPVHIGH Negative 10/04/2022 1534   ADEQPAP  10/04/2022 1534    Satisfactory for evaluation; transformation zone component PRESENT.   ADEQPAP  08/22/2021 1532    Satisfactory for evaluation; transformation zone component PRESENT.    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)   Mixed hyperlipidemia    Myalgia due to statin 09/12/2020   Peripheral neuropathy due to chemotherapy    06-14-2020  per pt right arm to fingers   PONV (postoperative nausea and vomiting)    vomit with Gallbladder surgery   Port catheter in place 08/20/2015   Vitamin D deficiency    Wears glasses     Past Surgical History:  Procedure Laterality Date   BREAST BIOPSY Right 2019   per pt benign   COLON SURGERY  11/16/2014   @[facility] ---  partial transverse colectomy w/ node dissection   COLONOSCOPY  10/19/2014   Colonic mass (biopsied and tattooed) Small internal hemorrhoids.   DILATATION & CURETTAGE/HYSTEROSCOPY WITH MYOSURE N/A 06/16/2020   Procedure: DILATATION & CURETTAGE/HYSTEROSCOPY WITH MYOSURE EXCISION;  Surgeon: Lavoie, Marie-Lyne, MD;  Location: [facility];  Service: Gynecology;  Laterality: N/A;   EUS N/A 02/17/2016   Procedure: UPPER ENDOSCOPIC ULTRASOUND (EUS) RADIAL;  Surgeon: Toribio SHAUNNA Cedar, MD;  Location: [facility];  Service: Endoscopy;  Laterality: N/A;   EUS N/A 07/20/2016   Procedure: UPPER ENDOSCOPIC ULTRASOUND (EUS) RADIAL;  Surgeon: Cedar Toribio SHAUNNA, MD;  Location: [facility];  Service: Endoscopy;  Laterality: N/A;   KNEE ARTHROSCOPY Right 2012   LAPAROSCOPIC CHOLECYSTECTOMY  2003   PORTA CATH INSERTION  12/22/2014   TONSILLECTOMY  1994    Current Outpatient Medications on File Prior to Visit  Medication Sig Dispense Refill   finasteride  (PROPECIA ) 1 MG tablet Take 1 tablet (1 mg total) by mouth daily. 90 tablet 1   losartan  (COZAAR ) 50 MG tablet Take 1 tablet (50 mg total) by mouth daily. 90 tablet 3   Omega-3 Fatty Acids (FISH OIL PO) Take 1,080 mg by mouth at bedtime.     omeprazole  (PRILOSEC) 40 MG capsule Take 1 capsule (40 mg total) by mouth daily. 90 capsule 1   REPATHA   SURECLICK 140 MG/ML SOAJ INJECT 140 MG SUBCUTANEOUSLY EVERY 14 DAYS 4 mL 0   Turmeric 500 MG TABS Take 2 tablets by mouth daily.     No current facility-administered medications on file prior to visit.    Social History   Socioeconomic History   Marital status: Married    Spouse name: Not on file   Number of children: 1   Years of education: Not on file   Highest education level: Not on file  Occupational History   Occupation: Production designer, theatre/television/film    Comment:  [employer]  Tobacco Use   Smoking status: Never   Smokeless tobacco: Never  Vaping Use   Vaping status: Never Used  Substance and Sexual Activity   Alcohol use: No   Drug use: Never   Sexual activity: Yes    Partners: Male  Other Topics Concern   Not on file  Social History Narrative   Not on file   Social Drivers of Health   Financial Resource Strain: Low Risk  (06/23/2022)   Overall Financial Resource Strain (CARDIA)    Difficulty of Paying Living Expenses: Not hard at all  Food Insecurity: No Food Insecurity (07/30/2023)   Hunger Vital Sign    Worried About Running Out of Food in the Last Year: Never true    Ran Out of Food in the Last Year: Never true  Transportation Needs: No Transportation Needs (07/30/2023)   PRAPARE - Administrator, Civil Service (Medical): No    Lack of Transportation (Non-Medical): No  Physical Activity: Insufficiently Active (06/23/2022)   Exercise Vital Sign    Days of Exercise per Week: 4 days    Minutes of Exercise per Session: 30 min  Stress: No Stress Concern Present (06/23/2022)   [employer] of Occupational Health - Occupational Stress Questionnaire    Feeling of Stress : Not at all  Social Connections: Socially Integrated (06/23/2022)   Social Connection and Isolation Panel    Frequency of Communication with Friends and Family: More than three times a week    Frequency of Social Gatherings with Friends and Family: Three times a week    Attends Religious Services:  More than 4 times per year    Active Member of Clubs or Organizations: No    Attends Banker Meetings: More than 4 times per year    Marital Status: Married  Catering manager Violence: Not At Risk (07/30/2023)   Humiliation, Afraid, Rape, and Kick questionnaire    Fear of Current or Ex-Partner: No    Emotionally Abused: No    Physically Abused: No    Sexually Abused: No    Family History  Problem Relation Age of Onset   Other Father        doesn't go to doctor or get colonoscopies   Other Maternal Aunt        won't go to doctor; probably has not had colonoscopy   Cervical cancer Paternal Aunt 40       treated at [facility]   Throat cancer Paternal Aunt 33       smoker   Skin cancer Maternal Grandmother        non-melanoma; dx. late 60s   Prostate cancer Maternal Grandfather        dx. late 45s   Breast cancer Paternal Grandmother 48 - 12   Stomach cancer Cousin 11 - 41   Other Brother        stomach issues; has had early colonoscopy   Stomach cancer Other        dx. older   Colon cancer Other        dx. 60s   Breast cancer Other 50 - 69   Ulcerative colitis Neg Hx      Allergies  Allergen Reactions   Crestor  [Rosuvastatin  Calcium ] Other (See Comments)    Muscle pain   Lipitor [Atorvastatin Calcium ]     Elevated LFTs    Zetia  [Ezetimibe ]     Malaise.      Patient's last menstrual period was  No LMP recorded (lmp unknown). Patient is postmenopausal..            Review of Systems Alls systems reviewed and are negative.     Physical Exam Constitutional:      Appearance: Normal appearance.  Genitourinary:     Vulva and urethral meatus normal.     No lesions in the vagina.     Right Labia: No rash, lesions or skin changes.    Left Labia: No lesions, skin changes or rash.    No vaginal discharge or tenderness.     No vaginal prolapse present.    No vaginal atrophy present.     Right Adnexa: not tender, not palpable and no mass present.    Left  Adnexa: not tender, not palpable and no mass present.    No cervical motion tenderness or discharge.     Uterus is not enlarged, tender or irregular.  Breasts:    Right: Normal.     Left: Normal.  HENT:     Head: Normocephalic.  Neck:     Thyroid: No thyroid mass, thyromegaly or thyroid tenderness.  Cardiovascular:     Rate and Rhythm: Normal rate and regular rhythm.     Heart sounds: Normal heart sounds, S1 normal and S2 normal.  Pulmonary:     Effort: Pulmonary effort is normal.     Breath sounds: Normal breath sounds and air entry.  Abdominal:     General: There is no distension.     Palpations: Abdomen is soft. There is no mass.     Tenderness: There is no abdominal tenderness. There is no guarding or rebound.  Musculoskeletal:        General: Normal range of motion.     Cervical back: Full passive range of motion without pain, normal range of motion and neck supple. No tenderness.     Right lower leg: No edema.     Left lower leg: No edema.  Neurological:     Mental Status: She is alert.  Skin:    General: Skin is warm.  Psychiatric:        Mood and Affect: Mood normal.        Behavior: Behavior normal.        Thought Content: Thought content normal.  Vitals and nursing note reviewed. Exam conducted with a chaperone present.       A:         Well Woman GYN exam                            Blood pressure 130/80, pulse 88, height 5' 3.39 (1.61 m), weight 221 lb (100.2 kg), SpO2 98%.  P:        Pap smear not indicated Encouraged annual mammogram screening Colon cancer screening up-to-date DXA ordered today Labs and immunizations to do with PMD Discussed breast self exams Encouraged healthy lifestyle practices Encouraged Vit D and Calcium   Counseled on the r/b/a/I of HRT use.  Discussed that she will need progesterone to avoid unopposed estrogen on the endometrial lining and risk for endometrial cancer. Discussed lower risk for DVT and stroke with the  patch. Side effects include risk of breast tenderness and spotting along with low risk of blood clots and stroke with uncontrolled hypertension. Counseled on the benefits to help improve the bone density during menopause. Intrarosa sent for dyspareunia and vaginal dryness. No follow-ups on file.  Almarie MARLA Carpen

## 2023-10-19 ENCOUNTER — Ambulatory Visit (INDEPENDENT_AMBULATORY_CARE_PROVIDER_SITE_OTHER)
Admission: RE | Admit: 2023-10-19 | Discharge: 2023-10-19 | Disposition: A | Discharge status: Home / Self Care | Source: Ambulatory Visit | Attending: Obstetrics and Gynecology | Admitting: Obstetrics and Gynecology

## 2023-10-19 ENCOUNTER — Ambulatory Visit: Payer: Self-pay | Admitting: Obstetrics and Gynecology

## 2023-10-19 DIAGNOSIS — E2839 Other primary ovarian failure: Secondary | ICD-10-CM | POA: Diagnosis not present

## 2023-10-19 DIAGNOSIS — Z01419 Encounter for gynecological examination (general) (routine) without abnormal findings: Secondary | ICD-10-CM

## 2023-10-28 ENCOUNTER — Other Ambulatory Visit: Payer: Self-pay | Admitting: Family Medicine

## 2023-11-24 ENCOUNTER — Other Ambulatory Visit: Payer: Self-pay | Admitting: Family Medicine

## 2024-01-19 ENCOUNTER — Other Ambulatory Visit: Payer: Self-pay | Admitting: Family Medicine

## 2024-01-19 DIAGNOSIS — L650 Telogen effluvium: Secondary | ICD-10-CM

## 2024-01-31 ENCOUNTER — Ambulatory Visit: Admitting: Family Medicine

## 2024-01-31 ENCOUNTER — Encounter: Payer: Self-pay | Admitting: Family Medicine

## 2024-01-31 VITALS — BP 132/80 | HR 80 | Temp 97.6°F | Ht 63.0 in | Wt 225.0 lb

## 2024-01-31 DIAGNOSIS — E66812 Obesity, class 2: Secondary | ICD-10-CM

## 2024-01-31 DIAGNOSIS — C641 Malignant neoplasm of right kidney, except renal pelvis: Secondary | ICD-10-CM

## 2024-01-31 DIAGNOSIS — Z6839 Body mass index (BMI) 39.0-39.9, adult: Secondary | ICD-10-CM

## 2024-01-31 DIAGNOSIS — C184 Malignant neoplasm of transverse colon: Secondary | ICD-10-CM | POA: Diagnosis not present

## 2024-01-31 DIAGNOSIS — L650 Telogen effluvium: Secondary | ICD-10-CM | POA: Diagnosis not present

## 2024-01-31 DIAGNOSIS — E6609 Other obesity due to excess calories: Secondary | ICD-10-CM

## 2024-01-31 DIAGNOSIS — C772 Secondary and unspecified malignant neoplasm of intra-abdominal lymph nodes: Secondary | ICD-10-CM | POA: Diagnosis not present

## 2024-01-31 DIAGNOSIS — E782 Mixed hyperlipidemia: Secondary | ICD-10-CM | POA: Diagnosis not present

## 2024-01-31 DIAGNOSIS — C189 Malignant neoplasm of colon, unspecified: Secondary | ICD-10-CM | POA: Diagnosis not present

## 2024-01-31 DIAGNOSIS — I129 Hypertensive chronic kidney disease with stage 1 through stage 4 chronic kidney disease, or unspecified chronic kidney disease: Secondary | ICD-10-CM | POA: Diagnosis not present

## 2024-01-31 LAB — CBC WITH DIFFERENTIAL/PLATELET
Basophils Absolute: 0.1 10*3/uL (ref 0.0–0.2)
Basos: 1 %
EOS (ABSOLUTE): 0.2 10*3/uL (ref 0.0–0.4)
Eos: 3 %
Hematocrit: 46.9 % — ABNORMAL HIGH (ref 34.0–46.6)
Hemoglobin: 14.8 g/dL (ref 11.1–15.9)
Immature Grans (Abs): 0 10*3/uL (ref 0.0–0.1)
Immature Granulocytes: 0 %
Lymphocytes Absolute: 2.5 10*3/uL (ref 0.7–3.1)
Lymphs: 32 %
MCH: 27 pg (ref 26.6–33.0)
MCHC: 31.6 g/dL (ref 31.5–35.7)
MCV: 86 fL (ref 79–97)
Monocytes Absolute: 0.6 10*3/uL (ref 0.1–0.9)
Monocytes: 7 %
Neutrophils Absolute: 4.2 10*3/uL (ref 1.4–7.0)
Neutrophils: 57 %
Platelets: 269 10*3/uL (ref 150–450)
RBC: 5.48 x10E6/uL — ABNORMAL HIGH (ref 3.77–5.28)
RDW: 13.7 % (ref 11.7–15.4)
WBC: 7.6 10*3/uL (ref 3.4–10.8)

## 2024-01-31 LAB — COMPREHENSIVE METABOLIC PANEL WITH GFR
ALT: 19 [IU]/L (ref 0–32)
AST: 20 [IU]/L (ref 0–40)
Albumin: 4.6 g/dL (ref 3.8–4.9)
Alkaline Phosphatase: 78 [IU]/L (ref 49–135)
BUN/Creatinine Ratio: 10 (ref 9–23)
BUN: 12 mg/dL (ref 6–24)
Bilirubin Total: 0.5 mg/dL (ref 0.0–1.2)
CO2: 22 mmol/L (ref 20–29)
Calcium: 9.9 mg/dL (ref 8.7–10.2)
Chloride: 106 mmol/L (ref 96–106)
Creatinine, Ser: 1.25 mg/dL — ABNORMAL HIGH (ref 0.57–1.00)
Globulin, Total: 2.7 g/dL (ref 1.5–4.5)
Glucose: 99 mg/dL (ref 70–99)
Potassium: 5 mmol/L (ref 3.5–5.2)
Sodium: 143 mmol/L (ref 134–144)
Total Protein: 7.3 g/dL (ref 6.0–8.5)
eGFR: 52 mL/min/{1.73_m2} — ABNORMAL LOW

## 2024-01-31 LAB — POCT LIPID PANEL
HDL: 26
LDL: 93
Non-HDL: 111
TC: 137
TRG: 90

## 2024-01-31 MED ORDER — WEGOVY 9 MG PO TABS: 9.0000 mg | ORAL_TABLET | Freq: Every day | ORAL | 0 refills | Status: AC

## 2024-01-31 MED ORDER — WEGOVY 4 MG PO TABS: 4.0000 mg | ORAL_TABLET | Freq: Every day | ORAL | 0 refills | Status: AC

## 2024-01-31 MED ORDER — WEGOVY 1.5 MG PO TABS: 1.5000 mg | ORAL_TABLET | Freq: Every day | ORAL | 0 refills | Status: AC

## 2024-01-31 NOTE — Assessment & Plan Note (Addendum)
 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 - Continue surveillance of right kidney neoplasm.

## 2024-01-31 NOTE — Assessment & Plan Note (Addendum)
 Management per specialist.

## 2024-01-31 NOTE — Assessment & Plan Note (Addendum)
 Managed with Repatha . Discussed dietary modifications to improve cholesterol levels. - Continue Repatha  for cholesterol management. - Encouraged dietary modifications to improve cholesterol levels. Orders:   POCT Lipid Panel   CBC with Differential/Platelet   Comprehensive metabolic panel with GFR

## 2024-01-31 NOTE — Progress Notes (Unsigned)
 "  Subjective:  Patient ID: Brenda Becker, female    DOB: 1970/06/22  Age: 54 y.o. MRN: [id number]  Chief Complaint  Patient presents with   Medical Management of Chronic Issues    HPI: Discussed the use of AI scribe software for clinical note transcription with the patient, who gave verbal consent to proceed.  History of Present Illness Brenda Becker is a 54 year old female who presents for a follow-up visit.  Allergic rhinitis symptoms - Frequent sneezing and rhinorrhea - Symptoms attributed to allergies, possibly related to cat exposure - No allergy medication taken for approximately one week due to running out - No cough or dyspnea  Abdominal pain - Experienced upper abdominal pain last week, lasting three to four days - Pain has resolved - Uncertain if pain was related to constipation or fatigue - No current bladder symptoms, nausea, vomiting, or skin problems  Oncologic history and surveillance - History of right renal low-grade oncocytic neoplasm, biopsy-proven - Ongoing surveillance for renal neoplasm - History of stage four transverse colon cancer, currently stable - Colonoscopy due in 2028  Lifestyle and physical activity - Actively maintaining a healthy lifestyle with healthy eating and regular exercise - Works in a warehouse with significant walking - Incorporating more physical activity into daily routine - Mindful of diet, reducing high-calorie foods and drinks, increasing fruits and vegetables  Medication effects and hair changes - Currently taking Repatha  for hyperlipidemia, finasteride  for alopecia, over-the-counter fish oil, omeprazole  for gastroesophageal reflux disease, and losartan  for hypertension - Hair regrowth observed, though hair remains thin  Neuropsychiatric and general symptoms - No anxiety, depression, or dizziness - Occasional headaches       01/31/2024    8:02 AM 01/29/2023    9:48 AM 10/17/2022    8:22 AM 06/23/2022    8:44 AM  02/21/2022    9:08 AM  Depression screen PHQ 2/9  Decreased Interest 0 0 0 2 0  Down, Depressed, Hopeless 0 0 0 0 0  PHQ - 2 Score 0 0 0 2 0  Altered sleeping 0  0 3   Tired, decreased energy 0  0 3   Change in appetite 0  0 1   Feeling bad or failure about yourself  0  0 0   Trouble concentrating 0  0 0   Moving slowly or fidgety/restless 0  0 0   Suicidal thoughts 0  0 0   PHQ-9 Score 0  0  9    Difficult doing work/chores Not difficult at all  Not difficult at all Not difficult at all      Data saved with a previous flowsheet row definition        10/04/2022    3:02 PM  Fall Risk   Falls in the past year? 0  Number falls in past yr: 0  Injury with Fall? 0   Risk for fall due to : No Fall Risks  Follow up Falls evaluation completed     Data saved with a previous flowsheet row definition    Patient Care Team: Sherre Clapper, MD as PCP - General (Family Medicine)   Review of Systems  Constitutional:  Negative for chills, fatigue and fever.  HENT:  Positive for rhinorrhea and sneezing. Negative for congestion, ear pain and sore throat.   Respiratory:  Negative for cough and shortness of breath.   Cardiovascular:  Negative for chest pain.  Gastrointestinal:  Positive for abdominal pain (LUQ  for about 4  day last week.). Negative for constipation, diarrhea, nausea and vomiting.  Genitourinary:  Negative for dysuria and urgency.  Musculoskeletal:  Negative for arthralgias and myalgias.  Skin:  Negative for rash.  Neurological:  Negative for dizziness and headaches.  Psychiatric/Behavioral:  Negative for dysphoric mood. The patient is not nervous/anxious.     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 gayle [facility])   Mixed hyperlipidemia    Myalgia due to statin 09/12/2020   Peripheral neuropathy due to chemotherapy    06-14-2020  per pt right arm to fingers   PONV (postoperative nausea and vomiting)    vomit with Gallbladder surgery   Port catheter in place 08/20/2015   Vitamin D deficiency    Wears glasses    Past Surgical History:  Procedure Laterality Date   BREAST BIOPSY Right 2019   per pt benign   COLON SURGERY  11/16/2014   @[facility] ---  partial transverse colectomy w/ node dissection   COLONOSCOPY  10/19/2014   Colonic mass (biopsied and tattooed) Small internal hemorrhoids.   DILATATION & CURETTAGE/HYSTEROSCOPY WITH MYOSURE N/A 06/16/2020   Procedure: DILATATION & CURETTAGE/HYSTEROSCOPY WITH MYOSURE EXCISION;  Surgeon: Lavoie, Marie-Lyne, MD;  Location: [facility];  Service: Gynecology;  Laterality: N/A;   EUS N/A 02/17/2016   Procedure: UPPER ENDOSCOPIC ULTRASOUND (EUS) RADIAL;  Surgeon: Toribio SHAUNNA Cedar, MD;  Location: [facility];  Service: Endoscopy;  Laterality: N/A;   EUS N/A 07/20/2016   Procedure: UPPER ENDOSCOPIC ULTRASOUND (EUS) RADIAL;  Surgeon: Cedar Toribio SHAUNNA, MD;  Location: [facility];  Service: Endoscopy;  Laterality: N/A;   KNEE ARTHROSCOPY Right 2012   LAPAROSCOPIC CHOLECYSTECTOMY  2003   PORTA CATH INSERTION  12/22/2014   TONSILLECTOMY  1994    Family History  Problem Relation Age of Onset   Other Father        doesn't go to doctor or get colonoscopies   Other Maternal Aunt        won't go to  doctor; probably has not had colonoscopy   Cervical cancer Paternal Aunt 34       treated at [facility]   Throat cancer Paternal Aunt 37       smoker   Skin cancer Maternal Grandmother        non-melanoma; dx. late 60s   Prostate cancer Maternal Grandfather        dx. late 70s   Breast cancer Paternal Grandmother 25 - 41   Stomach cancer Cousin 35 - 28   Other Brother        stomach issues; has had early colonoscopy   Stomach cancer Other        dx. older   Colon cancer Other        dx. 85s  Breast cancer Other 50 - 69   Ulcerative colitis Neg Hx    Social History   Socioeconomic History   Marital status: Married    Spouse name: Not on file   Number of children: 1   Years of education: Not on file   Highest education level: Not on file  Occupational History   Occupation: Production Designer, Theatre/television/film    Comment: Airline Pilot  Tobacco Use   Smoking status: Never   Smokeless tobacco: Never  Vaping Use   Vaping status: Never Used  Substance and Sexual Activity   Alcohol use: No   Drug use: Never   Sexual activity: Yes    Partners: Male  Other Topics Concern   Not on file  Social History Narrative   Not on file   Social Drivers of Health   Tobacco Use: Low Risk (01/31/2024)   Patient History    Smoking Tobacco Use: Never    Smokeless Tobacco Use: Never    Passive Exposure: Not on file  Financial Resource Strain: Low Risk (06/23/2022)   Overall Financial Resource Strain (CARDIA)    Difficulty of Paying Living Expenses: Not hard at all  Food Insecurity: No Food Insecurity (07/30/2023)   Epic    Worried About Radiation Protection Practitioner of Food in the Last Year: Never true    Ran Out of Food in the Last Year: Never true  Transportation Needs: No Transportation Needs (07/30/2023)   Epic    Lack of Transportation (Medical): No    Lack of Transportation (Non-Medical): No  Physical Activity: Insufficiently Active (06/23/2022)   Exercise Vital Sign    Days of Exercise per Week: 4 days    Minutes of  Exercise per Session: 30 min  Stress: No Stress Concern Present (06/23/2022)   [employer] of Occupational Health - Occupational Stress Questionnaire    Feeling of Stress : Not at all  Social Connections: Socially Integrated (06/23/2022)   Social Connection and Isolation Panel    Frequency of Communication with Friends and Family: More than three times a week    Frequency of Social Gatherings with Friends and Family: Three times a week    Attends Religious Services: More than 4 times per year    Active Member of Clubs or Organizations: No    Attends Banker Meetings: More than 4 times per year    Marital Status: Married  Depression (PHQ2-9): Low Risk (01/31/2024)   Depression (PHQ2-9)    PHQ-2 Score: 0  Alcohol Screen: Low Risk (06/23/2022)   Alcohol Screen    Last Alcohol Screening Score (AUDIT): 0  Housing: Low Risk (07/30/2023)   Epic    Unable to Pay for Housing in the Last Year: No    Number of Times Moved in the Last Year: 0    Homeless in the Last Year: No  Utilities: Not At Risk (07/30/2023)   Epic    Threatened with loss of utilities: No  Health Literacy: Adequate Health Literacy (10/17/2022)   B1300 Health Literacy    Frequency of need for help with medical instructions: Never    Objective:  BP 132/80   Pulse 80   Temp 97.6 F (36.4 C)   Ht 5' 3 (1.6 m)   Wt 225 lb (102.1 kg)   LMP  (LMP Unknown)   SpO2 97%   BMI 39.86 kg/m      01/31/2024    8:00 AM 10/10/2023    3:06 PM 07/30/2023    8:55 AM  BP/Weight  Systolic BP 132 130 132  Diastolic BP 80 80 78  Wt. (Lbs) 225 221 213  BMI 39.86 kg/m2 38.67 kg/m2 35.45 kg/m2    Physical Exam Vitals reviewed.  Constitutional:      Appearance: Normal appearance. She is obese.  Neck:     Vascular: No carotid bruit.  Cardiovascular:     Rate and Rhythm: Normal rate and regular rhythm.     Heart sounds: Normal heart sounds.  Pulmonary:     Effort: Pulmonary effort is normal. No respiratory  distress.     Breath sounds: Normal breath sounds.  Abdominal:     General: Abdomen is flat. Bowel sounds are normal.     Palpations: Abdomen is soft.     Tenderness: There is no abdominal tenderness.  Neurological:     Mental Status: She is alert and oriented to person, place, and time.  Psychiatric:        Mood and Affect: Mood normal.        Behavior: Behavior normal.     {Perform Simple Foot Exam  Perform Detailed exam:1} {Insert foot Exam (Optional):30965}   Lab Results  Component Value Date   WBC 7.6 01/31/2024   HGB 14.8 01/31/2024   HCT 46.9 (H) 01/31/2024   PLT 269 01/31/2024   GLUCOSE 99 01/31/2024   CHOL 128 07/30/2023   TRIG 86 07/30/2023   HDL 45 07/30/2023   LDLCALC 66 07/30/2023   ALT 19 01/31/2024   AST 20 01/31/2024   NA 143 01/31/2024   K 5.0 01/31/2024   CL 106 01/31/2024   CREATININE 1.25 (H) 01/31/2024   BUN 12 01/31/2024   CO2 22 01/31/2024   TSH 3.040 07/30/2023   INR 0.94 12/22/2014    Results for orders placed or performed in visit on 01/31/24  POCT Lipid Panel   Collection Time: 01/31/24  8:16 AM  Result Value Ref Range   TC 137    HDL 26    TRG 90    LDL 93    Non-HDL 111    TC/HDL    CBC with Differential/Platelet   Collection Time: 01/31/24  8:46 AM  Result Value Ref Range   WBC 7.6 3.4 - 10.8 x10E3/uL   RBC 5.48 (H) 3.77 - 5.28 x10E6/uL   Hemoglobin 14.8 11.1 - 15.9 g/dL   Hematocrit 53.0 (H) 65.9 - 46.6 %   MCV 86 79 - 97 fL   MCH 27.0 26.6 - 33.0 pg   MCHC 31.6 31.5 - 35.7 g/dL   RDW 86.2 88.2 - 84.5 %   Platelets 269 150 - 450 x10E3/uL   Neutrophils 57 Not Estab. %   Lymphs 32 Not Estab. %   Monocytes 7 Not Estab. %   Eos 3 Not Estab. %   Basos 1 Not Estab. %   Neutrophils Absolute 4.2 1.4 - 7.0 x10E3/uL   Lymphocytes Absolute 2.5 0.7 - 3.1 x10E3/uL   Monocytes Absolute 0.6 0.1 - 0.9 x10E3/uL   EOS (ABSOLUTE) 0.2 0.0 - 0.4 x10E3/uL   Basophils Absolute 0.1 0.0 - 0.2 x10E3/uL   Immature Granulocytes 0 Not Estab.  %   Immature Grans (Abs) 0.0 0.0 - 0.1 x10E3/uL  Comprehensive metabolic panel with GFR   Collection Time: 01/31/24  8:46 AM  Result Value Ref Range   Glucose 99 70 - 99 mg/dL   BUN 12 6 - 24 mg/dL   Creatinine, Ser 8.74 (H) 0.57 - 1.00 mg/dL   eGFR 52 (L) >40 fO/fpw/8.26  BUN/Creatinine Ratio 10 9 - 23   Sodium 143 134 - 144 mmol/L   Potassium 5.0 3.5 - 5.2 mmol/L   Chloride 106 96 - 106 mmol/L   CO2 22 20 - 29 mmol/L   Calcium  9.9 8.7 - 10.2 mg/dL   Total Protein 7.3 6.0 - 8.5 g/dL   Albumin 4.6 3.8 - 4.9 g/dL   Globulin, Total 2.7 1.5 - 4.5 g/dL   Bilirubin Total 0.5 0.0 - 1.2 mg/dL   Alkaline Phosphatase 78 49 - 135 IU/L   AST 20 0 - 40 IU/L   ALT 19 0 - 32 IU/L  .  Assessment & Plan:   Assessment & Plan Mixed hyperlipidemia Managed with Repatha . Discussed dietary modifications to improve cholesterol levels. - Continue Repatha  for cholesterol management. - Encouraged dietary modifications to improve cholesterol levels. Orders:   POCT Lipid Panel   CBC with Differential/Platelet   Comprehensive metabolic panel with GFR   Hypertensive renal disease Blood pressure well-controlled with losartan . - Continue losartan  for blood pressure management.    Renal cancer, right (HCC) Low-grade oncocytic neoplasm of the right kidney, biopsy-proven, under surveillance due to slow growth. - Continue surveillance of right kidney neoplasm.    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     Malignant neoplasm of transverse colon (HCC) Stage IV transverse colon cancer, currently well-managed. - Continue surveillance and monitoring of colon cancer. - Plan for colonoscopy in 2028.    Telogen effluvium Hair loss managed with finasteride  and hormone patches, with reported improvement. - Continue finasteride  and hormone patches for hair loss management.    Class 2 obesity due to excess calories without serious comorbidity with body  mass index (BMI) of 39.0 to 39.9 in adult Discussed weight management strategies and Wegovy  for weight loss, including side effects and insurance limitations. - Prescribed Wegovy , starting with 1.5 mg dose, increasing to 4 mg based on tolerance and effectiveness. - Sent prescription to [facility] for [facility] and pricing. - Scheduled follow-up in 3 months to assess progress.     Body mass index is 39.86 kg/m.    Meds ordered this encounter  Medications   semaglutide -weight management (WEGOVY ) 1.5 MG tablet    Sig: Take 1 tablet (1.5 mg total) by mouth daily. Daily in AM on an empty stomach with 4 oz of water. Do not eat or drink for 30 minutes after dose.    Dispense:  30 tablet    Refill:  0   semaglutide -weight management (WEGOVY ) 4 MG tablet    Sig: Take 1 tablet (4 mg total) by mouth daily. Daily in morning on an empty stomach with 4 oz of water. Do not eat or drink for 30 minutes after dose.    Dispense:  30 tablet    Refill:  0   semaglutide -weight management (WEGOVY ) 9 MG tablet    Sig: Take 1 tablet (9 mg total) by mouth daily. Daily in the morning on an empty stomach with 4 oz of water. Do not eat or drink for 30 minutes after dose.    Dispense:  30 tablet    Refill:  0    Orders Placed This Encounter  Procedures   CBC with Differential/Platelet   Comprehensive metabolic panel with GFR   POCT Lipid Panel     I,Marla I Leal-Borjas,acting as a scribe for Abigail Free, MD.,have documented all relevant documentation on the behalf of Abigail Free, MD,as directed by  Abigail Free, MD while in  the presence of Abigail Free, MD.   Follow-up: Return in about 3 months (around 04/30/2024) for chronic follow up.  An After Visit Summary was printed and given to the patient.  Abigail Free, MD [facility] [phone]     [1]  Current Outpatient Medications on File Prior to Visit  Medication Sig Dispense Refill   estradiol-norethindrone (COMBIPATCH ) 0.05-0.25  MG/DAY Place 1 patch onto the skin 2 (two) times a week. 8 patch 12   Evolocumab  (REPATHA  SURECLICK) 140 MG/ML SOAJ INJECT 140 MG SUBCUNTANEOUSLY EVERY 14 DAYS 6 mL 3   finasteride  (PROPECIA ) 1 MG tablet Take 1 tablet (1 mg total) by mouth daily. 90 tablet 1   losartan  (COZAAR ) 50 MG tablet Take 1 tablet (50 mg total) by mouth daily. 90 tablet 3   Omega-3 Fatty Acids (FISH OIL PO) Take 1,080 mg by mouth at bedtime.     omeprazole  (PRILOSEC) 40 MG capsule Take 1 capsule (40 mg total) by mouth daily. 90 capsule 1   Prasterone  (INTRAROSA ) 6.5 MG INST Place 1 suppository vaginally at bedtime as needed. 30 each 12   Turmeric 500 MG TABS Take 2 tablets by mouth daily.     No current facility-administered medications on file prior to visit.   "

## 2024-01-31 NOTE — Assessment & Plan Note (Addendum)
 Stage IV transverse colon cancer, currently well-managed. - Continue surveillance and monitoring of colon cancer. - Plan for colonoscopy in 2028.

## 2024-01-31 NOTE — Assessment & Plan Note (Addendum)
 Blood pressure well-controlled with losartan . - Continue losartan  for blood pressure management.

## 2024-02-01 ENCOUNTER — Ambulatory Visit: Payer: Self-pay | Admitting: Family Medicine

## 2024-02-01 DIAGNOSIS — E6609 Other obesity due to excess calories: Secondary | ICD-10-CM | POA: Insufficient documentation

## 2024-02-01 NOTE — Assessment & Plan Note (Signed)
 Discussed weight management strategies and Wegovy  for weight loss, including side effects and insurance limitations. - Prescribed Wegovy , starting with 1.5 mg dose, increasing to 4 mg based on tolerance and effectiveness. - Sent prescription to [facility] for [facility] and pricing. - Scheduled follow-up in 3 months to assess progress.

## 2024-02-01 NOTE — Assessment & Plan Note (Signed)
 Hair loss managed with finasteride  and hormone patches, with reported improvement. - Continue finasteride  and hormone patches for hair loss management.

## 2024-05-06 ENCOUNTER — Ambulatory Visit: Admitting: Family Medicine

## 2024-10-15 ENCOUNTER — Ambulatory Visit: Admitting: Obstetrics and Gynecology
